# Patient Record
Sex: Female | Born: 1970 | Race: White | Hispanic: No | Smoking: Light tobacco smoker
Health system: Southern US, Community
[De-identification: ages and names within clinical notes are randomized; demographics above are authoritative.]

## PROBLEM LIST (undated history)

## (undated) DIAGNOSIS — Z800 Family history of malignant neoplasm of digestive organs: Secondary | ICD-10-CM

## (undated) DIAGNOSIS — F32A Depression, unspecified: Secondary | ICD-10-CM

## (undated) DIAGNOSIS — F419 Anxiety disorder, unspecified: Secondary | ICD-10-CM

## (undated) DIAGNOSIS — K589 Irritable bowel syndrome without diarrhea: Secondary | ICD-10-CM

## (undated) DIAGNOSIS — T753XXA Motion sickness, initial encounter: Secondary | ICD-10-CM

## (undated) DIAGNOSIS — K219 Gastro-esophageal reflux disease without esophagitis: Secondary | ICD-10-CM

## (undated) DIAGNOSIS — K602 Anal fissure, unspecified: Secondary | ICD-10-CM

## (undated) DIAGNOSIS — M797 Fibromyalgia: Secondary | ICD-10-CM

## (undated) DIAGNOSIS — Z923 Personal history of irradiation: Secondary | ICD-10-CM

## (undated) DIAGNOSIS — B373 Candidiasis of vulva and vagina: Secondary | ICD-10-CM

## (undated) DIAGNOSIS — Z808 Family history of malignant neoplasm of other organs or systems: Secondary | ICD-10-CM

## (undated) DIAGNOSIS — M1990 Unspecified osteoarthritis, unspecified site: Secondary | ICD-10-CM

## (undated) DIAGNOSIS — F329 Major depressive disorder, single episode, unspecified: Secondary | ICD-10-CM

## (undated) DIAGNOSIS — K8020 Calculus of gallbladder without cholecystitis without obstruction: Secondary | ICD-10-CM

## (undated) DIAGNOSIS — B3731 Acute candidiasis of vulva and vagina: Secondary | ICD-10-CM

## (undated) DIAGNOSIS — C50919 Malignant neoplasm of unspecified site of unspecified female breast: Secondary | ICD-10-CM

## (undated) HISTORY — DX: Anal fissure, unspecified: K60.2

## (undated) HISTORY — PX: ABDOMINAL HYSTERECTOMY: SHX81

## (undated) HISTORY — DX: Fibromyalgia: M79.7

## (undated) HISTORY — DX: Candidiasis of vulva and vagina: B37.3

## (undated) HISTORY — DX: Irritable bowel syndrome, unspecified: K58.9

## (undated) HISTORY — DX: Depression, unspecified: F32.A

## (undated) HISTORY — DX: Family history of malignant neoplasm of other organs or systems: Z80.8

## (undated) HISTORY — DX: Acute candidiasis of vulva and vagina: B37.31

## (undated) HISTORY — DX: Calculus of gallbladder without cholecystitis without obstruction: K80.20

## (undated) HISTORY — DX: Family history of malignant neoplasm of digestive organs: Z80.0

## (undated) HISTORY — DX: Malignant neoplasm of unspecified site of unspecified female breast: C50.919

## (undated) HISTORY — PX: BREAST BIOPSY: SHX20

## (undated) HISTORY — DX: Major depressive disorder, single episode, unspecified: F32.9

## (undated) HISTORY — PX: COSMETIC SURGERY: SHX468

## (undated) HISTORY — DX: Anxiety disorder, unspecified: F41.9

---

## 1998-04-23 HISTORY — PX: CHOLECYSTECTOMY: SHX55

## 2000-04-23 DIAGNOSIS — C50919 Malignant neoplasm of unspecified site of unspecified female breast: Secondary | ICD-10-CM

## 2000-04-23 HISTORY — PX: BREAST BIOPSY: SHX20

## 2000-04-23 HISTORY — DX: Malignant neoplasm of unspecified site of unspecified female breast: C50.919

## 2001-04-23 HISTORY — PX: BREAST LUMPECTOMY: SHX2

## 2008-12-03 DIAGNOSIS — G43909 Migraine, unspecified, not intractable, without status migrainosus: Secondary | ICD-10-CM | POA: Insufficient documentation

## 2009-04-22 DIAGNOSIS — E559 Vitamin D deficiency, unspecified: Secondary | ICD-10-CM | POA: Insufficient documentation

## 2010-06-29 ENCOUNTER — Ambulatory Visit: Payer: Self-pay | Admitting: Oncology

## 2010-07-23 ENCOUNTER — Ambulatory Visit: Payer: Self-pay | Admitting: Oncology

## 2010-11-07 ENCOUNTER — Ambulatory Visit: Payer: Self-pay | Admitting: Family Medicine

## 2011-04-04 ENCOUNTER — Ambulatory Visit: Payer: Self-pay | Admitting: Obstetrics & Gynecology

## 2011-04-10 ENCOUNTER — Ambulatory Visit: Payer: Self-pay | Admitting: Obstetrics & Gynecology

## 2011-04-10 HISTORY — PX: SUPRACERVICAL ABDOMINAL HYSTERECTOMY: SHX5393

## 2011-04-12 LAB — PATHOLOGY REPORT

## 2012-10-31 HISTORY — PX: COLECTOMY: SHX59

## 2013-08-08 ENCOUNTER — Other Ambulatory Visit: Payer: Self-pay | Admitting: Family Medicine

## 2013-08-08 LAB — CBC WITH DIFFERENTIAL/PLATELET
Basophil #: 0 10*3/uL (ref 0.0–0.1)
Basophil %: 0.8 %
Eosinophil #: 0.1 10*3/uL (ref 0.0–0.7)
Eosinophil %: 1.5 %
HCT: 39.8 % (ref 35.0–47.0)
HGB: 12.8 g/dL (ref 12.0–16.0)
Lymphocyte #: 1.3 10*3/uL (ref 1.0–3.6)
Lymphocyte %: 27.6 %
MCH: 29.2 pg (ref 26.0–34.0)
MCHC: 32.3 g/dL (ref 32.0–36.0)
MCV: 90 fL (ref 80–100)
Monocyte #: 0.5 x10 3/mm (ref 0.2–0.9)
Monocyte %: 10.4 %
Neutrophil #: 2.8 10*3/uL (ref 1.4–6.5)
Neutrophil %: 59.7 %
Platelet: 260 10*3/uL (ref 150–440)
RBC: 4.4 10*6/uL (ref 3.80–5.20)
RDW: 13.6 % (ref 11.5–14.5)
WBC: 4.6 10*3/uL (ref 3.6–11.0)

## 2013-11-10 ENCOUNTER — Ambulatory Visit: Payer: Self-pay | Admitting: Family Medicine

## 2013-12-04 ENCOUNTER — Ambulatory Visit: Payer: BC Managed Care – PPO | Admitting: Podiatry

## 2013-12-08 ENCOUNTER — Ambulatory Visit (INDEPENDENT_AMBULATORY_CARE_PROVIDER_SITE_OTHER): Payer: BC Managed Care – PPO

## 2013-12-08 ENCOUNTER — Ambulatory Visit (INDEPENDENT_AMBULATORY_CARE_PROVIDER_SITE_OTHER): Payer: BC Managed Care – PPO | Admitting: Podiatry

## 2013-12-08 ENCOUNTER — Encounter: Payer: Self-pay | Admitting: Podiatry

## 2013-12-08 VITALS — Ht 67.0 in | Wt 155.0 lb

## 2013-12-08 DIAGNOSIS — M779 Enthesopathy, unspecified: Secondary | ICD-10-CM

## 2013-12-08 MED ORDER — TRIAMCINOLONE ACETONIDE 10 MG/ML IJ SUSP
10.0000 mg | Freq: Once | INTRAMUSCULAR | Status: AC
  Administered 2013-12-08: 10 mg

## 2013-12-08 NOTE — Progress Notes (Signed)
   Subjective:    Patient ID: Gwendolyn [name], female    DOB: 13-Oct-1970, 43 y.o.   MRN: [id number]  HPI Comments: Both of my feet hurt on the bottom. They only hurt when i travel and do a lot of walking. This has been going on for 10 years. They have remained the same. i change my shoes and that's it. [place] for [place] feet.  Foot Pain      Review of Systems  Musculoskeletal:       Calf pain       Objective:   Physical Exam        Assessment & Plan:

## 2013-12-09 NOTE — Progress Notes (Signed)
Subjective:     Patient ID: Gwendolyn [name], female   DOB: 09/29/1970, 43 y.o.   MRN: [id number]  Foot Pain   patient presents stating I'm always had a lot of pain with my feet and the left one has really started to hurt me in the forefoot and I have to walk differently because of the pain. Patient states that it has been bothering her for several months and she [place] he did an injection but she's not sure what was done   Review of Systems  All other systems reviewed and are negative.      Objective:   Physical Exam  Nursing note and vitals reviewed. Constitutional: She is oriented to person, place, and time.  Cardiovascular: Intact distal pulses.   Musculoskeletal: Normal range of motion.  Neurological: She is oriented to person, place, and time.  Skin: Skin is warm.   neurovascular status intact muscle strength adequate with inflammation second MPJ left foot mild discomfort on the plantar heel and arch. Patient's found to have normal joint range of motion subtalar midtarsal joint mild equinus condition and depression of the arch upon weightbearing with well-perfused digits     Assessment:     Inflammatory changes with forefoot capsulitis left and probable plantar fasciitis of both feet    Plan:     H&P and x-rays reviewed. Proximal nerve block left aspirated the joint and I was able to get out a small amount of fluid and injected with half cc of dexamethasone Kenalog and applied padding. Reappoint for [place] to recheck one week and also scanned for orthotics today

## 2014-01-19 ENCOUNTER — Ambulatory Visit: Payer: BC Managed Care – PPO | Admitting: Podiatry

## 2014-01-22 ENCOUNTER — Encounter: Payer: Self-pay | Admitting: Podiatry

## 2014-01-22 ENCOUNTER — Ambulatory Visit (INDEPENDENT_AMBULATORY_CARE_PROVIDER_SITE_OTHER): Payer: BC Managed Care – PPO

## 2014-01-22 ENCOUNTER — Ambulatory Visit (INDEPENDENT_AMBULATORY_CARE_PROVIDER_SITE_OTHER): Payer: BC Managed Care – PPO | Admitting: Podiatry

## 2014-01-22 DIAGNOSIS — S9031XA Contusion of right foot, initial encounter: Secondary | ICD-10-CM

## 2014-01-22 DIAGNOSIS — M779 Enthesopathy, unspecified: Secondary | ICD-10-CM

## 2014-01-22 MED ORDER — TRIAMCINOLONE ACETONIDE 10 MG/ML IJ SUSP
10.0000 mg | Freq: Once | INTRAMUSCULAR | Status: AC
  Administered 2014-01-22: 10 mg

## 2014-01-24 NOTE — Progress Notes (Signed)
Subjective:     Patient ID: Gwendolyn [name], female   DOB: 1970-08-18, 43 y.o.   MRN: [id number]  HPI patient states she's doing really well with the left foot but the right foot has started to hurt her secondary to dropping a bite on the lateral side of the foot while on vacation   Review of Systems     Objective:   Physical Exam Neurovascular status intact with significant diminishment of pain left foot and pain in the right sinus tarsi that is quite tender when pressed    Assessment:     Possibility for sinus tarsitis right secondary to dramatic injury    Plan:     H&P and condition discussed. Reviewed x-rays and today carefully injected the sinus tarsi 3 mg Kenalog 5 mg Xylocaine and advised on reduced activity right foot

## 2014-02-05 ENCOUNTER — Encounter: Payer: Self-pay | Admitting: Podiatry

## 2014-02-05 ENCOUNTER — Ambulatory Visit (INDEPENDENT_AMBULATORY_CARE_PROVIDER_SITE_OTHER): Payer: BC Managed Care – PPO | Admitting: Podiatry

## 2014-02-05 DIAGNOSIS — S9031XA Contusion of right foot, initial encounter: Secondary | ICD-10-CM

## 2014-02-05 DIAGNOSIS — M779 Enthesopathy, unspecified: Secondary | ICD-10-CM

## 2014-02-05 NOTE — Progress Notes (Signed)
Subjective:     Patient ID: Gwendolyn [name], female   DOB: 04/24/1970, 43 y.o.   MRN: [id number]  HPI patient points to right foot stating it's much better and I'm able to walk without significant discomfort   Review of Systems     Objective:   Physical Exam Neurovascular status intact with discomfort of a mild nature in the sinus tarsi right with improvement from previous visit    Assessment:     Sinus tarsitis right that's improving    Plan:     Instructed on physical therapy for this stretching exercises ice therapy and supportive shoe. Discharge and less needs to be seen

## 2014-02-25 ENCOUNTER — Ambulatory Visit (INDEPENDENT_AMBULATORY_CARE_PROVIDER_SITE_OTHER): Payer: BC Managed Care – PPO

## 2014-02-25 ENCOUNTER — Ambulatory Visit (INDEPENDENT_AMBULATORY_CARE_PROVIDER_SITE_OTHER): Payer: BC Managed Care – PPO | Admitting: Podiatry

## 2014-02-25 VITALS — BP 97/63 | HR 80 | Resp 16

## 2014-02-25 DIAGNOSIS — L600 Ingrowing nail: Secondary | ICD-10-CM

## 2014-02-25 DIAGNOSIS — M2040 Other hammer toe(s) (acquired), unspecified foot: Secondary | ICD-10-CM

## 2014-02-25 DIAGNOSIS — M779 Enthesopathy, unspecified: Secondary | ICD-10-CM

## 2014-02-25 NOTE — Patient Instructions (Signed)
Ingrown Toenail An ingrown toenail occurs when the sharp edge of a toenail grows into the skin of the groove beside the nail (lateral nail groove), causing pain. Ingrown toenails most commonly occur on the first (big) toe. If left untreated, an ingrown toenail may become inflamed and infected. The infection can even spread throughout the toe. SYMPTOMS   Pain, centered on the nail groove.  Tenderness and inflammation.  Pus drainage (occasionally).  Signs of infection: redness, swelling, pain, warm to the touch. CAUSES  Ingrown toenails are caused when the toenail grows into the neighboring fleshy nail fold. This may be the result of poor nail trimming or pressure from shoes.  RISK INCREASES WITH:   Curved nail formations.  Clipping toenails too far on the sides, which allows tissue to grow over the nail.  Poorly fitted shoes, especially those that press down on the toenails.  Sports that require sudden stops, causing the toe to jam into the shoe. PREVENTION   Trim toenails properly, making sure the sides are not clipped too short.  Wear properly fitted shoes.  Avoid excessive pressure on the toenails. PROGNOSIS  Ingrown toenails are usually curable within 1 week, depending on the presence or absence of an infection.  RELATED COMPLICATIONS   Chronic infection, that cannot be cured without surgery.  Spread of infection throughout the toe, or even the bone. TREATMENT  Treatment first involves resting from aggravating activities, wearing shoes that do not place pressure on the toenails, antibiotics (if infected), and soaking the foot in warm water (with or without Epsom salt). After the foot has been soaked, you may be able to gently lift the nail from the fleshy tissue, and place a bit of cotton ball under the nail, easing the pressure. If you have been prescribed antibiotics, expect relief to begin up to 48 hours after taking the medicine. Symptoms usually go away within 1 week. For  people who suffer from persistent (chronic) ingrown toenails, surgery may be advised. MEDICATION   If pain medicine is needed, nonsteroidal anti-inflammatory medicines (aspirin and ibuprofen), or other minor pain relievers (acetaminophen), are often advised.  Do not take pain medicine for 7 days before surgery.  Stronger pain relievers may be prescribed, if your caregiver thinks they are needed. Use only as directed and only as much as you need.  Antibiotics may be prescribed to fight infection. Take as directed by your caregiver. Finish the entire prescription, even if you start to feel better before you finish it. SOAKS AND COLD THERAPY   Soak the toe (or whole foot) for 20 minutes, twice a day, in a gallon of warm water. You may add 2 tablespoons of Epsom salts or a mild detergent.  Cold treatment (icing) should be applied for 10 to 15 minutes every 2 to 3 hours for inflammation and pain, and immediately after activity that aggravates your symptoms. Use ice packs or an ice massage. SEEK MEDICAL CARE IF:   Symptoms get worse or do not improve in 48 hours, despite treatment.  You develop fever, increased pain and swelling, or signs of infection (pain, redness, tenderness, swelling, warmth) in the toe, after surgery.  New, unexplained symptoms develop. (Drugs used in treatment may produce side effects.) Document Released: 04/09/2005 Document Revised: 07/02/2011 Document Reviewed: 07/22/2008 Exit[employer] Patient Information 2015 [employer]. This information is not intended to replace advice given to you by your health care provider. Make sure you discuss any questions you have with your health care provider.  

## 2014-02-28 NOTE — Progress Notes (Signed)
Patient ID: Gwendolyn [name], female   DOB: [birth year], 43 y.o.   MRN: [id number]  Subjective: Gwendolyn [name] presents to the office today with complaints of right 3rd and 4th digit pain. She states the pain has been present over the last several days, but has increased today. She has had no prior treatment. Pain increases with pressure over the area. She denies any history of injury or trauma to the area. Denies any systemic complaints as fevers, chills, nausea, vomiting. No other complaints at this time. No acute changes since last appointment.  Objective: AAO x3, NAD DP/PT pulses palpable bilaterally, CRT less than 3 seconds Protective sensation intact with Simms Weinstein monofilament, vibratory sensation intact, Achilles tendon reflex intact Tenderness palpation over the lateral aspect of the right third digit along the distal lateral aspect of the nail. There is no evidence of incurvation along the nail proximally however there is a small amount distally. There is no drainage, purulence, ascending cellulitis. Also tenderness on the adjacent fourth medial digit distal nail. There is no evidence of purulence or drainage or ascending cellulitis. Mild amount of incurvation of the nail border.  Adductovarus position of the digits with hammertoe contractures present. There is no pinpoint bony tenderness or pain with vibratory sensation. Range of motion of the PIPJ, MTPJ's within normal limits and without discomfort. MMT 5/5, ROM WNL.  No open lesions. No calf pain, swelling, warmth, erythema  Assessment: 43 year old female with right third and fourth digit pain mostly localized to the lateral distal third digit nail in medial fourth digit nail without clinical signs of infection.  Plan: -Conservative versus surgical treatment discussed including alternatives, risks, complications. -Nail borders sharply debrided without complications to patient comfort. Upon debridement of the nails there  was noted to be improvement in symptoms. Discussed with the patient symptoms persist she may need partial nail avulsions to remove the nail borders. Etiology likely due to digital deformities. -Dispensed offloading pads. -Monitor for any clinical signs or symptoms of infection and directed to call the office immediately if any are to occur or go directly to the emergency room. -follow-up as needed. Call the office with any questions, concerns, change in symptoms.

## 2014-03-25 ENCOUNTER — Ambulatory Visit (INDEPENDENT_AMBULATORY_CARE_PROVIDER_SITE_OTHER): Payer: BC Managed Care – PPO

## 2014-03-25 ENCOUNTER — Ambulatory Visit (INDEPENDENT_AMBULATORY_CARE_PROVIDER_SITE_OTHER): Payer: BC Managed Care – PPO | Admitting: Podiatry

## 2014-03-25 VITALS — BP 95/54 | HR 89 | Resp 16

## 2014-03-25 DIAGNOSIS — M779 Enthesopathy, unspecified: Secondary | ICD-10-CM

## 2014-03-25 DIAGNOSIS — M792 Neuralgia and neuritis, unspecified: Secondary | ICD-10-CM

## 2014-03-29 NOTE — Progress Notes (Signed)
Patient ID: Gwendolyn [name], female   DOB: 1970/06/19, 43 y.o.   MRN: [id number]  Subjective: 43 year old female returns the office today with new complaints. She states that over the last 2 weeks she has noticed numbness to her first and second digits. She points to the lateral aspect of the hallux and the medial aspect of the second digit. She denies any recent injury or trauma to the area. She was waiting to see if the area had resolved and symptoms however she states this persistently been numb. No acute changes since last appointment. No other complaints at this time.  Objective: AAO 3, NAD DP/PT pulses palpable bilaterally, CRT less than 3 seconds Protective sensation intact with Simms Weinstein monofilament, vibratory sensation intact, Achilles tendon reflex intact. Subjectively there is decreased sensation on the medial aspect of the second digit and lateral aspect of the hallux and along the distal aspect of the first interspace dorsally. There is no reproduction of symptoms upon palpation on the anterior aspect of the ankle. Sensation appears to be intact on other areas of the foot. There is no palpable neuroma identified however there is mild tenderness to palpation along the first interspace at approximately the level of the area with the numbness starts. There is no reproduction of symptoms lateral compression of metatarsals. There is no overlying edema, erythema, increase in warmth. There is no pinpoint bony tenderness or pain with vibratory sensation. No open lesions or pre-ulcerative lesions.  No pain with calf compression, swelling, warmth, erythema. 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 -Treatment options were discussed including alternatives, risks, complications. At this time due to the discomfort upon palpation of the first interspace and this corresponds the area  over the numbness starts could possibly be a neuroma. Discussed with the patient various etiologies. Patient like to proceed with a steroid injection into the area to see if this will help decrease some of her symptoms. Risks, complications were discussed with the patient for which she understood verbally consents to the injection. Under sterile conditions a total of 1 mL of dexamethasone phosphate was infiltrated into the first interspace. Patient tolerated the procedure well without any complications. Post care instructions were discussed the patient for which she verbally understood. -Follow-up in 2 weeks or sooner if any problems are to arise. In the meantime, call the office any questions, concerns, change in symptoms. If symptoms persist or worsen we'll likely refer to neurology.

## 2014-04-08 ENCOUNTER — Encounter: Payer: Self-pay | Admitting: Podiatry

## 2014-04-08 ENCOUNTER — Ambulatory Visit (INDEPENDENT_AMBULATORY_CARE_PROVIDER_SITE_OTHER): Payer: BC Managed Care – PPO | Admitting: Podiatry

## 2014-04-08 VITALS — BP 98/64 | HR 83 | Resp 16

## 2014-04-08 DIAGNOSIS — M792 Neuralgia and neuritis, unspecified: Secondary | ICD-10-CM

## 2014-04-08 NOTE — Progress Notes (Signed)
Patient ID: Gwendolyn [name], female   DOB: 01-24-1971, 43 y.o.   MRN: [id number]  Subjective: 43 year old female presents the office they for follow-up evaluation of deep peroneal nerve neuritis. At last appointment she had an injection into the first interspace at the area of symptoms. She had some relief of symptoms for couple of days and then states that the symptoms have worsened. She states that she puts pressure on the front of her ankle and she has radiation of nerve pain into the big and second toe. She denies any recent injury or trauma to the area. No other complaints at this time. She is inquiring about a possible neurology evaluation.  Objective: AAO 3, NAD DP/PT pulses palpable, CRT less than 3 seconds Protective sensation intact with Simms once the monofilament, vibratory sensation intact there is subjective numbness overlying the medial aspect of the second digit and the lateral aspect of the hallux.There is reproduction of symptoms upon percussion overlying the anterior aspect the ankle overlying the deep peroneal nerve.  There is no areas of pinpoint bony tenderness or pain with vibratory sensation.  No overlying edema, erythema, increase in warmth to the foot/ankle.  No pain with calf compression, swelling, warmth, erythema.  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 -Patient was inquiring a possible neurology evaluation. At this time as the nerve symptoms have worsened we'll refer to neurology. Discussed with her likely anterior tarsal tunnel of the deep peroneal nerve is likely inflamed. There is no history of injury or trauma to the area. Discussed possible steroid injection into the anterior ankle area.  -Follow-up after neurology evaluation or sooner if any problems are to  arise. In the meantime, call the office with any questions, concerns, changes symptoms.

## 2014-04-09 ENCOUNTER — Telehealth: Payer: Self-pay | Admitting: *Deleted

## 2014-04-09 NOTE — Telephone Encounter (Signed)
Called and left message asking pt to return call to see when she can go to the neurologist - anyday/time?

## 2014-04-13 ENCOUNTER — Telehealth: Payer: Self-pay | Admitting: *Deleted

## 2014-04-13 NOTE — Telephone Encounter (Signed)
Spoke with Gwendolyn [name] letting her know appt with dr Manuella Ghazi at [facility] 1.26.16 8:30 am

## 2014-04-13 NOTE — Telephone Encounter (Signed)
Returning Gwendolyn [name]'s call concerning referral appt.

## 2014-05-11 ENCOUNTER — Ambulatory Visit (INDEPENDENT_AMBULATORY_CARE_PROVIDER_SITE_OTHER): Payer: BC Managed Care – PPO | Admitting: Podiatry

## 2014-05-11 VITALS — BP 99/60 | HR 105 | Resp 16

## 2014-05-11 DIAGNOSIS — M792 Neuralgia and neuritis, unspecified: Secondary | ICD-10-CM

## 2014-05-13 NOTE — Progress Notes (Signed)
Patient ID: Gwendolyn [name], female   DOB: August 04, 1970, 44 y.o.   MRN: [id number]  Subjective: 44 year old female presents the office today requesting a steroid injection into her right foot. She has not yet seen neurologist. She does state she continues to have numbness in between her first and second toe shooting into the front of her ankle. Today, she also states that she has some nervelike symptoms in the back of her leg on the right side. She states that the injection previously did help relieve some of her symptoms and she is going on vacation and would like to have some relief. She has an appointment a follow-up with neurology next week. No other complaints at this time. No acute changes since last appointment.  Objective: AAO 3, NAD DP/PT pulses palpable, CRT less than 3 seconds Protective sensation appears to be intact with Gwendolyn [name] monofilament, vibratory sensation intact, Achilles tendon reflex intact. There is subjective numbness along the lateral aspect of the hallux and along the medial aspect of the second digit. There is reproduction of symptoms upon palpation over the anterior aspect of the ankle into the first interspace. There is no areas of pinpoint bony tenderness or pain with vibratory sensation. There is no overlying edema, erythema, increase in warmth. MMT 5/5, ROM WNL No open lesions or pre-ulcer lesions identified. No pain with calf compression, swelling, warmth, erythema.  Assessment: 44 year old female with likely right deep peroneal nerve neuritis  Plan: -Treatment options were discussed including alternatives, risks, complications. -At this time the patient is requesting another steroid injection into the central area in the first interspace. Under sterile conditions a total 1.5 mL of a mixture of dexamethasone, 0.5% Marcaine plain was infiltrated into the area of the first interspace. A Band-Aid was applied. Patient tolerated the injection well without  complications. Post injection care was discussed the patient. -Recommended patient distal follow-up in neurology given the worsening of the nerve symptoms in the foot and also the new history of nervelike symptoms in her leg. -Follow-up after neurology evaluation or sooner should any problems arise. In the meantime, encouraged to call the office with any questions, concerns, change in symptoms.

## 2014-06-01 ENCOUNTER — Ambulatory Visit: Payer: Self-pay | Admitting: Neurology

## 2014-07-13 ENCOUNTER — Ambulatory Visit: Payer: Self-pay | Admitting: Neurology

## 2014-10-05 DIAGNOSIS — Z853 Personal history of malignant neoplasm of breast: Secondary | ICD-10-CM | POA: Insufficient documentation

## 2014-10-05 DIAGNOSIS — L0390 Cellulitis, unspecified: Secondary | ICD-10-CM | POA: Insufficient documentation

## 2014-10-05 DIAGNOSIS — E785 Hyperlipidemia, unspecified: Secondary | ICD-10-CM | POA: Insufficient documentation

## 2014-10-05 DIAGNOSIS — F3181 Bipolar II disorder: Secondary | ICD-10-CM | POA: Insufficient documentation

## 2014-10-05 DIAGNOSIS — Z9049 Acquired absence of other specified parts of digestive tract: Secondary | ICD-10-CM | POA: Insufficient documentation

## 2014-10-05 DIAGNOSIS — F419 Anxiety disorder, unspecified: Secondary | ICD-10-CM | POA: Insufficient documentation

## 2014-10-05 DIAGNOSIS — G4710 Hypersomnia, unspecified: Secondary | ICD-10-CM | POA: Insufficient documentation

## 2014-10-05 DIAGNOSIS — M797 Fibromyalgia: Secondary | ICD-10-CM | POA: Insufficient documentation

## 2014-10-05 DIAGNOSIS — N816 Rectocele: Secondary | ICD-10-CM

## 2014-10-05 DIAGNOSIS — F32A Depression, unspecified: Secondary | ICD-10-CM | POA: Insufficient documentation

## 2014-10-05 DIAGNOSIS — F329 Major depressive disorder, single episode, unspecified: Secondary | ICD-10-CM

## 2014-10-05 DIAGNOSIS — G4733 Obstructive sleep apnea (adult) (pediatric): Secondary | ICD-10-CM | POA: Insufficient documentation

## 2014-10-05 DIAGNOSIS — K5909 Other constipation: Secondary | ICD-10-CM

## 2014-10-05 DIAGNOSIS — E7800 Pure hypercholesterolemia, unspecified: Secondary | ICD-10-CM | POA: Insufficient documentation

## 2014-10-05 DIAGNOSIS — G4700 Insomnia, unspecified: Secondary | ICD-10-CM | POA: Insufficient documentation

## 2014-10-05 DIAGNOSIS — K589 Irritable bowel syndrome without diarrhea: Secondary | ICD-10-CM | POA: Insufficient documentation

## 2014-10-05 DIAGNOSIS — E559 Vitamin D deficiency, unspecified: Secondary | ICD-10-CM | POA: Insufficient documentation

## 2014-10-05 DIAGNOSIS — K469 Unspecified abdominal hernia without obstruction or gangrene: Secondary | ICD-10-CM | POA: Insufficient documentation

## 2014-10-05 DIAGNOSIS — G43001 Migraine without aura, not intractable, with status migrainosus: Secondary | ICD-10-CM | POA: Insufficient documentation

## 2014-10-05 DIAGNOSIS — F988 Other specified behavioral and emotional disorders with onset usually occurring in childhood and adolescence: Secondary | ICD-10-CM | POA: Insufficient documentation

## 2014-10-05 DIAGNOSIS — G4761 Periodic limb movement disorder: Secondary | ICD-10-CM | POA: Insufficient documentation

## 2014-10-06 ENCOUNTER — Telehealth: Payer: Self-pay | Admitting: Family Medicine

## 2014-10-06 ENCOUNTER — Ambulatory Visit (INDEPENDENT_AMBULATORY_CARE_PROVIDER_SITE_OTHER): Payer: BC Managed Care – PPO | Admitting: Physician Assistant

## 2014-10-06 ENCOUNTER — Encounter: Payer: Self-pay | Admitting: Physician Assistant

## 2014-10-06 VITALS — BP 106/70 | HR 84 | Temp 97.9°F | Resp 16 | Wt 172.4 lb

## 2014-10-06 DIAGNOSIS — R110 Nausea: Secondary | ICD-10-CM | POA: Diagnosis not present

## 2014-10-06 DIAGNOSIS — E559 Vitamin D deficiency, unspecified: Secondary | ICD-10-CM | POA: Diagnosis not present

## 2014-10-06 DIAGNOSIS — R5383 Other fatigue: Secondary | ICD-10-CM

## 2014-10-06 DIAGNOSIS — R635 Abnormal weight gain: Secondary | ICD-10-CM

## 2014-10-06 DIAGNOSIS — Z9889 Other specified postprocedural states: Secondary | ICD-10-CM | POA: Diagnosis not present

## 2014-10-06 DIAGNOSIS — G4733 Obstructive sleep apnea (adult) (pediatric): Secondary | ICD-10-CM | POA: Insufficient documentation

## 2014-10-06 DIAGNOSIS — R631 Polydipsia: Secondary | ICD-10-CM

## 2014-10-06 DIAGNOSIS — Z9049 Acquired absence of other specified parts of digestive tract: Secondary | ICD-10-CM

## 2014-10-06 DIAGNOSIS — K649 Unspecified hemorrhoids: Secondary | ICD-10-CM | POA: Insufficient documentation

## 2014-10-06 DIAGNOSIS — K644 Residual hemorrhoidal skin tags: Secondary | ICD-10-CM | POA: Insufficient documentation

## 2014-10-06 DIAGNOSIS — Z23 Encounter for immunization: Secondary | ICD-10-CM | POA: Insufficient documentation

## 2014-10-06 NOTE — Progress Notes (Signed)
Subjective:    Patient ID: Gwendolyn [name], female    DOB: May 30, 1970, 44 y.o.   MRN: [id number]  HPI   Gwendolyn [name] is a 44 year old female that presents to the office today for evaluation of nausea, weight gain, and fatigue that has been occurring since around December 2015.  The symptoms have been occurring intermittently. The most worrisome symptom for her is the weight gain and fatigue.  She states she has been working out regularly and has still put on a couple of pounds and feels that she is holding it all around her middle abdomen at this time.  Her psychiatrist had mentioned to her to be checked for insulin resistance due to the weight gain along her mid section and her fatigue. She also does report an increased thirst stating she always feels like she is thirsty and cannot quench her thirst. The fatigue has been very bothersome for her as well. 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 She reports that she does not sleep well and has had long standing insomnia. She does take Ambien 10 mg nightly to help her sleep and reports good compliance with this treatment.    Review of Systems  Constitutional: Positive for activity change (fatigue keeps her from normal activity), fatigue and unexpected weight change (weight gain). Negative for fever, chills, diaphoresis and appetite change.  HENT: Negative.   Eyes: Negative.   Respiratory: Negative for cough, choking, chest tightness, shortness of breath and wheezing.   Cardiovascular: Negative for chest pain, palpitations and leg swelling.  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 Negative for vomiting, abdominal pain, constipation, blood in stool, abdominal distention and rectal pain.  Endocrine: Positive for polydipsia. Negative  for cold intolerance, heat intolerance, polyphagia and polyuria.  Genitourinary: Negative for dysuria, urgency, frequency, hematuria, flank pain, decreased urine volume, vaginal bleeding, vaginal discharge, vaginal pain, menstrual problem and pelvic pain.  Musculoskeletal: Positive for myalgias (fibromyalgia) and arthralgias (fibromyalgia). Negative for back pain, joint swelling, gait problem and neck stiffness.  Skin: Negative for color change and rash.  Allergic/Immunologic: Negative for environmental allergies, food allergies and immunocompromised state.  Neurological: Positive for dizziness (mild) and numbness (right great toe). Negative for tremors, seizures, syncope, facial asymmetry, speech difficulty, weakness, light-headedness and headaches.  Hematological: Negative for adenopathy. Does not bruise/bleed easily.  Psychiatric/Behavioral: Positive for sleep disturbance. Negative for suicidal ideas, hallucinations, behavioral problems, confusion, self-injury, dysphoric mood, decreased concentration and agitation. The patient is not nervous/anxious and is not hyperactive.        Objective:   Physical Exam  Constitutional: She appears well-developed and well-nourished. No distress.  HENT:  Head: Normocephalic and atraumatic.  Right Ear: Hearing, tympanic membrane, external ear and ear canal normal.  Left Ear: Hearing, tympanic membrane, external ear and ear canal normal.  Nose: Nose normal.  Mouth/Throat: Uvula is midline, oropharynx is clear and moist and mucous membranes are normal. No oropharyngeal exudate.  Eyes: Conjunctivae are normal. Pupils are equal, round, and reactive to light. Right eye exhibits no discharge. Left eye exhibits no discharge.  Neck: Normal range of motion. Neck supple. No tracheal deviation present. No thyromegaly present.  Cardiovascular: Normal rate, regular rhythm, normal heart sounds and intact distal pulses.  Exam reveals no gallop and no friction rub.   No  murmur heard.  Pulmonary/Chest: Effort normal and breath sounds normal. No respiratory distress. She has no wheezes. She has no rales. She exhibits no tenderness.  Abdominal: Soft. Bowel sounds are normal. She exhibits no distension and no mass. There is no tenderness. There is no rebound and no guarding.  Lymphadenopathy:    She has no cervical adenopathy.  Neurological: She is alert.  Skin: Skin is warm and dry. No rash noted. She is not diaphoretic.  Psychiatric: She has a normal mood and affect. Her behavior is normal. Judgment and thought content normal.  Vitals reviewed.         Assessment & Plan:  1. Other fatigue Will check labs.  F/U pending labs. - TSH - CBC w/Diff - B12 - Antinuclear Antib (ANA) - Comprehensive Metabolic Panel (CMET)  2. Nausea Will check labs.  F/U pending labs - HgB A1c  3. Polydipsia Will check labs.  F/U pending labs. - HgB A1c  4. Vitamin D deficiency H/O this.  Not on treatment currently.  Will check labs.  F/U pending labs. - Vitamin D (25 hydroxy)  5. Weight gain States to be working out daily.  Will check labs.  F/U pending labs. - HgB A1c - TSH - B12 - Lipid Profile - Comprehensive Metabolic Panel (CMET)  6. H/O colectomy Possible malabsorption?  Will check labs.  F/U pending labs. - B12

## 2014-10-06 NOTE — Telephone Encounter (Signed)
Please schedule. Thanks! 

## 2014-10-06 NOTE — Telephone Encounter (Signed)
Gwendolyn [name] needs to have her mammogram scheduled at [place].  She said she has a hx of breast CA and needs diagnostic done.

## 2014-10-06 NOTE — Patient Instructions (Signed)
Chronic Fatigue Syndrome Chronic fatigue syndrome (CFS) is a condition in which there is lasting, extreme tiredness (fatigue) that does not improve with rest. CFS affects women up to four times more often than men. If you have CFS, fatigue and other symptoms can make it hard for you to get through your day. There is no treatment or cure. You will need to work closely with your health care provider to come up with a treatment plan that works for you. CAUSES  No one knows what causes CFS. It may be triggered by a flu-like illness or by mono. Other triggers may include:  An abnormal immune system.  Low blood pressure.  Poor diet.  Physical or emotional stress. SIGNS AND SYMPTOMS The main symptom is fatigue that lasts all day, especially after physical or mental stress. Other common symptoms include:  An extreme loss of energy with no obvious cause.  Muscle or joint soreness.  Severe weakness.  Frequent headaches.  Fever.  Sore throat.  Swollen lymph glands.  Sleep is not refreshing.  Loss of concentration or memory. Less common symptoms may include:  Chills.  Night sweats.  Tingling or numbness.  Blurred vision.  Dizziness.  Sensitivity to noise or odors.  Mood swings.  Anxiety, panic attacks, and depression. Your symptoms may come and go, or you may have them all the time. DIAGNOSIS  There are no tests that can help health care providers diagnose CFS. It may take a long time for you to get a correct diagnosis. Your health care provider may need to do a number of tests to rule out other conditions that could be causing your symptoms. You may be diagnosed with CFS if:  You have fatigue that has lasted for at least six months.  Your fatigue is not relieved by rest.  Your fatigue is not caused by another condition.  Your fatigue is severe enough to interfere with work and daily activities.  You have at least four common symptoms of CFS. TREATMENT  There is no  cure for CFS at this time. The condition affects everyone differently. You will need to work with your health care provider to find the best treatment for your symptoms. Treatment may include:  Improving sleep with a regular bedtime routine.  Avoiding caffeine, alcohol, and tobacco.  Doing light exercise and stretching during the day.  Taking medicine to help you sleep.  Taking over-the-counter medicines to relieve joint or muscle pain.  Learning and practicing relaxation techniques.  Using memory aids or doing brain teasers to improve memory and concentration.  Seeing a mental health professional to evaluate and treat depression, if necessary.  Trying massage therapy, acupuncture, and movement exercises, like yoga or tai chi. HOME CARE INSTRUCTIONS Work closely with your health care provider to follow your treatment plan at home. You may need to make major lifestyle changes. If treatment does not seem to help, get a second opinion. You may get help from many health care providers, including doctors, mental health specialists, physical therapists, and rehabilitation therapists. Having the support of friends and loved ones is also important. SEEK MEDICAL CARE IF:  Your symptoms are not responding to treatment.  You are having strong feelings of anger, guilt, anxiety, or depression. Document Released: 05/17/2004 Document Revised: 08/24/2013 Document Reviewed: 02/27/2013 ExitCare Patient Information 2015 [employer]. This information is not intended to replace advice given to you by your health care provider. Make sure you discuss any questions you have with your health care provider.  

## 2014-10-08 ENCOUNTER — Telehealth: Payer: Self-pay

## 2014-10-08 LAB — LIPID PANEL
Chol/HDL Ratio: 3 ratio units (ref 0.0–4.4)
Cholesterol, Total: 206 mg/dL — ABNORMAL HIGH (ref 100–199)
HDL: 68 mg/dL (ref >39)
LDL CALC: 115 mg/dL — AB (ref 0–99)
Triglycerides: 113 mg/dL (ref 0–149)
VLDL Cholesterol Cal: 23 mg/dL (ref 5–40)

## 2014-10-08 LAB — HEMOGLOBIN A1C
Est. average glucose Bld gHb Est-mCnc: 111 mg/dL
Hgb A1c MFr Bld: 5.5 % (ref 4.8–5.6)

## 2014-10-08 LAB — COMPREHENSIVE METABOLIC PANEL
A/G RATIO: 1.7 (ref 1.1–2.5)
ALK PHOS: 32 IU/L — AB (ref 39–117)
ALT: 18 IU/L (ref 0–32)
AST: 21 IU/L (ref 0–40)
Albumin: 4.3 g/dL (ref 3.5–5.5)
BUN/Creatinine Ratio: 12 (ref 9–23)
BUN: 9 mg/dL (ref 6–24)
Bilirubin Total: 0.6 mg/dL (ref 0.0–1.2)
CALCIUM: 9.1 mg/dL (ref 8.7–10.2)
CO2: 26 mmol/L (ref 18–29)
CREATININE: 0.78 mg/dL (ref 0.57–1.00)
Chloride: 101 mmol/L (ref 97–108)
GFR calc Af Amer: 106 mL/min/{1.73_m2} (ref >59)
GFR, EST NON AFRICAN AMERICAN: 92 mL/min/{1.73_m2} (ref >59)
GLOBULIN, TOTAL: 2.5 g/dL (ref 1.5–4.5)
Glucose: 95 mg/dL (ref 65–99)
Potassium: 4.2 mmol/L (ref 3.5–5.2)
SODIUM: 141 mmol/L (ref 134–144)
Total Protein: 6.8 g/dL (ref 6.0–8.5)

## 2014-10-08 LAB — CBC WITH DIFFERENTIAL/PLATELET
BASOS: 0 %
Basophils Absolute: 0 10*3/uL (ref 0.0–0.2)
EOS (ABSOLUTE): 0.1 10*3/uL (ref 0.0–0.4)
EOS: 1 %
HEMOGLOBIN: 12.5 g/dL (ref 11.1–15.9)
Hematocrit: 37.9 % (ref 34.0–46.6)
Immature Grans (Abs): 0 10*3/uL (ref 0.0–0.1)
Immature Granulocytes: 0 %
LYMPHS ABS: 1.4 10*3/uL (ref 0.7–3.1)
Lymphs: 24 %
MCH: 29.6 pg (ref 26.6–33.0)
MCHC: 33 g/dL (ref 31.5–35.7)
MCV: 90 fL (ref 79–97)
Monocytes Absolute: 0.4 10*3/uL (ref 0.1–0.9)
Monocytes: 7 %
Neutrophils Absolute: 4 10*3/uL (ref 1.4–7.0)
Neutrophils: 68 %
Platelets: 272 10*3/uL (ref 150–379)
RBC: 4.22 x10E6/uL (ref 3.77–5.28)
RDW: 13.7 % (ref 12.3–15.4)
WBC: 5.9 10*3/uL (ref 3.4–10.8)

## 2014-10-08 LAB — VITAMIN D 25 HYDROXY (VIT D DEFICIENCY, FRACTURES): VIT D 25 HYDROXY: 33.8 ng/mL (ref 30.0–100.0)

## 2014-10-08 LAB — B12 AND FOLATE PANEL
Folate: >20 ng/mL (ref >3.0)
VITAMIN B 12: 609 pg/mL (ref 211–946)

## 2014-10-08 LAB — TSH: TSH: 1.58 u[IU]/mL (ref 0.450–4.500)

## 2014-10-08 LAB — T4: T4 TOTAL: 7.6 ug/dL (ref 4.5–12.0)

## 2014-10-08 LAB — ANA: Anti Nuclear Antibody(ANA): NEGATIVE

## 2014-10-08 NOTE — Telephone Encounter (Signed)
Patient scheduled for 12/28/14 at 1:30pm. Gwendolyn [name] will try to schedule sooner if possible for the summer.

## 2014-10-08 NOTE — Telephone Encounter (Signed)
Patient advised as directed below. Patient verbalized understanding.  

## 2014-10-08 NOTE — Telephone Encounter (Signed)
-----   Message from Mar Daring, PA-C sent at 10/08/2014  8:13 AM EDT ----- Thus far all labs are WNL including TSH, HgBA1c, Vit D, folate, CMP and CBC.  B12 and ANA are still pending.

## 2014-10-11 ENCOUNTER — Telehealth: Payer: Self-pay

## 2014-10-11 NOTE — Telephone Encounter (Signed)
Patient advised as directed below. Patient states she will call back to schedule a follow up appointment.

## 2014-10-11 NOTE — Telephone Encounter (Signed)
Patient advised as directed below. Patient wants to know what the next step would be. Patient states she is still having all the same symptoms. Patient also wanted to know when she needs to follow up with you again. Please advise.

## 2014-10-11 NOTE — Telephone Encounter (Signed)
-----   Message from Mar Daring, [place] sent at 10/08/2014  5:09 PM EDT ----- ANA is negative.  Thanks! -JB

## 2014-10-11 NOTE — Telephone Encounter (Signed)
Yes just have her schedule another appointment with me so we can discuss other options to try to see what is going on.  Thanks! JB

## 2014-11-19 ENCOUNTER — Encounter: Payer: Self-pay | Admitting: Family Medicine

## 2014-11-19 ENCOUNTER — Ambulatory Visit (INDEPENDENT_AMBULATORY_CARE_PROVIDER_SITE_OTHER): Payer: BC Managed Care – PPO | Admitting: Family Medicine

## 2014-11-19 VITALS — BP 110/60 | HR 74 | Temp 98.2°F | Resp 16 | Ht 66.0 in | Wt 178.4 lb

## 2014-11-19 DIAGNOSIS — N898 Other specified noninflammatory disorders of vagina: Secondary | ICD-10-CM

## 2014-11-19 DIAGNOSIS — L298 Other pruritus: Secondary | ICD-10-CM

## 2014-11-19 DIAGNOSIS — B3731 Acute candidiasis of vulva and vagina: Secondary | ICD-10-CM

## 2014-11-19 DIAGNOSIS — B373 Candidiasis of vulva and vagina: Secondary | ICD-10-CM | POA: Diagnosis not present

## 2014-11-19 LAB — POCT WET PREP (WET MOUNT)
CLUE CELLS WET PREP WHIFF POC: NEGATIVE
TRICHOMONAS WET PREP HPF POC: NEGATIVE
WBC, Wet Prep HPF POC: NEGATIVE

## 2014-11-19 MED ORDER — TERCONAZOLE 0.4 % VA CREA: 1.0000 | TOPICAL_CREAM | Freq: Every day | VAGINAL | Status: DC

## 2014-11-19 NOTE — Progress Notes (Signed)
       Patient: Gwendolyn [name] Female    DOB: [birth year]   44 y.o.   MRN: [id number] Visit Date: 11/19/2014  Today's Provider: Margarita Rana, MD   Chief Complaint  Patient presents with  . Vaginal Itching    for two days  . Vaginal Discharge    Clear   Subjective:    HPI    Patient here with concerns of Vaginal Itching for two days and vaginal discharge for two days but is clear. No bad odor, no color to the discharge.     Allergies  Allergen Reactions  . Amoxicillin-Pot Clavulanate     Other reaction(s): NAUSEA (Augmentin)  . Codeine Other (See Comments)    hallucinations  . Morphine Other (See Comments)  . Penicillins     nausea, diarrhea   Previous Medications   AMPHETAMINE-DEXTROAMPHETAMINE (ADDERALL XR) 20 MG 24 HR CAPSULE    Take 20 mg by mouth daily.   DULOXETINE (CYMBALTA) 30 MG CAPSULE    Take 30 mg by mouth daily.   GABAPENTIN (NEURONTIN) 800 MG TABLET    TAKE 1 TABLET (800 MG TOTAL) BY MOUTH NIGHTLY.   GINKGO BILOBA 40 MG TABS    Take 1 tablet by mouth daily.   HYDROCORTISONE (ANUSOL-HC) 25 MG SUPPOSITORY    INSERT 1 SUPPOSITORY RECTALLY TWICE A DAY TO 3 TIMES A DAY AS NEEDED   MELATONIN 3 MG TABS    Take 1 tablet by mouth at bedtime.   TIAGABINE (GABITRIL) 4 MG TABLET    Take 4 mg by mouth 2 (two) times daily.   ZOLPIDEM (AMBIEN) 10 MG TABLET    Take 10 mg by mouth at bedtime as needed for sleep.    Review of Systems  Constitutional: Negative.   HENT: Negative.   Eyes: Negative.   Respiratory: Negative.   Cardiovascular: Negative.   Gastrointestinal: Negative.   Endocrine: Negative.   Genitourinary: Positive for vaginal discharge (for two days).  Allergic/Immunologic: Negative.   Neurological: Negative.   Hematological: Negative.   Psychiatric/Behavioral: Negative.     History  Substance Use Topics  . Smoking status: Former Smoker -- 20 years  . Smokeless tobacco: Never Used     Comment: QUIT IN 2004  . Alcohol Use: 0.0 oz/week    0  Standard drinks or equivalent per week     Comment: OCCASIONALLY   Objective:   BP 110/60 mmHg  Pulse 74  Temp(Src) 98.2 F (36.8 C) (Oral)  Resp 16  Ht 5\' 6"  (1.676 m)  Wt 178 lb 6.4 oz (80.922 kg)  BMI 28.81 kg/m2  Physical Exam  Constitutional: She is oriented to person, place, and time. She appears well-developed and well-nourished.  Genitourinary: Uterus normal. Vaginal discharge (white) found.  Neurological: She is alert and oriented to person, place, and time.      Assessment & Plan:     1. Vaginal itching Suspect related to candidiasis. Wet prep positive KOH.  - POCT Wet Prep [facility])  2. Vaginal candidiasis KOH positive. Will treat with Terazol and recheck as needed.      Margarita Rana, MD  [facility]

## 2015-01-18 ENCOUNTER — Ambulatory Visit (INDEPENDENT_AMBULATORY_CARE_PROVIDER_SITE_OTHER): Payer: BC Managed Care – PPO | Admitting: Family Medicine

## 2015-01-18 ENCOUNTER — Encounter: Payer: Self-pay | Admitting: Family Medicine

## 2015-01-18 VITALS — BP 100/60 | HR 78 | Temp 98.5°F | Resp 16 | Ht 66.0 in | Wt 179.0 lb

## 2015-01-18 DIAGNOSIS — R319 Hematuria, unspecified: Secondary | ICD-10-CM

## 2015-01-18 DIAGNOSIS — B373 Candidiasis of vulva and vagina: Secondary | ICD-10-CM | POA: Diagnosis not present

## 2015-01-18 DIAGNOSIS — Z780 Asymptomatic menopausal state: Secondary | ICD-10-CM | POA: Diagnosis not present

## 2015-01-18 DIAGNOSIS — Z23 Encounter for immunization: Secondary | ICD-10-CM

## 2015-01-18 DIAGNOSIS — Z0000 Encounter for general adult medical examination without abnormal findings: Secondary | ICD-10-CM

## 2015-01-18 DIAGNOSIS — B3731 Acute candidiasis of vulva and vagina: Secondary | ICD-10-CM

## 2015-01-18 DIAGNOSIS — Z124 Encounter for screening for malignant neoplasm of cervix: Secondary | ICD-10-CM | POA: Diagnosis not present

## 2015-01-18 LAB — POCT URINALYSIS DIPSTICK
Bilirubin, UA: NEGATIVE
CLARITY UA: NEGATIVE
Glucose, UA: NEGATIVE
KETONES UA: NEGATIVE
LEUKOCYTES UA: NEGATIVE
NITRITE UA: NEGATIVE
Protein, UA: NEGATIVE
Spec Grav, UA: 1.02
Urobilinogen, UA: 0.2
pH, UA: 6

## 2015-01-18 MED ORDER — TERCONAZOLE 0.4 % VA CREA: 1.0000 | TOPICAL_CREAM | Freq: Every day | VAGINAL | Status: DC

## 2015-01-18 NOTE — Progress Notes (Signed)
Patient ID: Gwendolyn [name], female   DOB: October 06, 1970, 44 y.o.   MRN: [id number]        Patient: Gwendolyn [name], Female    DOB: 06-19-1970, 44 y.o.   MRN: [id number] Visit Date: 01/18/2015  Today's Provider: Margarita Rana, MD   Chief Complaint  Patient presents with  . Annual Exam   Subjective:    Annual physical exam Gwendolyn [name] is a 43 y.o. female who presents today for health maintenance and complete physical. She feels well. She reports exercising 3 times a week. She reports she is sleeping poorly, sleeps 6-8 hours a night. Patient reports that she does not feel well rested.  12/07/13 CPE 12/05/12 Pap-abnormal; HPV-neg 12/31/13 Mammogram-BI-RADS 2 04/19/09 EKG  Results for orders placed or performed in visit on 01/18/15  POCT urinalysis dipstick  Result Value Ref Range   Color, UA straw    Clarity, UA neg    Glucose, UA neg    Bilirubin, UA neg    Ketones, UA neg    Spec Grav, UA 1.020    Blood, UA large    pH, UA 6.0    Protein, UA neg    Urobilinogen, UA 0.2    Nitrite, UA neg    Leukocytes, UA Negative Negative   Lab Results  Component Value Date   WBC 5.9 10/07/2014   HGB 12.8 08/08/2013   HCT 37.9 10/07/2014   PLT 260 08/08/2013   GLUCOSE 95 10/07/2014   CHOL 206* 10/07/2014   TRIG 113 10/07/2014   HDL 68 10/07/2014   LDLCALC 115* 10/07/2014   ALT 18 10/07/2014   AST 21 10/07/2014   NA 141 10/07/2014   K 4.2 10/07/2014   CL 101 10/07/2014   CREATININE 0.78 10/07/2014   BUN 9 10/07/2014   CO2 26 10/07/2014   TSH 1.580 10/07/2014   HGBA1C 5.5 10/07/2014     -----------------------------------------------------------------   Review of Systems  Constitutional: Positive for diaphoresis.  HENT: Negative.   Eyes: Negative.   Respiratory: Negative.   Cardiovascular: Negative.   Gastrointestinal: Negative.   Endocrine: Positive for heat intolerance.  Genitourinary: Negative.   Musculoskeletal: Positive for neck  pain.  Skin: Negative.   Allergic/Immunologic: Negative.   Neurological: Negative.   Hematological: Negative.   Psychiatric/Behavioral: Negative.     Social History She  reports that she quit smoking about 10 years ago. She has never used smokeless tobacco. She reports that she drinks alcohol. She reports that she does not use illicit drugs. Social History   Social History  . Marital Status: Married    Spouse Name: N/A  . Number of Children: 2  . Years of Education: N/A   Social History Main Topics  . Smoking status: Former Smoker -- 20 years    Quit date: 04/23/2004  . Smokeless tobacco: Never Used     Comment: QUIT IN 2004  . Alcohol Use: 0.0 oz/week    0 Standard drinks or equivalent per week     Comment: OCCASIONALLY  . Drug Use: No  . Sexual Activity: Not Asked   Other Topics Concern  . None   Social History Narrative    Patient Active Problem List   Diagnosis Date Noted  . Menopause 01/18/2015  . Vaginal candidiasis 11/19/2014  . Immunization, tetanus toxoid 10/06/2014  . Severe obstructive sleep apnea 10/06/2014  . External hemorrhoid 10/06/2014  . Hypercholesteremia 10/05/2014  . Vitamin D deficiency 10/05/2014  . Depressive disorder 10/05/2014  . Personal history of breast cancer  10/05/2014  . Migraine without aura with status migrainosus 10/05/2014  . 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 Insomnia 10/05/2014  . 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 Rectocele 10/05/2014  . Bipolar 2 disorder 10/05/2014  . IBS (irritable bowel syndrome) 10/05/2014  . Obstructive sleep apnea 10/05/2014  . Chronic constipation 10/05/2014  . Hypersomnia 10/05/2014  . Cellulitis 10/05/2014  . Anxiety 10/05/2014  . Fibromyalgia 10/05/2014  . Periodic limb movement disorder 10/05/2014  . Avitaminosis D 04/22/2009  . Headache, migraine 12/03/2008    Past Surgical History  Procedure Laterality Date  . Abdominal hysterectomy  04/10/2011  .  Cholecystectomy  2000  . Breast lumpectomy Right 2003  . Colectomy  10/31/2012    SECONDARY TO COLONIC INERTIA    Family History  Family Status  Relation Status Death Age  . Mother Alive   . Father Alive   . Sister Alive   . Brother Alive   . Daughter Alive   . Maternal Grandmother Deceased   . Maternal Grandfather Deceased   . Paternal Grandmother Deceased   . Paternal Grandfather Deceased   . Brother Alive   . Brother Alive   . Sister Alive   . Daughter Alive    Her family history includes Alcohol abuse in her sister; Heart disease in her maternal grandfather, maternal grandmother, paternal grandfather, and paternal grandmother; Meniere's disease in her father.    Allergies  Allergen Reactions  . Amoxicillin-Pot Clavulanate     Other reaction(s): NAUSEA (Augmentin)  . Codeine Other (See Comments)    hallucinations  . Morphine Other (See Comments)  . Penicillins     nausea, diarrhea    Previous Medications   AMPHETAMINE-DEXTROAMPHETAMINE (ADDERALL XR) 20 MG 24 HR CAPSULE    Take by mouth.    DULOXETINE (CYMBALTA) 60 MG CAPSULE    Take 60 mg by mouth daily.   GABAPENTIN (NEURONTIN) 800 MG TABLET    TAKE 1 TABLET (800 MG TOTAL) BY MOUTH NIGHTLY.   GINKGO BILOBA 40 MG TABS    Take by mouth.   HYDROCORTISONE (ANUSOL-HC) 25 MG SUPPOSITORY    INSERT 1 SUPPOSITORY RECTALLY TWICE A DAY TO 3 TIMES A DAY AS NEEDED   MELATONIN 3 MG TABS    Take 1 tablet by mouth at bedtime.   TIAGABINE (GABITRIL) 4 MG TABLET    Take by mouth.    ZOLPIDEM (AMBIEN) 10 MG TABLET    Take 10 mg by mouth at bedtime as needed for sleep.    Patient Care Team: Margarita Rana, MD as PCP - General (Family Medicine)     Objective:   Vitals: BP 100/60 mmHg  Pulse 78  Temp(Src) 98.5 F (36.9 C) (Oral)  Resp 16  Ht 5\' 6"  (1.676 m)  Wt 179 lb (81.194 kg)  BMI 28.91 kg/m2  SpO2 97%   Physical Exam  Constitutional: She is oriented to person, place, and time. She appears well-developed and  well-nourished.  HENT:  Head: Normocephalic and atraumatic.  Right Ear: Tympanic membrane, external ear and ear canal normal.  Left Ear: Tympanic membrane, external ear and ear canal normal.  Nose: Nose normal.  Mouth/Throat: Uvula is midline, oropharynx is clear and moist and mucous membranes are normal.  Eyes: Conjunctivae, EOM and lids are normal. Pupils are equal, round, and reactive to light.  Neck: Trachea normal and normal range of motion. Neck supple. Carotid bruit is not present. No thyroid mass and no thyromegaly present.  Cardiovascular: Normal rate, regular rhythm and  normal heart sounds.   Pulmonary/Chest: Effort normal and breath sounds normal.  Abdominal: Soft. Normal appearance and bowel sounds are normal. There is no hepatosplenomegaly. There is no tenderness.  Genitourinary: No breast swelling, tenderness or discharge.  Musculoskeletal: Normal range of motion.  Lymphadenopathy:    She has no cervical adenopathy.    She has no axillary adenopathy.  Neurological: She is alert and oriented to person, place, and time. She has normal strength. No cranial nerve deficit.  Skin: Skin is warm, dry and intact.  Psychiatric: She has a normal mood and affect. Her speech is normal and behavior is normal. Judgment and thought content normal. Cognition and memory are normal.     Depression Screen PHQ 2/9 Scores 01/18/2015  PHQ - 2 Score 0      Assessment & Plan:     Routine Health Maintenance and Physical Exam  Exercise Activities and Dietary recommendations Goals    . Exercise 150 minutes per week (moderate activity)       Immunization History  Administered Date(s) Administered  . Influenza,inj,Quad PF,36+ Mos 01/18/2015  . Tdap 11/02/2010    Health Maintenance  Topic Date Due  . HIV Screening  04/11/1984  . PAP SMEAR  04/11/1990  . INFLUENZA VACCINE  11/22/2014  . TETANUS/TDAP  11/01/2020      1. Annual physical exam Stable. Patient advised to continue  eating healthy and exercise daily. - POCT urinalysis dipstick  2. Need for influenza vaccination - Flu Vaccine QUAD 36+ mos IM  3. Menopause F/U pending lab report. - Follicle stimulating hormone - Luteinizing hormone  4. Hematuria Patient has a history of blood in urine, negative work up. - Urine Microscopic  5. Vaginal candidiasis - terconazole (TERAZOL 7) 0.4 % vaginal cream; Place 1 applicator vaginally at bedtime. For 14 days.  Dispense: 90 g; Refill: 3  6. Cervical cancer screening - Pap IG and HPV (high risk) DNA detection  Patient seen and examined by Dr. Jerrell Belfast, and note scribed by Philbert Riser. Dimas, CMA. I have reviewed the document for accuracy and completeness and I agree with above. Jerrell Belfast, MD   Margarita Rana, MD      --------------------------------------------------------------------

## 2015-01-19 ENCOUNTER — Telehealth: Payer: Self-pay

## 2015-01-19 LAB — URINALYSIS, MICROSCOPIC ONLY
BACTERIA UA: NONE SEEN
Casts: NONE SEEN /lpf

## 2015-01-19 LAB — LUTEINIZING HORMONE: LH: 3.1 m[IU]/mL

## 2015-01-19 LAB — FOLLICLE STIMULATING HORMONE: FSH: 2.9 m[IU]/mL

## 2015-01-19 NOTE — Telephone Encounter (Signed)
LMTCB Emily Drozdowski, CMA  

## 2015-01-19 NOTE — Telephone Encounter (Signed)
-----   Message from Margarita Rana, MD sent at 01/19/2015  8:39 AM EDT ----- Labs normal. Please notify patient. Thanks.

## 2015-01-21 LAB — PAP IG AND HPV HIGH-RISK
HPV, high-risk: NEGATIVE
PAP Smear Comment: 0

## 2015-01-21 NOTE — Telephone Encounter (Signed)
Advised pt of lab results. Pt verbally acknowledges understanding. Emily Drozdowski, CMA   

## 2015-01-24 ENCOUNTER — Telehealth: Payer: Self-pay

## 2015-01-24 NOTE — Telephone Encounter (Signed)
LMTCB 01/24/2015   Thanks,   -Mickel Baas

## 2015-01-24 NOTE — Telephone Encounter (Signed)
-----   Message from Margarita Rana, MD sent at 01/21/2015  7:42 PM EDT ----- Pap is normal. Please notify patient.  Thanks.

## 2015-01-25 NOTE — Telephone Encounter (Signed)
Tried calling; no answer.  01/25/2015  Thanks,   -Mickel Baas

## 2015-01-25 NOTE — Telephone Encounter (Signed)
Pt advised.   Thanks,   -Rendell Thivierge  

## 2015-03-18 ENCOUNTER — Encounter: Payer: Self-pay | Admitting: Family Medicine

## 2015-03-18 ENCOUNTER — Ambulatory Visit (INDEPENDENT_AMBULATORY_CARE_PROVIDER_SITE_OTHER): Payer: BC Managed Care – PPO | Admitting: Family Medicine

## 2015-03-18 ENCOUNTER — Other Ambulatory Visit: Payer: Self-pay | Admitting: Family Medicine

## 2015-03-18 VITALS — BP 102/64 | HR 80 | Temp 98.3°F | Resp 16 | Ht 66.0 in | Wt 178.0 lb

## 2015-03-18 DIAGNOSIS — Z853 Personal history of malignant neoplasm of breast: Secondary | ICD-10-CM

## 2015-03-18 DIAGNOSIS — K648 Other hemorrhoids: Secondary | ICD-10-CM

## 2015-03-18 DIAGNOSIS — N760 Acute vaginitis: Secondary | ICD-10-CM

## 2015-03-18 DIAGNOSIS — K644 Residual hemorrhoidal skin tags: Secondary | ICD-10-CM

## 2015-03-18 MED ORDER — METRONIDAZOLE 0.75 % VA GEL: 1.0000 | Freq: Two times a day (BID) | VAGINAL | Status: DC

## 2015-03-18 MED ORDER — HYDROCORTISONE ACETATE 25 MG RE SUPP: 25.0000 mg | Freq: Two times a day (BID) | RECTAL | Status: DC

## 2015-03-18 NOTE — Progress Notes (Signed)
Patient ID: Gwendolyn [name], female   DOB: 08-07-1970, 44 y.o.   MRN: [id number]       Patient: Gwendolyn [name] Female    DOB: 06/19/1970   44 y.o.   MRN: [id number] Visit Date: 03/18/2015  Today's Provider: Margarita Rana, MD   Chief Complaint  Patient presents with  . Vaginitis    X 4 days.    Subjective:    HPI Vaginitis Patient reports that she has had burning sensation and itching in her vaginal area for about 4 days. She reports that she used the Terazol cream that was prescribed on last OV, but it seemed to make symptoms worse. Patient also has burning and itching around her anus. She reports that she has internal hemorrhoids but does not know if they are starting to protrude outside of her anus. Patient is requesting suppositories to help with symptoms.      Allergies  Allergen Reactions  . Amoxicillin-Pot Clavulanate     Other reaction(s): NAUSEA (Augmentin)  . Codeine Other (See Comments)    hallucinations  . Morphine Other (See Comments)  . Penicillins     nausea, diarrhea   Previous Medications   AMPHETAMINE-DEXTROAMPHETAMINE (ADDERALL XR) 20 MG 24 HR CAPSULE    Take by mouth.    DULOXETINE (CYMBALTA) 60 MG CAPSULE    Take 60 mg by mouth daily.   GABAPENTIN (NEURONTIN) 800 MG TABLET    TAKE 1 TABLET (800 MG TOTAL) BY MOUTH NIGHTLY.   GINKGO BILOBA 40 MG TABS    Take by mouth.   HYDROCORTISONE (ANUSOL-HC) 25 MG SUPPOSITORY    INSERT 1 SUPPOSITORY RECTALLY TWICE A DAY TO 3 TIMES A DAY AS NEEDED   MELATONIN 3 MG TABS    Take 1 tablet by mouth at bedtime.   TERCONAZOLE (TERAZOL 7) 0.4 % VAGINAL CREAM    Place 1 applicator vaginally at bedtime. For 14 days.   TIAGABINE (GABITRIL) 4 MG TABLET    Take by mouth.    ZOLPIDEM (AMBIEN) 10 MG TABLET    Take 10 mg by mouth at bedtime as needed for sleep.    Review of Systems  Constitutional: Negative.   Genitourinary: Positive for vaginal discharge and vaginal pain.    Social History  Substance Use  Topics  . Smoking status: Former Smoker -- 20 years    Quit date: 04/23/2004  . Smokeless tobacco: Never Used     Comment: QUIT IN 2004  . Alcohol Use: 0.0 oz/week    0 Standard drinks or equivalent per week     Comment: OCCASIONALLY   Objective:   BP 102/64 mmHg  Pulse 80  Temp(Src) 98.3 F (36.8 C)  Resp 16  Ht 5\' 6"  (1.676 m)  Wt 178 lb (80.74 kg)  BMI 28.74 kg/m2  Physical Exam  Constitutional: She is oriented to person, place, and time. She appears well-developed and well-nourished.  Genitourinary: Vagina normal. No vaginal discharge found.  External hemorrhoid visible.    Neurological: She is alert and oriented to person, place, and time.      Assessment & Plan:     1. External hemorrhoid Will treat as noted.   - hydrocortisone (ANUSOL-HC) 25 MG suppository; Place 1 suppository (25 mg total) rectally 2 (two) times daily.  Dispense: 12 suppository; Refill: 11  2. Personal history of breast cancer Will treat for vaginitis. Concerning for atrophic vaginitis but not great candidate as did have breast cancer at very early age.    3. Vaginitis New problem.  No yeast visible today. Will treat as noted, send for wet prep, and further plan pending these results.   - metroNIDAZOLE (METROGEL VAGINAL) 0.75 % vaginal gel; Place 1 Applicatorful vaginally 2 (two) times daily.  Dispense: 70 g; Refill: 0 - WET PREP FOR [place], YEAST, CLUE     Margarita Rana, MD  [facility]

## 2015-03-23 ENCOUNTER — Telehealth: Payer: Self-pay

## 2015-03-23 ENCOUNTER — Telehealth: Payer: Self-pay | Admitting: Family Medicine

## 2015-03-23 LAB — WET PREP FOR TRICH, YEAST, CLUE
CLUE CELL EXAM: POSITIVE — AB
TRICHOMONAS EXAM: NEGATIVE
YEAST EXAM: NEGATIVE

## 2015-03-23 NOTE — Telephone Encounter (Signed)
-----   Message from Margarita Rana, MD sent at 03/23/2015 11:59 AM EST ----- Positive for vaginitis. Should be improved with medication she is on. Thanks.

## 2015-03-23 NOTE — Telephone Encounter (Signed)
Pt called and would like results from her wet prep. Pt stated that she would like the results left on the cell phone above and is also on DPR. Thanks TNP

## 2015-03-23 NOTE — Telephone Encounter (Signed)
Called [facility] and spoke with Crystal and she will be faxing over results.

## 2015-03-23 NOTE — Telephone Encounter (Signed)
LMTCB 03/23/2015  Thanks,   -Alwin Lanigan  

## 2015-03-23 NOTE — Telephone Encounter (Signed)
Pt advised as directed below.   Thanks,   -Florie Carico  

## 2015-06-08 ENCOUNTER — Ambulatory Visit: Payer: BC Managed Care – PPO | Admitting: Family Medicine

## 2015-06-13 ENCOUNTER — Ambulatory Visit (INDEPENDENT_AMBULATORY_CARE_PROVIDER_SITE_OTHER): Payer: BC Managed Care – PPO | Admitting: Family Medicine

## 2015-06-13 ENCOUNTER — Encounter: Payer: Self-pay | Admitting: Family Medicine

## 2015-06-13 VITALS — BP 102/64 | HR 72 | Temp 98.7°F | Resp 16 | Wt 178.0 lb

## 2015-06-13 DIAGNOSIS — N951 Menopausal and female climacteric states: Secondary | ICD-10-CM | POA: Diagnosis not present

## 2015-06-13 DIAGNOSIS — N760 Acute vaginitis: Secondary | ICD-10-CM

## 2015-06-13 DIAGNOSIS — Z87898 Personal history of other specified conditions: Secondary | ICD-10-CM | POA: Diagnosis not present

## 2015-06-13 DIAGNOSIS — B3731 Acute candidiasis of vulva and vagina: Secondary | ICD-10-CM

## 2015-06-13 DIAGNOSIS — M797 Fibromyalgia: Secondary | ICD-10-CM | POA: Diagnosis not present

## 2015-06-13 DIAGNOSIS — B373 Candidiasis of vulva and vagina: Secondary | ICD-10-CM | POA: Diagnosis not present

## 2015-06-13 MED ORDER — SCOPOLAMINE 1 MG/3DAYS TD PT72: 1.0000 | MEDICATED_PATCH | TRANSDERMAL | Status: DC

## 2015-06-13 MED ORDER — GABAPENTIN 800 MG PO TABS: ORAL_TABLET | ORAL | Status: DC

## 2015-06-13 NOTE — Progress Notes (Signed)
Patient ID: Gwendolyn [name], female   DOB: 1971/03/01, 45 y.o.   MRN: [id number]        Patient: Gwendolyn [name] Female    DOB: [birth year]   45 y.o.   MRN: [id number] Visit Date: 06/13/2015  Today's Provider: Margarita Rana, MD   Chief Complaint  Patient presents with  . Vaginitis   Subjective:    HPI   Pt reports having on going trouble with yeast infections.  She has used Terazol 7 and Metrogel with some relief, but she says the infection comes right back.  She has had a recent flare for the last three weeks.  Right now she is using OTC creams she says it only helps slightly.  She would like to discuss trying an estrogen cream. Has has breast cancer.  Is having hot flashes. Does have her ovaries.  Very frustrated with recurrent symptoms. Husband does get yeast infections also. Has had vaginal bleeding.         Allergies  Allergen Reactions  . Amoxicillin-Pot Clavulanate     Other reaction(s): NAUSEA (Augmentin)  . Codeine Other (See Comments)    hallucinations  . Morphine Other (See Comments)  . Penicillins     nausea, diarrhea   Previous Medications   AMPHETAMINE-DEXTROAMPHETAMINE (ADDERALL XR) 20 MG 24 HR CAPSULE    Take by mouth.    DULOXETINE (CYMBALTA) 60 MG CAPSULE    Take 60 mg by mouth daily.   GINKGO BILOBA 40 MG TABS    Take by mouth.   HYDROCORTISONE (ANUSOL-HC) 25 MG SUPPOSITORY    Place 1 suppository (25 mg total) rectally 2 (two) times daily.   MELATONIN 3 MG TABS    Take 1 tablet by mouth at bedtime.   METRONIDAZOLE (METROGEL VAGINAL) 0.75 % VAGINAL GEL    Place 1 Applicatorful vaginally 2 (two) times daily.   TERCONAZOLE (TERAZOL 7) 0.4 % VAGINAL CREAM    Place 1 applicator vaginally at bedtime. For 14 days.   TIAGABINE (GABITRIL) 4 MG TABLET    Take by mouth.    ZOLPIDEM (AMBIEN) 10 MG TABLET    Take 10 mg by mouth at bedtime as needed for sleep.    Review of Systems  Constitutional: Negative for fever, chills, diaphoresis, activity  change, appetite change, fatigue and unexpected weight change.  Genitourinary: Positive for vaginal bleeding (Spotted about a week ago.  (has had a hysterectomy)) and vaginal discharge. Negative for dysuria, urgency, frequency, hematuria, flank pain, decreased urine volume, enuresis, difficulty urinating, vaginal pain, pelvic pain and dyspareunia.  Neurological: Negative for dizziness, light-headedness and headaches.    Social History  Substance Use Topics  . Smoking status: Former Smoker -- 20 years    Quit date: 04/23/2004  . Smokeless tobacco: Never Used     Comment: QUIT IN 2004  . Alcohol Use: 0.0 oz/week    0 Standard drinks or equivalent per week     Comment: OCCASIONALLY   Objective:   BP 102/64 mmHg  Pulse 72  Temp(Src) 98.7 F (37.1 C) (Oral)  Resp 16  Wt 178 lb (80.74 kg)  Physical Exam  Constitutional: She is oriented to person, place, and time. She appears well-developed and well-nourished.  Cardiovascular: Normal rate and regular rhythm.   Pulmonary/Chest: Effort normal and breath sounds normal.  Genitourinary: Vagina normal.  Does have cervix. Tissue looks healthy.  Does have cervix.    Neurological: She is alert and oriented to person, place, and time.      Assessment &  Plan:     1. Vaginal candidiasis Continue current medication for now. Will send test.   2. Fibromyalgia Refill medication.   - gabapentin (NEURONTIN) 800 MG tablet; TAKE 1 TABLET (800 MG TOTAL) BY MOUTH NIGHTLY.  Dispense: 90 tablet; Refill: 1  3. Hx of motion sickness Will send rx for trip.  - scopolamine (TRANSDERM-SCOP) 1 MG/3DAYS; Place 1 patch (1.5 mg total) onto the skin every 3 (three) days.  Dispense: 10 patch; Refill: 1  4. Menopausal symptoms Patient concerned vaginal issues are related to low estrogen. Will check hormone levels.   - FSH/LH - Estradiol  5. Vaginitis Will send swab.   - NuSwab Vaginitis Plus (VG+)     Patient was seen and examined by Jerrell Belfast, MD,  and note scribed by Ashley Royalty, CMA.  I have reviewed the document for accuracy and completeness and I agree with above. - Jerrell Belfast, MD   Margarita Rana, MD  [facility]

## 2015-06-16 LAB — NUSWAB VAGINITIS PLUS (VG+)
CANDIDA ALBICANS, NAA: NEGATIVE
CHLAMYDIA TRACHOMATIS, NAA: NEGATIVE
Candida glabrata, NAA: NEGATIVE
Neisseria gonorrhoeae, NAA: NEGATIVE
TRICH VAG BY NAA: NEGATIVE

## 2015-06-17 ENCOUNTER — Telehealth: Payer: Self-pay

## 2015-06-17 NOTE — Telephone Encounter (Signed)
-----   Message from Margarita Rana, MD sent at 06/16/2015  4:43 PM EST ----- No BV or yeast noted on swab.  Labs do not indicate post-menopausal. Awaiting one more lab to check estrogen level. Please notify patient of current findings.  Thanks.

## 2015-06-17 NOTE — Telephone Encounter (Signed)
Tried calling pt, no answer. Will try again later. Renaldo Fiddler, CMA

## 2015-06-21 ENCOUNTER — Telehealth: Payer: Self-pay

## 2015-06-21 DIAGNOSIS — B3731 Acute candidiasis of vulva and vagina: Secondary | ICD-10-CM

## 2015-06-21 DIAGNOSIS — B373 Candidiasis of vulva and vagina: Secondary | ICD-10-CM

## 2015-06-21 LAB — ESTRADIOL, FREE
Estradiol, Serum, MS: 52 pg/mL
Free Estradiol, Percent: 2.4 %
Free Estradiol, Serum: 1.2 pg/mL

## 2015-06-21 LAB — FSH/LH
FSH: 4.8 m[IU]/mL
LH: 8 m[IU]/mL

## 2015-06-21 NOTE — Telephone Encounter (Signed)
LMTCB Emily Drozdowski, CMA  

## 2015-06-21 NOTE — Telephone Encounter (Signed)
LMTCB Cyril Woodmansee Drozdowski, CMA  

## 2015-06-21 NOTE — Telephone Encounter (Signed)
-----   Message from Margarita Rana, MD sent at 06/21/2015  7:06 AM EST ----- Estrogen low normal. Not really sure what else to do for vaginal issues. Recommend gyn referral to evaluate and treat.  Thanks.

## 2015-06-23 NOTE — Telephone Encounter (Signed)
LMTCB ED 

## 2015-06-23 NOTE — Telephone Encounter (Signed)
Pt is returning call.  CB#[phone]/MW

## 2015-06-24 NOTE — Telephone Encounter (Signed)
Pt advised, she wants to proceed [facility] [place] [facility] she can do 4 or 4:30 appointment.-aa

## 2015-06-30 ENCOUNTER — Encounter: Payer: Self-pay | Admitting: Family Medicine

## 2015-07-22 ENCOUNTER — Ambulatory Visit (INDEPENDENT_AMBULATORY_CARE_PROVIDER_SITE_OTHER): Payer: BC Managed Care – PPO | Admitting: Internal Medicine

## 2015-07-22 ENCOUNTER — Encounter: Payer: Self-pay | Admitting: Internal Medicine

## 2015-07-22 VITALS — BP 120/70 | HR 70 | Temp 97.9°F | Resp 12 | Ht 66.0 in | Wt 175.5 lb

## 2015-07-22 DIAGNOSIS — M797 Fibromyalgia: Secondary | ICD-10-CM

## 2015-07-22 DIAGNOSIS — J208 Acute bronchitis due to other specified organisms: Secondary | ICD-10-CM | POA: Diagnosis not present

## 2015-07-22 DIAGNOSIS — R5383 Other fatigue: Secondary | ICD-10-CM

## 2015-07-22 DIAGNOSIS — E663 Overweight: Secondary | ICD-10-CM | POA: Diagnosis not present

## 2015-07-22 LAB — HEMOGLOBIN A1C: HEMOGLOBIN A1C: 5.7 % (ref 4.6–6.5)

## 2015-07-22 LAB — CORTISOL: CORTISOL PLASMA: 7.7 ug/dL

## 2015-07-22 MED ORDER — LEVOFLOXACIN 500 MG PO TABS: 500.0000 mg | ORAL_TABLET | Freq: Every day | ORAL | Status: DC

## 2015-07-22 MED ORDER — PREDNISONE 10 MG PO TABS: ORAL_TABLET | ORAL | Status: DC

## 2015-07-22 NOTE — Patient Instructions (Addendum)
Checking thyroid and cortisol levels   You have a viral syndrome which is causing bronchitis.    The post nasal drip may also be contributing to  your  Sore throat .  Continue generic OTC benadryl 25 mg  At bedtime for the drainage, Delsym for daytime cough.You can use sudafed PE  Up to 30 mg every 6 hours if needed for the congestion in your ears, and substitute Afrin nasal spray for the evening dose to avoid insomnia. I'd be happy to call in a cough suppressant if OTC Delsym does not help suppress the cough.   flush your sinuses twice daily with Milta Deiters Meds sinus sinse   but if you develop Green nasal discharge,  Ear  Or facial pain, start the antibiotic I have given you a prescription for (Leaquin plus prednisone)  Please take a probiotic ( Align, Flora[place] or Culturelle) while you are on the antibiotic to prevent  a vaginal yeast infection    This is my  example of a  "Low GI"  Diet:  It will allow you to lose 4 to 8  lbs  per month if you follow it carefully.  Your goal with exercise is a minimum of 30 minutes of aerobic exercise 5 days per week (Walking does not count once it becomes easy!)    All of the foods can be found at grocery stores and in bulk at [employer].  The Atkins protein bars and shakes are available in more varieties at Target, [employer] and [employer].     7 AM Breakfast:  Choose from the following:  Low carbohydrate Protein  Shakes (I recommend the  Premier Protein chocolate shake, s EAS AdvantEdge "Carb Control" shakes  Or the low carb shakes by Atkins.    2.5 carbs)   Arnold's "Sandwhich Thin"toasted  w/ peanut butter (no jelly: about 20 net carbs  "Bagel Thin" with cream cheese and salmon: about 20 carbs   a scrambled egg/bacon/cheese burrito made with Mission's "carb balance" whole wheat tortilla  (about 10 net carbs )  Regulatory affairs officer (basically a quiche without the pastry crust) that is eaten cold and very convenient way to get your  eggs  If you make your own shakes, avoid bananas and pineapple,  And use low carb greek yogurt or almond milk    Avoid cereal and bananas, oatmeal and cream of wheat and grits. They are loaded with carbohydrates!   10 AM: high protein snack:  Protein bar by Atkins (the snack size, under 200 cal, usually < 6 net carbs).    A stick of cheese:  Around 1 carb,  100 cal     Dannon Light n Fit [place] Yogurt  (80 cal, 8 carbs)  Other so called "protein bars" and Greek yogurts tend to be loaded with carbohydrates.  Remember, in food advertising, the word "energy" is synonymous for " carbohydrate."  Lunch:   A Sandwich using the bread choices listed, Can use any  Eggs,  lunchmeat, grilled meat or canned tuna), avocado, regular mayo/mustard  and cheese.  A Salad using blue cheese, ranch,  Goddess or vinagrette,  Avoid taco shells, croutons or "confetti" and no "candied nuts" but regular nuts OK.   No pretzels, nabs  or chips.  Pickles and miniature sweet peppers are a good low carb alternative that provide a "crunch"  The bread is the only source of carbohydrate in a sandwich and  can be decreased by trying some of  these alternatives to traditional loaf bread  Joseph's makes a pita bread and a flat bread that are 50 cal and 4 net carbs available at [place] and [place].  This can be toasted to use with hummous as well  Toufayan makes a low carb flatbread that's 100 cal and 9 net carbs available at [employer] and [employer]J's makes 2 sizes of  Low carb whole wheat tortilla  (The large one is 210 cal and 6 net carbs)  Ezekiel bread is a loaf bread sold in the frozen section of higher end grocery chains,  Very low carb  Avoid "Low fat dressings, as well as Barry Brunner and [place] dressings They are loaded with sugar!   3 PM/ Mid day  Snack:  Consider  1 ounce of  almonds, walnuts, pistachios, pecans, peanuts,  Macadamia nuts or a nut medley.  Avoid "granola"; the dried cranberries and raisins are loaded  with carbohydrates. Mixed nuts as long as there are no raisins,  cranberries or dried fruit.    Try the prosciutto/mozzarella cheese sticks by Fiorruci  In deli /backery section   High protein      6 PM  Dinner:     Meat/fowl/fish with a green salad, and either broccoli, cauliflower, green beans, spinach, brussel sprouts or  Lima beans. DO NOT BREAD THE PROTEIN!!      There is a low carb pasta by Dreamfield's that is acceptable and tastes great: only 5 digestible carbs/serving.( All grocery stores but BJs carry it )  Try Hurley Cisco Angelo's chicken piccata or chicken or eggplant parm over low carb pasta.(Lowes and BJs)   Marjory Lies Sanchez's "Carnitas" (pulled pork, no sauce,  0 carbs) or his beef pot roast to make a dinner burrito (at BJ's)  Pesto over low carb pasta (bj's sells a good quality pesto in the center refrigerated section of the deli   Try satueeing  Cheral Marker with mushroooms  Whole wheat pasta is still full of digestible carbs and  Not as low in glycemic index as Dreamfield's.   Brown rice is still rice,  So skip the rice and noodles if you eat [place] or [place] (or at least limit to 1/2 cup)  9 PM snack :   Breyer's "low carb" fudgsicle or  ice cream bar (Carb Smart line), or  Weight Watcher's ice cream bar , or another "no sugar added" ice cream;  a serving of fresh berries/cherries with whipped cream   Cheese or DANNON'S LlGHT N FIT GREEK YOGURT  8 ounces of [place] unsweetened almond/cococunut milk    Treat yourself to a parfait made with whipped cream blueberiies, walnuts and vanilla greek yogurt  Avoid bananas, pineapple, grapes  and watermelon on a regular basis because they are high in sugar.  THINK OF THEM AS DESSERT  Remember that snack Substitutions should be less than 10 NET carbs per serving and meals < 20 carbs. Remember to subtract fiber grams to get the "net carbs."

## 2015-07-22 NOTE — Progress Notes (Signed)
Pre-visit discussion using our clinic review tool. No additional management support is needed unless otherwise documented below in the visit note.  

## 2015-07-22 NOTE — Progress Notes (Signed)
Subjective:  Patient ID: Gwendolyn [name], female    DOB: [birth year]  Age: 45 y.o. MRN: [id number]  CC: The primary encounter diagnosis was Other fatigue. Diagnoses of Viral bronchitis, Overweight, and Fibromyalgia were also pertinent to this visit.  HPI Gwendolyn [name] presents for establishment of care  History of adynamic colon s/p colectomy and reanastomosis in 2014 at [facility] 7 stools day   Wants me to manage gabapentin for FM at night..  Dr Nicolasa Ducking managed the rest.   Bipolar do diagnosed at age 32, adderall.  Abilify resumed a month ago nut makes her feel a little manic. Did not tolerate lamictal bc it made her very aggressive and hostile.  gabitril was discontinued due to physician preference (old drug) .    Inability to lose weight.  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  Healthy diet.  But not restricting fat or carbs. Weight has not decreased and waist has not increased.    [name] has a past medical history of Depression.   She has past surgical history that includes Cholecystectomy (2000); Breast lumpectomy (Right, 2003); Colectomy (10/31/2012); and Abdominal hysterectomy (04/10/2011).   Her family history includes Alcohol abuse in her sister; Heart disease in her maternal grandfather, maternal grandmother, paternal grandfather, and paternal grandmother; Meniere's disease in her father.She reports that she quit smoking about 11 years ago. She has never used smokeless tobacco. She reports that she drinks alcohol. She reports that she does not use illicit drugs.  Outpatient Prescriptions Prior to Visit  Medication Sig Dispense Refill  . amphetamine-dextroamphetamine (ADDERALL XR) 20 MG 24 hr capsule Take by mouth.     . DULoxetine (CYMBALTA) 60 MG capsule Take 60 mg by mouth daily.    Marland Kitchen gabapentin (NEURONTIN) 800 MG tablet TAKE 1 TABLET (800 MG TOTAL) BY MOUTH NIGHTLY. 90 tablet 1  . Ginkgo Biloba 40 MG TABS Take by mouth.    .  hydrocortisone (ANUSOL-HC) 25 MG suppository Place 1 suppository (25 mg total) rectally 2 (two) times daily. 12 suppository 11  . Melatonin 3 MG TABS Take 1 tablet by mouth at bedtime.    Marland Kitchen zolpidem (AMBIEN) 10 MG tablet Take 10 mg by mouth at bedtime as needed for sleep.    . metroNIDAZOLE (METROGEL VAGINAL) 0.75 % vaginal gel Place 1 Applicatorful vaginally 2 (two) times daily. (Patient not taking: Reported on 07/22/2015) 70 g 0  . scopolamine (TRANSDERM-SCOP) 1 MG/3DAYS Place 1 patch (1.5 mg total) onto the skin every 3 (three) days. (Patient not taking: Reported on 07/22/2015) 10 patch 1  . terconazole (TERAZOL 7) 0.4 % vaginal cream Place 1 applicator vaginally at bedtime. For 14 days. (Patient not taking: Reported on 07/22/2015) 90 g 3  . tiaGABine (GABITRIL) 4 MG tablet Take by mouth. Reported on 07/22/2015     No facility-administered medications prior to visit.    Review of Systems:  Patient denies headache, fevers, malaise, unintentional weight loss, skin rash, eye pain, sinus congestion and sinus pain, sore throat, dysphagia,  hemoptysis , cough, dyspnea, wheezing, chest pain, palpitations, orthopnea, edema, abdominal pain, nausea, melena, diarrhea, constipation, flank pain, dysuria, hematuria, urinary  Frequency, nocturia, numbness, tingling, seizures,  Focal weakness, Loss of consciousness,  Tremor, insomnia, depression, anxiety, and suicidal ideation.     Objective:  BP 120/70 mmHg  Pulse 70  Temp(Src) 97.9 F (36.6 C) (Oral)  Resp 12  Ht 5\' 6"  (1.676 m)  Wt 175 lb 8 oz (79.606 kg)  BMI 28.34 kg/m2  SpO2 98%  Physical Exam:  General appearance: alert, cooperative and appears stated age Ears: normal TM's and external ear canals both ears Throat: lips, mucosa, and tongue normal; teeth and gums normal Neck: no adenopathy, no carotid bruit, supple, symmetrical, trachea midline and thyroid not enlarged, symmetric, no tenderness/mass/nodules Back: symmetric, no curvature. ROM  normal. No CVA tenderness. Lungs: clear to auscultation bilaterally Heart: regular rate and rhythm, S1, S2 normal, no murmur, click, rub or gallop Abdomen: soft, non-tender; bowel sounds normal; no masses,  no organomegaly Pulses: 2+ and symmetric Skin: Skin color, texture, turgor normal. No rashes or lesions Lymph nodes: Cervical, supraclavicular, and axillary nodes normal.   Assessment & Plan:   Problem List Items Addressed This Visit    Fibromyalgia    Managed with gabapentin and regular exercise.       Viral bronchitis    Lung exam is normal,  No signs of sinusitis or otitis either.  Symptoms appear to be secondary to viral infection.  Prednisone taper,   benzonotate for cough control.        Overweight    I have addressed her inability to lose weight and recommended wt loss of 10% of body weigh over the next 6 months using a low glycemic index diet and regular exercise a minimum of 5 days per week. TSH, A1c and cortisol are normal  Lab Results  Component Value Date   TSH 1.980 07/22/2015           Other Visit Diagnoses    Other fatigue    -  Primary    Relevant Orders    Cortisol (Completed)    T4 AND TSH (Completed)    Hemoglobin A1c (Completed)       I am having Ms. Saiz start on levofloxacin and predniSONE. I am also having her maintain her amphetamine-dextroamphetamine, zolpidem, tiaGABine, Melatonin, DULoxetine, Ginkgo Biloba, terconazole, metroNIDAZOLE, hydrocortisone, gabapentin, scopolamine, and ARIPiprazole.  Meds ordered this encounter  Medications  . ARIPiprazole (ABILIFY) 5 MG tablet    Sig: Take 5 mg by mouth daily.    Refill:  1  . levofloxacin (LEVAQUIN) 500 MG tablet    Sig: Take 1 tablet (500 mg total) by mouth daily.    Dispense:  7 tablet    Refill:  0  . predniSONE (DELTASONE) 10 MG tablet    Sig: 6 tablets on Day 1 , then reduce by 1 tablet daily until gone    Dispense:  21 tablet    Refill:  0    There are no discontinued  medications.  Follow-up: No Follow-up on file.   [name], MD

## 2015-07-23 DIAGNOSIS — E669 Obesity, unspecified: Secondary | ICD-10-CM | POA: Insufficient documentation

## 2015-07-23 DIAGNOSIS — J208 Acute bronchitis due to other specified organisms: Secondary | ICD-10-CM | POA: Insufficient documentation

## 2015-07-23 LAB — T4 AND TSH
T4 TOTAL: 8.4 ug/dL (ref 4.5–12.0)
TSH: 1.98 u[IU]/mL (ref 0.450–4.500)

## 2015-07-23 NOTE — Assessment & Plan Note (Signed)
Managed with gabapentin and regular exercise.

## 2015-07-23 NOTE — Assessment & Plan Note (Addendum)
I have addressed her inability to lose weight and recommended wt loss of 10% of body weigh over the next 6 months using a low glycemic index diet and regular exercise a minimum of 5 days per week. TSH, A1c and cortisol are normal  Lab Results  Component Value Date   TSH 1.980 07/22/2015

## 2015-07-23 NOTE — Assessment & Plan Note (Signed)
Lung exam is normal,  No signs of sinusitis or otitis either.  Symptoms appear to be secondary to viral infection.  Prednisone taper,   benzonotate for cough control.

## 2015-07-25 ENCOUNTER — Encounter: Payer: Self-pay | Admitting: Internal Medicine

## 2015-11-10 ENCOUNTER — Ambulatory Visit (INDEPENDENT_AMBULATORY_CARE_PROVIDER_SITE_OTHER): Payer: BC Managed Care – PPO

## 2015-11-10 ENCOUNTER — Ambulatory Visit (INDEPENDENT_AMBULATORY_CARE_PROVIDER_SITE_OTHER): Payer: BC Managed Care – PPO | Admitting: Podiatry

## 2015-11-10 ENCOUNTER — Encounter: Payer: Self-pay | Admitting: Podiatry

## 2015-11-10 DIAGNOSIS — R52 Pain, unspecified: Secondary | ICD-10-CM

## 2015-11-10 DIAGNOSIS — M7662 Achilles tendinitis, left leg: Secondary | ICD-10-CM | POA: Diagnosis not present

## 2015-11-10 DIAGNOSIS — M7661 Achilles tendinitis, right leg: Secondary | ICD-10-CM | POA: Insufficient documentation

## 2015-11-10 DIAGNOSIS — M722 Plantar fascial fibromatosis: Secondary | ICD-10-CM

## 2015-11-10 MED ORDER — MELOXICAM 15 MG PO TABS: 15.0000 mg | ORAL_TABLET | Freq: Every day | ORAL | Status: DC

## 2015-11-10 MED ORDER — METHYLPREDNISOLONE 4 MG PO TBPK: ORAL_TABLET | ORAL | Status: DC

## 2015-11-10 NOTE — Progress Notes (Signed)
Patient ID: Gwendolyn [name], female   DOB: 14-Jan-1971, 45 y.o.   MRN: [id number]   Subjective: 45 year old female presents the office today for concerns of bilateral heel pain which is been ongoing or several weeks. She states that the end of June she was a [place] doing a lot of walking and then went to [employer]. She states that she had zumba class last weekend. Since all his activity she started developing pain on the bottom of her heels also back of the heel. She denies any swelling or redness of the foot. No numbness or tingling. Said no recent treatment other than nights, she had on last night that she purchased online. Dispensed night splint did help. Denies any systemic complaints such as fevers, chills, nausea, vomiting. No acute changes since last appointment, and no other complaints at this time.   She is scheduled to go to [place] tomorrow for 2 weeks.   Objective: AAO x3, NAD DP/PT pulses palpable bilaterally, CRT less than 3 seconds Tenderness to palpation along the plantar medial tubercle of the calcaneus at the insertion of plantar fascia on the left and right foot. There is no pain along the course of the plantar fascia within the arch of the foot. Plantar fascia appears to be intact. There is no pain with lateral compression of the calcaneus or pain with vibratory sensation. There is pain along the insertion of the achilles tendon. No other areas of tenderness to bilateral lower extremities. No areas of pinpoint bony tenderness or pain with vibratory sensation. MMT 5/5, ROM WNL. No edema, erythema, increase in warmth to bilateral lower extremities.  No open lesions or pre-ulcerative lesions.  No pain with calf compression, swelling, warmth, erythema  Assessment: Bilateral heel pain likely plantar fasciitis, Achilles tendinitis  Plan: -All treatment options discussed with the patient including all alternatives, risks, complications.  -Etiology of symptoms were  discussed X-rays obtained and reviewed with the patient. No evidence of acute fracture identified. -Patient elects to proceed with steroid injection into the left and right plantar heel. Under sterile skin preparation, a total of 2.5cc of kenalog 10, 0.5% Marcaine plain, and 2% lidocaine plain were infiltrated into the symptomatic area without complication. A band-aid was applied. Patient tolerated the injection well without complication. Post-injection care with discussed with the patient. Discussed with the patient to ice the area over the next couple of days to help prevent a steroid flare.  -Prescribed Medrol Dosepak. Once this is complete she can start meloxicam but did not take together. Discussed side effects the medicine. -Stretching exercises -Ice -Dispensed heel lift -Plantar fascial brace bilaterally -She requests an additional night splint which was dispensed -Discussed supportive shoes -Follow up in 3 weeks or sooner if any issues are to arise.  Celesta Gentile, DPM

## 2015-11-10 NOTE — Patient Instructions (Signed)
Start taking the steroid (medrol dose pack) once this is finished can start meloxicam (mobic)   Achilles Tendinitis With Rehab Achilles tendinitis is a disorder of the Achilles tendon. The Achilles tendon connects the large calf muscles (Gastrocnemius and Soleus) to the heel bone (calcaneus). This tendon is sometimes called the heel cord. It is important for pushing-off and standing on your toes and is important for walking, running, or jumping. Tendinitis is often caused by overuse and repetitive microtrauma. SYMPTOMS  Pain, tenderness, swelling, warmth, and redness may occur over the Achilles tendon even at rest.  Pain with pushing off, or flexing or extending the ankle.  Pain that is worsened after or during activity. CAUSES   Overuse sometimes seen with rapid increase in exercise programs or in sports requiring running and jumping.  Poor physical conditioning (strength and flexibility or endurance).  Running sports, especially training running down hills.  Inadequate warm-up before practice or play or failure to stretch before participation.  Injury to the tendon. PREVENTION   Warm up and stretch before practice or competition.  Allow time for adequate rest and recovery between practices and competition.  Keep up conditioning.  Keep up ankle and leg flexibility.  Improve or keep muscle strength and endurance.  Improve cardiovascular fitness.  Use proper technique.  Use proper equipment (shoes, skates).  To help prevent recurrence, taping, protective strapping, or an adhesive bandage may be recommended for several weeks after healing is complete. PROGNOSIS   Recovery may take weeks to several months to heal.  Longer recovery is expected if symptoms have been prolonged.  Recovery is usually quicker if the inflammation is due to a direct blow as compared with overuse or sudden strain. RELATED COMPLICATIONS   Healing time will be prolonged if the condition is not  correctly treated. The injury must be given plenty of time to heal.  Symptoms can reoccur if activity is resumed too soon.  Untreated, tendinitis may increase the risk of tendon rupture requiring additional time for recovery and possibly surgery. TREATMENT   The first treatment consists of rest anti-inflammatory medication, and ice to relieve the pain.  Stretching and strengthening exercises after resolution of pain will likely help reduce the risk of recurrence. Referral to a physical therapist or athletic trainer for further evaluation and treatment may be helpful.  A walking boot or cast may be recommended to rest the Achilles tendon. This can help break the cycle of inflammation and microtrauma.  Arch supports (orthotics) may be prescribed or recommended by your caregiver as an adjunct to therapy and rest.  Surgery to remove the inflamed tendon lining or degenerated tendon tissue is rarely necessary and has shown less than predictable results. MEDICATION   Nonsteroidal anti-inflammatory medications, such as aspirin and ibuprofen, may be used for pain and inflammation relief. Do not take within 7 days before surgery. Take these as directed by your caregiver. Contact your caregiver immediately if any bleeding, stomach upset, or signs of allergic reaction occur. Other minor pain relievers, such as acetaminophen, may also be used.  Pain relievers may be prescribed as necessary by your caregiver. Do not take prescription pain medication for longer than 4 to 7 days. Use only as directed and only as much as you need.  Cortisone injections are rarely indicated. Cortisone injections may weaken tendons and predispose to rupture. It is better to give the condition more time to heal than to use them. HEAT AND COLD  Cold is used to relieve pain and reduce  inflammation for acute and chronic Achilles tendinitis. Cold should be applied for 10 to 15 minutes every 2 to 3 hours for inflammation and pain  and immediately after any activity that aggravates your symptoms. Use ice packs or an ice massage.  Heat may be used before performing stretching and strengthening activities prescribed by your caregiver. Use a heat pack or a warm soak. SEEK MEDICAL CARE IF:  Symptoms get worse or do not improve in 2 weeks despite treatment.  New, unexplained symptoms develop. Drugs used in treatment may produce side effects. EXERCISES RANGE OF MOTION (ROM) AND STRETCHING EXERCISES - Achilles Tendinitis  These exercises may help you when beginning to rehabilitate your injury. Your symptoms may resolve with or without further involvement from your physician, physical therapist or athletic trainer. While completing these exercises, remember:   Restoring tissue flexibility helps normal motion to return to the joints. This allows healthier, less painful movement and activity.  An effective stretch should be held for at least 30 seconds.  A stretch should never be painful. You should only feel a gentle lengthening or release in the stretched tissue. STRETCH - Gastroc, Standing   Place hands on wall.  Extend right / left leg, keeping the front knee somewhat bent.  Slightly point your toes inward on your back foot.  Keeping your right / left heel on the floor and your knee straight, shift your weight toward the wall, not allowing your back to arch.  You should feel a gentle stretch in the right / left calf. Hold this position for __________ seconds. Repeat __________ times. Complete this stretch __________ times per day. STRETCH - Soleus, Standing   Place hands on wall.  Extend right / left leg, keeping the other knee somewhat bent.  Slightly point your toes inward on your back foot.  Keep your right / left heel on the floor, bend your back knee, and slightly shift your weight over the back leg so that you feel a gentle stretch deep in your back calf.  Hold this position for __________  seconds. Repeat __________ times. Complete this stretch __________ times per day. STRETCH - Gastrocsoleus, Standing  Note: This exercise can place a lot of stress on your foot and ankle. Please complete this exercise only if specifically instructed by your caregiver.   Place the ball of your right / left foot on a step, keeping your other foot firmly on the same step.  Hold on to the wall or a rail for balance.  Slowly lift your other foot, allowing your body weight to press your heel down over the edge of the step.  You should feel a stretch in your right / left calf.  Hold this position for __________ seconds.  Repeat this exercise with a slight bend in your knee. Repeat __________ times. Complete this stretch __________ times per day.  STRENGTHENING EXERCISES - Achilles Tendinitis These exercises may help you when beginning to rehabilitate your injury. They may resolve your symptoms with or without further involvement from your physician, physical therapist or athletic trainer. While completing these exercises, remember:   Muscles can gain both the endurance and the strength needed for everyday activities through controlled exercises.  Complete these exercises as instructed by your physician, physical therapist or athletic trainer. Progress the resistance and repetitions only as guided.  You may experience muscle soreness or fatigue, but the pain or discomfort you are trying to eliminate should never worsen during these exercises. If this pain does worsen, stop  and make certain you are following the directions exactly. If the pain is still present after adjustments, discontinue the exercise until you can discuss the trouble with your clinician. STRENGTH - Plantar-flexors   Sit with your right / left leg extended. Holding onto both ends of a rubber exercise band/tubing, loop it around the ball of your foot. Keep a slight tension in the band.  Slowly push your toes away from you,  pointing them downward.  Hold this position for __________ seconds. Return slowly, controlling the tension in the band/tubing. Repeat __________ times. Complete this exercise __________ times per day.  STRENGTH - Plantar-flexors   Stand with your feet shoulder width apart. Steady yourself with a wall or table using as little support as needed.  Keeping your weight evenly spread over the width of your feet, rise up on your toes.*  Hold this position for __________ seconds. Repeat __________ times. Complete this exercise __________ times per day.  *If this is too easy, shift your weight toward your right / left leg until you feel challenged. Ultimately, you may be asked to do this exercise with your right / left foot only. STRENGTH - Plantar-flexors, Eccentric  Note: This exercise can place a lot of stress on your foot and ankle. Please complete this exercise only if specifically instructed by your caregiver.   Place the balls of your feet on a step. With your hands, use only enough support from a wall or rail to keep your balance.  Keep your knees straight and rise up on your toes.  Slowly shift your weight entirely to your right / left toes and pick up your opposite foot. Gently and with controlled movement, lower your weight through your right / left foot so that your heel drops below the level of the step. You will feel a slight stretch in the back of your calf at the end position.  Use the healthy leg to help rise up onto the balls of both feet, then lower weight only on the right / left leg again. Build up to 15 repetitions. Then progress to 3 consecutive sets of 15 repetitions.*  After completing the above exercise, complete the same exercise with a slight knee bend (about 30 degrees). Again, build up to 15 repetitions. Then progress to 3 consecutive sets of 15 repetitions.* Perform this exercise __________ times per day.  *When you easily complete 3 sets of 15, your physician,  physical therapist or athletic trainer may advise you to add resistance by wearing a backpack filled with additional weight. STRENGTH - Plantar Flexors, Seated   Sit on a chair that allows your feet to rest flat on the ground. If necessary, sit at the edge of the chair.  Keeping your toes firmly on the ground, lift your right / left heel as far as you can without increasing any discomfort in your ankle. Repeat __________ times. Complete this exercise __________ times a day. *If instructed by your physician, physical therapist or athletic trainer, you may add ____________________ of resistance by placing a weighted object on your right / left knee.   This information is not intended to replace advice given to you by your health care provider. Make sure you discuss any questions you have with your health care provider.   Document Released: 11/08/2004 Document Revised: 04/30/2014 Document Reviewed: 07/22/2008 Elsevier Interactive Patient Education 2016 [employer]   Plantar Fasciitis With Rehab The plantar fascia is a fibrous, ligament-like, soft-tissue structure that spans the bottom of the foot.  Plantar fasciitis, also called heel spur syndrome, is a condition that causes pain in the foot due to inflammation of the tissue. SYMPTOMS   Pain and tenderness on the underneath side of the foot.  Pain that worsens with standing or walking. CAUSES  Plantar fasciitis is caused by irritation and injury to the plantar fascia on the underneath side of the foot. Common mechanisms of injury include:  Direct trauma to bottom of the foot.  Damage to a small nerve that runs under the foot where the main fascia attaches to the heel bone.  Stress placed on the plantar fascia due to bone spurs. RISK INCREASES WITH:   Activities that place stress on the plantar fascia (running, jumping, pivoting, or cutting).  Poor strength and flexibility.  Improperly fitted shoes.  Tight calf muscles.  Flat  feet.  Failure to warm-up properly before activity.  Obesity. PREVENTION  Warm up and stretch properly before activity.  Allow for adequate recovery between workouts.  Maintain physical fitness:  Strength, flexibility, and endurance.  Cardiovascular fitness.  Maintain a health body weight.  Avoid stress on the plantar fascia.  Wear properly fitted shoes, including arch supports for individuals who have flat feet. PROGNOSIS  If treated properly, then the symptoms of plantar fasciitis usually resolve without surgery. However, occasionally surgery is necessary. RELATED COMPLICATIONS   Recurrent symptoms that may result in a chronic condition.  Problems of the lower back that are caused by compensating for the injury, such as limping.  Pain or weakness of the foot during push-off following surgery.  Chronic inflammation, scarring, and partial or complete fascia tear, occurring more often from repeated injections. TREATMENT  Treatment initially involves the use of ice and medication to help reduce pain and inflammation. The use of strengthening and stretching exercises may help reduce pain with activity, especially stretches of the Achilles tendon. These exercises may be performed at home or with a therapist. Your caregiver may recommend that you use heel cups of arch supports to help reduce stress on the plantar fascia. Occasionally, corticosteroid injections are given to reduce inflammation. If symptoms persist for greater than 6 months despite non-surgical (conservative), then surgery may be recommended.  MEDICATION   If pain medication is necessary, then nonsteroidal anti-inflammatory medications, such as aspirin and ibuprofen, or other minor pain relievers, such as acetaminophen, are often recommended.  Do not take pain medication within 7 days before surgery.  Prescription pain relievers may be given if deemed necessary by your caregiver. Use only as directed and only as  much as you need.  Corticosteroid injections may be given by your caregiver. These injections should be reserved for the most serious cases, because they may only be given a certain number of times. HEAT AND COLD  Cold treatment (icing) relieves pain and reduces inflammation. Cold treatment should be applied for 10 to 15 minutes every 2 to 3 hours for inflammation and pain and immediately after any activity that aggravates your symptoms. Use ice packs or massage the area with a piece of ice (ice massage).  Heat treatment may be used prior to performing the stretching and strengthening activities prescribed by your caregiver, physical therapist, or athletic trainer. Use a heat pack or soak the injury in warm water. SEEK IMMEDIATE MEDICAL CARE IF:  Treatment seems to offer no benefit, or the condition worsens.  Any medications produce adverse side effects. EXERCISES RANGE OF MOTION (ROM) AND STRETCHING EXERCISES - Plantar Fasciitis (Heel Spur Syndrome) These exercises may help you  when beginning to rehabilitate your injury. Your symptoms may resolve with or without further involvement from your physician, physical therapist or athletic trainer. While completing these exercises, remember:   Restoring tissue flexibility helps normal motion to return to the joints. This allows healthier, less painful movement and activity.  An effective stretch should be held for at least 30 seconds.  A stretch should never be painful. You should only feel a gentle lengthening or release in the stretched tissue. RANGE OF MOTION - Toe Extension, Flexion  Sit with your right / left leg crossed over your opposite knee.  Grasp your toes and gently pull them back toward the top of your foot. You should feel a stretch on the bottom of your toes and/or foot.  Hold this stretch for __________ seconds.  Now, gently pull your toes toward the bottom of your foot. You should feel a stretch on the top of your toes and or  foot.  Hold this stretch for __________ seconds. Repeat __________ times. Complete this stretch __________ times per day.  RANGE OF MOTION - Ankle Dorsiflexion, Active Assisted  Remove shoes and sit on a chair that is preferably not on a carpeted surface.  Place right / left foot under knee. Extend your opposite leg for support.  Keeping your heel down, slide your right / left foot back toward the chair until you feel a stretch at your ankle or calf. If you do not feel a stretch, slide your bottom forward to the edge of the chair, while still keeping your heel down.  Hold this stretch for __________ seconds. Repeat __________ times. Complete this stretch __________ times per day.  STRETCH - Gastroc, Standing  Place hands on wall.  Extend right / left leg, keeping the front knee somewhat bent.  Slightly point your toes inward on your back foot.  Keeping your right / left heel on the floor and your knee straight, shift your weight toward the wall, not allowing your back to arch.  You should feel a gentle stretch in the right / left calf. Hold this position for __________ seconds. Repeat __________ times. Complete this stretch __________ times per day. STRETCH - Soleus, Standing  Place hands on wall.  Extend right / left leg, keeping the other knee somewhat bent.  Slightly point your toes inward on your back foot.  Keep your right / left heel on the floor, bend your back knee, and slightly shift your weight over the back leg so that you feel a gentle stretch deep in your back calf.  Hold this position for __________ seconds. Repeat __________ times. Complete this stretch __________ times per day. STRETCH - Gastrocsoleus, Standing  Note: This exercise can place a lot of stress on your foot and ankle. Please complete this exercise only if specifically instructed by your caregiver.   Place the ball of your right / left foot on a step, keeping your other foot firmly on the same  step.  Hold on to the wall or a rail for balance.  Slowly lift your other foot, allowing your body weight to press your heel down over the edge of the step.  You should feel a stretch in your right / left calf.  Hold this position for __________ seconds.  Repeat this exercise with a slight bend in your right / left knee. Repeat __________ times. Complete this stretch __________ times per day.  STRENGTHENING EXERCISES - Plantar Fasciitis (Heel Spur Syndrome)  These exercises may help you when beginning  to rehabilitate your injury. They may resolve your symptoms with or without further involvement from your physician, physical therapist or athletic trainer. While completing these exercises, remember:   Muscles can gain both the endurance and the strength needed for everyday activities through controlled exercises.  Complete these exercises as instructed by your physician, physical therapist or athletic trainer. Progress the resistance and repetitions only as guided. STRENGTH - Towel Curls  Sit in a chair positioned on a non-carpeted surface.  Place your foot on a towel, keeping your heel on the floor.  Pull the towel toward your heel by only curling your toes. Keep your heel on the floor.  If instructed by your physician, physical therapist or athletic trainer, add ____________________ at the end of the towel. Repeat __________ times. Complete this exercise __________ times per day. STRENGTH - Ankle Inversion  Secure one end of a rubber exercise band/tubing to a fixed object (table, pole). Loop the other end around your foot just before your toes.  Place your fists between your knees. This will focus your strengthening at your ankle.  Slowly, pull your big toe up and in, making sure the band/tubing is positioned to resist the entire motion.  Hold this position for __________ seconds.  Have your muscles resist the band/tubing as it slowly pulls your foot back to the starting  position. Repeat __________ times. Complete this exercises __________ times per day.    This information is not intended to replace advice given to you by your health care provider. Make sure you discuss any questions you have with your health care provider.   Document Released: 04/09/2005 Document Revised: 08/24/2014 Document Reviewed: 07/22/2008 Elsevier Interactive Patient Education [employer].

## 2015-12-01 ENCOUNTER — Encounter: Payer: Self-pay | Admitting: Podiatry

## 2015-12-01 ENCOUNTER — Ambulatory Visit (INDEPENDENT_AMBULATORY_CARE_PROVIDER_SITE_OTHER): Payer: BC Managed Care – PPO | Admitting: Podiatry

## 2015-12-01 DIAGNOSIS — M722 Plantar fascial fibromatosis: Secondary | ICD-10-CM

## 2015-12-01 DIAGNOSIS — R52 Pain, unspecified: Secondary | ICD-10-CM

## 2015-12-01 NOTE — Progress Notes (Signed)
Subjective: 38 mL she'll female presents the office today for follow-up with vibration bilateral heel pain. She states that as long as she wears her braces and a tennis shoe she has no pain when she tries to wear flat shoes she still gets pain to her heel. She's been stretching, icing as well taking anti-inflammatories. She's having no pain today on the left side however her rises somewhat uncomfortable. Denies any systemic complaints such as fevers, chills, nausea, vomiting. No acute changes since last appointment, and no other complaints at this time.   Objective: AAO x3, NAD DP/PT pulses palpable bilaterally, CRT less than 3 seconds There is continued but improved tenderness to palpation along the plantar medial tubercle of the calcaneus at the insertion of plantar fascia on the right foot. No pain on the left. There is no pain along the course of the plantar fascia within the arch of the foot. Plantar fascia appears to be intact. There is no pain with lateral compression of the calcaneus or pain with vibratory sensation. There is no pain along the course or insertion of the achilles tendon. No other areas of tenderness to bilateral lower extremities. No edema, erythema, increase in warmth to bilateral lower extremities.  No open lesions or pre-ulcerative lesions.  No pain with calf compression, swelling, warmth, erythema  Assessment: Resolving bilateral plantar fasciitis with continued symptoms on the right side  Plan: -All treatment options discussed with the patient including all alternatives, risks, complications.  -Patient elects to proceed with steroid injection into the right heel. Under sterile skin preparation, a total of 2.5cc of kenalog 10, 0.5% Marcaine plain, and 2% lidocaine plain were infiltrated into the symptomatic area without complication. A band-aid was applied. Patient tolerated the injection well without complication. Post-injection care with discussed with the patient.  Discussed with the patient to ice the area over the next couple of days to help prevent a steroid flare.  -Continue stretching, icing exercises today. Continue plantar fascial brace as well as night splint. I discussed with her shoe gear modifications and when she wears a flat shoe to wear one  the has an arch support built in. -Follow up with symptoms continue in 4 weeks or sooner if any issues are to arise.  Celesta Gentile, DPM

## 2015-12-09 ENCOUNTER — Telehealth: Payer: Self-pay | Admitting: *Deleted

## 2015-12-09 MED ORDER — MELOXICAM 15 MG PO TABS: 15.0000 mg | ORAL_TABLET | Freq: Every day | ORAL | 0 refills | Status: DC

## 2015-12-09 NOTE — Telephone Encounter (Signed)
Received request for 90 day supply of Meloxicam 15mg . Okayed for 90 doses without refills.

## 2015-12-19 ENCOUNTER — Other Ambulatory Visit: Payer: Self-pay | Admitting: Family Medicine

## 2015-12-19 DIAGNOSIS — M797 Fibromyalgia: Secondary | ICD-10-CM

## 2016-01-01 ENCOUNTER — Other Ambulatory Visit: Payer: Self-pay | Admitting: Family Medicine

## 2016-01-01 DIAGNOSIS — M797 Fibromyalgia: Secondary | ICD-10-CM

## 2016-01-03 ENCOUNTER — Telehealth: Payer: Self-pay | Admitting: Internal Medicine

## 2016-01-03 DIAGNOSIS — M797 Fibromyalgia: Secondary | ICD-10-CM

## 2016-01-03 MED ORDER — GABAPENTIN 800 MG PO TABS: ORAL_TABLET | ORAL | 1 refills | Status: DC

## 2016-01-03 NOTE — Telephone Encounter (Signed)
This was refilled by Dr. Venia Minks prior, in February, Last OV was in March, Please advise

## 2016-01-03 NOTE — Telephone Encounter (Signed)
Refilled

## 2016-01-03 NOTE — Telephone Encounter (Signed)
Pt called about needing a refill for gabapentin (NEURONTIN) 800 MG tablet  Pharmacy is [id number] - [place], [place]  Call pt @ [phone] leave msg. Thank you!

## 2016-01-05 ENCOUNTER — Ambulatory Visit: Payer: BC Managed Care – PPO | Admitting: Podiatry

## 2016-01-19 ENCOUNTER — Encounter: Payer: BC Managed Care – PPO | Admitting: Family Medicine

## 2016-03-08 ENCOUNTER — Other Ambulatory Visit: Payer: Self-pay | Admitting: Podiatry

## 2016-03-09 ENCOUNTER — Other Ambulatory Visit: Payer: Self-pay | Admitting: Podiatry

## 2016-03-09 NOTE — Telephone Encounter (Signed)
Pt needs an appt prior to future refills. 

## 2016-07-09 ENCOUNTER — Other Ambulatory Visit: Payer: Self-pay | Admitting: Internal Medicine

## 2016-07-09 DIAGNOSIS — E7800 Pure hypercholesterolemia, unspecified: Secondary | ICD-10-CM

## 2016-07-09 DIAGNOSIS — R7301 Impaired fasting glucose: Secondary | ICD-10-CM

## 2016-07-09 DIAGNOSIS — M797 Fibromyalgia: Secondary | ICD-10-CM

## 2016-07-09 NOTE — Telephone Encounter (Signed)
Last OV 07/22/15 and last labs please advise as to refill?

## 2016-07-10 NOTE — Telephone Encounter (Signed)
Refill for 60 days only.  OFFICE VISIT and fasting labs NEEDED prior to any more refills

## 2016-07-12 NOTE — Telephone Encounter (Signed)
Left message for patient to return call to office. 

## 2016-07-30 ENCOUNTER — Ambulatory Visit (INDEPENDENT_AMBULATORY_CARE_PROVIDER_SITE_OTHER): Payer: BC Managed Care – PPO | Admitting: Internal Medicine

## 2016-07-30 ENCOUNTER — Encounter: Payer: Self-pay | Admitting: Internal Medicine

## 2016-07-30 DIAGNOSIS — H60332 Swimmer's ear, left ear: Secondary | ICD-10-CM | POA: Diagnosis not present

## 2016-07-30 MED ORDER — CLOTRIMAZOLE 1 % EX SOLN: 1.0000 "application " | Freq: Two times a day (BID) | CUTANEOUS | 0 refills | Status: DC

## 2016-07-30 NOTE — Progress Notes (Signed)
Pre visit review using our clinic review tool, if applicable. No additional management support is needed unless otherwise documented below in the visit note. 

## 2016-07-30 NOTE — Assessment & Plan Note (Addendum)
Clotrimazole 1% solution bid  Prescribed. If no improvement in 48 hours,  See ENT

## 2016-07-30 NOTE — Progress Notes (Signed)
Subjective:  Patient ID: Gwendolyn [name], female    DOB: 1971-03-26  Age: 46 y.o. MRN: [id number]  CC: The encounter diagnosis was Acute swimmer's ear of left side.  HPI Gwendolyn [name] presents for evaluation of left ear pain that has been present for 48 hours. Symptoms of fullness and itching started after a snorkeling trip last Monday while [place] in the [place], states that she felt that she couldn't get all the water out of her ear.   Pain started two days ago, was not aggravated by flying,  But now has mild sore throat,  Sinus congestion, and pain in front of ear and on side of head. Denies fevers, chills,  feelse "hung over " and tired fr a week of partying [place] the cruise [place].      She Has had two prior episdoes of otitis  Resulting from prior water recreational trips that she notes did not resolve after 2 rounds of antbiotics and were finally treated as  fungal ear infections by ENT with topical antifungals . Doesn't remember name of drug,  Last episode over 4 years ago.    Outpatient Medications Prior to Visit  Medication Sig Dispense Refill  . amphetamine-dextroamphetamine (ADDERALL XR) 20 MG 24 hr capsule Take by mouth.     . DULoxetine (CYMBALTA) 60 MG capsule Take 60 mg by mouth daily.    Marland Kitchen gabapentin (NEURONTIN) 800 MG tablet TAKE 1 TABLET (800 MG TOTAL) BY MOUTH NIGHTLY. 90 tablet 1  . Ginkgo Biloba 40 MG TABS Take by mouth.    . hydrocortisone (ANUSOL-HC) 25 MG suppository Place 1 suppository (25 mg total) rectally 2 (two) times daily. 12 suppository 11  . Melatonin 3 MG TABS Take 1 tablet by mouth at bedtime.    . meloxicam (MOBIC) 15 MG tablet TAKE 1 TABLET (15 MG TOTAL) BY MOUTH DAILY. 30 tablet 0  . metroNIDAZOLE (METROGEL VAGINAL) 0.75 % vaginal gel Place 1 Applicatorful vaginally 2 (two) times daily. 70 g 0  . scopolamine (TRANSDERM-SCOP) 1 MG/3DAYS Place 1 patch (1.5 mg total) onto the skin every 3 (three) days. 10 patch 1  . tiaGABine (GABITRIL) 4  MG tablet Take by mouth. Reported on 07/22/2015    . zolpidem (AMBIEN) 10 MG tablet Take 10 mg by mouth at bedtime as needed for sleep.    . ARIPiprazole (ABILIFY) 5 MG tablet Take 5 mg by mouth daily.  1  . levofloxacin (LEVAQUIN) 500 MG tablet Take 1 tablet (500 mg total) by mouth daily. (Patient not taking: Reported on 07/30/2016) 7 tablet 0  . methylPREDNISolone (MEDROL DOSEPAK) 4 MG TBPK tablet Take as directed (Patient not taking: Reported on 07/30/2016) 21 tablet 0  . predniSONE (DELTASONE) 10 MG tablet 6 tablets on Day 1 , then reduce by 1 tablet daily until gone (Patient not taking: Reported on 07/30/2016) 21 tablet 0  . terconazole (TERAZOL 7) 0.4 % vaginal cream Place 1 applicator vaginally at bedtime. For 14 days. (Patient not taking: Reported on 07/30/2016) 90 g 3   No facility-administered medications prior to visit.     Review of Systems;  Patient denies , fevers, unintentional weight loss, skin rash, eye pain,  sinus pain, sore throat, dysphagia,  hemoptysis , cough, dyspnea, wheezing, chest pain, palpitations, orthopnea, edema, abdominal pain, nausea, melena, diarrhea, constipation, flank pain, dysuria, hematuria, urinary  Frequency, nocturia, numbness, tingling, seizures,  Focal weakness, Loss of consciousness,  Tremor, insomnia, depression, anxiety, and suicidal ideation.      Objective:  BP  110/68   Pulse 86   Temp 97.7 F (36.5 C) (Oral)   Wt 181 lb (82.1 kg)   SpO2 97%   BMI 29.21 kg/m   BP Readings from Last 3 Encounters:  07/30/16 110/68  07/22/15 120/70  06/13/15 102/64    Wt Readings from Last 3 Encounters:  07/30/16 181 lb (82.1 kg)  07/22/15 175 lb 8 oz (79.6 kg)  06/13/15 178 lb (80.7 kg)    General appearance: appears less ill than  tired  Ears: normal TM's bilaterally,  Left external canal red and flaking . Throat: lips, mucosa, and tongue normal; teeth and gums normal Neck: no adenopathy, no carotid bruit, supple, symmetrical, trachea midline and  thyroid not enlarged, symmetric, no tenderness/mass/nodules Lungs: clear to auscultation bilaterally Heart: regular rate and rhythm, S1, S2 normal, no murmur, click, rub or gallop Skin: Skin color, texture, turgor normal. No rashes or lesions Lymph nodes: tender preauricular node on left,  Cervical, supraclavicular, and axillary nodes normal.  Lab Results  Component Value Date   HGBA1C 5.7 07/22/2015   HGBA1C 5.5 10/07/2014    Lab Results  Component Value Date   CREATININE 0.78 10/07/2014    Lab Results  Component Value Date   WBC 5.9 10/07/2014   HGB 12.8 08/08/2013   HCT 37.9 10/07/2014   PLT 272 10/07/2014   GLUCOSE 95 10/07/2014   CHOL 206 (H) 10/07/2014   TRIG 113 10/07/2014   HDL 68 10/07/2014   LDLCALC 115 (H) 10/07/2014   ALT 18 10/07/2014   AST 21 10/07/2014   NA 141 10/07/2014   K 4.2 10/07/2014   CL 101 10/07/2014   CREATININE 0.78 10/07/2014   BUN 9 10/07/2014   CO2 26 10/07/2014   TSH 1.980 07/22/2015   HGBA1C 5.7 07/22/2015     Assessment & Plan:   Problem List Items Addressed This Visit    Swimmer's ear of left side    Clotrimazole 1% solution bid  Prescribed. If no improvement in 48 hours,  See ENT          I am having Ms. Mcinerny start on clotrimazole. I am also having her maintain her amphetamine-dextroamphetamine, zolpidem, tiaGABine, Melatonin, DULoxetine, Ginkgo Biloba, terconazole, metroNIDAZOLE, hydrocortisone, scopolamine, ARIPiprazole, levofloxacin, predniSONE, methylPREDNISolone, meloxicam, and gabapentin.  Meds ordered this encounter  Medications  . clotrimazole (LOTRIMIN) 1 % external solution    Sig: Apply 1 application topically 2 (two) times daily.    Dispense:  30 mL    Refill:  0    There are no discontinued medications.  Follow-up: No Follow-up on file.   [name], MD

## 2016-08-29 ENCOUNTER — Encounter: Payer: Self-pay | Admitting: Internal Medicine

## 2016-08-29 ENCOUNTER — Ambulatory Visit (INDEPENDENT_AMBULATORY_CARE_PROVIDER_SITE_OTHER): Payer: BC Managed Care – PPO | Admitting: Internal Medicine

## 2016-08-29 VITALS — BP 116/72 | HR 88 | Temp 98.0°F | Resp 15 | Ht 66.0 in | Wt 181.2 lb

## 2016-08-29 DIAGNOSIS — E663 Overweight: Secondary | ICD-10-CM | POA: Diagnosis not present

## 2016-08-29 DIAGNOSIS — E559 Vitamin D deficiency, unspecified: Secondary | ICD-10-CM

## 2016-08-29 DIAGNOSIS — H60332 Swimmer's ear, left ear: Secondary | ICD-10-CM

## 2016-08-29 DIAGNOSIS — M797 Fibromyalgia: Secondary | ICD-10-CM

## 2016-08-29 DIAGNOSIS — Z9049 Acquired absence of other specified parts of digestive tract: Secondary | ICD-10-CM | POA: Diagnosis not present

## 2016-08-29 DIAGNOSIS — K644 Residual hemorrhoidal skin tags: Secondary | ICD-10-CM | POA: Diagnosis not present

## 2016-08-29 DIAGNOSIS — Z780 Asymptomatic menopausal state: Secondary | ICD-10-CM | POA: Diagnosis not present

## 2016-08-29 DIAGNOSIS — F988 Other specified behavioral and emotional disorders with onset usually occurring in childhood and adolescence: Secondary | ICD-10-CM

## 2016-08-29 DIAGNOSIS — F329 Major depressive disorder, single episode, unspecified: Secondary | ICD-10-CM

## 2016-08-29 DIAGNOSIS — R5383 Other fatigue: Secondary | ICD-10-CM | POA: Diagnosis not present

## 2016-08-29 DIAGNOSIS — Z853 Personal history of malignant neoplasm of breast: Secondary | ICD-10-CM | POA: Diagnosis not present

## 2016-08-29 DIAGNOSIS — E785 Hyperlipidemia, unspecified: Secondary | ICD-10-CM

## 2016-08-29 DIAGNOSIS — F32A Depression, unspecified: Secondary | ICD-10-CM

## 2016-08-29 MED ORDER — HYDROCORTISONE ACETATE 25 MG RE SUPP: 25.0000 mg | Freq: Two times a day (BID) | RECTAL | 11 refills | Status: DC

## 2016-08-29 MED ORDER — LEVOFLOXACIN 500 MG PO TABS: 500.0000 mg | ORAL_TABLET | Freq: Every day | ORAL | 0 refills | Status: DC

## 2016-08-29 MED ORDER — GABAPENTIN 800 MG PO TABS: ORAL_TABLET | ORAL | 3 refills | Status: DC

## 2016-08-29 NOTE — Patient Instructions (Addendum)
ZTake the levaquin with you to the [place] ,  And tell robert to take a probiotic ( if he  takes the antibiotic,  to prevent c dif )   Herbie Baltimore should be taking 2000 Ius of D3 daily,,  So should you !   I will order fasting labs at [facility] to have done prior to your appointment

## 2016-08-29 NOTE — Progress Notes (Signed)
Subjective:  Patient ID: Gwendolyn [name], female    DOB: [birth year]  Age: 46 y.o. MRN: [id number]  CC: The primary encounter diagnosis was History of breast cancer. Diagnoses of Fibromyalgia, External hemorrhoid, Fatigue, unspecified type, Vitamin D deficiency, Hyperlipidemia LDL goal <100, Menopause, Acute swimmer's ear of left side, Attention deficit disorder (ADD) without hyperactivity, Depressive disorder, S/P colectomy, and Overweight were also pertinent to this visit.  HPI Gwendolyn [name] presents for follow up on chronic  Issues including ADD, recent treatment for swimmer's ear which occurred aftera a snorkeling trip to the [place].     Swimmer's ear resolved   Trip to [place] in June I 2015  5 or 6 semi soft stools daily   History of colonic inertia  s/p total colectomy  Last PAP sept 2016 normal.   hjstory of abnormals.        Outpatient Medications Prior to Visit  Medication Sig Dispense Refill  . amphetamine-dextroamphetamine (ADDERALL XR) 20 MG 24 hr capsule Take by mouth.     . DULoxetine (CYMBALTA) 60 MG capsule Take 60 mg by mouth daily.    . Ginkgo Biloba 40 MG TABS Take by mouth.    . Melatonin 3 MG TABS Take 1 tablet by mouth at bedtime.    . tiaGABine (GABITRIL) 4 MG tablet Take by mouth. Reported on 07/22/2015    . zolpidem (AMBIEN) 10 MG tablet Take 10 mg by mouth at bedtime as needed for sleep.    Marland Kitchen gabapentin (NEURONTIN) 800 MG tablet TAKE 1 TABLET (800 MG TOTAL) BY MOUTH NIGHTLY. 90 tablet 1  . hydrocortisone (ANUSOL-HC) 25 MG suppository Place 1 suppository (25 mg total) rectally 2 (two) times daily. 12 suppository 11  . ARIPiprazole (ABILIFY) 5 MG tablet Take 5 mg by mouth daily.  1  . meloxicam (MOBIC) 15 MG tablet TAKE 1 TABLET (15 MG TOTAL) BY MOUTH DAILY. (Patient not taking: Reported on 08/29/2016) 30 tablet 0  . clotrimazole (LOTRIMIN) 1 % external solution Apply 1 application topically 2 (two) times daily. (Patient not taking: Reported on  08/29/2016) 30 mL 0  . levofloxacin (LEVAQUIN) 500 MG tablet Take 1 tablet (500 mg total) by mouth daily. (Patient not taking: Reported on 08/29/2016) 7 tablet 0  . methylPREDNISolone (MEDROL DOSEPAK) 4 MG TBPK tablet Take as directed (Patient not taking: Reported on 07/30/2016) 21 tablet 0  . metroNIDAZOLE (METROGEL VAGINAL) 0.75 % vaginal gel Place 1 Applicatorful vaginally 2 (two) times daily. (Patient not taking: Reported on 08/29/2016) 70 g 0  . predniSONE (DELTASONE) 10 MG tablet 6 tablets on Day 1 , then reduce by 1 tablet daily until gone (Patient not taking: Reported on 07/30/2016) 21 tablet 0  . scopolamine (TRANSDERM-SCOP) 1 MG/3DAYS Place 1 patch (1.5 mg total) onto the skin every 3 (three) days. (Patient not taking: Reported on 08/29/2016) 10 patch 1  . terconazole (TERAZOL 7) 0.4 % vaginal cream Place 1 applicator vaginally at bedtime. For 14 days. (Patient not taking: Reported on 07/30/2016) 90 g 3   No facility-administered medications prior to visit.     Review of Systems;  Patient denies headache, fevers, malaise, unintentional weight loss, skin rash, eye pain, sinus congestion and sinus pain, sore throat, dysphagia,  hemoptysis , cough, dyspnea, wheezing, chest pain, palpitations, orthopnea, edema, abdominal pain, nausea, melena, diarrhea, constipation, flank pain, dysuria, hematuria, urinary  Frequency, nocturia, numbness, tingling, seizures,  Focal weakness, Loss of consciousness,  Tremor, insomnia, depression, anxiety, and suicidal ideation.      Objective:  BP 116/72 (BP Location: Left Arm, Patient Position: Sitting, Cuff Size: Normal)   Pulse 88   Temp 98 F (36.7 C) (Oral)   Resp 15   Ht 5\' 6"  (1.676 m)   Wt 181 lb 3.2 oz (82.2 kg)   SpO2 98%   BMI 29.25 kg/m   BP Readings from Last 3 Encounters:  08/29/16 116/72  07/30/16 110/68  07/22/15 120/70    Wt Readings from Last 3 Encounters:  08/29/16 181 lb 3.2 oz (82.2 kg)  07/30/16 181 lb (82.1 kg)  07/22/15 175 lb 8 oz  (79.6 kg)    General appearance: alert, cooperative and appears stated age Ears: normal TM's and external ear canals both ears Throat: lips, mucosa, and tongue normal; teeth and gums normal Neck: no adenopathy, no carotid bruit, supple, symmetrical, trachea midline and thyroid not enlarged, symmetric, no tenderness/mass/nodules Back: symmetric, no curvature. ROM normal. No CVA tenderness. Lungs: clear to auscultation bilaterally Heart: regular rate and rhythm, S1, S2 normal, no murmur, click, rub or gallop Abdomen: soft, non-tender; bowel sounds normal; no masses,  no organomegaly Pulses: 2+ and symmetric Skin: Skin color, texture, turgor normal. No rashes or lesions Lymph nodes: Cervical, supraclavicular, and axillary nodes normal.  Lab Results  Component Value Date   HGBA1C 5.7 07/22/2015   HGBA1C 5.5 10/07/2014    Lab Results  Component Value Date   CREATININE 0.78 10/07/2014    Lab Results  Component Value Date   WBC 5.9 10/07/2014   HGB 12.8 08/08/2013   HCT 37.9 10/07/2014   PLT 272 10/07/2014   GLUCOSE 95 10/07/2014   CHOL 206 (H) 10/07/2014   TRIG 113 10/07/2014   HDL 68 10/07/2014   LDLCALC 115 (H) 10/07/2014   ALT 18 10/07/2014   AST 21 10/07/2014   NA 141 10/07/2014   K 4.2 10/07/2014   CL 101 10/07/2014   CREATININE 0.78 10/07/2014   BUN 9 10/07/2014   CO2 26 10/07/2014   TSH 1.980 07/22/2015   HGBA1C 5.7 07/22/2015      Assessment & Plan:   Problem List Items Addressed This Visit    Vitamin D deficiency    Advised to take 20000 Ius daily until level can be checked       Relevant Orders   VITAMIN D 25 Hydroxy (Vit-D Deficiency, Fractures)   Swimmer's ear of left side    Fully resolvled .  ENT added prednisone to my regimen.       S/P colectomy    Done in 2014, secondary to colonic inertia.  She does NOT have a colostomy       Overweight    She is exercising semi regularly and trying to follow a low GI diet.       Menopause    Relevant Orders   Follicle stimulating hormone   LH   Fibromyalgia   Relevant Medications   gabapentin (NEURONTIN) 800 MG tablet   External hemorrhoid   Relevant Medications   hydrocortisone (ANUSOL-HC) 25 MG suppository   Depressive disorder    Managed with cymbalta by her psychiatrist.  She is stable       Attention deficit disorder (ADD) without hyperactivity    Managed by her psychiatrist with adderall        Other Visit Diagnoses    History of breast cancer    -  Primary   Relevant Orders   MM Digital Diagnostic Bilat   Fatigue, unspecified type       Relevant  Orders   Comprehensive metabolic panel   CBC with Differential/Platelet   TSH   Hepatitis C antibody   Hyperlipidemia LDL goal <100       Relevant Orders   Lipid panel     A total of 25 minutes of face to face time was spent with patient more than half of which was spent in counselling about the above mentioned conditions  and coordination of care  I have discontinued Ms. Schlechter's terconazole, metroNIDAZOLE, scopolamine, levofloxacin, predniSONE, methylPREDNISolone, and clotrimazole. I am also having her start on levofloxacin. Additionally, I am having her maintain her amphetamine-dextroamphetamine, zolpidem, tiaGABine, Melatonin, DULoxetine, Ginkgo Biloba, ARIPiprazole, meloxicam, gabapentin, and hydrocortisone.  Meds ordered this encounter  Medications  . levofloxacin (LEVAQUIN) 500 MG tablet    Sig: Take 1 tablet (500 mg total) by mouth daily.    Dispense:  7 tablet    Refill:  0  . gabapentin (NEURONTIN) 800 MG tablet    Sig: TAKE 1 TABLET (800 MG TOTAL) BY MOUTH NIGHTLY.    Dispense:  90 tablet    Refill:  3  . hydrocortisone (ANUSOL-HC) 25 MG suppository    Sig: Place 1 suppository (25 mg total) rectally 2 (two) times daily.    Dispense:  12 suppository    Refill:  11    Medications Discontinued During This Encounter  Medication Reason  . clotrimazole (LOTRIMIN) 1 % external solution  Patient has not taken in last 30 days  . levofloxacin (LEVAQUIN) 500 MG tablet Therapy completed  . methylPREDNISolone (MEDROL DOSEPAK) 4 MG TBPK tablet Therapy completed  . metroNIDAZOLE (METROGEL VAGINAL) 0.75 % vaginal gel Therapy completed  . predniSONE (DELTASONE) 10 MG tablet Therapy completed  . scopolamine (TRANSDERM-SCOP) 1 MG/3DAYS Therapy completed  . terconazole (TERAZOL 7) 0.4 % vaginal cream Therapy completed  . gabapentin (NEURONTIN) 800 MG tablet Reorder  . hydrocortisone (ANUSOL-HC) 25 MG suppository Reorder    Follow-up: No Follow-up on file.   [name], MD

## 2016-09-01 NOTE — Assessment & Plan Note (Addendum)
Done in 2014, secondary to colonic inertia.  She does NOT have a colostomy

## 2016-09-01 NOTE — Assessment & Plan Note (Signed)
Managed with cymbalta by her psychiatrist.  She is stable

## 2016-09-01 NOTE — Assessment & Plan Note (Signed)
She is exercising semi regularly and trying to follow a low GI diet.

## 2016-09-01 NOTE — Assessment & Plan Note (Signed)
Managed by her psychiatrist with adderall

## 2016-09-01 NOTE — Assessment & Plan Note (Signed)
Advised to take 20000 Ius daily until level can be checked

## 2016-09-01 NOTE — Assessment & Plan Note (Signed)
Fully resolvled .  ENT added prednisone to my regimen.

## 2016-09-28 ENCOUNTER — Telehealth: Payer: Self-pay | Admitting: Internal Medicine

## 2016-09-28 DIAGNOSIS — R928 Other abnormal and inconclusive findings on diagnostic imaging of breast: Secondary | ICD-10-CM

## 2016-09-28 NOTE — Telephone Encounter (Signed)
Pt called and stated that [facility] cannot do her diagnostic mammogram, and needs to go elsewhere. Pt would prefer somewhere in [place]. Please advise, thank you!

## 2016-10-01 NOTE — Telephone Encounter (Signed)
Please enter right and left limited ultrasound's for patient's bilateral diagnostic mammogram [id number]  Thanks, Lenna Sciara

## 2016-10-01 NOTE — Telephone Encounter (Signed)
Thank you :)

## 2016-10-01 NOTE — Telephone Encounter (Signed)
The ultrasounds and the diagnostic have all been ordered.

## 2016-10-03 NOTE — Telephone Encounter (Signed)
[facility]. They are requesting her images from [facility]. Once they receive them they will contact me to schedule.  I have sent mychart message to patient stating this information.

## 2016-10-09 ENCOUNTER — Inpatient Hospital Stay
Admission: RE | Admit: 2016-10-09 | Discharge: 2016-10-09 | Disposition: A | Discharge status: Home / Self Care | Payer: Self-pay | Source: Ambulatory Visit | Attending: *Deleted | Admitting: *Deleted

## 2016-10-09 ENCOUNTER — Other Ambulatory Visit: Payer: Self-pay | Admitting: *Deleted

## 2016-10-09 DIAGNOSIS — Z9289 Personal history of other medical treatment: Secondary | ICD-10-CM

## 2016-10-22 ENCOUNTER — Telehealth: Payer: Self-pay

## 2016-10-22 DIAGNOSIS — Z1239 Encounter for other screening for malignant neoplasm of breast: Secondary | ICD-10-CM

## 2016-10-22 NOTE — Telephone Encounter (Signed)
-----   Message from Eustace Pen sent at 10/22/2016  8:10 AM EDT ----- Regarding: screening mammogram Graylon Good, Will you enter a screening mammogram for this patient? Since I can't find anything regarding a new problem then I'm just going to schedule a screening for her. Thank you! Melissa

## 2016-10-22 NOTE — Telephone Encounter (Signed)
Screening mammogram ordered

## 2016-11-02 LAB — COMPREHENSIVE METABOLIC PANEL
ALT: 11 IU/L (ref 0–32)
AST: 17 IU/L (ref 0–40)
Albumin/Globulin Ratio: 1.7 (ref 1.2–2.2)
Albumin: 4.3 g/dL (ref 3.5–5.5)
Alkaline Phosphatase: 38 IU/L — ABNORMAL LOW (ref 39–117)
BUN/Creatinine Ratio: 14 (ref 9–23)
BUN: 11 mg/dL (ref 6–24)
Bilirubin Total: 0.6 mg/dL (ref 0.0–1.2)
CO2: 25 mmol/L (ref 20–29)
Calcium: 9.2 mg/dL (ref 8.7–10.2)
Chloride: 101 mmol/L (ref 96–106)
Creatinine, Ser: 0.77 mg/dL (ref 0.57–1.00)
GFR calc Af Amer: 106 mL/min/{1.73_m2} (ref >59)
GFR calc non Af Amer: 92 mL/min/{1.73_m2} (ref >59)
Globulin, Total: 2.5 g/dL (ref 1.5–4.5)
Glucose: 99 mg/dL (ref 65–99)
Potassium: 4.3 mmol/L (ref 3.5–5.2)
Sodium: 143 mmol/L (ref 134–144)
Total Protein: 6.8 g/dL (ref 6.0–8.5)

## 2016-11-02 LAB — LIPID PANEL
CHOL/HDL RATIO: 4.1 ratio (ref 0.0–4.4)
Cholesterol, Total: 203 mg/dL — ABNORMAL HIGH (ref 100–199)
HDL: 49 mg/dL (ref >39)
LDL CALC: 118 mg/dL — AB (ref 0–99)
Triglycerides: 181 mg/dL — ABNORMAL HIGH (ref 0–149)
VLDL Cholesterol Cal: 36 mg/dL (ref 5–40)

## 2016-11-02 LAB — TSH: TSH: 1.35 u[IU]/mL (ref 0.450–4.500)

## 2016-11-02 LAB — CBC WITH DIFFERENTIAL/PLATELET
Basophils Absolute: 0 10*3/uL (ref 0.0–0.2)
Basos: 0 %
EOS (ABSOLUTE): 0.1 10*3/uL (ref 0.0–0.4)
Eos: 1 %
Hematocrit: 42.5 % (ref 34.0–46.6)
Hemoglobin: 14.2 g/dL (ref 11.1–15.9)
Immature Grans (Abs): 0 10*3/uL (ref 0.0–0.1)
Immature Granulocytes: 0 %
Lymphocytes Absolute: 1.7 10*3/uL (ref 0.7–3.1)
Lymphs: 25 %
MCH: 30.7 pg (ref 26.6–33.0)
MCHC: 33.4 g/dL (ref 31.5–35.7)
MCV: 92 fL (ref 79–97)
Monocytes Absolute: 0.4 10*3/uL (ref 0.1–0.9)
Monocytes: 5 %
Neutrophils Absolute: 4.8 10*3/uL (ref 1.4–7.0)
Neutrophils: 69 %
Platelets: 280 10*3/uL (ref 150–379)
RBC: 4.63 x10E6/uL (ref 3.77–5.28)
RDW: 13.6 % (ref 12.3–15.4)
WBC: 7 10*3/uL (ref 3.4–10.8)

## 2016-11-02 LAB — FOLLICLE STIMULATING HORMONE: FSH: 3.4 m[IU]/mL

## 2016-11-02 LAB — LUTEINIZING HORMONE: LH: 7.7 m[IU]/mL

## 2016-11-02 LAB — HEPATITIS C ANTIBODY: Hep C Virus Ab: <0.1 s/co ratio (ref 0.0–0.9)

## 2016-11-02 LAB — VITAMIN D 25 HYDROXY (VIT D DEFICIENCY, FRACTURES): Vit D, 25-Hydroxy: 59.9 ng/mL (ref 30.0–100.0)

## 2016-11-04 ENCOUNTER — Encounter: Payer: Self-pay | Admitting: Internal Medicine

## 2016-11-14 ENCOUNTER — Emergency Department
Admission: EM | Admit: 2016-11-14 | Discharge: 2016-11-14 | Disposition: A | Discharge status: Home / Self Care | Payer: BC Managed Care – PPO | Attending: Emergency Medicine | Admitting: Emergency Medicine

## 2016-11-14 DIAGNOSIS — Z79899 Other long term (current) drug therapy: Secondary | ICD-10-CM | POA: Diagnosis not present

## 2016-11-14 DIAGNOSIS — R197 Diarrhea, unspecified: Secondary | ICD-10-CM

## 2016-11-14 DIAGNOSIS — K529 Noninfective gastroenteritis and colitis, unspecified: Secondary | ICD-10-CM | POA: Insufficient documentation

## 2016-11-14 DIAGNOSIS — Z87891 Personal history of nicotine dependence: Secondary | ICD-10-CM | POA: Insufficient documentation

## 2016-11-14 DIAGNOSIS — R110 Nausea: Secondary | ICD-10-CM | POA: Diagnosis not present

## 2016-11-14 DIAGNOSIS — R109 Unspecified abdominal pain: Secondary | ICD-10-CM | POA: Diagnosis present

## 2016-11-14 DIAGNOSIS — R1084 Generalized abdominal pain: Secondary | ICD-10-CM | POA: Insufficient documentation

## 2016-11-14 LAB — GASTROINTESTINAL PANEL BY PCR, STOOL (REPLACES STOOL CULTURE)
ASTROVIRUS: NOT DETECTED
Adenovirus F40/41: NOT DETECTED
CAMPYLOBACTER SPECIES: NOT DETECTED
CYCLOSPORA CAYETANENSIS: NOT DETECTED
Cryptosporidium: NOT DETECTED
ENTAMOEBA HISTOLYTICA: NOT DETECTED
ENTEROTOXIGENIC E COLI (ETEC): NOT DETECTED
Enteroaggregative E coli (EAEC): NOT DETECTED
Enteropathogenic E coli (EPEC): NOT DETECTED
Giardia lamblia: NOT DETECTED
NOROVIRUS GI/GII: NOT DETECTED
PLESIMONAS SHIGELLOIDES: NOT DETECTED
Rotavirus A: NOT DETECTED
SALMONELLA SPECIES: NOT DETECTED
SAPOVIRUS (I, II, IV, AND V): NOT DETECTED
SHIGA LIKE TOXIN PRODUCING E COLI (STEC): NOT DETECTED
SHIGELLA/ENTEROINVASIVE E COLI (EIEC): NOT DETECTED
VIBRIO CHOLERAE: NOT DETECTED
Vibrio species: NOT DETECTED
Yersinia enterocolitica: NOT DETECTED

## 2016-11-14 LAB — COMPREHENSIVE METABOLIC PANEL
ALK PHOS: 35 U/L — AB (ref 38–126)
ALT: 29 U/L (ref 14–54)
AST: 35 U/L (ref 15–41)
Albumin: 3.8 g/dL (ref 3.5–5.0)
Anion gap: 8 (ref 5–15)
BILIRUBIN TOTAL: 0.6 mg/dL (ref 0.3–1.2)
BUN: 10 mg/dL (ref 6–20)
CALCIUM: 8.9 mg/dL (ref 8.9–10.3)
CO2: 22 mmol/L (ref 22–32)
CREATININE: 0.75 mg/dL (ref 0.44–1.00)
Chloride: 105 mmol/L (ref 101–111)
GFR calc Af Amer: >60 mL/min (ref >60)
GLUCOSE: 109 mg/dL — AB (ref 65–99)
POTASSIUM: 3.8 mmol/L (ref 3.5–5.1)
Sodium: 135 mmol/L (ref 135–145)
TOTAL PROTEIN: 7.6 g/dL (ref 6.5–8.1)

## 2016-11-14 LAB — URINALYSIS, COMPLETE (UACMP) WITH MICROSCOPIC
BILIRUBIN URINE: NEGATIVE
Glucose, UA: NEGATIVE mg/dL
Ketones, ur: NEGATIVE mg/dL
LEUKOCYTES UA: NEGATIVE
NITRITE: NEGATIVE
PH: 5 (ref 5.0–8.0)
Protein, ur: NEGATIVE mg/dL
SPECIFIC GRAVITY, URINE: 1.021 (ref 1.005–1.030)

## 2016-11-14 LAB — MAGNESIUM: MAGNESIUM: 2 mg/dL (ref 1.7–2.4)

## 2016-11-14 LAB — CBC
HCT: 41.5 % (ref 35.0–47.0)
Hemoglobin: 13.9 g/dL (ref 12.0–16.0)
MCH: 30 pg (ref 26.0–34.0)
MCHC: 33.6 g/dL (ref 32.0–36.0)
MCV: 89.3 fL (ref 80.0–100.0)
PLATELETS: 281 10*3/uL (ref 150–440)
RBC: 4.64 MIL/uL (ref 3.80–5.20)
RDW: 13.5 % (ref 11.5–14.5)
WBC: 14.2 10*3/uL — AB (ref 3.6–11.0)

## 2016-11-14 LAB — C DIFFICILE QUICK SCREEN W PCR REFLEX
C DIFFICILE (CDIFF) TOXIN: NEGATIVE
C DIFFICLE (CDIFF) ANTIGEN: NEGATIVE
C Diff interpretation: NOT DETECTED

## 2016-11-14 LAB — LIPASE, BLOOD: Lipase: 41 U/L (ref 11–51)

## 2016-11-14 MED ORDER — KETOROLAC TROMETHAMINE 30 MG/ML IJ SOLN
15.0000 mg | Freq: Once | INTRAMUSCULAR | Status: AC
  Administered 2016-11-14: 15 mg via INTRAVENOUS
  Filled 2016-11-14: qty 1

## 2016-11-14 MED ORDER — IBUPROFEN 600 MG PO TABS: 600.0000 mg | ORAL_TABLET | Freq: Four times a day (QID) | ORAL | 0 refills | Status: DC | PRN

## 2016-11-14 MED ORDER — ONDANSETRON HCL 4 MG/2ML IJ SOLN
4.0000 mg | Freq: Once | INTRAMUSCULAR | Status: DC
  Filled 2016-11-14: qty 2

## 2016-11-14 MED ORDER — ONDANSETRON HCL 4 MG PO TABS: 4.0000 mg | ORAL_TABLET | Freq: Three times a day (TID) | ORAL | 0 refills | Status: DC | PRN

## 2016-11-14 MED ORDER — SODIUM CHLORIDE 0.9 % IV BOLUS (SEPSIS)
1000.0000 mL | Freq: Once | INTRAVENOUS | Status: AC
  Administered 2016-11-14: 1000 mL via INTRAVENOUS

## 2016-11-14 NOTE — ED Provider Notes (Signed)
[facility] Emergency Department Provider Note  ____________________________________________  Time seen: Approximately 8:41 PM  I have reviewed the triage vital signs and the nursing notes.   HISTORY  Chief Complaint Abdominal Pain (Pt. states diarhrea for the past 2 days.)   HPI Gwendolyn [name] is a 46 y.o. female with a history of colonic inertia status post total colectomy who presents for evaluation of crampy abdominal pain and diarrhea. Patient reports that she returned from [place] 2 days ago and since then has been having severe watery diarrhea almost one every hour. She has had nausea but no vomiting. Chills but no fever. Today she started having crampy abdominal pain that has been constant, sharp, 6 out of 10 at this time. Patient denies blood or melena. No chest pain or shortness of breath. Patient is status post total hysterectomy, appendectomy, colectomy, cholecystectomy. No prior history of C. difficile and no recent antibiotic use.  Past Medical History:  Diagnosis Date  . Depression     Patient Active Problem List   Diagnosis Date Noted  . Swimmer's ear of left side 07/30/2016  . Achilles tendinitis of both lower extremities 11/10/2015  . Plantar fasciitis 11/10/2015  . Overweight 07/23/2015  . Menopause 01/18/2015  . External hemorrhoid 10/06/2014  . Hypercholesteremia 10/05/2014  . Vitamin D deficiency 10/05/2014  . Depressive disorder 10/05/2014  . Personal history of breast cancer 10/05/2014  . Migraine without aura with status migrainosus 10/05/2014  . 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 Insomnia 10/05/2014  . Rectocele 10/05/2014  . Bipolar 2 disorder ([place]) 10/05/2014  . IBS (irritable bowel syndrome) 10/05/2014  . Obstructive sleep apnea 10/05/2014  . S/P colectomy 10/05/2014  . Hypersomnia 10/05/2014  . Anxiety 10/05/2014  . Fibromyalgia 10/05/2014  . Periodic limb movement disorder  10/05/2014    Past Surgical History:  Procedure Laterality Date  . ABDOMINAL HYSTERECTOMY  04/10/2011   But still has ovaries.  Marland Kitchen BREAST LUMPECTOMY Right 2003  . CHOLECYSTECTOMY  2000  . COLECTOMY  10/31/2012   SECONDARY TO COLONIC INERTIA    Prior to Admission medications   Medication Sig Start Date End Date Taking? Authorizing Provider  amphetamine-dextroamphetamine (ADDERALL XR) 20 MG 24 hr capsule Take by mouth.     [provider]  ARIPiprazole (ABILIFY) 5 MG tablet Take 5 mg by mouth daily. 06/15/15   [provider]  DULoxetine (CYMBALTA) 60 MG capsule Take 60 mg by mouth daily.    [provider]  gabapentin (NEURONTIN) 800 MG tablet TAKE 1 TABLET (800 MG TOTAL) BY MOUTH NIGHTLY. 08/29/16   [name], MD  Ginkgo Biloba 40 MG TABS Take by mouth.    [provider]  hydrocortisone (ANUSOL-HC) 25 MG suppository Place 1 suppository (25 mg total) rectally 2 (two) times daily. 08/29/16   [name], MD  ibuprofen (ADVIL,MOTRIN) 600 MG tablet Take 1 tablet (600 mg total) by mouth every 6 (six) hours as needed. 11/14/16   Rudene Re, MD  levofloxacin (LEVAQUIN) 500 MG tablet Take 1 tablet (500 mg total) by mouth daily. 08/29/16   [name], MD  Melatonin 3 MG TABS Take 1 tablet by mouth at bedtime.    [provider]  meloxicam (MOBIC) 15 MG tablet TAKE 1 TABLET (15 MG TOTAL) BY MOUTH DAILY. Patient not taking: Reported on 08/29/2016 03/09/16   Trula Slade, DPM  ondansetron (ZOFRAN) 4 MG tablet Take 1 tablet (4 mg total) by mouth every 8 (  eight) hours as needed for nausea or vomiting. 11/14/16   Alfred Levins, [place], MD  tiaGABine (GABITRIL) 4 MG tablet Take by mouth. Reported on 07/22/2015    [provider]  zolpidem (AMBIEN) 10 MG tablet Take 10 mg by mouth at bedtime as needed for sleep.    [provider]    Allergies Amoxicillin-pot clavulanate; Codeine; Morphine; and Penicillins  Family History    Problem Relation Age of Onset  . Meniere's disease Father   . Alcohol abuse Sister   . Heart disease Maternal Grandmother   . Heart disease Maternal Grandfather   . Heart disease Paternal Grandmother   . Heart disease Paternal Grandfather     Social History Social History  Substance Use Topics  . Smoking status: Former Smoker    Years: 20.00    Quit date: 04/23/2004  . Smokeless tobacco: Never Used     Comment: QUIT IN 2004  . Alcohol use 0.0 oz/week     Comment: OCCASIONALLY    Review of Systems  Constitutional: Negative for fever. + chills Eyes: Negative for visual changes. ENT: Negative for sore throat. Neck: No neck pain  Cardiovascular: Negative for chest pain. Respiratory: Negative for shortness of breath. Gastrointestinal: + cramping abdominal pain, nausea, and diarrhea. No vomiting  Genitourinary: Negative for dysuria. Musculoskeletal: Negative for back pain. Skin: Negative for rash. Neurological: Negative for headaches, weakness or numbness. Psych: No SI or HI  ____________________________________________   PHYSICAL EXAM:  VITAL SIGNS: ED Triage Vitals  Enc Vitals Group     BP 11/14/16 2009 124/78     Pulse Rate 11/14/16 2009 97     Resp 11/14/16 2009 18     Temp 11/14/16 2009 98.1 F (36.7 C)     Temp Source 11/14/16 2009 Oral     SpO2 11/14/16 2009 97 %     Weight 11/14/16 2010 180 lb (81.6 kg)     Height 11/14/16 2010 5\' 6"  (1.676 m)     Head Circumference --      Peak Flow --      Pain Score 11/14/16 2008 6     Pain Loc --      Pain Edu? --      Excl. in [place]? --     Constitutional: Alert and oriented. Well appearing and in no apparent distress. HEENT:      Head: Normocephalic and atraumatic.         Eyes: Conjunctivae are normal. Sclera is non-icteric.       Mouth/Throat: Mucous membranes are moist.       Neck: Supple with no signs of meningismus. Cardiovascular: Regular rate and rhythm. No murmurs, gallops, or rubs. 2+ symmetrical distal  pulses are present in all extremities. No JVD. Respiratory: Normal respiratory effort. Lungs are clear to auscultation bilaterally. No wheezes, crackles, or rhonchi.  Gastrointestinal: Soft, diffuse ttp, non distended with positive bowel sounds. No rebound or guarding. Genitourinary: No CVA tenderness. Musculoskeletal: Nontender with normal range of motion in all extremities. No edema, cyanosis, or erythema of extremities. Neurologic: Normal speech and language. Face is symmetric. Moving all extremities. No gross focal neurologic deficits are appreciated. Skin: Skin is warm, dry and intact. No rash noted. Psychiatric: Mood and affect are normal. Speech and behavior are normal.  ____________________________________________   LABS (all labs ordered are listed, but only abnormal results are displayed)  Labs Reviewed  COMPREHENSIVE METABOLIC PANEL - Abnormal; Notable for the following:       Result Value  Glucose, Bld 109 (*)    Alkaline Phosphatase 35 (*)    All other components within normal limits  CBC - Abnormal; Notable for the following:    WBC 14.2 (*)    All other components within normal limits  URINALYSIS, COMPLETE (UACMP) WITH MICROSCOPIC - Abnormal; Notable for the following:    Color, Urine YELLOW (*)    APPearance CLEAR (*)    Hgb urine dipstick LARGE (*)    Bacteria, UA RARE (*)    Squamous Epithelial / LPF 0-5 (*)    All other components within normal limits  C DIFFICILE QUICK SCREEN W PCR REFLEX  GASTROINTESTINAL PANEL BY PCR, STOOL (REPLACES STOOL CULTURE)  GIARDIA, EIA; OVA/PARASITE  URINE CULTURE  LIPASE, BLOOD  MAGNESIUM   ____________________________________________  EKG  none  ____________________________________________  RADIOLOGY  none  ____________________________________________   PROCEDURES  Procedure(s) performed: None Procedures Critical Care performed:  None ____________________________________________   INITIAL IMPRESSION /  ASSESSMENT AND PLAN / ED COURSE   46 y.o. female with a history of colonic inertia status post total colectomy who presents for evaluation of crampy abdominal pain and diarrhea, nausea, and chills for 2 days since returning from a trip to [place]. Patient is well appearing, looks well-hydrated, has normal vital signs, her abdomen shows diffuse tenderness to palpation with no rebound or guarding. We will get a stool sample for C. difficile, ova parasites, and stool panel. We'll give IV fluids and Zofran. We'll check electrolytes and kidney function.  Clinical Course as of Nov 14 2328  Wed Nov 14, 2016  2214 Patient's stool studies are pending. Patient is requesting discharge at this time. I will call her with her results tomorrow. Remaining of her blood work shows leukocytosis with white count of 14 consistent with a gastroenteritis picture. Her urine has blood in it but no evidence of infection. I talked to the patient that she could also have possibly a kidney stone on the right side since the pain seems to be more severe and colicky in nature on the right side. Patient does not have an appendix. At this time out of the patient will benefit from CT scan since her pain is extremely well controlled, her kidney function is normal, and there is no evidence of overlying infection in her urine. We'll discharge her home on supportive care, Zofran, and follow-up with primary care doctor.  [CV]    Clinical Course User Index [CV] Rudene Re, MD     Pertinent labs & imaging results that were available during my care of the patient were reviewed by me and considered in my medical decision making (see chart for details).    ____________________________________________   FINAL CLINICAL IMPRESSION(S) / ED DIAGNOSES  Final diagnoses:  Diarrhea of presumed infectious origin  Nausea  Generalized abdominal pain      NEW MEDICATIONS STARTED DURING THIS VISIT:  Discharge Medication List as of  11/14/2016 10:17 PM    START taking these medications   Details  ibuprofen (ADVIL,MOTRIN) 600 MG tablet Take 1 tablet (600 mg total) by mouth every 6 (six) hours as needed., Starting Wed 11/14/2016, Print    ondansetron (ZOFRAN) 4 MG tablet Take 1 tablet (4 mg total) by mouth every 8 (eight) hours as needed for nausea or vomiting., Starting Wed 11/14/2016, Print         Note:  This document was prepared using Dragon voice recognition software and may include unintentional dictation errors.    Alfred Levins, [place], MD 11/14/16 2330

## 2016-11-14 NOTE — Discharge Instructions (Signed)

## 2016-11-15 ENCOUNTER — Ambulatory Visit (INDEPENDENT_AMBULATORY_CARE_PROVIDER_SITE_OTHER)
Admit: 2016-11-15 | Discharge: 2016-11-15 | Disposition: A | Discharge status: Home / Self Care | Payer: BC Managed Care – PPO | Attending: Family Medicine | Admitting: Family Medicine

## 2016-11-15 ENCOUNTER — Telehealth: Payer: Self-pay | Admitting: Internal Medicine

## 2016-11-15 ENCOUNTER — Ambulatory Visit
Admission: EM | Admit: 2016-11-15 | Discharge: 2016-11-15 | Disposition: A | Discharge status: Home / Self Care | Payer: BC Managed Care – PPO | Attending: Family Medicine | Admitting: Family Medicine

## 2016-11-15 DIAGNOSIS — I880 Nonspecific mesenteric lymphadenitis: Secondary | ICD-10-CM | POA: Diagnosis not present

## 2016-11-15 DIAGNOSIS — R109 Unspecified abdominal pain: Secondary | ICD-10-CM

## 2016-11-15 DIAGNOSIS — K529 Noninfective gastroenteritis and colitis, unspecified: Secondary | ICD-10-CM

## 2016-11-15 MED ORDER — DIPHENOXYLATE-ATROPINE 2.5-0.025 MG PO TABS: 1.0000 | ORAL_TABLET | Freq: Three times a day (TID) | ORAL | 0 refills | Status: DC | PRN

## 2016-11-15 MED ORDER — KETOROLAC TROMETHAMINE 60 MG/2ML IM SOLN
60.0000 mg | Freq: Once | INTRAMUSCULAR | Status: AC
  Administered 2016-11-15: 60 mg via INTRAMUSCULAR

## 2016-11-15 MED ORDER — FREE WATER: 200.0000 mL | Freq: Once | Status: DC

## 2016-11-15 NOTE — ED Notes (Signed)
Pt was given water to drink per order.

## 2016-11-15 NOTE — ED Notes (Signed)
Prior Authorization # [id number]

## 2016-11-15 NOTE — Telephone Encounter (Signed)
Spoke with pt and she stated that was seen at the [facility] ED last night around 9:30pm for right sided abdominal pain and watery diarrhea times 3 days. The pt stated that the pain on the right side is localized tha has gotten worse since last night but that the diarrhea is starting to get better but is still black in color. Pt stated that while she was at the hospital they did absolutely no testing or scans, they just sent her home saying that it could be anything just follow up with your PCP. I advised the pt to go to [facility] since they have the means of doing xrays or scans if needed or the ED at [facility]. The pt gave a verbal understanding and stated that she would go to [facility].

## 2016-11-15 NOTE — Telephone Encounter (Signed)
Pt called about being seen at the ER for kidney stone , stomach bug or parasite per pt. Pt has had watery diarrhea for 3 days now. Pt would like to see Dr Derrel Nip only. No appt avail. Pt is in a lot of pain. Please advise?  Call pt @ [phone]. Thank you!

## 2016-11-15 NOTE — ED Triage Notes (Signed)
Patient complains of diarrhea after returning from [place] that started on Monday pm. Patient states that she has had a colectomy. Patient states that she was seen in ED last night and was told that it could be a number of things but that she could return home. Patient complains of localized pain on her right lower quadrant. Patient states that she was told by her Primary care provider to come here.

## 2016-11-15 NOTE — ED Provider Notes (Signed)
[facility]    CSN: [id number] Arrival date & time: 11/15/16  1102     History   Chief Complaint Chief Complaint  Patient presents with  . Diarrhea    HPI Gwendolyn [name] is a 46 y.o. female.    Patient's 46 year old white female who comes in complaining of abdominal pain. She was seen in the ED last night stool was negative for pathogens she was told she probably had a viral gastroenteritis and follow-up for PCP. She contacted PCP who informed her that there is nothing they could do for recommend they go to the urgent care because they always have a CT scan available which we do not. She did have a CT scan of abdomen without abdominal pain she is having. Past medical history she's had a hysterectomy before but she does have her ovaries she's had a colostomy with reversal she thinks she has had had an appendectomy but she states she's never been told for sure that they did remove her appendix. She's had a cholecystectomy before breast lumpectomy also. The diarrhea actually has gotten a little bit better but the abdominal pain is Worse. No pertinent family medical history relevant to today's visit. She's had a history of depression before. Strong family history heart disease present family and sister with alcohol abuse problems. She is a former smoker. She is allergic to Augmentin, amoxicillin, codeine morphine and penicillins she is a former smoker. She states the pain is staying and 10 out 10   The history is provided by the patient. No language interpreter was used.  Diarrhea  Quality:  Explosive, copious and watery Severity:  Severe Onset quality:  Sudden Associated symptoms: abdominal pain     Past Medical History:  Diagnosis Date  . Depression     Patient Active Problem List   Diagnosis Date Noted  . Swimmer's ear of left side 07/30/2016  . Achilles tendinitis of both lower extremities 11/10/2015  . Plantar fasciitis 11/10/2015  . Overweight 07/23/2015    . Menopause 01/18/2015  . External hemorrhoid 10/06/2014  . Hypercholesteremia 10/05/2014  . Vitamin D deficiency 10/05/2014  . Depressive disorder 10/05/2014  . Personal history of breast cancer 10/05/2014  . Migraine without aura with status migrainosus 10/05/2014  . 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 Insomnia 10/05/2014  . Rectocele 10/05/2014  . Bipolar 2 disorder ([place]) 10/05/2014  . IBS (irritable bowel syndrome) 10/05/2014  . Obstructive sleep apnea 10/05/2014  . S/P colectomy 10/05/2014  . Hypersomnia 10/05/2014  . Anxiety 10/05/2014  . Fibromyalgia 10/05/2014  . Periodic limb movement disorder 10/05/2014    Past Surgical History:  Procedure Laterality Date  . ABDOMINAL HYSTERECTOMY  04/10/2011   But still has ovaries.  Marland Kitchen BREAST LUMPECTOMY Right 2003  . CHOLECYSTECTOMY  2000  . COLECTOMY  10/31/2012   SECONDARY TO COLONIC INERTIA    OB History    No data available       Home Medications    Prior to Admission medications   Medication Sig Start Date End Date Taking? Authorizing Provider  amphetamine-dextroamphetamine (ADDERALL XR) 20 MG 24 hr capsule Take by mouth.    Yes [provider]  ARIPiprazole (ABILIFY) 5 MG tablet Take 5 mg by mouth daily. 06/15/15  Yes [provider]  DULoxetine (CYMBALTA) 60 MG capsule Take 60 mg by mouth daily.   Yes [provider]  gabapentin (NEURONTIN) 800 MG tablet TAKE 1 TABLET (800 MG TOTAL) BY MOUTH NIGHTLY. 08/29/16  Yes  [name], MD  Ginkgo Biloba 40 MG TABS Take by mouth.   Yes [provider]  hydrocortisone (ANUSOL-HC) 25 MG suppository Place 1 suppository (25 mg total) rectally 2 (two) times daily. 08/29/16  Yes [name], MD  ibuprofen (ADVIL,MOTRIN) 600 MG tablet Take 1 tablet (600 mg total) by mouth every 6 (six) hours as needed. 11/14/16  Yes Alfred Levins, [place], MD  levofloxacin (LEVAQUIN) 500 MG tablet Take 1 tablet (500 mg total) by mouth  daily. 08/29/16  Yes [name], MD  Melatonin 3 MG TABS Take 1 tablet by mouth at bedtime.   Yes [provider]  meloxicam (MOBIC) 15 MG tablet TAKE 1 TABLET (15 MG TOTAL) BY MOUTH DAILY. 03/09/16  Yes Trula Slade, DPM  ondansetron (ZOFRAN) 4 MG tablet Take 1 tablet (4 mg total) by mouth every 8 (eight) hours as needed for nausea or vomiting. 11/14/16  Yes Alfred Levins, [place], MD  tiaGABine (GABITRIL) 4 MG tablet Take by mouth. Reported on 07/22/2015   Yes [provider]  zolpidem (AMBIEN) 10 MG tablet Take 10 mg by mouth at bedtime as needed for sleep.   Yes [provider]  diphenoxylate-atropine (LOMOTIL) 2.5-0.025 MG tablet Take 1 tablet by mouth 3 (three) times daily as needed for diarrhea or loose stools. 11/15/16   Frederich Cha, MD    Family History Family History  Problem Relation Age of Onset  . Meniere's disease Father   . Alcohol abuse Sister   . Heart disease Maternal Grandmother   . Heart disease Maternal Grandfather   . Heart disease Paternal Grandmother   . Heart disease Paternal Grandfather     Social History Social History  Substance Use Topics  . Smoking status: Former Smoker    Years: 20.00    Quit date: 04/23/2004  . Smokeless tobacco: Never Used     Comment: QUIT IN 2004  . Alcohol use 0.0 oz/week     Comment: OCCASIONALLY     Allergies   Amoxicillin-pot clavulanate; Codeine; Morphine; and Penicillins   Review of Systems Review of Systems  Gastrointestinal: Positive for abdominal pain and diarrhea.  All other systems reviewed and are negative.    Physical Exam Triage Vital Signs ED Triage Vitals  Enc Vitals Group     BP 11/15/16 1154 (!) 104/55     Pulse Rate 11/15/16 1154 87     Resp 11/15/16 1154 18     Temp 11/15/16 1154 98 F (36.7 C)     Temp Source 11/15/16 1154 Oral     SpO2 11/15/16 1154 100 %     Weight 11/15/16 1155 180 lb (81.6 kg)     Height 11/15/16 1155 5\' 6"  (1.676 m)     Head Circumference  --      Peak Flow --      Pain Score 11/15/16 1155 7     Pain Loc --      Pain Edu? --      Excl. in [place]? --    No data found.   Updated Vital Signs BP (!) 104/55 (BP Location: Right Arm)   Pulse 87   Temp 98 F (36.7 C) (Oral)   Resp 18   Ht 5\' 6"  (1.676 m)   Wt 180 lb (81.6 kg)   SpO2 100%   BMI 29.05 kg/m   Visual Acuity Right Eye Distance:   Left Eye Distance:   Bilateral Distance:    Right Eye Near:   Left Eye  Near:    Bilateral Near:     Physical Exam  Constitutional: She is oriented to person, place, and time. She appears well-developed and well-nourished.  HENT:  Head: Normocephalic and atraumatic.  Right Ear: External ear normal.  Left Ear: External ear normal.  Eyes: Pupils are equal, round, and reactive to light. EOM are normal.  Neck: Normal range of motion. Neck supple.  Cardiovascular: Normal rate.   Pulmonary/Chest: Effort normal and breath sounds normal.  Abdominal: Soft. Bowel sounds are normal. There is no hepatosplenomegaly. There is tenderness in the right lower quadrant. There is CVA tenderness. No hernia.    Musculoskeletal: Normal range of motion.  Neurological: She is alert and oriented to person, place, and time. No cranial nerve deficit.  Skin: Skin is warm.  Psychiatric: Her mood appears anxious.  Vitals reviewed.    UC Treatments / Results  Labs (all labs ordered are listed, but only abnormal results are displayed) Labs Reviewed - No data to display  EKG  EKG Interpretation None       Radiology Ct Renal Stone Study  Result Date: 11/15/2016 CLINICAL DATA:  46 year old female with history of right-sided flank at end right mid abdominal pain for the past several days. Microscopic hematuria. Black stool. Recent history to [place] with travelers diarrhea. EXAM: CT ABDOMEN AND PELVIS WITHOUT CONTRAST TECHNIQUE: Multidetector CT imaging of the abdomen and pelvis was performed following the standard protocol without IV contrast.  COMPARISON:  No priors. FINDINGS: Lower chest: Unremarkable. Hepatobiliary: No definite cystic or solid hepatic lesions are identified on today's noncontrast CT examination. Status post cholecystectomy. Pancreas: No definite pancreatic mass or peripancreatic inflammatory changes are noted on today's noncontrast CT examination. Spleen: Unremarkable. Adrenals/Urinary Tract: No calcifications are identified within the collecting system of either kidney, along the course of either ureter, or within the lumen of the urinary bladder. There is no hydroureteronephrosis or perinephric stranding to indicate urinary tract obstruction at this time. Urinary bladder is normal in appearance. Stomach/Bowel: The unenhanced appearance of the stomach is normal. Status post subtotal colectomy. There is what appears to be an ileocolic anastomosis in the central pelvis. Proximal to this the loops of small bowel contain some fecalized contents. There is some thickening and distention of one of the loops of small bowel which appears to have a suture line associated with it in the right side of the abdomen, best appreciated on axial image 43 of series 2. Given the distention of this loop of bowel, the bowel wall appears thickened. No other dilated loops of more proximal small bowel are otherwise noted, and the focal dilatation in this region likely reflects some mild postoperative denervation given the presence of the suture line. There is some slight haziness in the adjacent small bowel mesenteries and multiple prominent borderline enlarged and mildly enlarged lymph nodes which measure up to 1 cm in short axis. Vascular/Lymphatic: No atherosclerotic calcifications or definite aneurysm identified in the visualized abdominal or pelvic vasculature. Multiple borderline enlarged and mildly enlarged mesenteric lymph nodes, as discussed above. No other lymphadenopathy identified. Reproductive: Status post hysterectomy. Ovaries are not confidently  identified may be surgically absent or atrophic. Other: No significant volume of ascites.  No pneumoperitoneum. Musculoskeletal: There are no aggressive appearing lytic or blastic lesions noted in the visualized portions of the skeleton. IMPRESSION: 1. Postoperative changes of subtotal colectomy with some mild dilatation of the distal ileum and some mild bowel wall thickening in this area with subtle surrounding inflammatory changes and multiple borderline  enlarged and minimally enlarged mesenteric lymph nodes. Overall, the findings are favored to reflect a mild enteritis. 2. Additional incidental findings, as above. Electronically Signed   By: Vinnie Langton M.D.   On: 11/15/2016 13:49    Procedures Procedures (including critical care time)  Medications Ordered in UC Medications  free water 200 mL (not administered)  ketorolac (TORADOL) injection 60 mg (60 mg Intramuscular Given 11/15/16 1339)     Initial Impression / Assessment and Plan / UC Course  I have reviewed the triage vital signs and the nursing notes.  Pertinent labs & imaging results that were available during my care of the patient were reviewed by me and considered in my medical decision making (see chart for details).    's since she and her PCP or crackles concerned that CT scans as needed this one was ordered. Scan was negative but does show enteritis present assessment enteritis plan P will give her Lomotil for diarrhea 1 tablet 3 times a day limited number we curetted follow-up as needed with PCP in 2-4 days.  Final Clinical Impressions(s) / UC Diagnoses   Final diagnoses:  Gastroenteritis  Mesenteric adenitis  Enteritis    New Prescriptions New Prescriptions   DIPHENOXYLATE-ATROPINE (LOMOTIL) 2.5-0.025 MG TABLET    Take 1 tablet by mouth 3 (three) times daily as needed for diarrhea or loose stools.     Patient informed she has enteritis and will give his Lomotil to use one every 8 hours and 3 times a day  follow-up with PCP as needed. She declined work note. Lab work CBC UA and BMP was reviewed from last night not repeated again today.  Note: This dictation was prepared with Dragon dictation along with smaller phrase technology. Any transcriptional errors that result from this process are unintentional.       Frederich Cha, MD 11/15/16 1418

## 2016-11-17 LAB — URINE CULTURE

## 2016-11-18 ENCOUNTER — Telehealth: Payer: Self-pay

## 2016-11-18 NOTE — Progress Notes (Signed)
ED Antimicrobial Stewardship Positive Culture Follow Up   Gwendolyn [name] is an 46 y.o. female who presented to [facility] on 11/15/2016 with a chief complaint of crampy abdominal pain and watery diarrhea   Recent Results (from the past 720 hour(s))  C difficile quick scan w PCR reflex     Status: None   Collection Time: 11/14/16  9:10 PM  Result Value Ref Range Status   C Diff antigen NEGATIVE NEGATIVE Final   C Diff toxin NEGATIVE NEGATIVE Final   C Diff interpretation No C. difficile detected.  Final  Gastrointestinal Panel by PCR , Stool     Status: None   Collection Time: 11/14/16  9:10 PM  Result Value Ref Range Status   Campylobacter species NOT DETECTED NOT DETECTED Final   Plesimonas shigelloides NOT DETECTED NOT DETECTED Final   Salmonella species NOT DETECTED NOT DETECTED Final   Yersinia enterocolitica NOT DETECTED NOT DETECTED Final   Vibrio species NOT DETECTED NOT DETECTED Final   Vibrio cholerae NOT DETECTED NOT DETECTED Final   Enteroaggregative E coli (EAEC) NOT DETECTED NOT DETECTED Final   Enteropathogenic E coli (EPEC) NOT DETECTED NOT DETECTED Final   Enterotoxigenic E coli (ETEC) NOT DETECTED NOT DETECTED Final   Shiga like toxin producing E coli (STEC) NOT DETECTED NOT DETECTED Final   Shigella/Enteroinvasive E coli (EIEC) NOT DETECTED NOT DETECTED Final   Cryptosporidium NOT DETECTED NOT DETECTED Final   Cyclospora cayetanensis NOT DETECTED NOT DETECTED Final   Entamoeba histolytica NOT DETECTED NOT DETECTED Final   Giardia lamblia NOT DETECTED NOT DETECTED Final   Adenovirus F40/41 NOT DETECTED NOT DETECTED Final   Astrovirus NOT DETECTED NOT DETECTED Final   Norovirus GI/GII NOT DETECTED NOT DETECTED Final   Rotavirus A NOT DETECTED NOT DETECTED Final   Sapovirus (I, II, IV, and V) NOT DETECTED NOT DETECTED Final  Giardia, EIA; Ova/Parasite     Status: None   Collection Time: 11/14/16  9:10 PM  Result Value Ref Range Status   Ova + Parasite Exam  PENDING  Incomplete   Giardia Ag, Stl Negative Negative Final    Comment: (NOTE) Performed At: [facility] [place], [place] [id number] Lindon Romp MD FG:[id number]    Source of Sample STOOL  Corrected  Urine Culture     Status: Abnormal   Collection Time: 11/14/16  9:28 PM  Result Value Ref Range Status   Specimen Description URINE, RANDOM  Final   Special Requests NONE  Final   Culture >=100,000 COLONIES/mL ESCHERICHIA COLI (A)  Final   Report Status 11/17/2016 FINAL  Final   Organism ID, Bacteria ESCHERICHIA COLI (A)  Final      Susceptibility   Escherichia coli - MIC*    AMPICILLIN >=32 RESISTANT Resistant     CEFAZOLIN <=4 SENSITIVE Sensitive     CEFTRIAXONE <=1 SENSITIVE Sensitive     CIPROFLOXACIN <=0.25 SENSITIVE Sensitive     GENTAMICIN <=1 SENSITIVE Sensitive     IMIPENEM <=0.25 SENSITIVE Sensitive     NITROFURANTOIN <=16 SENSITIVE Sensitive     TRIMETH/SULFA >=320 RESISTANT Resistant     AMPICILLIN/SULBACTAM 16 INTERMEDIATE Intermediate     PIP/TAZO <=4 SENSITIVE Sensitive     Extended ESBL NEGATIVE Sensitive     * >=100,000 COLONIES/mL ESCHERICHIA COLI    Patient was discharged without any antibiotics. Urine cx grew out > 100K E. Coli but this could be asymptomatic bacteriuria given her symptoms of diarrhea. Will ask patient placement to inquire  if patient has any symptoms of UTI. If no symptoms are present, no antibiotic therapy warranted. If symptoms present, complete cephalexin 500 mg twice daily x 5 days.  ED Provider: Vena Rua, PharmD., BCPS Clinical Pharmacist Phone [phone]

## 2016-11-18 NOTE — Telephone Encounter (Signed)
Post ED Visit - Positive Culture Follow-up: Unsuccessful Patient Follow-up  Culture assessed and recommendations reviewed by:  []  Elenor Quinones, Pharm.D. []  Heide Guile, Pharm.D., BCPS AQ-ID []  Parks Neptune, Pharm.D., BCPS []  Alycia Rossetti, Pharm.D., BCPS []  [place], [place].D., BCPS, AAHIVP []  Legrand Como, Pharm.D., BCPS, AAHIVP []  Salome Arnt, PharmD, BCPS []  Dimitri Ped, PharmD, BCPS []  Vincenza Hews, PharmD, BCPS [facility] Pharm D Positive urine culture Needs symptom check may need abx [x]  Patient discharged without antimicrobial prescription and treatment is now indicated []  Organism is resistant to prescribed ED discharge antimicrobial []  Patient with positive blood cultures   Unable to contact patient after 3 attempts, letter will be sent to address on file  Genia Del 11/18/2016, 11:56 AM

## 2016-11-21 ENCOUNTER — Ambulatory Visit (INDEPENDENT_AMBULATORY_CARE_PROVIDER_SITE_OTHER): Payer: BC Managed Care – PPO | Admitting: Internal Medicine

## 2016-11-21 ENCOUNTER — Other Ambulatory Visit (HOSPITAL_COMMUNITY)
Admission: RE | Admit: 2016-11-21 | Discharge: 2016-11-21 | Disposition: A | Discharge status: Home / Self Care | Payer: BC Managed Care – PPO | Source: Ambulatory Visit | Attending: Internal Medicine | Admitting: Internal Medicine

## 2016-11-21 ENCOUNTER — Encounter: Payer: Self-pay | Admitting: Internal Medicine

## 2016-11-21 VITALS — BP 98/70 | HR 100 | Temp 98.2°F | Resp 15 | Ht 66.0 in | Wt 180.6 lb

## 2016-11-21 DIAGNOSIS — Z124 Encounter for screening for malignant neoplasm of cervix: Secondary | ICD-10-CM | POA: Diagnosis present

## 2016-11-21 DIAGNOSIS — B9620 Unspecified Escherichia coli [E. coli] as the cause of diseases classified elsewhere: Secondary | ICD-10-CM | POA: Diagnosis not present

## 2016-11-21 DIAGNOSIS — Z9049 Acquired absence of other specified parts of digestive tract: Secondary | ICD-10-CM | POA: Diagnosis not present

## 2016-11-21 DIAGNOSIS — N390 Urinary tract infection, site not specified: Secondary | ICD-10-CM | POA: Diagnosis not present

## 2016-11-21 DIAGNOSIS — Z90711 Acquired absence of uterus with remaining cervical stump: Secondary | ICD-10-CM | POA: Diagnosis not present

## 2016-11-21 DIAGNOSIS — A09 Infectious gastroenteritis and colitis, unspecified: Secondary | ICD-10-CM | POA: Diagnosis not present

## 2016-11-21 DIAGNOSIS — Z0000 Encounter for general adult medical examination without abnormal findings: Secondary | ICD-10-CM

## 2016-11-21 DIAGNOSIS — D72829 Elevated white blood cell count, unspecified: Secondary | ICD-10-CM | POA: Diagnosis not present

## 2016-11-21 DIAGNOSIS — Z853 Personal history of malignant neoplasm of breast: Secondary | ICD-10-CM

## 2016-11-21 DIAGNOSIS — E663 Overweight: Secondary | ICD-10-CM

## 2016-11-21 DIAGNOSIS — Z0001 Encounter for general adult medical examination with abnormal findings: Secondary | ICD-10-CM

## 2016-11-21 LAB — CBC WITH DIFFERENTIAL/PLATELET
BASOS PCT: 1.1 % (ref 0.0–3.0)
Basophils Absolute: 0.1 10*3/uL (ref 0.0–0.1)
EOS PCT: 1.7 % (ref 0.0–5.0)
Eosinophils Absolute: 0.1 10*3/uL (ref 0.0–0.7)
HCT: 40.1 % (ref 36.0–46.0)
Hemoglobin: 13.5 g/dL (ref 12.0–15.0)
LYMPHS ABS: 1.9 10*3/uL (ref 0.7–4.0)
Lymphocytes Relative: 27.1 % (ref 12.0–46.0)
MCHC: 33.7 g/dL (ref 30.0–36.0)
MCV: 91.4 fl (ref 78.0–100.0)
MONO ABS: 0.5 10*3/uL (ref 0.1–1.0)
Monocytes Relative: 7.7 % (ref 3.0–12.0)
NEUTROS ABS: 4.4 10*3/uL (ref 1.4–7.7)
NEUTROS PCT: 62.4 % (ref 43.0–77.0)
RBC: 4.39 Mil/uL (ref 3.87–5.11)
RDW: 13.5 % (ref 11.5–15.5)
WBC: 7.1 10*3/uL (ref 4.0–10.5)

## 2016-11-21 MED ORDER — CIPROFLOXACIN HCL 250 MG PO TABS: 250.0000 mg | ORAL_TABLET | Freq: Two times a day (BID) | ORAL | 0 refills | Status: DC

## 2016-11-21 NOTE — Patient Instructions (Signed)
You have a UTI.  I am treating you with Cipro for 5 days

## 2016-11-21 NOTE — Progress Notes (Signed)
Patient ID: Gwendolyn [name], female    DOB: 1970/08/22  Age: 46 y.o. MRN: [id number]  The patient is here for annual  examination and management of other chronic and acute problems.  .    The risk factors are reflected in the social history.  The roster of all physicians providing medical care to patient - is listed in the Snapshot section of the chart.  Activities of daily living:  The patient is 100% independent in all ADLs: dressing, toileting, feeding as well as independent mobility  Home safety : The patient has smoke detectors in the home. They wear seatbelts.  There are no firearms at home. There is no violence in the home.   There is no risks for hepatitis, STDs or HIV. There is no   history of blood transfusion. They have no travel history to infectious disease endemic areas of the world.  The patient has seen their dentist in the last six month. They have seen their eye doctor in the last year.    They do not  have excessive sun exposure. Discussed the need for sun protection: hats, long sleeves and use of sunscreen if there is significant sun exposure.   Diet: the importance of a healthy diet is discussed. They do have a healthy diet.  The benefits of regular aerobic exercise were discussed. She has stopped exercising and is concerned about her weight.    Depression screen: there are no signs or vegative symptoms of untreatedn depression- irritability, change in appetite, anhedonia, sadness/tearfullness.   The following portions of the patient's history were reviewed and updated as appropriate: allergies, current medications, past family history, past medical history,  past surgical history, past social history  and problem list.  Visual acuity was not assessed per patient preference since she has regular follow up with her ophthalmologist. Hearing and body mass index were assessed and reviewed.   During the course of the visit the patient was educated and counseled  about appropriate screening and preventive services including : fall prevention , diabetes screening, nutrition counseling, colorectal cancer screening, and recommended immunizations.    CC: The primary encounter diagnosis was Cervical cancer screening. Diagnoses of Leukocytosis, unspecified type, S/P colectomy, Encounter for preventive health examination, Traveler's diarrhea, E-coli UTI, History of breast cancer, and Overweight were also pertinent to this visit.  Returned from [place] on Monday night, developed profuse watery  diarrhea with cramping on  Monday night. Went to ER on Wednesday .  Sent home with diagnosis of undetermined etiology ,  Stool and urine samples were collected. Heme negative.   Symptoms developed into right sided flank pain with microscopic hematuria noted and she was directed to return to ER.  Went to [facility]   CT scan done,  Mild  enteritis noted,  No renal stones .   Lomotil given .   Results of Urine Culture and GI Pathogen panel were discussed with patient today.  Patient had not received any calls from the ER .  UTI  positive for E Coli resistant to sulfa .  GI pathogen panel  And lipase were both normal/ negative.  She has not been prescribed antibiotics by ED bc they have been unable to contact her.   She feels better ; stools have returned to normal   History Linde has a past medical history of Depression.   She has a past surgical history that includes Cholecystectomy (2000); Breast lumpectomy (Right, 2003); Colectomy (10/31/2012); and Abdominal hysterectomy (04/10/2011).   Her  family history includes Alcohol abuse in her sister; Heart disease in her maternal grandfather, maternal grandmother, paternal grandfather, and paternal grandmother; Meniere's disease in her father.She reports that she quit smoking about 12 years ago. She quit after 20.00 years of use. She has never used smokeless tobacco. She reports that she drinks alcohol. She reports that she  does not use drugs.  Outpatient Medications Prior to Visit  Medication Sig Dispense Refill  . diphenoxylate-atropine (LOMOTIL) 2.5-0.025 MG tablet Take 1 tablet by mouth 3 (three) times daily as needed for diarrhea or loose stools. 30 tablet 0  . DULoxetine (CYMBALTA) 60 MG capsule Take 60 mg by mouth daily.    Marland Kitchen gabapentin (NEURONTIN) 800 MG tablet TAKE 1 TABLET (800 MG TOTAL) BY MOUTH NIGHTLY. 90 tablet 3  . Ginkgo Biloba 40 MG TABS Take by mouth.    . hydrocortisone (ANUSOL-HC) 25 MG suppository Place 1 suppository (25 mg total) rectally 2 (two) times daily. 12 suppository 11  . ondansetron (ZOFRAN) 4 MG tablet Take 1 tablet (4 mg total) by mouth every 8 (eight) hours as needed for nausea or vomiting. 20 tablet 0  . tiaGABine (GABITRIL) 4 MG tablet Take by mouth. Reported on 07/22/2015    . zolpidem (AMBIEN) 10 MG tablet Take 10 mg by mouth at bedtime as needed for sleep.    . Melatonin 3 MG TABS Take 1 tablet by mouth at bedtime.    Marland Kitchen amphetamine-dextroamphetamine (ADDERALL XR) 20 MG 24 hr capsule Take by mouth.     . ARIPiprazole (ABILIFY) 5 MG tablet Take 5 mg by mouth daily.  1  . ibuprofen (ADVIL,MOTRIN) 600 MG tablet Take 1 tablet (600 mg total) by mouth every 6 (six) hours as needed. (Patient not taking: Reported on 11/21/2016) 20 tablet 0  . levofloxacin (LEVAQUIN) 500 MG tablet Take 1 tablet (500 mg total) by mouth daily. (Patient not taking: Reported on 11/21/2016) 7 tablet 0  . meloxicam (MOBIC) 15 MG tablet TAKE 1 TABLET (15 MG TOTAL) BY MOUTH DAILY. (Patient not taking: Reported on 11/21/2016) 30 tablet 0   No facility-administered medications prior to visit.     Review of Systems   Patient denies headache, fevers,  unintentional weight loss, skin rash, eye pain, sinus congestion and sinus pain, sore throat, dysphagia,  hemoptysis , cough, dyspnea, wheezing, chest pain, palpitations, orthopnea, edema, abdominal pain, nausea, melena, diarrhea, constipation, flank pain, dysuria,  hematuria, urinary  Frequency, nocturia, numbness, tingling, seizures,  Focal weakness, Loss of consciousness,  Tremor, insomnia, depression, anxiety, and suicidal ideation.      Objective:   BP 98/70 (BP Location: Left Arm, Patient Position: Sitting, Cuff Size: Large)   Pulse 100   Temp 98.2 F (36.8 C) (Oral)   Resp 15   Ht 5\' 6"  (1.676 m)   Wt 180 lb 9.6 oz (81.9 kg)   SpO2 97%   BMI 29.15 kg/m    Physical Exam  General Appearance:    Alert, cooperative, no distress, appears stated age  Head:    Normocephalic, without obvious abnormality, atraumatic  Eyes:    PERRL, conjunctiva/corneas clear, EOM's intact, fundi    benign, both eyes  Ears:    Normal TM's and external ear canals, both ears  Nose:   Nares normal, septum midline, mucosa normal, no drainage    or sinus tenderness  Throat:   Lips, mucosa, and tongue normal; teeth and gums normal  Neck:   Supple, symmetrical, trachea midline, no adenopathy;    thyroid:  no enlargement/tenderness/nodules; no carotid   bruit or JVD  Back:     Symmetric, no curvature, ROM normal, no CVA tenderness  Lungs:     Clear to auscultation bilaterally, respirations unlabored  Chest Wall:    No tenderness or deformity   Heart:    Regular rate and rhythm, S1 and S2 normal, no murmur, rub   or gallop  Breast Exam:    No tenderness, masses, or nipple abnormality  Abdomen:     Soft, non-tender, bowel sounds active all four quadrants,    no masses, no organomegaly  Genitalia:    Pelvic: cervix surgically absent, external genitalia normal, no adnexal masses or tenderness,  rectovaginal septum normal, uterus surgically  absent and vagina normal without discharge  Extremities:   Extremities normal, atraumatic, no cyanosis or edema  Pulses:   2+ and symmetric all extremities  Skin:   Skin color, texture, turgor normal, no rashes or lesions  Lymph nodes:   Cervical, supraclavicular, and axillary nodes normal  Neurologic:   CNII-XII intact, normal  strength, sensation and reflexes    throughout     Assessment & Plan:   Problem List Items Addressed This Visit    Traveler's diarrhea    Resolved without use of antibiotics.  However she now has a E Coli UTI .  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        History of breast cancer    S/p right lumpectomy.  [facility] mammogram 2016.  Ordered . Exam normal except for scar tissue on right       Encounter for preventive health examination    Annual comprehensive preventive exam was done as well as an evaluation and management of chronic conditions .  During the course of the visit the patient was educated and counseled about appropriate screening and preventive services including :  diabetes screening, lipid analysis with projected  10 year  risk for CAD , nutrition counseling, breast, cervical and colorectal cancer screening, and recommended immunizations.  Printed recommendations for health maintenance screenings was given      E-coli UTI    Sensitive to Cipro .  250 mg bid x 5 days . Continue probiotic daily        Other Visit Diagnoses    Cervical cancer screening    -  Primary   Relevant Orders   Cytology - PAP   Leukocytosis, unspecified type       Relevant Orders   CBC with Differential/Platelet (Completed)      I have discontinued Ms. Nilsson's ARIPiprazole, meloxicam, levofloxacin, and ibuprofen. I am also having her start on ciprofloxacin. Additionally, I am having her maintain her zolpidem, tiaGABine, Melatonin, DULoxetine, Ginkgo Biloba, gabapentin, hydrocortisone, ondansetron, diphenoxylate-atropine, and amphetamine-dextroamphetamine.  Meds ordered this encounter  Medications  . amphetamine-dextroamphetamine  (ADDERALL) 30 MG tablet    Sig: Take 30 mg by mouth 2 (two) times daily.     Refill:  0  . ciprofloxacin (CIPRO) 250 MG tablet    Sig: Take 1 tablet (250 mg total) by mouth 2 (two) times daily.    Dispense:  10 tablet    Refill:  0  Medications Discontinued During This Encounter  Medication Reason  . amphetamine-dextroamphetamine (ADDERALL XR) 20 MG 24 hr capsule Patient has not taken in last 30 days  . ARIPiprazole (ABILIFY) 5 MG tablet Patient has not taken in last 30 days  . ibuprofen (ADVIL,MOTRIN) 600 MG tablet Patient has not taken in last 30 days  . meloxicam (MOBIC) 15 MG tablet Patient has not taken in last 30 days  . levofloxacin (LEVAQUIN) 500 MG tablet Patient has not taken in last 30 days    Follow-up: No Follow-up on file.   [name], MD

## 2016-11-22 ENCOUNTER — Encounter: Payer: Self-pay | Admitting: Internal Medicine

## 2016-11-22 DIAGNOSIS — B9620 Unspecified Escherichia coli [E. coli] as the cause of diseases classified elsewhere: Secondary | ICD-10-CM | POA: Insufficient documentation

## 2016-11-22 DIAGNOSIS — Z90711 Acquired absence of uterus with remaining cervical stump: Secondary | ICD-10-CM | POA: Insufficient documentation

## 2016-11-22 DIAGNOSIS — Z0000 Encounter for general adult medical examination without abnormal findings: Secondary | ICD-10-CM | POA: Insufficient documentation

## 2016-11-22 DIAGNOSIS — A09 Infectious gastroenteritis and colitis, unspecified: Secondary | ICD-10-CM | POA: Insufficient documentation

## 2016-11-22 DIAGNOSIS — N390 Urinary tract infection, site not specified: Secondary | ICD-10-CM

## 2016-11-22 NOTE — Assessment & Plan Note (Signed)
I have addressed  BMI and recommended wt loss of 10% of body weigh over the next 6 months using a low glycemic index diet and regular exercise a minimum of 5 days per week.   

## 2016-11-22 NOTE — Assessment & Plan Note (Signed)
Annual comprehensive preventive exam was done as well as an evaluation and management of chronic conditions .  During the course of the visit the patient was educated and counseled about appropriate screening and preventive services including :  diabetes screening, lipid analysis with projected  10 year  risk for CAD , nutrition counseling, breast, cervical and colorectal cancer screening, and recommended immunizations.  Printed recommendations for health maintenance screenings was given 

## 2016-11-22 NOTE — Assessment & Plan Note (Signed)
PAP smear done today  

## 2016-11-22 NOTE — Assessment & Plan Note (Signed)
Secondary to colonic inertia, With ileocolonic anastomosis remotely

## 2016-11-22 NOTE — Assessment & Plan Note (Signed)
Sensitive to Cipro .  250 mg bid x 5 days . Continue probiotic daily

## 2016-11-22 NOTE — Assessment & Plan Note (Signed)
S/p right lumpectomy.  Las mammogram 2016.  Ordered . Exam normal except for scar tissue on right

## 2016-11-22 NOTE — Assessment & Plan Note (Signed)
Resolved without use of antibiotics.  However she now has a E Coli UTI .  cipro rx'd

## 2016-11-23 LAB — CYTOLOGY - PAP
Diagnosis: NEGATIVE
HPV: NOT DETECTED

## 2016-11-24 ENCOUNTER — Encounter: Payer: Self-pay | Admitting: Internal Medicine

## 2016-11-26 ENCOUNTER — Ambulatory Visit
Admission: RE | Admit: 2016-11-26 | Discharge: 2016-11-26 | Disposition: A | Discharge status: Home / Self Care | Payer: BC Managed Care – PPO | Source: Ambulatory Visit | Attending: Internal Medicine | Admitting: Internal Medicine

## 2016-11-26 DIAGNOSIS — Z1231 Encounter for screening mammogram for malignant neoplasm of breast: Secondary | ICD-10-CM | POA: Insufficient documentation

## 2016-11-26 DIAGNOSIS — Z1239 Encounter for other screening for malignant neoplasm of breast: Secondary | ICD-10-CM

## 2016-11-26 LAB — GIARDIA, EIA; OVA/PARASITE: GIARDIA AG STL: NEGATIVE

## 2016-11-26 LAB — O&P RESULT

## 2016-11-27 ENCOUNTER — Encounter: Payer: Self-pay | Admitting: Internal Medicine

## 2017-01-29 ENCOUNTER — Encounter: Payer: Self-pay | Admitting: Internal Medicine

## 2017-01-29 ENCOUNTER — Ambulatory Visit (INDEPENDENT_AMBULATORY_CARE_PROVIDER_SITE_OTHER): Payer: BC Managed Care – PPO | Admitting: Internal Medicine

## 2017-01-29 VITALS — BP 108/66 | HR 98 | Temp 98.1°F | Wt 183.5 lb

## 2017-01-29 DIAGNOSIS — H1131 Conjunctival hemorrhage, right eye: Secondary | ICD-10-CM | POA: Diagnosis not present

## 2017-01-29 NOTE — Progress Notes (Signed)
Subjective:    Patient ID: Gwendolyn [name], female    DOB: Oct 31, 1970, 46 y.o.   MRN: [id number]  HPI  Pt presents to the clinic today with c/o right eye redness. She noticed this last night. It is not painful. She denies visual changes. She denies eye irritation, drainage or tearing. She has not tried anything OTC for her symptoms.   Review of Systems      Past Medical History:  Diagnosis Date  . Depression     Current Outpatient Prescriptions  Medication Sig Dispense Refill  . amphetamine-dextroamphetamine (ADDERALL) 30 MG tablet Take 30 mg by mouth 2 (two) times daily.   0  . ciprofloxacin (CIPRO) 250 MG tablet Take 1 tablet (250 mg total) by mouth 2 (two) times daily. 10 tablet 0  . diphenoxylate-atropine (LOMOTIL) 2.5-0.025 MG tablet Take 1 tablet by mouth 3 (three) times daily as needed for diarrhea or loose stools. 30 tablet 0  . DULoxetine (CYMBALTA) 60 MG capsule Take 60 mg by mouth daily.    Marland Kitchen gabapentin (NEURONTIN) 800 MG tablet TAKE 1 TABLET (800 MG TOTAL) BY MOUTH NIGHTLY. 90 tablet 3  . Ginkgo Biloba 40 MG TABS Take by mouth.    . hydrocortisone (ANUSOL-HC) 25 MG suppository Place 1 suppository (25 mg total) rectally 2 (two) times daily. 12 suppository 11  . Melatonin 3 MG TABS Take 1 tablet by mouth at bedtime.    . ondansetron (ZOFRAN) 4 MG tablet Take 1 tablet (4 mg total) by mouth every 8 (eight) hours as needed for nausea or vomiting. 20 tablet 0  . tiaGABine (GABITRIL) 4 MG tablet Take by mouth. Reported on 07/22/2015    . zolpidem (AMBIEN) 10 MG tablet Take 10 mg by mouth at bedtime as needed for sleep.     No current facility-administered medications for this visit.     Allergies  Allergen Reactions  . Amoxicillin-Pot Clavulanate     Other reaction(s): NAUSEA (Augmentin)  . Codeine Other (See Comments)    hallucinations  . Morphine Other (See Comments)  . Penicillins     nausea, diarrhea    Family History  Problem Relation Age of Onset  .  Meniere's disease Father   . Alcohol abuse Sister   . Heart disease Maternal Grandmother   . Heart disease Maternal Grandfather   . Heart disease Paternal Grandmother   . Heart disease Paternal Grandfather     Social History   Social History  . Marital status: Married    Spouse name: N/A  . Number of children: 2  . Years of education: N/A   Occupational History  . Not on file.   Social History Main Topics  . Smoking status: Former Smoker    Years: 20.00    Quit date: 04/23/2004  . Smokeless tobacco: Never Used     Comment: QUIT IN 2004  . Alcohol use 0.0 oz/week     Comment: OCCASIONALLY  . Drug use: No  . Sexual activity: Not on file   Other Topics Concern  . Not on file   Social History Narrative  . No narrative on file     Constitutional: Denies fever, malaise, fatigue, headache or abrupt weight changes.  HEENT: Pt reports eye redness. Denies eye pain, ear pain, ringing in the ears, wax buildup, runny nose, nasal congestion, bloody nose, or sore throat.   No other specific complaints in a complete review of systems (except as listed in HPI above).  Objective:   Physical Exam  BP 108/66   Pulse 98   Temp 98.1 F (36.7 C) (Oral)   Wt 183 lb 8 oz (83.2 kg)   SpO2 97%   BMI 29.62 kg/m  Wt Readings from Last 3 Encounters:  01/29/17 183 lb 8 oz (83.2 kg)  11/21/16 180 lb 9.6 oz (81.9 kg)  11/15/16 180 lb (81.6 kg)    General: Appears her stated age, well developed, well nourished in NAD. HEENT: Right Eye: Sclera with subconjunctival hemorrhage noted at 2 o'clock.  BMET    Component Value Date/Time   NA 135 11/14/2016 2014   NA 143 11/01/2016 0843   K 3.8 11/14/2016 2014   CL 105 11/14/2016 2014   CO2 22 11/14/2016 2014   GLUCOSE 109 (H) 11/14/2016 2014   BUN 10 11/14/2016 2014   BUN 11 11/01/2016 0843   CREATININE 0.75 11/14/2016 2014   CALCIUM 8.9 11/14/2016 2014   GFRNONAA >60 11/14/2016 2014   GFRAA >60 11/14/2016 2014    Lipid Panel      Component Value Date/Time   CHOL 203 (H) 11/01/2016 0843   TRIG 181 (H) 11/01/2016 0843   HDL 49 11/01/2016 0843   CHOLHDL 4.1 11/01/2016 0843   LDLCALC 118 (H) 11/01/2016 0843    CBC    Component Value Date/Time   WBC 7.1 11/21/2016 1505   RBC 4.39 11/21/2016 1505   HGB 13.5 11/21/2016 1505   HGB 14.2 11/01/2016 0843   HCT 40.1 11/21/2016 1505   HCT 42.5 11/01/2016 0843   PLT Voteout 11/21/2016 1505   PLT 280 11/01/2016 0843   MCV 91.4 11/21/2016 1505   MCV 92 11/01/2016 0843   MCV 90 08/08/2013 1242   MCH 30.0 11/14/2016 2014   MCHC 33.7 11/21/2016 1505   RDW 13.5 11/21/2016 1505   RDW 13.6 11/01/2016 0843   RDW 13.6 08/08/2013 1242   LYMPHSABS 1.9 11/21/2016 1505   LYMPHSABS 1.7 11/01/2016 0843   LYMPHSABS 1.3 08/08/2013 1242   MONOABS 0.5 11/21/2016 1505   MONOABS 0.5 08/08/2013 1242   EOSABS 0.1 11/21/2016 1505   EOSABS 0.1 11/01/2016 0843   EOSABS 0.1 08/08/2013 1242   BASOSABS 0.1 11/21/2016 1505   BASOSABS 0.0 11/01/2016 0843   BASOSABS 0.0 08/08/2013 1242    Hgb A1C Lab Results  Component Value Date   HGBA1C 5.7 07/22/2015           Assessment & Plan:   Subconjunctival Hemorrhage:  No intervention needed Advised her that this would resolve on its own  Return precautions discussed Webb Silversmith, NP

## 2017-01-29 NOTE — Patient Instructions (Signed)

## 2017-03-18 ENCOUNTER — Telehealth: Payer: Self-pay | Admitting: Emergency Medicine

## 2017-03-18 NOTE — Telephone Encounter (Signed)
Lost to followup 

## 2017-08-28 ENCOUNTER — Telehealth: Payer: Self-pay

## 2017-08-28 NOTE — Telephone Encounter (Signed)
Copied from [place] [id number]. 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

## 2017-08-28 NOTE — Telephone Encounter (Signed)
LMTCB. Please transfer pt to our office.  

## 2017-08-29 NOTE — Telephone Encounter (Signed)
Pt has been scheduled for 09/02/2017.

## 2017-09-02 ENCOUNTER — Ambulatory Visit: Payer: BC Managed Care – PPO | Admitting: Internal Medicine

## 2017-09-02 ENCOUNTER — Encounter: Payer: Self-pay | Admitting: Internal Medicine

## 2017-09-02 VITALS — BP 106/62 | HR 78 | Temp 98.4°F | Resp 15 | Ht 66.0 in | Wt 194.4 lb

## 2017-09-02 DIAGNOSIS — K625 Hemorrhage of anus and rectum: Secondary | ICD-10-CM | POA: Insufficient documentation

## 2017-09-02 DIAGNOSIS — E785 Hyperlipidemia, unspecified: Secondary | ICD-10-CM

## 2017-09-02 DIAGNOSIS — Z9049 Acquired absence of other specified parts of digestive tract: Secondary | ICD-10-CM | POA: Diagnosis not present

## 2017-09-02 MED ORDER — MESALAMINE 1000 MG RE SUPP: 1000.0000 mg | Freq: Every day | RECTAL | 12 refills | Status: DC

## 2017-09-02 NOTE — Patient Instructions (Addendum)
Trial of  mesalamine suppositories (NOT A  STEROID)   No toilet paper  1 marshmallow daily ( to thicken stools)   Sitz baths  Daily  Gi referral

## 2017-09-02 NOTE — Assessment & Plan Note (Addendum)
Trial of mesalamine Suppositories,  Sitz baths, avoid use of toilet paper,  GI referral to [facility].  INR,  CBC,  There labs ordered bt patient prefers to do them at [facility].

## 2017-09-02 NOTE — Progress Notes (Signed)
Subjective:  Patient ID: Gwendolyn [name], female    DOB: February 11, 1971  Age: 47 y.o. MRN: [id number]  CC: The primary encounter diagnosis was Rectal bleeding. Diagnoses of Hyperlipidemia LDL goal <100 and S/P colectomy were also pertinent to this visit.  HPI Gwendolyn [name] presents for evaluation and treatment of rectal pain and  bleeding.  Has been occurring sporadically over the past year,  With mostly spotting,  But for the last month the bleeding has become more frequent and profuse,  Occurring with and without stools. The pain is external.  She has been using Buddro's butt paste two times daily externally, and applying ice cubes for local control of pain,  Has resorted once or twice to rectal insertion of ice cube.   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  His anuscopy revealed no hemorrhoids  But noted  friability of the mucosa in the anal canal . She was advised to stop using steroid suppositories and  topicals to lessen friability  And trial of canasa was planned if patient's pain persisted,  But she did not return to him despite having persistent symptoms due to patient preference   Consistency of stool Is rarely formed despite use of 14 fiber capsules daily to thicken them.  Liquid stools cause the most pain.    Does not use stimulant laxatives.   Has 7 to 8 soft stools daily,  rarely liquid (unless she forgets to take the fiber supplements ) has not tried eating a marshmallow daily (reported as helpful by ostomy RN's and patients .   Outpatient Medications Prior to Visit  Medication Sig Dispense Refill  . amphetamine-dextroamphetamine (ADDERALL) 30 MG tablet Take 30 mg by mouth 2 (two) times daily.   0  . DULoxetine (CYMBALTA) 60 MG capsule Take 60 mg by mouth daily.    Marland Kitchen gabapentin (NEURONTIN) 800 MG  tablet TAKE 1 TABLET (800 MG TOTAL) BY MOUTH NIGHTLY. 90 tablet 3  . Ginkgo Biloba 40 MG TABS Take by mouth.    . Melatonin 3 MG TABS Take 1 tablet by mouth at bedtime.    . tiaGABine (GABITRIL) 4 MG tablet Take by mouth. Reported on 07/22/2015    . zolpidem (AMBIEN) 10 MG tablet Take 10 mg by mouth at bedtime as needed for sleep.    . hydrocortisone (ANUSOL-HC) 25 MG suppository Place 1 suppository (25 mg total) rectally 2 (two) times daily. (Patient not taking: Reported on 09/02/2017) 12 suppository 11  . diphenoxylate-atropine (LOMOTIL) 2.5-0.025 MG tablet Take 1 tablet by mouth 3 (three) times daily as needed for diarrhea or loose stools. (Patient not taking: Reported on 09/02/2017) 30 tablet 0  . ondansetron (ZOFRAN) 4 MG tablet Take 1 tablet (4 mg total) by mouth every 8 (eight) hours as needed for nausea or vomiting. (Patient not taking: Reported on 09/02/2017) 20 tablet 0   No facility-administered medications prior to visit.     Review of Systems;  Patient denies headache, fevers, malaise, unintentional weight loss, skin rash, eye pain, sinus congestion and sinus pain, sore throat, dysphagia,  hemoptysis , cough, dyspnea, wheezing, chest pain, palpitations, orthopnea, edema, abdominal pain, nausea, melena, diarrhea, constipation, flank pain, dysuria, hematuria, urinary  Frequency, nocturia, numbness, tingling, seizures,  Focal weakness, Loss of consciousness,  Tremor, insomnia, depression, anxiety,  and suicidal ideation.      Objective:  BP 106/62 (BP Location: Left Arm, Patient Position: Sitting, Cuff Size: Normal)   Pulse 78   Temp 98.4 F (36.9 C) (Oral)   Resp 15   Ht 5\' 6"  (1.676 m)   Wt 194 lb 6.4 oz (88.2 kg)   SpO2 98%   BMI 31.38 kg/m   BP Readings from Last 3 Encounters:  09/02/17 106/62  01/29/17 108/66  11/21/16 98/70    Wt Readings from Last 3 Encounters:  09/02/17 194 lb 6.4 oz (88.2 kg)  01/29/17 183 lb 8 oz (83.2 kg)  11/21/16 180 lb 9.6 oz (81.9 kg)     General appearance: alert, cooperative and appears stated age Lungs: clear to auscultation bilaterally Heart: regular rate and rhythm, S1, S2 normal, no murmur, click, rub or gallop Abdomen: soft, non-tender; bowel sounds normal; no masses,  no organomegaly Rectal: external skin tags, no hemorrhoids,  Rectal exam non tender.no masses Pulses: 2+ and symmetric Skin: Skin color, texture, turgor normal. No rashes or lesions Lymph nodes: Cervical, supraclavicular, and axillary nodes normal.  Lab Results  Component Value Date   HGBA1C 5.7 07/22/2015   HGBA1C 5.5 10/07/2014    Lab Results  Component Value Date   CREATININE 0.75 11/14/2016   CREATININE 0.77 11/01/2016   CREATININE 0.78 10/07/2014    Lab Results  Component Value Date   WBC 7.1 11/21/2016   HGB 13.5 11/21/2016   HCT 40.1 11/21/2016   PLT Voteout 11/21/2016   GLUCOSE 109 (H) 11/14/2016   CHOL 203 (H) 11/01/2016   TRIG 181 (H) 11/01/2016   HDL 49 11/01/2016   LDLCALC 118 (H) 11/01/2016   ALT 29 11/14/2016   [name] 35 11/14/2016   NA 135 11/14/2016   K 3.8 11/14/2016   CL 105 11/14/2016   CREATININE 0.75 11/14/2016   BUN 10 11/14/2016   CO2 22 11/14/2016   TSH 1.350 11/01/2016   HGBA1C 5.7 07/22/2015    No results found.  Assessment & Plan:   Problem List Items Addressed This Visit    S/P colectomy    Secondary to colonic inertia, With ileocolonic anastomosis  In 2014.  Her persistently  unformed stools may be causing proctitis vs anal fissure,  However her exam today was nontender.       Rectal bleeding - Primary    Trial of mesalamine Suppositories,  Sitz baths, avoid use of toilet paper,  GI referral to [facility].  INR,  CBC,  There labs ordered bt patient prefers to do them at [facility].        Relevant Orders   Ambulatory referral to Gastroenterology   DME Other see comment   CBC with Differential/Platelet   INR/PT    Other Visit Diagnoses    Hyperlipidemia LDL goal <100       Relevant  Orders   Comprehensive metabolic panel   Lipid panel      I have discontinued Gwendolyn [name]'s ondansetron and diphenoxylate-atropine. I am also having her start on mesalamine. Additionally, I am having her maintain her zolpidem, tiaGABine, Melatonin, DULoxetine, Ginkgo Biloba, gabapentin, hydrocortisone, and amphetamine-dextroamphetamine.  Meds ordered this encounter  Medications  . mesalamine (CANASA) 1000 MG suppository    Sig: Place 1 suppository (1,000 mg total) rectally at bedtime.    Dispense:  30 suppository    Refill:  12    Medications Discontinued During This Encounter  Medication Reason  . diphenoxylate-atropine (LOMOTIL) 2.5-0.025 MG tablet Patient has not  taken in last 30 days  . ondansetron (ZOFRAN) 4 MG tablet Patient has not taken in last 30 days    Follow-up: No follow-ups on file.   [name], MD

## 2017-09-03 NOTE — Assessment & Plan Note (Signed)
Secondary to colonic inertia, With ileocolonic anastomosis  In 2014.  Her persistently  unformed stools may be causing proctitis vs anal fissure,  However her exam today was nontender.

## 2017-09-11 ENCOUNTER — Encounter: Payer: Self-pay | Admitting: Physician Assistant

## 2017-09-12 ENCOUNTER — Encounter: Payer: Self-pay | Admitting: Internal Medicine

## 2017-09-25 ENCOUNTER — Other Ambulatory Visit: Payer: Self-pay | Admitting: Internal Medicine

## 2017-09-25 DIAGNOSIS — M797 Fibromyalgia: Secondary | ICD-10-CM

## 2017-10-02 ENCOUNTER — Other Ambulatory Visit: Payer: Self-pay | Admitting: Internal Medicine

## 2017-10-03 ENCOUNTER — Ambulatory Visit: Payer: BC Managed Care – PPO | Admitting: Physician Assistant

## 2017-10-03 ENCOUNTER — Encounter: Payer: Self-pay | Admitting: Internal Medicine

## 2017-10-03 ENCOUNTER — Encounter: Payer: Self-pay | Admitting: Physician Assistant

## 2017-10-03 VITALS — BP 110/70 | HR 82 | Ht 67.0 in | Wt 194.0 lb

## 2017-10-03 DIAGNOSIS — K625 Hemorrhage of anus and rectum: Secondary | ICD-10-CM

## 2017-10-03 DIAGNOSIS — Z9049 Acquired absence of other specified parts of digestive tract: Secondary | ICD-10-CM | POA: Diagnosis not present

## 2017-10-03 LAB — CBC WITH DIFFERENTIAL/PLATELET
BASOS: 0 %
Basophils Absolute: 0 10*3/uL (ref 0.0–0.2)
EOS (ABSOLUTE): 0.1 10*3/uL (ref 0.0–0.4)
EOS: 1 %
HEMATOCRIT: 42.7 % (ref 34.0–46.6)
Hemoglobin: 14.1 g/dL (ref 11.1–15.9)
IMMATURE GRANULOCYTES: 0 %
Immature Grans (Abs): 0 10*3/uL (ref 0.0–0.1)
LYMPHS: 26 %
Lymphocytes Absolute: 1.6 10*3/uL (ref 0.7–3.1)
MCH: 30.5 pg (ref 26.6–33.0)
MCHC: 33 g/dL (ref 31.5–35.7)
MCV: 92 fL (ref 79–97)
Monocytes Absolute: 0.5 10*3/uL (ref 0.1–0.9)
Monocytes: 8 %
NEUTROS PCT: 65 %
Neutrophils Absolute: 4.2 10*3/uL (ref 1.4–7.0)
PLATELETS: 318 10*3/uL (ref 150–450)
RBC: 4.62 x10E6/uL (ref 3.77–5.28)
RDW: 13.5 % (ref 12.3–15.4)
WBC: 6.4 10*3/uL (ref 3.4–10.8)

## 2017-10-03 LAB — COMPREHENSIVE METABOLIC PANEL
ALK PHOS: 37 IU/L — AB (ref 39–117)
ALT: 11 IU/L (ref 0–32)
AST: 14 IU/L (ref 0–40)
Albumin/Globulin Ratio: 2.2 (ref 1.2–2.2)
Albumin: 4.6 g/dL (ref 3.5–5.5)
BILIRUBIN TOTAL: 0.5 mg/dL (ref 0.0–1.2)
BUN / CREAT RATIO: 13 (ref 9–23)
BUN: 11 mg/dL (ref 6–24)
CALCIUM: 9.4 mg/dL (ref 8.7–10.2)
CHLORIDE: 99 mmol/L (ref 96–106)
CO2: 25 mmol/L (ref 20–29)
CREATININE: 0.83 mg/dL (ref 0.57–1.00)
GFR calc Af Amer: 96 mL/min/{1.73_m2} (ref >59)
GFR calc non Af Amer: 84 mL/min/{1.73_m2} (ref >59)
Globulin, Total: 2.1 g/dL (ref 1.5–4.5)
Glucose: 94 mg/dL (ref 65–99)
Potassium: 4.3 mmol/L (ref 3.5–5.2)
SODIUM: 138 mmol/L (ref 134–144)
Total Protein: 6.7 g/dL (ref 6.0–8.5)

## 2017-10-03 LAB — LIPID PANEL
CHOL/HDL RATIO: 4.7 ratio — AB (ref 0.0–4.4)
Cholesterol, Total: 210 mg/dL — ABNORMAL HIGH (ref 100–199)
HDL: 45 mg/dL (ref >39)
LDL Calculated: 127 mg/dL — ABNORMAL HIGH (ref 0–99)
Triglycerides: 191 mg/dL — ABNORMAL HIGH (ref 0–149)
VLDL CHOLESTEROL CAL: 38 mg/dL (ref 5–40)

## 2017-10-03 LAB — PROTIME-INR
INR: 0.9 (ref 0.8–1.2)
Prothrombin Time: 9.8 s (ref 9.1–12.0)

## 2017-10-03 NOTE — Progress Notes (Addendum)
Subjective:    Patient ID: Gwendolyn [name], female    DOB: 12-Aug-1970, 47 y.o.   MRN: [id number]  HPI Gwendolyn [name] is a pleasant 47 year old white female, new to GI today referred by Dr. Deborra Medina for evaluation of rectal bleeding.  Patient is referred to Dr. Hilarie Fredrickson. She has history of IBS, migraine headaches, depression, remote history of early stage breast cancer diagnosed at age 47, status post right lumpectomy.  She is also status post hysterectomy and underwent a subtotal colectomy in July 2014 at [facility] for severe colonic inertia.  She had a total colectomy without proctectomy, with ileo-proctostomy. She developed rectal bleeding after her surgery and has undergone internal hemorrhoidal banding per her surgeon in October 2014 and again in 2016.  She was seen by Dr. Audie Clear again in 2017 with complaints itching burning and small volume hematochezia and anoscopy showed no evidence of internal hemorrhoids. Patient says she currently has 7 or 8 bowel movements per day which is manageable for her.  This is not associated with any severe urgency and stools are generally semi-formed.  She has no current complaints of abdominal pain.  She did not have any issues with rectal bleeding over the past couple of years but says that symptoms started back again over the past several months.  She intermittently will see bright red blood with bowel movements sometimes even when she strains to urinate she will pass a small amount of bright red blood.  At times she has enough bleeding to color the water red.  No current complaints of anorectal pain.  She is currently having symptoms once a month or so lasting for 1 to 2 days.  Review of Systems Pertinent positive and negative review of systems were noted in the above HPI section.  All other review of systems was otherwise negative.  Outpatient Encounter Medications as of 10/03/2017  Medication Sig  . amphetamine-dextroamphetamine (ADDERALL) 30 MG tablet Take 30  mg by mouth 2 (two) times daily.   . DULoxetine (CYMBALTA) 60 MG capsule Take 60 mg by mouth daily.  Marland Kitchen gabapentin (NEURONTIN) 800 MG tablet TAKE 1 TABLET (800 MG TOTAL) BY MOUTH NIGHTLY.  . Ginkgo Biloba 40 MG TABS Take by mouth.  . hydrocortisone (ANUSOL-HC) 25 MG suppository Place 25 mg rectally 2 (two) times daily as needed for hemorrhoids or anal itching.  . Melatonin 3 MG TABS Take 1 tablet by mouth at bedtime.  . mesalamine (CANASA) 1000 MG suppository Place 1 suppository (1,000 mg total) rectally at bedtime.  . tiaGABine (GABITRIL) 4 MG tablet Takes 4mg  in the AM and 12mg  at bedtime  . zolpidem (AMBIEN) 10 MG tablet Take 10 mg by mouth at bedtime as needed for sleep.  . [DISCONTINUED] hydrocortisone (ANUSOL-HC) 25 MG suppository Place 1 suppository (25 mg total) rectally 2 (two) times daily. (Patient taking differently: Place 25 mg rectally 2 (two) times daily as needed. )   No facility-administered encounter medications on file as of 10/03/2017.    Allergies  Allergen Reactions  . Amoxicillin-Pot Clavulanate     Other reaction(s): NAUSEA (Augmentin)  . Codeine Other (See Comments)    hallucinations  . Morphine Other (See Comments)  . Penicillins     nausea, diarrhea   Patient Active Problem List   Diagnosis Date Noted  . Rectal bleeding 09/02/2017  . Encounter for preventive health examination 11/22/2016  . Traveler's diarrhea 11/22/2016  . E-coli UTI 11/22/2016  . History of abdominal supracervical subtotal hysterectomy 11/22/2016  . Achilles tendinitis  of both lower extremities 11/10/2015  . Plantar fasciitis 11/10/2015  . Overweight 07/23/2015  . Menopause 01/18/2015  . Hypercholesteremia 10/05/2014  . Vitamin D deficiency 10/05/2014  . Depressive disorder 10/05/2014  . History of breast cancer 10/05/2014  . Migraine without aura with status migrainosus 10/05/2014  . 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 Insomnia 10/05/2014  . Bipolar  2 disorder ([place]) 10/05/2014  . IBS (irritable bowel syndrome) 10/05/2014  . Obstructive sleep apnea 10/05/2014  . S/P colectomy 10/05/2014  . Hypersomnia 10/05/2014  . Anxiety 10/05/2014  . Fibromyalgia 10/05/2014  . Periodic limb movement disorder 10/05/2014   Social History   Socioeconomic History  . Marital status: Married    Spouse name: Not on file  . Number of children: 2  . Years of education: Not on file  . Highest education level: Not on file  Occupational History  . Occupation: Pharmacist, hospital  Social Needs  . Financial resource strain: Not on file  . Food insecurity:    Worry: Not on file    Inability: Not on file  . Transportation needs:    Medical: Not on file    Non-medical: Not on file  Tobacco Use  . Smoking status: Former Smoker    Years: 20.00    Last attempt to quit: 04/23/2002    Years since quitting: 15.4  . Smokeless tobacco: Never Used  . Tobacco comment: QUIT IN 2004  Substance and Sexual Activity  . Alcohol use: Yes    Alcohol/week: 0.0 oz    Comment: OCCASIONALLY  . Drug use: No  . Sexual activity: Not on file  Lifestyle  . Physical activity:    Days per week: Not on file    Minutes per session: Not on file  . Stress: Not on file  Relationships  . Social connections:    Talks on phone: Not on file    Gets together: Not on file    Attends religious service: Not on file    Active member of club or organization: Not on file    Attends meetings of clubs or organizations: Not on file    Relationship status: Not on file  . Intimate partner violence:    Fear of current or ex partner: Not on file    Emotionally abused: Not on file    Physically abused: Not on file    Forced sexual activity: Not on file  Other Topics Concern  . Not on file  Social History Narrative  . Not on file    Gwendolyn [name]'s family history includes Alcohol abuse in her sister; Heart disease in her maternal grandfather, maternal grandmother, paternal grandfather, and  paternal grandmother; Meniere's disease in her father; Rectal cancer in her other.      Objective:    Vitals:   10/03/17 1003  BP: 110/70  Pulse: 82    Physical Exam; well-developed white female in no acute distress, pleasant blood pressure 110/70 pulse 82, height 5 foot 7, weight 194, BMI 30.3.  HEENT; nontraumatic normocephalic EOMI PERRLA sclera anicteric oropharynx clear, Cardiovascular ;regular rate and rhythm with S1-S2 no murmur rub or gallop, Pulmonary; clear bilaterally, Abdomen,; soft, bowel sounds are present, no focal tenderness no guarding or rebound no palpable mass or hepatosplenomegaly, Rectal ;exam not done, Extremities; no clubbing cyanosis or edema skin warm and dry, Neuro psych ;alert and oriented, grossly nonfocal mood and affect appropriate       Assessment & Plan:   #73 47 year old white female status  post colectomy (ileo-proctostotomy) 2014 for colonic inertia who has done well postoperatively and generally now has 7-8 bowel movements per day. #2 history of internal hemorrhoids status post hemorrhoidal banding 2014,and 2016 #3 recurrent rectal bleeding intermittent over the past several months-etiology not clear.  Patient had a anoscopy in 2017 with no evidence of internal hemorrhoids. Rule out recurrent internal hemorrhoids, rule out proctitis, rule out anastomotic ulceration/inflammation, rule out occult lesion  #3 history of breast cancer status post lumpectomy right #4 migraines #5.  History of depression  Plan; Patient will be scheduled for flexible sigmoidoscopy with sedation with Dr. Hilarie Fredrickson.  She will prep with fleets enemas x2. Procedure was discussed in detail with the patient including indications, risks and benefits and she is agreeable to proceed.  I explained that if she does have internal hemorrhoids as etiology for her bleeding that these would not be treated at the time of the sigmoidoscopy but could be banded at a later date.     Irl Bodie S Manali Mcelmurry  PA-C 10/03/2017   Cc: [name], MD  Addendum: Reviewed and agree with initial management. Pyrtle, Lajuan Lines, MD

## 2017-10-03 NOTE — Patient Instructions (Signed)
You have been scheduled for a flexible sigmoidoscopy. Please follow the written instructions given to you at your visit today. If you use inhalers (even only as needed), please bring them with you on the day of your procedure.  

## 2017-10-14 ENCOUNTER — Encounter: Payer: Self-pay | Admitting: Internal Medicine

## 2017-10-14 ENCOUNTER — Other Ambulatory Visit: Payer: Self-pay

## 2017-10-14 ENCOUNTER — Other Ambulatory Visit: Payer: Self-pay | Admitting: Internal Medicine

## 2017-10-14 ENCOUNTER — Ambulatory Visit (AMBULATORY_SURGERY_CENTER): Payer: BC Managed Care – PPO | Admitting: Internal Medicine

## 2017-10-14 VITALS — BP 98/50 | HR 70 | Temp 98.4°F | Resp 13 | Ht 67.0 in | Wt 194.0 lb

## 2017-10-14 DIAGNOSIS — K625 Hemorrhage of anus and rectum: Secondary | ICD-10-CM | POA: Diagnosis not present

## 2017-10-14 DIAGNOSIS — K648 Other hemorrhoids: Secondary | ICD-10-CM

## 2017-10-14 MED ORDER — DILTIAZEM GEL 2 %: 1.0000 "application " | Freq: Two times a day (BID) | CUTANEOUS | 0 refills | Status: DC

## 2017-10-14 MED ORDER — SODIUM CHLORIDE 0.9 % IV SOLN: 500.0000 mL | Freq: Once | INTRAVENOUS | Status: DC

## 2017-10-14 NOTE — Patient Instructions (Signed)
  Please read handout on hemorrhoids provided.    YOU HAD AN ENDOSCOPIC PROCEDURE TODAY AT [facility]:   Refer to the procedure report that was given to you for any specific questions about what was found during the examination.  If the procedure report does not answer your questions, please call your gastroenterologist to clarify.  If you requested that your care partner not be given the details of your procedure findings, then the procedure report has been included in a sealed envelope for you to review at your convenience later.  YOU SHOULD EXPECT: Some feelings of bloating in the abdomen. Passage of more gas than usual.  Walking can help get rid of the air that was put into your GI tract during the procedure and reduce the bloating. If you had a lower endoscopy (such as a colonoscopy or flexible sigmoidoscopy) you may notice spotting of blood in your stool or on the toilet paper. If you underwent a bowel prep for your procedure, you may not have a normal bowel movement for a few days.  Please Note:  You might notice some irritation and congestion in your nose or some drainage.  This is from the oxygen used during your procedure.  There is no need for concern and it should clear up in a day or so.  SYMPTOMS TO REPORT IMMEDIATELY:   Following lower endoscopy (colonoscopy or flexible sigmoidoscopy):  Excessive amounts of blood in the stool  Significant tenderness or worsening of abdominal pains  Swelling of the abdomen that is new, acute  Fever of 100F or higher    For urgent or emergent issues, a gastroenterologist can be reached at any hour by calling [phone].   DIET:  We do recommend a small meal at first, but then you may proceed to your regular diet.  Drink plenty of fluids but you should avoid alcoholic beverages for 24 hours.  ACTIVITY:  You should plan to take it easy for the rest of today and you should NOT DRIVE or use heavy machinery until tomorrow  (because of the sedation medicines used during the test).    FOLLOW UP: Our staff will call the number listed on your records the next business day following your procedure to check on you and address any questions or concerns that you may have regarding the information given to you following your procedure. If we do not reach you, we will leave a message.  However, if you are feeling well and you are not experiencing any problems, there is no need to return our call.  We will assume that you have returned to your regular daily activities without incident.  If any biopsies were taken you will be contacted by phone or by letter within the next 1-3 weeks.  Please call us at [phone] if you have not heard about the biopsies in 3 weeks.    SIGNATURES/CONFIDENTIALITY: You and/or your care partner have signed paperwork which will be entered into your electronic medical record.  These signatures attest to the fact that that the information above on your After Visit Summary has been reviewed and is understood.  Full responsibility of the confidentiality of this discharge information lies with you and/or your care-partner.

## 2017-10-14 NOTE — Progress Notes (Signed)
A/ox3 pleased with MAC, report to RN 

## 2017-10-14 NOTE — Op Note (Signed)
[facility] Patient Name: Gwendolyn [name] Procedure Date: 10/14/2017 8:05 AM MRN: [id number] Endoscopist: Jerene Bears , MD Age: 47 Referring MD:  Date of Birth: [birth year] Gender: Female Account #: [id number] Procedure:                Flexible Sigmoidoscopy Indications:              Rectal hemorrhage, Anal pain, history of subtotal                            colectomy with ileorectal anastomosis for colonic                            inertia Medicines:                Monitored Anesthesia Care Procedure:                Pre-Anesthesia Assessment:                           - Prior to the procedure, a History and Physical                            was performed, and patient medications and                            allergies were reviewed. The patient's tolerance of                            previous anesthesia was also reviewed. The risks                            and benefits of the procedure and the sedation                            options and risks were discussed with the patient.                            All questions were answered, and informed consent                            was obtained. Prior Anticoagulants: The patient has                            taken no previous anticoagulant or antiplatelet                            agents. ASA Grade Assessment: II - A patient with                            mild systemic disease. After reviewing the risks                            and benefits, the patient was deemed in  satisfactory condition to undergo the procedure.                           After obtaining informed consent, the scope was                            passed under direct vision. The Colonoscope was                            introduced through the anus and advanced to the                            ileo-rectal anastomosis. The flexible sigmoidoscopy                            was accomplished without difficulty.  The patient                            tolerated the procedure well. The quality of the                            bowel preparation was fair. Scope In: Scope Out: Findings:                 Posterior and left-lateral anal fissures were found                            on perianal exam.                           Small, prolapsing, grade 2, internal hemorrhoids                            were found on perianal exam.                           There was evidence of a prior end-to-side                            ileo-rectal anastomosis in the recto-sigmoid colon.                            There was semi-solid and solid stool near the                            anastomosis which prevented complete visualization,                            but the anastomosis is widely patent. This was                            patent and was characterized by mild erythema. The                            anastomosis was traversed. The examined distal  ileum is normal in appearance.                           Normal mucosa was found in the rectum.                           Internal hemorrhoids were found during                            retroflexion. The hemorrhoids were small. Complications:            No immediate complications. Estimated Blood Loss:     Estimated blood loss: none. Impression:               - Anal fissure and grade 2 internal hemorrhoids                            found on perianal exam.                           - Patent end-to-side ileo-rectal anastomosis,                            characterized by mild erythema.                           - Normal examined distal ileum.                           - Normal mucosa in the rectum.                           - No specimens collected. Recommendation:           - Patient has a contact number available for                            emergencies. The signs and symptoms of potential                            delayed  complications were discussed with the                            patient. Return to normal activities tomorrow.                            Written discharge instructions were provided to the                            patient.                           - Resume previous diet.                           - Continue present medications.                           -  Treat fissure to resolution (call to notify me                            the fissure medication you already have. RectiCare                            can be added for anal pain per box instructions).                            If painless rectal bleeding (indicating that                            fissure is healed) is persistent hemorrhoid banding                            can be performed. Jerene Bears, MD 10/14/2017 8:38:09 AM This report has been signed electronically.

## 2017-10-15 ENCOUNTER — Telehealth: Payer: Self-pay | Admitting: *Deleted

## 2017-10-15 NOTE — Telephone Encounter (Signed)
  Follow up Call-  Call back number 10/14/2017  Post procedure Call Back phone  # [phone]  Permission to leave phone message Yes  Some recent data might be hidden     Patient questions:  Do you have a fever, pain , or abdominal swelling? No. Pain Score  0 *  Have you tolerated food without any problems? Yes.    Have you been able to return to your normal activities? Yes.    Do you have any questions about your discharge instructions: Diet   No. Medications  No. Follow up visit  No.  Do you have questions or concerns about your Care? No.  Actions: * If pain score is 4 or above: No action needed, pain <4.

## 2017-10-22 ENCOUNTER — Encounter: Payer: Self-pay | Admitting: Internal Medicine

## 2017-10-22 ENCOUNTER — Ambulatory Visit: Payer: BC Managed Care – PPO | Admitting: Internal Medicine

## 2017-10-22 ENCOUNTER — Other Ambulatory Visit (HOSPITAL_COMMUNITY)
Admission: RE | Admit: 2017-10-22 | Discharge: 2017-10-22 | Disposition: A | Discharge status: Home / Self Care | Payer: BC Managed Care – PPO | Source: Ambulatory Visit | Attending: Internal Medicine | Admitting: Internal Medicine

## 2017-10-22 VITALS — BP 102/60 | HR 93 | Temp 98.7°F | Ht 67.0 in | Wt 196.2 lb

## 2017-10-22 DIAGNOSIS — N760 Acute vaginitis: Secondary | ICD-10-CM

## 2017-10-22 MED ORDER — FLUCONAZOLE 150 MG PO TABS: 150.0000 mg | ORAL_TABLET | ORAL | 0 refills | Status: DC

## 2017-10-22 MED ORDER — METRONIDAZOLE 500 MG PO TABS: 500.0000 mg | ORAL_TABLET | Freq: Two times a day (BID) | ORAL | 0 refills | Status: DC

## 2017-10-22 MED ORDER — TERCONAZOLE 0.8 % VA CREA: 1.0000 | TOPICAL_CREAM | Freq: Every day | VAGINAL | 0 refills | Status: DC

## 2017-10-22 NOTE — Progress Notes (Signed)
Chief Complaint  Patient presents with  . Vaginitis   F/u  1. Vaginitis tried 3 nights of monistat normally does 7 days. Itching burning in vaginal area and woke up last night and did ice cube in vagina. She also has tried bathtub. She had flex sig Monday 1 week ago and has anal fissures which are now irritated and havnig 7-8 stools per day and using a bidet seat. No UTI sxs. She doesn't use soap/fragrance only water in private area.   Review of Systems  Genitourinary:       +vaginal irritation ? D/c    Past Medical History:  Diagnosis Date  . Anal fissure   . Anxiety   . Breast cancer ([place])   . Depression   . Fibromyalgia   . Gallstones   . Irritable bowel syndrome    Past Surgical History:  Procedure Laterality Date  . BREAST LUMPECTOMY Right 2003   Radiatiomn Therapy  . CHOLECYSTECTOMY  2000  . COLECTOMY  10/31/2012   SECONDARY TO COLONIC INERTIA  . SUPRACERVICAL ABDOMINAL HYSTERECTOMY  04/10/2011   But still has ovaries.   Family History  Problem Relation Age of Onset  . Meniere's disease Father   . Alcohol abuse Sister   . Heart disease Maternal Grandmother   . Rectal cancer Maternal Grandmother   . Heart disease Maternal Grandfather   . Heart disease Paternal Grandmother   . Heart disease Paternal Grandfather   . Rectal cancer Other        mat great gm  . Colon cancer Neg Hx   . Esophageal cancer Neg Hx    Social History   Socioeconomic History  . Marital status: Married    Spouse name: Not on file  . Number of children: 2  . Years of education: Not on file  . Highest education level: Not on file  Occupational History  . Occupation: Pharmacist, hospital  Social Needs  . Financial resource strain: Not on file  . Food insecurity:    Worry: Not on file    Inability: Not on file  . Transportation needs:    Medical: Not on file    Non-medical: Not on file  Tobacco Use  . Smoking status: Former Smoker    Years: 20.00    Last attempt to quit: 04/23/2002    Years  since quitting: 15.5  . Smokeless tobacco: Never Used  . Tobacco comment: QUIT IN 2004  Substance and Sexual Activity  . Alcohol use: Yes    Alcohol/week: 0.0 oz    Comment: OCCASIONALLY  . Drug use: No  . Sexual activity: Not on file  Lifestyle  . Physical activity:    Days per week: Not on file    Minutes per session: Not on file  . Stress: Not on file  Relationships  . Social connections:    Talks on phone: Not on file    Gets together: Not on file    Attends religious service: Not on file    Active member of club or organization: Not on file    Attends meetings of clubs or organizations: Not on file    Relationship status: Not on file  . Intimate partner violence:    Fear of current or ex partner: Not on file    Emotionally abused: Not on file    Physically abused: Not on file    Forced sexual activity: Not on file  Other Topics Concern  . Not on file  Social History Narrative  .  Not on file   Current Meds  Medication Sig  . amphetamine-dextroamphetamine (ADDERALL) 30 MG tablet Take 30 mg by mouth 2 (two) times daily.   Marland Kitchen diltiazem 2 % GEL Apply 1 application topically 2 (two) times daily. X 4-6 weeks  . DULoxetine (CYMBALTA) 60 MG capsule Take 60 mg by mouth daily.  Marland Kitchen gabapentin (NEURONTIN) 800 MG tablet TAKE 1 TABLET (800 MG TOTAL) BY MOUTH NIGHTLY.  . Ginkgo Biloba 40 MG TABS Take by mouth.  . hydrocortisone (ANUSOL-HC) 25 MG suppository Place 25 mg rectally 2 (two) times daily as needed for hemorrhoids or anal itching.  . Melatonin 3 MG TABS Take 1 tablet by mouth at bedtime.  . mesalamine (CANASA) 1000 MG suppository Place 1 suppository (1,000 mg total) rectally at bedtime.  . tiaGABine (GABITRIL) 4 MG tablet Takes 4mg  in the AM and 12mg  at bedtime  . zolpidem (AMBIEN) 10 MG tablet Take 10 mg by mouth at bedtime as needed for sleep.   Current Facility-Administered Medications for the 10/22/17 encounter (Office Visit) with McLean-Scocuzza, Nino Glow, MD  Medication    . 0.9 %  sodium chloride infusion   Allergies  Allergen Reactions  . Amoxicillin-Pot Clavulanate     Other reaction(s): NAUSEA (Augmentin)  . Codeine Other (See Comments)    hallucinations  . Morphine Other (See Comments)  . Penicillins     nausea, diarrhea   Recent Results (from the past 2160 hour(s))  CBC with Differential/Platelet     Status: None   Collection Time: 10/02/17  7:18 AM  Result Value Ref Range   WBC 6.4 3.4 - 10.8 x10E3/uL   RBC 4.62 3.77 - 5.28 x10E6/uL   Hemoglobin 14.1 11.1 - 15.9 g/dL   Hematocrit 42.7 34.0 - 46.6 %   MCV 92 79 - 97 fL   MCH 30.5 26.6 - 33.0 pg   MCHC 33.0 31.5 - 35.7 g/dL   RDW 13.5 12.3 - 15.4 %   Platelets 318 150 - 450 x10E3/uL   Neutrophils 65 Not Estab. %   Lymphs 26 Not Estab. %   Monocytes 8 Not Estab. %   Eos 1 Not Estab. %   Basos 0 Not Estab. %   Neutrophils Absolute 4.2 1.4 - 7.0 x10E3/uL   Lymphocytes Absolute 1.6 0.7 - 3.1 x10E3/uL   Monocytes Absolute 0.5 0.1 - 0.9 x10E3/uL   EOS (ABSOLUTE) 0.1 0.0 - 0.4 x10E3/uL   Basophils Absolute 0.0 0.0 - 0.2 x10E3/uL   Immature Granulocytes 0 Not Estab. %   Immature Grans (Abs) 0.0 0.0 - 0.1 x10E3/uL  Comprehensive metabolic panel     Status: Abnormal   Collection Time: 10/02/17  7:18 AM  Result Value Ref Range   Glucose 94 65 - 99 mg/dL   BUN 11 6 - 24 mg/dL   Creatinine, Ser 0.83 0.57 - 1.00 mg/dL   GFR calc non Af Amer 84 >59 mL/min/1.73   GFR calc Af Amer 96 >59 mL/min/1.73   BUN/Creatinine Ratio 13 9 - 23   Sodium 138 134 - 144 mmol/L   Potassium 4.3 3.5 - 5.2 mmol/L   Chloride 99 96 - 106 mmol/L   CO2 25 20 - 29 mmol/L   Calcium 9.4 8.7 - 10.2 mg/dL   Total Protein 6.7 6.0 - 8.5 g/dL   Albumin 4.6 3.5 - 5.5 g/dL   Globulin, Total 2.1 1.5 - 4.5 g/dL   Albumin/Globulin Ratio 2.2 1.2 - 2.2   Bilirubin Total 0.5 0.0 - 1.2 mg/dL  Alkaline Phosphatase 37 (L) 39 - 117 IU/L   AST 14 0 - 40 IU/L   ALT 11 0 - 32 IU/L  Lipid panel     Status: Abnormal   Collection  Time: 10/02/17  7:18 AM  Result Value Ref Range   Cholesterol, Total 210 (H) 100 - 199 mg/dL   Triglycerides 191 (H) 0 - 149 mg/dL   HDL 45 >39 mg/dL   VLDL Cholesterol Cal 38 5 - 40 mg/dL   LDL Calculated 127 (H) 0 - 99 mg/dL   Chol/HDL Ratio 4.7 (H) 0.0 - 4.4 ratio    Comment:                                   T. Chol/HDL Ratio                                             Men  Women                               1/2 Avg.Risk  3.4    3.3                                   Avg.Risk  5.0    4.4                                2X Avg.Risk  9.6    7.1                                3X Avg.Risk 23.4   11.0   Protime-INR     Status: None   Collection Time: 10/02/17  7:18 AM  Result Value Ref Range   INR 0.9 0.8 - 1.2    Comment: Reference interval is for non-anticoagulated patients. Suggested INR therapeutic range for Vitamin K antagonist therapy:    Standard Dose (moderate intensity                   therapeutic range):       2.0 - 3.0    Higher intensity therapeutic range       2.5 - 3.5    Prothrombin Time 9.8 9.1 - 12.0 sec   Objective  Body mass index is 30.73 kg/m. Wt Readings from Last 3 Encounters:  10/22/17 196 lb 3.2 oz (89 kg)  10/14/17 194 lb (88 kg)  10/03/17 194 lb (88 kg)   Temp Readings from Last 3 Encounters:  10/22/17 98.7 F (37.1 C) (Oral)  10/14/17 98.4 F (36.9 C) (Temporal)  09/02/17 98.4 F (36.9 C) (Oral)   BP Readings from Last 3 Encounters:  10/22/17 102/60  10/14/17 (!) 98/50  10/03/17 110/70   Pulse Readings from Last 3 Encounters:  10/22/17 93  10/14/17 70  10/03/17 82    Physical Exam  Constitutional: She is oriented to person, place, and time. Vital signs are normal. She appears well-developed and well-nourished. She is cooperative.  HENT:  Head: Normocephalic and atraumatic.  Mouth/Throat: Oropharynx is clear and moist and mucous membranes are  normal.  Eyes: Pupils are equal, round, and reactive to light. Conjunctivae are normal.    Cardiovascular: Normal rate, regular rhythm and normal heart sounds.  Pulmonary/Chest: Effort normal and breath sounds normal.  Genitourinary:    Pelvic exam was performed with patient supine. There is rash on the left labia. Cervix exhibits discharge.  Genitourinary Comments: White thick discharge   Neurological: She is alert and oriented to person, place, and time. Gait normal.  Skin: Skin is warm, dry and intact.  Psychiatric: She has a normal mood and affect. Her speech is normal and behavior is normal. Judgment and thought content normal. Cognition and memory are normal.  Nursing note and vitals reviewed.   Assessment   1. Vaginitis BV vs yeast Plan  1. Diflucan q3 days x 3 doses  Flagyl bid x 1 week  Terconazole x 3 nights hold monistat for now  Wet prep today  Provider: Dr. Olivia Mackie McLean-Scocuzza-[facility] Medicine

## 2017-10-22 NOTE — Progress Notes (Signed)
Pre visit review using our clinic review tool, if applicable. No additional management support is needed unless otherwise documented below in the visit note. 

## 2017-10-22 NOTE — Patient Instructions (Addendum)
F/u as sch with PCP  Use Terconazole at night x 3 days  Take diflucan every 3 days x 3 doses  Take flagyl 2x per day x 1 week   Vaginitis Vaginitis is a condition in which the vaginal tissue swells and becomes red (inflamed). This condition is most often caused by a change in the normal balance of bacteria and yeast that live in the vagina. This change causes an overgrowth of certain bacteria or yeast, which causes the inflammation. There are different types of vaginitis, but the most common types are:  Bacterial vaginosis.  Yeast infection (candidiasis).  Trichomoniasis vaginitis. This is a sexually transmitted disease (STD).  Viral vaginitis.  Atrophic vaginitis.  Allergic vaginitis.  What are the causes? The cause of this condition depends on the type of vaginitis. It can be caused by:  Bacteria (bacterial vaginosis).  Yeast, which is a fungus (yeast infection).  A parasite (trichomoniasis vaginitis).  A virus (viral vaginitis).  Low hormone levels (atrophic vaginitis). Low hormone levels can occur during pregnancy, breastfeeding, or after menopause.  Irritants, such as bubble baths, scented tampons, and feminine sprays (allergic vaginitis).  Other factors can change the normal balance of the yeast and bacteria that live in the vagina. These include:  Antibiotic medicines.  Poor hygiene.  Diaphragms, vaginal sponges, spermicides, birth control pills, and intrauterine devices (IUD).  Sex.  Infection.  Uncontrolled diabetes.  A weakened defense (immune) system.  What increases the risk? This condition is more likely to develop in women who:  Smoke.  Use vaginal douches, scented tampons, or scented sanitary pads.  Wear tight-fitting pants.  Wear thong underwear.  Use oral birth control pills or an IUD.  Have sex without a condom.  Have multiple sex partners.  Have an STD.  Frequently use the spermicide nonoxynol-9.  Eat lots of foods high in  sugar.  Have uncontrolled diabetes.  Have low estrogen levels.  Have a weakened immune system from an immune disorder or medical treatment.  Are pregnant or breastfeeding.  What are the signs or symptoms? Symptoms vary depending on the cause of the vaginitis. Common symptoms include:  Abnormal vaginal discharge. ? The discharge is white, gray, or yellow with bacterial vaginosis. ? The discharge is thick, white, and cheesy with a yeast infection. ? The discharge is frothy and yellow or greenish with trichomoniasis.  A bad vaginal smell. The smell is fishy with bacterial vaginosis.  Vaginal itching, pain, or swelling.  Sex that is painful.  Pain or burning when urinating.  Sometimes there are no symptoms. How is this diagnosed? This condition is diagnosed based on your symptoms and medical history. A physical exam, including a pelvic exam, will also be done. You may also have other tests, including:  Tests to determine the pH level (acidity or alkalinity) of your vagina.  A whiff test, to assess the odor that results when a sample of your vaginal discharge is mixed with a potassium hydroxide solution.  Tests of vaginal fluid. A sample will be examined under a microscope.  How is this treated? Treatment varies depending on the type of vaginitis you have. Your treatment may include:  Antibiotic creams or pills to treat bacterial vaginosis and trichomoniasis.  Antifungal medicines, such as vaginal creams or suppositories, to treat a yeast infection.  Medicine to ease discomfort if you have viral vaginitis. Your sexual partner should also be treated.  Estrogen delivered in a cream, pill, suppository, or vaginal ring to treat atrophic vaginitis. If vaginal  dryness occurs, lubricants and moisturizing creams may help. You may need to avoid scented soaps, sprays, or douches.  Stopping use of a product that is causing allergic vaginitis. Then using a vaginal cream to treat the  symptoms.  Follow these instructions at home: Lifestyle  Keep your genital area clean and dry. Avoid soap, and only rinse the area with water.  Do not douche or use tampons until your health care provider says it is okay to do so. Use sanitary pads, if needed.  Do not have sex until your health care provider approves. When you can return to sex, practice safe sex and use condoms.  Wipe from front to back. This avoids the spread of bacteria from the rectum to the vagina. General instructions  Take over-the-counter and prescription medicines only as told by your health care provider.  If you were prescribed an antibiotic medicine, take or use it as told by your health care provider. Do not stop taking or using the antibiotic even if you start to feel better.  Keep all follow-up visits as told by your health care provider. This is important. How is this prevented?  Use mild, non-scented products. Do not use things that can irritate the vagina, such as fabric softeners. Avoid the following products if they are scented: ? Feminine sprays. ? Detergents. ? Tampons. ? Feminine hygiene products. ? Soaps or bubble baths.  Let air reach your genital area. ? Wear cotton underwear to reduce moisture buildup. ? Avoid wearing underwear while you sleep. ? Avoid wearing tight pants and underwear or nylons without a cotton panel. ? Avoid wearing thong underwear.  Take off any wet clothing, such as bathing suits, as soon as possible.  Practice safe sex and use condoms. Contact a health care provider if:  You have abdominal pain.  You have a fever.  You have symptoms that last for more than 2-3 days. Get help right away if:  You have a fever and your symptoms suddenly get worse. Summary  Vaginitis is a condition in which the vaginal tissue becomes inflamed.This condition is most often caused by a change in the normal balance of bacteria and yeast that live in the vagina.  Treatment  varies depending on the type of vaginitis you have.  Do not douche, use tampons , or have sex until your health care provider approves. When you can return to sex, practice safe sex and use condoms. This information is not intended to replace advice given to you by your health care provider. Make sure you discuss any questions you have with your health care provider. Document Released: 02/04/2007 Document Revised: 05/15/2016 Document Reviewed: 05/15/2016 Elsevier Interactive Patient Education  [employer].

## 2017-10-23 LAB — CERVICOVAGINAL ANCILLARY ONLY: Wet Prep (BD Affirm): NEGATIVE

## 2017-12-02 ENCOUNTER — Encounter: Payer: Self-pay | Admitting: Internal Medicine

## 2017-12-02 ENCOUNTER — Ambulatory Visit (INDEPENDENT_AMBULATORY_CARE_PROVIDER_SITE_OTHER): Payer: BC Managed Care – PPO | Admitting: Internal Medicine

## 2017-12-02 VITALS — BP 104/62 | HR 79 | Temp 98.6°F | Resp 15 | Ht 67.0 in | Wt 195.0 lb

## 2017-12-02 DIAGNOSIS — Z1231 Encounter for screening mammogram for malignant neoplasm of breast: Secondary | ICD-10-CM

## 2017-12-02 DIAGNOSIS — M797 Fibromyalgia: Secondary | ICD-10-CM

## 2017-12-02 DIAGNOSIS — Z1239 Encounter for other screening for malignant neoplasm of breast: Secondary | ICD-10-CM

## 2017-12-02 DIAGNOSIS — E669 Obesity, unspecified: Secondary | ICD-10-CM | POA: Diagnosis not present

## 2017-12-02 DIAGNOSIS — Z0000 Encounter for general adult medical examination without abnormal findings: Secondary | ICD-10-CM

## 2017-12-02 MED ORDER — METFORMIN HCL ER 500 MG PO TB24: 500.0000 mg | ORAL_TABLET | Freq: Every day | ORAL | 0 refills | Status: DC

## 2017-12-02 MED ORDER — ZOLPIDEM TARTRATE 10 MG PO TABS: 10.0000 mg | ORAL_TABLET | Freq: Every evening | ORAL | 5 refills | Status: DC | PRN

## 2017-12-02 NOTE — Progress Notes (Signed)
Patient ID: Gwendolyn [name], female    DOB: 01-02-1971  Age: 47 y.o. MRN: [id number]  The patient is here for annual non gyn exam examination and management of other chronic and acute problems.  S/p hysterectomy Remote history BRCA in situ, genetic testing was  negative  The risk factors are reflected in the social history.  The roster of all physicians providing medical care to patient - is listed in the Snapshot section of the chart.  Activities of daily living:  The patient is 100% independent in all ADLs: dressing, toileting, feeding as well as independent mobility  Home safety : The patient has smoke detectors in the home. They wear seatbelts.  There are no firearms at home. There is no violence in the home.   There is no risks for hepatitis, STDs or HIV. There is no   history of blood transfusion. They have no travel history to infectious disease endemic areas of the world.  The patient has seen their dentist in the last six month. They have seen their eye doctor in the last year. They admit to slight hearing difficulty with regard to whispered voices and some television programs.  They have deferred audiologic testing in the last year.  They do not  have excessive sun exposure. Discussed the need for sun protection: hats, long sleeves and use of sunscreen if there is significant sun exposure.   Diet: the importance of a healthy diet is discussed. They do have a healthy diet.  The benefits of regular aerobic exercise were discussed. She walks 4 times per week ,  20 minutes.   Depression screen: there are no signs or vegative symptoms of depression- irritability, change in appetite, anhedonia, sadness/tearfullness.  Cognitive assessment: the patient manages all their financial and personal affairs and is actively engaged. They could relate day,date,year and events; recalled 2/3 objects at 3 minutes; performed clock-face test normally.  The following portions of the patient's  history were reviewed and updated as appropriate: allergies, current medications, past family history, past medical history,  past surgical history, past social history  and problem list.  Visual acuity was not assessed per patient preference since she has regular follow up with her ophthalmologist. Hearing and body mass index were assessed and reviewed.   During the course of the visit the patient was educated and counseled about appropriate screening and preventive services including : fall prevention , diabetes screening, nutrition counseling, colorectal cancer screening, and recommended immunizations.    CC: The primary encounter diagnosis was Breast cancer screening. Diagnoses of Encounter for preventive health examination, Obesity (BMI 30.0-34.9), and Fibromyalgia were also pertinent to this visit.  Treated for acute vaginitis in July by [facility]: oral  flagyl bid x 7 days and topical terconazole 0.8% vaginal cream g x 3 days given . Vaginal culture was negative for yeast,  Trich and BV .  Realized that her symptoms were being caused overenthusiastic use of a new bidet and resolved once she stopped using it.   Had flex sig  In June by Pyrtle  rectal bleeding:  2 amal fissures found , internal hemorrhoids  Given diltiazem 2% gel for bid use x 4-6 weeks ., plus recticare for pain     Has been reading about beneficial effects of metformin on fibromyalgia pain levels,  Wants to try it.   [name] has a past medical history of Anal fissure, Anxiety, Breast cancer ([place]), Depression, Fibromyalgia, Gallstones, Irritable bowel syndrome, and Yeast vaginitis.   She has a  past surgical history that includes Cholecystectomy (2000); Supracervical abdominal hysterectomy (04/10/2011); Breast lumpectomy (Right, 2003); and Colectomy (10/31/2012).   Her family history includes Alcohol abuse in her sister; Heart disease in her maternal grandfather, maternal grandmother, paternal grandfather, and paternal  grandmother; Meniere's disease in her father; Rectal cancer in her maternal grandmother and other.She reports that she quit smoking about 15 years ago. She quit after 20.00 years of use. She has never used smokeless tobacco. She reports that she drinks alcohol. She reports that she does not use drugs.  Outpatient Medications Prior to Visit  Medication Sig Dispense Refill  . amphetamine-dextroamphetamine (ADDERALL) 30 MG tablet Take 30 mg by mouth 2 (two) times daily.   0  . diltiazem 2 % GEL Apply 1 application topically 2 (two) times daily. X 4-6 weeks 60 g 0  . DULoxetine (CYMBALTA) 60 MG capsule Take 60 mg by mouth daily.    Marland Kitchen gabapentin (NEURONTIN) 800 MG tablet TAKE 1 TABLET (800 MG TOTAL) BY MOUTH NIGHTLY. 90 tablet 2  . Ginkgo Biloba 40 MG TABS Take by mouth.    . hydrocortisone (ANUSOL-HC) 25 MG suppository Place 25 mg rectally 2 (two) times daily as needed for hemorrhoids or anal itching.    . Melatonin 3 MG TABS Take 1 tablet by mouth at bedtime.    Marland Kitchen terconazole (TERAZOL 3) 0.8 % vaginal cream Place 1 applicator vaginally at bedtime. X 3 days 20 g 0  . tiaGABine (GABITRIL) 4 MG tablet Takes 80m in the AM and 124mat bedtime    . zolpidem (AMBIEN) 10 MG tablet Take 10 mg by mouth at bedtime as needed for sleep.    . fluconazole (DIFLUCAN) 150 MG tablet Take 1 tablet (150 mg total) by mouth every 3 (three) days. (Patient not taking: Reported on 12/02/2017) 3 tablet 0  . mesalamine (CANASA) 1000 MG suppository Place 1 suppository (1,000 mg total) rectally at bedtime. (Patient not taking: Reported on 12/02/2017) 30 suppository 12  . metroNIDAZOLE (FLAGYL) 500 MG tablet Take 1 tablet (500 mg total) by mouth 2 (two) times daily. (Patient not taking: Reported on 12/02/2017) 14 tablet 0   Facility-Administered Medications Prior to Visit  Medication Dose Route Frequency Provider Last Rate Last Dose  . 0.9 %  sodium chloride infusion  500 mL Intravenous Once Pyrtle, JaLajuan LinesMD        Review of  Systems   Patient denies headache, fevers, malaise, unintentional weight loss, skin rash, eye pain, sinus congestion and sinus pain, sore throat, dysphagia,  hemoptysis , cough, dyspnea, wheezing, chest pain, palpitations, orthopnea, edema, abdominal pain, nausea, melena, diarrhea, constipation, flank pain, dysuria, hematuria, urinary  Frequency, nocturia, numbness, tingling, seizures,  Focal weakness, Loss of consciousness,  Tremor, insomnia, depression, anxiety, and suicidal ideation.      Objective:  BP 104/62 (BP Location: Left Arm, Patient Position: Sitting, Cuff Size: Normal)   Pulse 79   Temp 98.6 F (37 C) (Oral)   Resp 15   Ht _0  (1.702 m)   Wt 195 lb (88.5 kg)   SpO2 96%   BMI 30.54 kg/m   Physical Exam   General appearance: alert, cooperative and appears stated age Head: Normocephalic, without obvious abnormality, atraumatic Eyes: conjunctivae/corneas clear. PERRL, EOM's intact. Fundi benign. Ears: normal TM's and external ear canals both ears Nose: Nares normal. Septum midline. Mucosa normal. No drainage or sinus tenderness. Throat: lips, mucosa, and tongue normal; teeth and gums normal Neck: no adenopathy, no carotid bruit, no  JVD, supple, symmetrical, trachea midline and thyroid not enlarged, symmetric, no tenderness/mass/nodules Lungs: clear to auscultation bilaterally Breasts: normal appearance, no masses or tenderness Heart: regular rate and rhythm, S1, S2 normal, no murmur, click, rub or gallop Abdomen: soft, non-tender; bowel sounds normal; no masses,  no organomegaly Extremities: extremities normal, atraumatic, no cyanosis or edema Pulses: 2+ and symmetric Skin: Skin color, texture, turgor normal. No rashes or lesions Neurologic: Alert and oriented X 3, normal strength and tone. Normal symmetric reflexes. Normal coordination and gait.      Assessment & Plan:   Problem List Items Addressed This Visit    Fibromyalgia    Trial of metformin        Obesity (BMI 30.0-34.9)    Trial of metformin. I have addressed  BMI and recommended wt loss of 10% of body weigh over the next 6 months using a low glycemic index diet and regular exercise a minimum of 5 days per week.        Encounter for preventive health examination    Annual comprehensive preventive exam was done as well as an evaluation and management of chronic conditions .  During the course of the visit the patient was educated and counseled about appropriate screening and preventive services including :  diabetes screening, lipid analysis with projected  10 year  risk for CAD , nutrition counseling, breast, cervical and colorectal cancer screening, and recommended immunizations.  Printed recommendations for health maintenance screenings was given       Other Visit Diagnoses    Breast cancer screening    -  Primary   Relevant Orders   MM 3D SCREEN BREAST BILATERAL      I have changed Gwendolyn [name]'s zolpidem. I am also having her start on metFORMIN. Additionally, I am having her maintain her tiaGABine, Melatonin, DULoxetine, Ginkgo Biloba, amphetamine-dextroamphetamine, mesalamine, gabapentin, hydrocortisone, diltiazem, fluconazole, metroNIDAZOLE, and terconazole. We will continue to administer sodium chloride.  Meds ordered this encounter  Medications  . metFORMIN (GLUCOPHAGE-XR) 500 MG 24 hr tablet    Sig: Take 1 tablet (500 mg total) by mouth daily with breakfast.    Dispense:  90 tablet    Refill:  0  . zolpidem (AMBIEN) 10 MG tablet    Sig: Take 1 tablet (10 mg total) by mouth at bedtime as needed for sleep.    Dispense:  30 tablet    Refill:  5    Medications Discontinued During This Encounter  Medication Reason  . zolpidem (AMBIEN) 10 MG tablet Reorder    Follow-up: Return in about 6 months (around 06/04/2018).   [name], MD

## 2017-12-02 NOTE — Patient Instructions (Addendum)
Trial of metformin XR once daily in the evening for "prediabetes"/fibromyalgia"  Your daughter does not need genetic testing since your  BRCA1/2 was negative, but if you want to be sure.  Tell her to ask for a referral to a genetics counsellor, who will listen to the entire family history and decide if genetic testing is warranted.  Most oncology depts have one [facility] even does)   Your mammogram has been ordered.  You can call to make your appointment at [facility], Female Adopting a healthy lifestyle and getting preventive care can go a long way to promote health and wellness. Talk with your health care provider about what schedule of regular examinations is right for you. This is a good chance for you to check in with your provider about disease prevention and staying healthy. In between checkups, there are plenty of things you can do on your own. Experts have done a lot of research about which lifestyle changes and preventive measures are most likely to keep you healthy. Ask your health care provider for more information. Weight and diet Eat a healthy diet  Be sure to include plenty of vegetables, fruits, low-fat dairy products, and lean protein.  Do not eat a lot of foods high in solid fats, added sugars, or salt.  Get regular exercise. This is one of the most important things you can do for your health. ? Most adults should exercise for at least 150 minutes each week. The exercise should increase your heart rate and make you sweat (moderate-intensity exercise). ? Most adults should also do strengthening exercises at least twice a week. This is in addition to the moderate-intensity exercise.  Maintain a healthy weight  Body mass index (BMI) is a measurement that can be used to identify possible weight problems. It estimates body fat based on height and weight. Your health care provider can help determine your BMI and help you achieve or maintain a healthy  weight.  For females 49 years of age and older: ? A BMI below 18.5 is considered underweight. ? A BMI of 18.5 to 24.9 is normal. ? A BMI of 25 to 29.9 is considered overweight. ? A BMI of 30 and above is considered obese.  Watch levels of cholesterol and blood lipids  You should start having your blood tested for lipids and cholesterol at 49 years of age, then have this test every 5 years.  You may need to have your cholesterol levels checked more often if: ? Your lipid or cholesterol levels are high. ? You are older than 49 years of age. ? You are at high risk for heart disease.  Cancer screening Lung Cancer  Lung cancer screening is recommended for adults 32-68 years old who are at high risk for lung cancer because of a history of smoking.  A yearly low-dose CT scan of the lungs is recommended for people who: ? Currently smoke. ? Have quit within the past 15 years. ? Have at least a 30-pack-year history of smoking. A pack year is smoking an average of one pack of cigarettes a day for 1 year.  Yearly screening should continue until it has been 15 years since you quit.  Yearly screening should stop if you develop a health problem that would prevent you from having lung cancer treatment.  Breast Cancer  Practice breast self-awareness. This means understanding how your breasts normally appear and feel.  It also means doing regular breast self-exams. Let your health  care provider know about any changes, no matter how small.  If you are in your 20s or 30s, you should have a clinical breast exam (CBE) by a health care provider every 1-3 years as part of a regular health exam.  If you are 40 or older, have a CBE every year. Also consider having a breast X-ray (mammogram) every year.  If you have a family history of breast cancer, talk to your health care provider about genetic screening.  If you are at high risk for breast cancer, talk to your health care provider about having an  MRI and a mammogram every year.  Breast cancer gene (BRCA) assessment is recommended for women who have family members with BRCA-related cancers. BRCA-related cancers include: ? Breast. ? Ovarian. ? Tubal. ? Peritoneal cancers.  Results of the assessment will determine the need for genetic counseling and BRCA1 and BRCA2 testing.  Cervical Cancer Your health care provider may recommend that you be screened regularly for cancer of the pelvic organs (ovaries, uterus, and vagina). This screening involves a pelvic examination, including checking for microscopic changes to the surface of your cervix (Pap test). You may be encouraged to have this screening done every 3 years, beginning at age 21.  For women ages 30-65, health care providers may recommend pelvic exams and Pap testing every 3 years, or they may recommend the Pap and pelvic exam, combined with testing for human papilloma virus (HPV), every 5 years. Some types of HPV increase your risk of cervical cancer. Testing for HPV may also be done on women of any age with unclear Pap test results.  Other health care providers may not recommend any screening for nonpregnant women who are considered low risk for pelvic cancer and who do not have symptoms. Ask your health care provider if a screening pelvic exam is right for you.  If you have had past treatment for cervical cancer or a condition that could lead to cancer, you need Pap tests and screening for cancer for at least 20 years after your treatment. If Pap tests have been discontinued, your risk factors (such as having a new sexual partner) need to be reassessed to determine if screening should resume. Some women have medical problems that increase the chance of getting cervical cancer. In these cases, your health care provider may recommend more frequent screening and Pap tests.  Colorectal Cancer  This type of cancer can be detected and often prevented.  Routine colorectal cancer screening  usually begins at 50 years of age and continues through 49 years of age.  Your health care provider may recommend screening at an earlier age if you have risk factors for colon cancer.  Your health care provider may also recommend using home test kits to check for hidden blood in the stool.  A small camera at the end of a tube can be used to examine your colon directly (sigmoidoscopy or colonoscopy). This is done to check for the earliest forms of colorectal cancer.  Routine screening usually begins at age 50.  Direct examination of the colon should be repeated every 5-10 years through 49 years of age. However, you may need to be screened more often if early forms of precancerous polyps or small growths are found.  Skin Cancer  Check your skin from head to toe regularly.  Tell your health care provider about any new moles or changes in moles, especially if there is a change in a mole's shape or color.  Also tell   your health care provider if you have a mole that is larger than the size of a pencil eraser.  Always use sunscreen. Apply sunscreen liberally and repeatedly throughout the day.  Protect yourself by wearing long sleeves, pants, a wide-brimmed hat, and sunglasses whenever you are outside.  Heart disease, diabetes, and high blood pressure  High blood pressure causes heart disease and increases the risk of stroke. High blood pressure is more likely to develop in: ? People who have blood pressure in the high end of the normal range (130-139/85-89 mm Hg). ? People who are overweight or obese. ? People who are African American.  If you are 52-30 years of age, have your blood pressure checked every 3-5 years. If you are 28 years of age or older, have your blood pressure checked every year. You should have your blood pressure measured twice-once when you are at a hospital or clinic, and once when you are not at a hospital or clinic. Record the average of the two measurements. To check  your blood pressure when you are not at a hospital or clinic, you can use: ? An automated blood pressure machine at a pharmacy. ? A home blood pressure monitor.  If you are between 104 years and 18 years old, ask your health care provider if you should take aspirin to prevent strokes.  Have regular diabetes screenings. This involves taking a blood sample to check your fasting blood sugar level. ? If you are at a normal weight and have a low risk for diabetes, have this test once every three years after 49 years of age. ? If you are overweight and have a high risk for diabetes, consider being tested at a younger age or more often. Preventing infection Hepatitis B  If you have a higher risk for hepatitis B, you should be screened for this virus. You are considered at high risk for hepatitis B if: ? You were born in a country where hepatitis B is common. Ask your health care provider which countries are considered high risk. ? Your parents were born in a high-risk country, and you have not been immunized against hepatitis B (hepatitis B vaccine). ? You have HIV or AIDS. ? You use needles to inject street drugs. ? You live with someone who has hepatitis B. ? You have had sex with someone who has hepatitis B. ? You get hemodialysis treatment. ? You take certain medicines for conditions, including cancer, organ transplantation, and autoimmune conditions.  Hepatitis C  Blood testing is recommended for: ? Everyone born from [birth year] through 1967. ? Anyone with known risk factors for hepatitis C.  Sexually transmitted infections (STIs)  You should be screened for sexually transmitted infections (STIs) including gonorrhea and chlamydia if: ? You are sexually active and are younger than 50 years of age. ? You are older than 49 years of age and your health care provider tells you that you are at risk for this type of infection. ? Your sexual activity has changed since you were last screened and you  are at an increased risk for chlamydia or gonorrhea. Ask your health care provider if you are at risk.  If you do not have HIV, but are at risk, it may be recommended that you take a prescription medicine daily to prevent HIV infection. This is called pre-exposure prophylaxis (PrEP). You are considered at risk if: ? You are sexually active and do not regularly use condoms or know the HIV status of your partner(s). ?  You take drugs by injection. ? You are sexually active with a partner who has HIV.  Talk with your health care provider about whether you are at high risk of being infected with HIV. If you choose to begin PrEP, you should first be tested for HIV. You should then be tested every 3 months for as long as you are taking PrEP. Pregnancy  If you are premenopausal and you may become pregnant, ask your health care provider about preconception counseling.  If you may become pregnant, take 400 to 800 micrograms (mcg) of folic acid every day.  If you want to prevent pregnancy, talk to your health care provider about birth control (contraception). Osteoporosis and menopause  Osteoporosis is a disease in which the bones lose minerals and strength with aging. This can result in serious bone fractures. Your risk for osteoporosis can be identified using a bone density scan.  If you are 14 years of age or older, or if you are at risk for osteoporosis and fractures, ask your health care provider if you should be screened.  Ask your health care provider whether you should take a calcium or vitamin D supplement to lower your risk for osteoporosis.  Menopause may have certain physical symptoms and risks.  Hormone replacement therapy may reduce some of these symptoms and risks. Talk to your health care provider about whether hormone replacement therapy is right for you. Follow these instructions at home:  Schedule regular health, dental, and eye exams.  Stay current with your  immunizations.  Do not use any tobacco products including cigarettes, chewing tobacco, or electronic cigarettes.  If you are pregnant, do not drink alcohol.  If you are breastfeeding, limit how much and how often you drink alcohol.  Limit alcohol intake to no more than 1 drink per day for nonpregnant women. One drink equals 12 ounces of beer, 5 ounces of wine, or 1 ounces of hard liquor.  Do not use street drugs.  Do not share needles.  Ask your health care provider for help if you need support or information about quitting drugs.  Tell your health care provider if you often feel depressed.  Tell your health care provider if you have ever been abused or do not feel safe at home. This information is not intended to replace advice given to you by your health care provider. Make sure you discuss any questions you have with your health care provider. Document Released: 10/23/2010 Document Revised: 09/15/2015 Document Reviewed: 01/11/2015 Elsevier Interactive Patient Education  [employer].

## 2017-12-03 NOTE — Assessment & Plan Note (Signed)
Trial of metformin

## 2017-12-03 NOTE — Assessment & Plan Note (Signed)
Trial of metformin. I have addressed  BMI and recommended wt loss of 10% of body weigh over the next 6 months using a low glycemic index diet and regular exercise a minimum of 5 days per week.

## 2017-12-03 NOTE — Assessment & Plan Note (Signed)
Annual comprehensive preventive exam was done as well as an evaluation and management of chronic conditions .  During the course of the visit the patient was educated and counseled about appropriate screening and preventive services including :  diabetes screening, lipid analysis with projected  10 year  risk for CAD , nutrition counseling, breast, cervical and colorectal cancer screening, and recommended immunizations.  Printed recommendations for health maintenance screenings was given 

## 2017-12-05 ENCOUNTER — Ambulatory Visit: Payer: BC Managed Care – PPO | Admitting: Family Medicine

## 2017-12-05 ENCOUNTER — Other Ambulatory Visit: Payer: Self-pay | Admitting: Family Medicine

## 2017-12-05 ENCOUNTER — Encounter: Payer: Self-pay | Admitting: Family Medicine

## 2017-12-05 VITALS — BP 112/74 | HR 84 | Temp 98.5°F | Ht 67.0 in | Wt 194.5 lb

## 2017-12-05 DIAGNOSIS — N760 Acute vaginitis: Secondary | ICD-10-CM | POA: Diagnosis not present

## 2017-12-05 DIAGNOSIS — R319 Hematuria, unspecified: Secondary | ICD-10-CM | POA: Diagnosis not present

## 2017-12-05 DIAGNOSIS — R3129 Other microscopic hematuria: Secondary | ICD-10-CM | POA: Insufficient documentation

## 2017-12-05 LAB — POC URINALSYSI DIPSTICK (AUTOMATED)
Bilirubin, UA: NEGATIVE
Glucose, UA: NEGATIVE
Ketones, UA: NEGATIVE
LEUKOCYTES UA: NEGATIVE
NITRITE UA: NEGATIVE
PH UA: 6 (ref 5.0–8.0)
PROTEIN UA: NEGATIVE
Spec Grav, UA: 1.01 (ref 1.010–1.025)
UROBILINOGEN UA: 0.2 U/dL

## 2017-12-05 MED ORDER — FLUCONAZOLE 150 MG PO TABS: 150.0000 mg | ORAL_TABLET | Freq: Once | ORAL | 0 refills | Status: AC

## 2017-12-05 NOTE — Addendum Note (Signed)
Addended by: Ria Bush on: 12/05/2017 01:47 PM   Modules accepted: Level of Service

## 2017-12-05 NOTE — Progress Notes (Addendum)
BP 112/74 (BP Location: Right Arm, Patient Position: Sitting, Cuff Size: Normal)   Pulse 84   Temp 98.5 F (36.9 C) (Oral)   Ht 5\' 7"  (1.702 m)   Wt 194 lb 8 oz (88.2 kg)   SpO2 96%   BMI 30.46 kg/m    CC: vaginal itching Subjective:    Patient ID: Gwendolyn [name], female    DOB: 1971/01/31, 47 y.o.   MRN: [id number]  HPI: Gwendolyn [name] is a 47 y.o. female presenting on 12/05/2017 for Vaginal Itching (C/o vaginal itching/burning. Sxs started 12/02/17. Started Flagyl but med is causing nausea and vaginal sxs are worsening. States she had same sxs 2 mos ago. )   3d h/o vaginal itching and burning. RLQ abdominal discomfort last night that is now better. No significant vaginal discharge. No urinary symptoms of dysuria, urgency, frequency, hematuria. No h/o kidney stones. No fevers/chills.   No new lotions, detergents, creams, or products.  Has started using new bedet.  No new sexual partners.   Started taking flagyl - not tolerating this well (nausea, dizziness) and symptoms are worsening.   H/o vaginal sxs 1 month ago not improved with monistat. Saw Dr Aundra Dubin dx with vaginitis treated with diflucan, terconazole, and flagyl. Wet prep negative at that time.   Saw PCP on Monday for physical.  S/p full colectomy for colonic inertia 2014.  [facility] s/p hysterectomy but ovaries and cervix remains- has not seen in last several years.   Relevant past medical, surgical, family and social history reviewed and updated as indicated. Interim medical history since our last visit reviewed. Allergies and medications reviewed and updated. Outpatient Medications Prior to Visit  Medication Sig Dispense Refill  . amphetamine-dextroamphetamine (ADDERALL) 30 MG tablet Take 30 mg by mouth 2 (two) times daily.   0  . diltiazem 2 % GEL Apply 1 application topically 2 (two) times daily. X 4-6 weeks 60 g 0  . DULoxetine (CYMBALTA) 60 MG capsule Take 60 mg by mouth daily.    Marland Kitchen  gabapentin (NEURONTIN) 800 MG tablet TAKE 1 TABLET (800 MG TOTAL) BY MOUTH NIGHTLY. 90 tablet 2  . Ginkgo Biloba 40 MG TABS Take by mouth.    . hydrocortisone (ANUSOL-HC) 25 MG suppository Place 25 mg rectally 2 (two) times daily as needed for hemorrhoids or anal itching.    . Melatonin 3 MG TABS Take 1 tablet by mouth at bedtime.    . mesalamine (CANASA) 1000 MG suppository Place 1 suppository (1,000 mg total) rectally at bedtime. 30 suppository 12  . metFORMIN (GLUCOPHAGE-XR) 500 MG 24 hr tablet Take 1 tablet (500 mg total) by mouth daily with breakfast. 90 tablet 0  . metroNIDAZOLE (FLAGYL) 500 MG tablet Take 1 tablet (500 mg total) by mouth 2 (two) times daily. 14 tablet 0  . tiaGABine (GABITRIL) 4 MG tablet Takes 4mg  in the AM and 12mg  at bedtime    . zolpidem (AMBIEN) 10 MG tablet Take 1 tablet (10 mg total) by mouth at bedtime as needed for sleep. 30 tablet 5  . fluconazole (DIFLUCAN) 150 MG tablet Take 1 tablet (150 mg total) by mouth every 3 (three) days. 3 tablet 0  . terconazole (TERAZOL 3) 0.8 % vaginal cream Place 1 applicator vaginally at bedtime. X 3 days 20 g 0   Facility-Administered Medications Prior to Visit  Medication Dose Route Frequency Provider Last Rate Last Dose  . 0.9 %  sodium chloride infusion  500 mL Intravenous Once Pyrtle, Lajuan Lines, MD  Per HPI unless specifically indicated in ROS section below Review of Systems     Objective:    BP 112/74 (BP Location: Right Arm, Patient Position: Sitting, Cuff Size: Normal)   Pulse 84   Temp 98.5 F (36.9 C) (Oral)   Ht 5\' 7"  (1.702 m)   Wt 194 lb 8 oz (88.2 kg)   SpO2 96%   BMI 30.46 kg/m   Wt Readings from Last 3 Encounters:  12/05/17 194 lb 8 oz (88.2 kg)  12/02/17 195 lb (88.5 kg)  10/22/17 196 lb 3.2 oz (89 kg)    Physical Exam  Constitutional: She appears well-developed and well-nourished. No distress.  Abdominal: Soft. Bowel sounds are normal. She exhibits no distension and no mass. There is no  hepatosplenomegaly. There is tenderness (mild) in the right upper quadrant. There is no rebound, no guarding and no CVA tenderness. No hernia.  Genitourinary: Pelvic exam was performed with patient supine. There is no rash, tenderness, lesion or injury on the right labia. There is no rash, tenderness, lesion or injury on the left labia. Cervix exhibits friability (mild). Cervix exhibits no motion tenderness and no discharge. Right adnexum displays no mass and no tenderness. Left adnexum displays no mass and no tenderness. There is erythema (mild) in the vagina. No tenderness in the vagina. No signs of injury around the vagina. Vaginal discharge found.  Genitourinary Comments:  No vulvar rash/dermatitis appreciated White discharge present in vaginal vault Uterus absent Mildly friable cervix present without discharge or bleeding  Nursing note and vitals reviewed.  Results for orders placed or performed in visit on 12/05/17  POCT Urinalysis Dipstick (Automated)  Result Value Ref Range   Color, UA straw    Clarity, UA clear    Glucose, UA Negative Negative   Bilirubin, UA negative    Ketones, UA negative    Spec Grav, UA 1.010 1.010 - 1.025   Blood, UA 2+    pH, UA 6.0 5.0 - 8.0   Protein, UA Negative Negative   Urobilinogen, UA 0.2 0.2 or 1.0 E.U./dL   Nitrite, UA negative    Leukocytes, UA Negative Negative      Assessment & Plan:   Problem List Items Addressed This Visit    Vaginitis - Primary    No evidence of vulvitis.  New vaginitis (itching, burning) and exam suspicious for yeast infection - will treat with diflucan 150mg  x2 while we await wet prep.  She did have somewhat irritated appearing cervix but without discharge. If negative wet prep or recurrent/persistent sxs, would suggest GYN eval as next step. Pt agrees with plan.       Relevant Orders   POCT Urinalysis Dipstick (Automated) (Completed)   Hematuria    Initial UA with LE and blood - 10-20 RBC/hpf and many  epithelial cells likely contaminant. I had patient recollect urine specimen - rare epithelial cells, persistent blood in UA with 3-5 RBC/hpf. Will discuss possible uro referral for patient. Story/exam not consistent with kidney stone.       Relevant Orders   POCT Urinalysis Dipstick (Automated) (Completed)       Meds ordered this encounter  Medications  . fluconazole (DIFLUCAN) 150 MG tablet    Sig: Take 1 tablet (150 mg total) by mouth once for 1 dose. Repeat in 4 days if needed    Dispense:  2 tablet    Refill:  0   Orders Placed This Encounter  Procedures  . POCT Urinalysis Dipstick (Automated)  Follow up plan: No follow-ups on file.  Ria Bush, MD

## 2017-12-05 NOTE — Patient Instructions (Signed)
For blood in urine - recheck urine today.  Possible yeast infection - treat with diflucan sent to pharmacy.  If ongoing, let us know for referral to GYN.  We will be in touch with wet prep from today as well as repeat urinalysis.

## 2017-12-05 NOTE — Assessment & Plan Note (Signed)
Initial UA with LE and blood - 10-20 RBC/hpf and many epithelial cells likely contaminant. I had patient recollect urine specimen - rare epithelial cells, persistent blood in UA with 3-5 RBC/hpf. Will discuss possible uro referral for patient. Story/exam not consistent with kidney stone.

## 2017-12-05 NOTE — Assessment & Plan Note (Addendum)
No evidence of vulvitis.  New vaginitis (itching, burning) and exam suspicious for yeast infection - will treat with diflucan 150mg  x2 while we await wet prep.  She did have somewhat irritated appearing cervix but without discharge. If negative wet prep or recurrent/persistent sxs, would suggest GYN eval as next step. Pt agrees with plan.

## 2017-12-05 NOTE — Addendum Note (Signed)
Addended by: Lendon Collar on: 12/05/2017 02:19 PM   Modules accepted: Orders

## 2017-12-06 LAB — WET PREP BY MOLECULAR PROBE
CANDIDA SPECIES: NOT DETECTED
MICRO NUMBER:: 90971783
SPECIMEN QUALITY:: ADEQUATE
TRICHOMONAS VAG: NOT DETECTED

## 2017-12-07 ENCOUNTER — Other Ambulatory Visit: Payer: Self-pay | Admitting: Family Medicine

## 2017-12-07 MED ORDER — METRONIDAZOLE 0.75 % VA GEL: 1.0000 | Freq: Two times a day (BID) | VAGINAL | 0 refills | Status: AC

## 2017-12-08 NOTE — Telephone Encounter (Signed)
noted 

## 2017-12-09 ENCOUNTER — Other Ambulatory Visit: Payer: Self-pay | Admitting: Chiropractic Medicine

## 2017-12-09 DIAGNOSIS — M5413 Radiculopathy, cervicothoracic region: Secondary | ICD-10-CM

## 2017-12-18 ENCOUNTER — Other Ambulatory Visit: Payer: Self-pay | Admitting: Internal Medicine

## 2017-12-18 DIAGNOSIS — M797 Fibromyalgia: Secondary | ICD-10-CM

## 2017-12-24 ENCOUNTER — Other Ambulatory Visit: Payer: Self-pay | Admitting: Internal Medicine

## 2017-12-24 DIAGNOSIS — M542 Cervicalgia: Secondary | ICD-10-CM

## 2017-12-31 ENCOUNTER — Encounter: Payer: Self-pay | Admitting: Urology

## 2017-12-31 ENCOUNTER — Ambulatory Visit: Payer: BC Managed Care – PPO | Admitting: Urology

## 2017-12-31 VITALS — BP 101/67 | HR 89 | Ht 67.0 in | Wt 195.0 lb

## 2017-12-31 DIAGNOSIS — R3129 Other microscopic hematuria: Secondary | ICD-10-CM

## 2017-12-31 MED ORDER — DIAZEPAM 10 MG PO TABS: ORAL_TABLET | ORAL | 0 refills | Status: DC

## 2017-12-31 NOTE — Progress Notes (Addendum)
12/31/2017 1:27 PM   Gwendolyn [name] 10/04/1970 [id number]  Referring provider: [name], [name], [place] [address]  Chief Complaint  Patient presents with  . Hematuria    New Patient    HPI: Patient is a 47 -year-old Caucasian female who presents today as a referral from [name] for microscopic hematuria.    She has been having right upper quadrant pain on and off for the last two months.  It is not painful unless touched.    Patient was found to have microscopic hematuria on 12/05/2017 (see Dr. [name] note)  with 3-5 RBC's/hpf.  Patient doesn't have a prior history of microscopic hematuria.    She does not have a prior history of recurrent urinary tract infections, nephrolithiasis, trauma to the genitourinary tract or malignancies of the genitourinary tract.   She has a paternal family history of stones, but there is no history of malignancies of the genitourinary tract or hematuria.   Today, she is having baseline nocturia x 1-2.  Patient denies any gross hematuria, dysuria or suprapubic/flank pain.  Patient denies any fevers, chills or vomiting.    She has had episodes of dizziness and nausea with unknown cause.    She is a former smoker.  She quit in 2004.  She was a pad a day smoker.  She is not exposed to secondhand smoke.  She is a Public relations account executive.   She has a high BMI.     PMH: Past Medical History:  Diagnosis Date  . Anal fissure   . Anxiety   . Breast cancer ([place])   . Depression   . Fibromyalgia   . Gallstones   . Irritable bowel syndrome   . Yeast vaginitis     Surgical History: Past Surgical History:  Procedure Laterality Date  . BREAST LUMPECTOMY Right 2003   Radiatiomn Therapy  . CHOLECYSTECTOMY  2000  . COLECTOMY  10/31/2012   SECONDARY TO COLONIC INERTIA  . SUPRACERVICAL ABDOMINAL HYSTERECTOMY  04/10/2011   But still has ovaries.    Home Medications:  Allergies as of  12/31/2017      Reactions   Amoxicillin-pot Clavulanate    Other reaction(s): NAUSEA (Augmentin)   Codeine Other (See Comments)   hallucinations   Morphine Other (See Comments)   Penicillins    nausea, diarrhea      Medication List        Accurate as of 12/31/17  1:27 PM. Always use your most recent med list.          amphetamine-dextroamphetamine 30 MG tablet Commonly known as:  ADDERALL Take 30 mg by mouth 2 (two) times daily.   diltiazem 2 % Gel Apply 1 application topically 2 (two) times daily. X 4-6 weeks   DULoxetine 60 MG capsule Commonly known as:  CYMBALTA Take 60 mg by mouth daily.   gabapentin 800 MG tablet Commonly known as:  NEURONTIN TAKE 1 TABLET (800 MG TOTAL) BY MOUTH NIGHTLY.   Ginkgo Biloba 40 MG Tabs Take by mouth.   hydrocortisone 25 MG suppository Commonly known as:  ANUSOL-HC Place 25 mg rectally 2 (two) times daily as needed for hemorrhoids or anal itching.   Melatonin 3 MG Tabs Take 1 tablet by mouth at bedtime.   mesalamine 1000 MG suppository Commonly known as:  CANASA Place 1 suppository (1,000 mg total) rectally at bedtime.   metFORMIN 500 MG 24 hr tablet Commonly known as:  GLUCOPHAGE-XR Take 1 tablet (  500 mg total) by mouth daily with breakfast.   tiaGABine 4 MG tablet Commonly known as:  GABITRIL Takes 4mg  in the AM and 12mg  at bedtime   zolpidem 10 MG tablet Commonly known as:  AMBIEN Take 1 tablet (10 mg total) by mouth at bedtime as needed for sleep.       Allergies:  Allergies  Allergen Reactions  . Amoxicillin-Pot Clavulanate     Other reaction(s): NAUSEA (Augmentin)  . Codeine Other (See Comments)    hallucinations  . Morphine Other (See Comments)  . Penicillins     nausea, diarrhea    Family History: Family History  Problem Relation Age of Onset  . Meniere's disease Father   . Alcohol abuse Sister   . Heart disease Maternal Grandmother   . Rectal cancer Maternal Grandmother   . Heart disease  Maternal Grandfather   . Heart disease Paternal Grandmother   . Heart disease Paternal Grandfather   . Rectal cancer Other        mat great gm  . Colon cancer Neg Hx   . Esophageal cancer Neg Hx     Social History:  reports that she quit smoking about 15 years ago. She quit after 20.00 years of use. She has never used smokeless tobacco. She reports that she drinks alcohol. She reports that she does not use drugs.  ROS: UROLOGY Frequent Urination?: No Hard to postpone urination?: No Burning/pain with urination?: No Get up at night to urinate?: Yes Leakage of urine?: No Urine stream starts and stops?: No Trouble starting stream?: No Do you have to strain to urinate?: No Blood in urine?: Yes Urinary tract infection?: No Sexually transmitted disease?: No Injury to kidneys or bladder?: No Painful intercourse?: No Weak stream?: No Currently pregnant?: No Vaginal bleeding?: No Last menstrual period?: 2014  Gastrointestinal Nausea?: Yes Vomiting?: No Indigestion/heartburn?: No Diarrhea?: No Constipation?: No  Constitutional Fever: No Night sweats?: No Weight loss?: No Fatigue?: No  Skin Skin rash/lesions?: No Itching?: No  Eyes Blurred vision?: No Double vision?: No  Ears/Nose/Throat Sore throat?: No Sinus problems?: No  Hematologic/Lymphatic Swollen glands?: No Easy bruising?: No  Cardiovascular Leg swelling?: No Chest pain?: No  Respiratory Cough?: No Shortness of breath?: No  Endocrine Excessive thirst?: No  Musculoskeletal Back pain?: No Joint pain?: No  Neurological Headaches?: Yes Dizziness?: Yes  Psychologic Depression?: Yes Anxiety?: Yes  Physical Exam: BP 101/67   Pulse 89   Ht 5\' 7"  (1.702 m)   Wt 195 lb (88.5 kg)   BMI 30.54 kg/m   Constitutional:  Well nourished. Alert and oriented, No acute distress. HEENT: [place] AT, moist mucus membranes.  Trachea midline, no masses. Cardiovascular: No clubbing, cyanosis, or  edema. Respiratory: Normal respiratory effort, no increased work of breathing. GI: Abdomen is soft, non tender, non distended, no abdominal masses. Liver and spleen not palpable.  No hernias appreciated.  Stool sample for occult testing is not indicated.   GU: No CVA tenderness.  No bladder fullness or masses.   Skin: No rashes, bruises or suspicious lesions. Lymph: No cervical or inguinal adenopathy. Neurologic: Grossly intact, no focal deficits, moving all 4 extremities. Psychiatric: Normal mood and affect.  Laboratory Data: Lab Results  Component Value Date   WBC 6.4 10/02/2017   HGB 14.1 10/02/2017   HCT 42.7 10/02/2017   MCV 92 10/02/2017   PLT 318 10/02/2017    Lab Results  Component Value Date   CREATININE 0.83 10/02/2017    No results found  for: PSA  No results found for: TESTOSTERONE  Lab Results  Component Value Date   HGBA1C 5.7 07/22/2015    Lab Results  Component Value Date   TSH 1.350 11/01/2016       Component Value Date/Time   CHOL 210 (H) 10/02/2017 0718   HDL 45 10/02/2017 0718   CHOLHDL 4.7 (H) 10/02/2017 0718   LDLCALC 127 (H) 10/02/2017 0718    Lab Results  Component Value Date   AST 14 10/02/2017   Lab Results  Component Value Date   ALT 11 10/02/2017   No components found for: ALKALINEPHOPHATASE No components found for: BILIRUBINTOTAL  Lab Results  Component Value Date   ESTRADIOL 52 06/14/2015     Urinalysis 11-30 RBC's and many bacteria.  See Epic.   I have reviewed the labs.  Assessment & Plan:   1. MIcroscopic hematuria Explained to the patient that there are a number of causes that can be associated with blood in the urine, such as stones, BPH, UTI's, damage to the urinary tract and/or cancer. At this time, I felt that the patient warranted further urologic evaluation.   The AUA guidelines state that a CT urogram is the preferred imaging study to evaluate hematuria. I explained to the patient that a contrast material  will be injected into a vein and that in rare instances, an allergic reaction can result and may even life threatening   The patient denies any allergies to contrast, iodine and/or seafood and is  taking metformin. Her reproductive status is hysterectomy Following the imaging study,  I've recommended a cystoscopy. I described how this is performed, typically in an office setting with a flexible cystoscope. We described the risks, benefits, and possible side effects, the most common of which is a minor amount of blood in the urine and/or burning which usually resolves in 24 to 48 hours.  The patient had the opportunity to ask questions which were answered. Based upon this discussion, the patient is willing to proceed. Therefore, I've ordered: a CT Urogram and cystoscopy.  The patient will return following all of the above for discussion of the results.  - Urinalysis, Complete - CULTURE, URINE COMPREHENSIVE - BUN+Creat Rx given for Valium 10 mg - advised to have a driver   Return for CT Urogram report and cystoscopy.  These notes generated with voice recognition software. I apologize for typographical errors.  Zara Council, PA-C  [facility] [address] [place]  [place], [place] [address] [phone]

## 2017-12-31 NOTE — Addendum Note (Signed)
Addended by: Zara Council A on: 12/31/2017 02:09 PM   Modules accepted: Orders

## 2018-01-01 LAB — URINALYSIS, COMPLETE
Bilirubin, UA: NEGATIVE
GLUCOSE, UA: NEGATIVE
Ketones, UA: NEGATIVE
Leukocytes, UA: NEGATIVE
NITRITE UA: NEGATIVE
Specific Gravity, UA: >1.03 — ABNORMAL HIGH (ref 1.005–1.030)
Urobilinogen, Ur: 0.2 mg/dL (ref 0.2–1.0)
pH, UA: 5.5 (ref 5.0–7.5)

## 2018-01-01 LAB — MICROSCOPIC EXAMINATION: WBC UA: NONE SEEN /HPF (ref 0–5)

## 2018-01-01 LAB — BUN+CREAT
BUN / CREAT RATIO: 15 (ref 9–23)
BUN: 15 mg/dL (ref 6–24)
CREATININE: 0.97 mg/dL (ref 0.57–1.00)
GFR calc Af Amer: 80 mL/min/{1.73_m2} (ref >59)
GFR, EST NON AFRICAN AMERICAN: 69 mL/min/{1.73_m2} (ref >59)

## 2018-01-03 LAB — CULTURE, URINE COMPREHENSIVE

## 2018-01-17 ENCOUNTER — Encounter: Payer: Self-pay | Admitting: Internal Medicine

## 2018-01-24 ENCOUNTER — Ambulatory Visit
Admission: RE | Admit: 2018-01-24 | Discharge: 2018-01-24 | Disposition: A | Discharge status: Home / Self Care | Payer: BC Managed Care – PPO | Source: Ambulatory Visit | Attending: Urology | Admitting: Urology

## 2018-01-24 DIAGNOSIS — Z9049 Acquired absence of other specified parts of digestive tract: Secondary | ICD-10-CM | POA: Insufficient documentation

## 2018-01-24 DIAGNOSIS — K769 Liver disease, unspecified: Secondary | ICD-10-CM | POA: Insufficient documentation

## 2018-01-24 DIAGNOSIS — R3129 Other microscopic hematuria: Secondary | ICD-10-CM

## 2018-01-24 MED ORDER — IOPAMIDOL (ISOVUE-370) INJECTION 76%
125.0000 mL | Freq: Once | INTRAVENOUS | Status: AC | PRN
  Administered 2018-01-24: 125 mL via INTRAVENOUS

## 2018-02-03 ENCOUNTER — Ambulatory Visit: Payer: BC Managed Care – PPO | Admitting: Urology

## 2018-02-03 ENCOUNTER — Encounter: Payer: Self-pay | Admitting: Urology

## 2018-02-03 VITALS — BP 135/83 | HR 120 | Ht 67.0 in | Wt 186.4 lb

## 2018-02-03 DIAGNOSIS — R3129 Other microscopic hematuria: Secondary | ICD-10-CM | POA: Diagnosis not present

## 2018-02-03 LAB — MICROSCOPIC EXAMINATION: WBC, UA: NONE SEEN /hpf (ref 0–5)

## 2018-02-03 LAB — URINALYSIS, COMPLETE
Bilirubin, UA: NEGATIVE
Glucose, UA: NEGATIVE
Ketones, UA: NEGATIVE
LEUKOCYTES UA: NEGATIVE
Nitrite, UA: NEGATIVE
PH UA: 6 (ref 5.0–7.5)
Protein, UA: NEGATIVE
Specific Gravity, UA: 1.025 (ref 1.005–1.030)
Urobilinogen, Ur: 0.2 mg/dL (ref 0.2–1.0)

## 2018-02-03 MED ORDER — LIDOCAINE HCL URETHRAL/MUCOSAL 2 % EX GEL
1.0000 "application " | Freq: Once | CUTANEOUS | Status: AC
  Administered 2018-02-03: 1 via URETHRAL

## 2018-02-03 NOTE — Progress Notes (Signed)
Cystoscopy Procedure Note:  Indication: Microscopic hematuria  After informed consent and discussion of the procedure and its risks, Gwendolyn [name] was positioned and prepped in the standard fashion. Cystoscopy was performed with a flexible cystoscope. The urethra, bladder neck and entire bladder was visualized in a standard fashion.  The ureteral orifices were visualized in their normal location and orientation.  The bladder mucosa was grossly normal throughout.  Imaging: CT urogram dated 01/24/2018 personally reviewed.  No urolithiasis, hydronephrosis, renal masses, or filling defects.  Findings: Normal cystoscopy  Assessment and Plan: Referral to nephrology for proteinuria Follow-up with urology as needed  Nickolas Madrid, MD 02/03/2018

## 2018-03-03 ENCOUNTER — Other Ambulatory Visit: Payer: Self-pay | Admitting: Internal Medicine

## 2018-03-06 ENCOUNTER — Encounter: Payer: Self-pay | Admitting: Family Medicine

## 2018-03-06 ENCOUNTER — Ambulatory Visit: Payer: BC Managed Care – PPO | Admitting: Family Medicine

## 2018-03-06 ENCOUNTER — Other Ambulatory Visit (HOSPITAL_COMMUNITY)
Admission: RE | Admit: 2018-03-06 | Discharge: 2018-03-06 | Disposition: A | Discharge status: Home / Self Care | Payer: BC Managed Care – PPO | Source: Ambulatory Visit | Attending: Family Medicine | Admitting: Family Medicine

## 2018-03-06 VITALS — BP 110/68 | HR 96 | Temp 98.2°F | Ht 67.0 in | Wt 194.2 lb

## 2018-03-06 DIAGNOSIS — Z113 Encounter for screening for infections with a predominantly sexual mode of transmission: Secondary | ICD-10-CM | POA: Diagnosis not present

## 2018-03-06 DIAGNOSIS — B3731 Acute candidiasis of vulva and vagina: Secondary | ICD-10-CM

## 2018-03-06 DIAGNOSIS — R1011 Right upper quadrant pain: Secondary | ICD-10-CM | POA: Diagnosis not present

## 2018-03-06 DIAGNOSIS — N3001 Acute cystitis with hematuria: Secondary | ICD-10-CM

## 2018-03-06 DIAGNOSIS — B373 Candidiasis of vulva and vagina: Secondary | ICD-10-CM

## 2018-03-06 DIAGNOSIS — R350 Frequency of micturition: Secondary | ICD-10-CM | POA: Diagnosis not present

## 2018-03-06 LAB — POCT URINALYSIS DIPSTICK
Bilirubin, UA: NEGATIVE
GLUCOSE UA: NEGATIVE
Ketones, UA: NEGATIVE
Nitrite, UA: NEGATIVE
Protein, UA: NEGATIVE
Spec Grav, UA: 1.02 (ref 1.010–1.025)
Urobilinogen, UA: 0.2 E.U./dL
pH, UA: 6.5 (ref 5.0–8.0)

## 2018-03-06 MED ORDER — CIPROFLOXACIN HCL 500 MG PO TABS: 500.0000 mg | ORAL_TABLET | Freq: Two times a day (BID) | ORAL | 0 refills | Status: AC

## 2018-03-06 MED ORDER — FLUCONAZOLE 150 MG PO TABS: 150.0000 mg | ORAL_TABLET | Freq: Once | ORAL | 0 refills | Status: AC

## 2018-03-06 NOTE — Progress Notes (Signed)
Subjective:    Patient ID: Gwendolyn [name], female    DOB: 05-18-1970, 47 y.o.   MRN: [id number]  HPI  Presents to clinic c/o UTI symptoms for 1-2 days.  Patient complains of urinary frequency, burning with urination.  She is been trying to drink increased water. She has had history of blood in urine for a few months, even without UTI symptoms.  Patient states she has seen urology and also will be seeing nephrology for further investigation into why she has blood in urine.  Patient also reports right upper quadrant tenderness for past few months, throughout the process of working up blood in urine she had a CT scan of abdomen pelvis (01/2018) which did not reveal any acute abnormalities -there was a 0.6 x 0.4 hypodense lesion seen in the liver on CT scan, but radiologist states it is technically too small to characterize and statistically is likely to be benign.  Patient also states she has a new sexual partner, and would like STD testing.  Patient Active Problem List   Diagnosis Date Noted  . Hematuria 12/05/2017  . Rectal bleeding 09/02/2017  . Encounter for preventive health examination 11/22/2016  . History of abdominal supracervical subtotal hysterectomy 11/22/2016  . Achilles tendinitis of both lower extremities 11/10/2015  . Plantar fasciitis 11/10/2015  . Obesity (BMI 30.0-34.9) 07/23/2015  . Vaginitis 03/18/2015  . Menopause 01/18/2015  . Hypercholesteremia 10/05/2014  . Vitamin D deficiency 10/05/2014  . Depressive disorder 10/05/2014  . History of breast cancer 10/05/2014  . Migraine without aura with status migrainosus 10/05/2014  . 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 Insomnia 10/05/2014  . Bipolar 2 disorder ([place]) 10/05/2014  . IBS (irritable bowel syndrome) 10/05/2014  . Obstructive sleep apnea 10/05/2014  . S/P colectomy 10/05/2014  . Hypersomnia 10/05/2014  . Anxiety 10/05/2014  . Fibromyalgia 10/05/2014  . 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 Smoking status: Former Smoker    Years: 20.00    Last attempt to quit: 04/23/2002    Years since quitting: 15.8  . Smokeless tobacco: Never Used  . Tobacco comment: QUIT IN 2004  Substance Use Topics  . Alcohol use: Yes    Alcohol/week: 0.0 standard drinks    Comment: OCCASIONALLY   Review of Systems  Constitutional: Negative for chills, fatigue and fever.  HENT: Negative for congestion, ear pain, sinus pain and sore throat.   Eyes: Negative.   Respiratory: Negative for cough, shortness of breath and wheezing.   Cardiovascular: Negative for chest pain, palpitations and leg swelling.  Gastrointestinal: Negative for abdominal pain, diarrhea, nausea and vomiting.  Genitourinary: +dysuria, frequency and urgency.  Musculoskeletal: Negative for arthralgias and myalgias.  Skin: Negative for color change, pallor and rash.  Neurological: Negative for syncope, light-headedness and headaches.  Psychiatric/Behavioral: The patient is not nervous/anxious.       Objective:   Physical Exam  Constitutional: She is oriented to person, place, and time. She appears well-nourished. No distress.  HENT:  Head: Normocephalic and atraumatic.  Eyes: Conjunctivae and EOM are normal. No scleral icterus.  Neck: No tracheal deviation present.  Cardiovascular: Normal rate and regular rhythm.  Pulmonary/Chest: Effort normal and breath sounds normal. No respiratory distress.  Abdominal: Soft. Bowel sounds are normal. She exhibits no distension. There is tenderness (RUQ). There is no rebound and no guarding.  Musculoskeletal: She exhibits no edema.  Neurological: She is alert and oriented to person, place, and time.  Skin: Skin is warm and dry.  Psychiatric: She has a normal mood and affect. Her behavior is normal.  Nursing note and vitals reviewed.     Vitals:   03/06/18 1433  BP: 110/68  Pulse: 96  Temp: 98.2 F (36.8 C)  SpO2: 97%    Assessment & Plan:   Acute cystitis with hematuria/urinary frequency-patient will take Cipro 500 mg twice daily for 5 days.  Advised to increase water intake, avoid excess sugar and caffeine.  Advised to always urinate after sex.  STD screening-we will test for gonorrhea, chlamydia, trichomonas, syphilis, HIV.  Right upper quadrant pain- CT scan done in October unremarkable.  We will do hepatitis panel and blood work and swell as CBC and CMP.  Vaginal Candida- patient usually gets yeast infection while taking antibiotic, Diflucan sent to combat this.  Keep regularly scheduled follow-up with PCP as planned, return to clinic sooner if any issues arise.

## 2018-03-07 LAB — HEPATITIS PANEL, ACUTE
HEP B C IGM: NONREACTIVE
Hep A IgM: NONREACTIVE
Hepatitis B Surface Ag: NONREACTIVE
Hepatitis C Ab: NONREACTIVE
SIGNAL TO CUT-OFF: 0.02 (ref <1.00)

## 2018-03-07 LAB — COMPREHENSIVE METABOLIC PANEL
ALBUMIN: 4.2 g/dL (ref 3.5–5.2)
ALK PHOS: 33 U/L — AB (ref 39–117)
ALT: 14 U/L (ref 0–35)
AST: 16 U/L (ref 0–37)
BUN: 13 mg/dL (ref 6–23)
CO2: 32 mEq/L (ref 19–32)
Calcium: 9.5 mg/dL (ref 8.4–10.5)
Chloride: 101 mEq/L (ref 96–112)
Creatinine, Ser: 0.95 mg/dL (ref 0.40–1.20)
GFR: 66.48 mL/min (ref >60.00)
Glucose, Bld: 108 mg/dL — ABNORMAL HIGH (ref 70–99)
POTASSIUM: 4.1 meq/L (ref 3.5–5.1)
Sodium: 140 mEq/L (ref 135–145)
Total Bilirubin: 0.3 mg/dL (ref 0.2–1.2)
Total Protein: 6.9 g/dL (ref 6.0–8.3)

## 2018-03-07 LAB — RPR: RPR Ser Ql: NONREACTIVE

## 2018-03-07 LAB — URINE CULTURE
MICRO NUMBER: 91373142
Result:: NO GROWTH
SPECIMEN QUALITY: ADEQUATE

## 2018-03-07 LAB — CBC
HEMATOCRIT: 41.1 % (ref 36.0–46.0)
HEMOGLOBIN: 13.8 g/dL (ref 12.0–15.0)
MCHC: 33.5 g/dL (ref 30.0–36.0)
MCV: 92.3 fl (ref 78.0–100.0)
PLATELETS: 300 10*3/uL (ref 150.0–400.0)
RBC: 4.46 Mil/uL (ref 3.87–5.11)
RDW: 14.2 % (ref 11.5–15.5)
WBC: 9.5 10*3/uL (ref 4.0–10.5)

## 2018-03-07 LAB — HIV ANTIBODY (ROUTINE TESTING W REFLEX): HIV: NONREACTIVE

## 2018-03-10 LAB — URINE CYTOLOGY ANCILLARY ONLY
CHLAMYDIA, DNA PROBE: NEGATIVE
NEISSERIA GONORRHEA: NEGATIVE
Trichomonas: NEGATIVE

## 2018-03-12 LAB — URINE CYTOLOGY ANCILLARY ONLY: Bacterial vaginitis: NEGATIVE

## 2018-03-17 ENCOUNTER — Other Ambulatory Visit: Payer: Self-pay | Admitting: Nephrology

## 2018-03-17 ENCOUNTER — Other Ambulatory Visit: Payer: Self-pay | Admitting: Emergency Medicine

## 2018-03-17 DIAGNOSIS — R809 Proteinuria, unspecified: Secondary | ICD-10-CM

## 2018-03-24 ENCOUNTER — Ambulatory Visit
Admission: RE | Admit: 2018-03-24 | Discharge: 2018-03-24 | Disposition: A | Discharge status: Home / Self Care | Payer: BC Managed Care – PPO | Source: Ambulatory Visit | Attending: Emergency Medicine | Admitting: Emergency Medicine

## 2018-03-24 DIAGNOSIS — R809 Proteinuria, unspecified: Secondary | ICD-10-CM | POA: Diagnosis present

## 2018-03-25 NOTE — Telephone Encounter (Signed)
LMTCB. Please transfer pt to pur office.

## 2018-03-26 ENCOUNTER — Encounter
Admission: RE | Admit: 2018-03-26 | Discharge: 2018-03-26 | Disposition: A | Discharge status: Home / Self Care | Payer: BC Managed Care – PPO | Source: Ambulatory Visit | Attending: Nephrology | Admitting: Nephrology

## 2018-03-26 DIAGNOSIS — Z01812 Encounter for preprocedural laboratory examination: Secondary | ICD-10-CM | POA: Insufficient documentation

## 2018-03-26 LAB — URINALYSIS, ROUTINE W REFLEX MICROSCOPIC
BACTERIA UA: NONE SEEN
Bilirubin Urine: NEGATIVE
Glucose, UA: NEGATIVE mg/dL
Ketones, ur: NEGATIVE mg/dL
Leukocytes, UA: NEGATIVE
Nitrite: NEGATIVE
Protein, ur: NEGATIVE mg/dL
RBC / HPF: >50 RBC/hpf — ABNORMAL HIGH (ref 0–5)
Specific Gravity, Urine: 1.021 (ref 1.005–1.030)
pH: 5 (ref 5.0–8.0)

## 2018-03-26 LAB — COMPREHENSIVE METABOLIC PANEL
ALT: 14 U/L (ref 0–44)
ANION GAP: 9 (ref 5–15)
AST: 19 U/L (ref 15–41)
Albumin: 3.8 g/dL (ref 3.5–5.0)
Alkaline Phosphatase: 31 U/L — ABNORMAL LOW (ref 38–126)
BUN: 15 mg/dL (ref 6–20)
CHLORIDE: 105 mmol/L (ref 98–111)
CO2: 27 mmol/L (ref 22–32)
Calcium: 8.8 mg/dL — ABNORMAL LOW (ref 8.9–10.3)
Creatinine, Ser: 0.87 mg/dL (ref 0.44–1.00)
GFR calc Af Amer: >60 mL/min (ref >60)
GFR calc non Af Amer: >60 mL/min (ref >60)
Glucose, Bld: 100 mg/dL — ABNORMAL HIGH (ref 70–99)
POTASSIUM: 4 mmol/L (ref 3.5–5.1)
Sodium: 141 mmol/L (ref 135–145)
Total Bilirubin: 0.4 mg/dL (ref 0.3–1.2)
Total Protein: 6.8 g/dL (ref 6.5–8.1)

## 2018-03-26 LAB — CBC WITH DIFFERENTIAL/PLATELET
Abs Immature Granulocytes: 0.02 10*3/uL (ref 0.00–0.07)
Basophils Absolute: 0 10*3/uL (ref 0.0–0.1)
Basophils Relative: 0 %
EOS ABS: 0.1 10*3/uL (ref 0.0–0.5)
Eosinophils Relative: 1 %
HCT: 39.1 % (ref 36.0–46.0)
Hemoglobin: 12.9 g/dL (ref 12.0–15.0)
Immature Granulocytes: 0 %
Lymphocytes Relative: 21 %
Lymphs Abs: 1.7 10*3/uL (ref 0.7–4.0)
MCH: 30.9 pg (ref 26.0–34.0)
MCHC: 33 g/dL (ref 30.0–36.0)
MCV: 93.5 fL (ref 80.0–100.0)
Monocytes Absolute: 0.5 10*3/uL (ref 0.1–1.0)
Monocytes Relative: 6 %
Neutro Abs: 5.7 10*3/uL (ref 1.7–7.7)
Neutrophils Relative %: 72 %
Platelets: 303 10*3/uL (ref 150–400)
RBC: 4.18 MIL/uL (ref 3.87–5.11)
RDW: 13.3 % (ref 11.5–15.5)
WBC: 8 10*3/uL (ref 4.0–10.5)
nRBC: 0 % (ref 0.0–0.2)

## 2018-03-26 LAB — PROTIME-INR
INR: 0.85
PROTHROMBIN TIME: 11.6 s (ref 11.4–15.2)

## 2018-03-26 LAB — TYPE AND SCREEN
ABO/RH(D): A POS
ANTIBODY SCREEN: NEGATIVE

## 2018-03-26 LAB — PROTEIN / CREATININE RATIO, URINE
Creatinine, Urine: 134 mg/dL
PROTEIN CREATININE RATIO: 0.05 mg/mg{creat} (ref 0.00–0.15)
Total Protein, Urine: 7 mg/dL

## 2018-03-26 LAB — APTT: aPTT: 27 seconds (ref 24–36)

## 2018-03-28 ENCOUNTER — Observation Stay
Admission: RE | Admit: 2018-03-28 | Discharge: 2018-03-29 | Disposition: A | Discharge status: Home / Self Care | Payer: BC Managed Care – PPO | Source: Ambulatory Visit | Attending: Nephrology | Admitting: Nephrology

## 2018-03-28 ENCOUNTER — Observation Stay: Payer: BC Managed Care – PPO

## 2018-03-28 ENCOUNTER — Other Ambulatory Visit: Payer: Self-pay

## 2018-03-28 DIAGNOSIS — R3129 Other microscopic hematuria: Principal | ICD-10-CM | POA: Insufficient documentation

## 2018-03-28 DIAGNOSIS — R809 Proteinuria, unspecified: Secondary | ICD-10-CM | POA: Diagnosis present

## 2018-03-28 DIAGNOSIS — Z23 Encounter for immunization: Secondary | ICD-10-CM | POA: Diagnosis not present

## 2018-03-28 LAB — CBC
HCT: 41.7 % (ref 36.0–46.0)
Hemoglobin: 13.3 g/dL (ref 12.0–15.0)
MCH: 30.1 pg (ref 26.0–34.0)
MCHC: 31.9 g/dL (ref 30.0–36.0)
MCV: 94.3 fL (ref 80.0–100.0)
PLATELETS: 289 10*3/uL (ref 150–400)
RBC: 4.42 MIL/uL (ref 3.87–5.11)
RDW: 13.2 % (ref 11.5–15.5)
WBC: 10.5 10*3/uL (ref 4.0–10.5)
nRBC: 0 % (ref 0.0–0.2)

## 2018-03-28 LAB — ABO/RH: ABO/RH(D): A POS

## 2018-03-28 MED ORDER — ACETAMINOPHEN 325 MG PO TABS: 650.0000 mg | ORAL_TABLET | Freq: Four times a day (QID) | ORAL | Status: DC | PRN

## 2018-03-28 MED ORDER — GABAPENTIN 400 MG PO CAPS
800.0000 mg | ORAL_CAPSULE | Freq: Every day | ORAL | Status: DC
  Administered 2018-03-28: 800 mg via ORAL
  Filled 2018-03-28: qty 8
  Filled 2018-03-28 (×2): qty 2

## 2018-03-28 MED ORDER — METFORMIN HCL ER 500 MG PO TB24
500.0000 mg | ORAL_TABLET | Freq: Every day | ORAL | Status: DC
  Administered 2018-03-28 – 2018-03-29 (×2): 500 mg via ORAL
  Filled 2018-03-28 (×2): qty 1

## 2018-03-28 MED ORDER — INFLUENZA VAC SPLIT QUAD 0.5 ML IM SUSY
0.5000 mL | PREFILLED_SYRINGE | INTRAMUSCULAR | Status: AC
  Administered 2018-03-29: 09:00:00 0.5 mL via INTRAMUSCULAR
  Filled 2018-03-28: qty 0.5

## 2018-03-28 MED ORDER — SODIUM CHLORIDE 0.9 % IV SOLN
INTRAVENOUS | Status: DC
  Administered 2018-03-28: 09:00:00 via INTRAVENOUS

## 2018-03-28 MED ORDER — AMPHETAMINE-DEXTROAMPHETAMINE 5 MG PO TABS
30.0000 mg | ORAL_TABLET | Freq: Two times a day (BID) | ORAL | Status: DC
  Administered 2018-03-28: 13:00:00 15 mg via ORAL
  Administered 2018-03-29: 09:00:00 30 mg via ORAL
  Filled 2018-03-28 (×2): qty 6

## 2018-03-28 MED ORDER — MESALAMINE 1000 MG RE SUPP
1000.0000 mg | Freq: Every day | RECTAL | Status: DC
  Filled 2018-03-28 (×2): qty 1

## 2018-03-28 MED ORDER — HYDROCORTISONE ACETATE 25 MG RE SUPP
25.0000 mg | Freq: Two times a day (BID) | RECTAL | Status: DC | PRN
  Filled 2018-03-28: qty 1

## 2018-03-28 MED ORDER — ZOLPIDEM TARTRATE 5 MG PO TABS
5.0000 mg | ORAL_TABLET | Freq: Every evening | ORAL | Status: DC | PRN
  Filled 2018-03-28: qty 1

## 2018-03-28 MED ORDER — TRAMADOL HCL 50 MG PO TABS
50.0000 mg | ORAL_TABLET | Freq: Four times a day (QID) | ORAL | Status: DC | PRN
  Administered 2018-03-28 – 2018-03-29 (×2): 50 mg via ORAL
  Filled 2018-03-28 (×2): qty 1

## 2018-03-28 MED ORDER — ACETAMINOPHEN 500 MG PO TABS
500.0000 mg | ORAL_TABLET | Freq: Four times a day (QID) | ORAL | Status: DC | PRN
  Administered 2018-03-28: 17:00:00 1000 mg via ORAL
  Filled 2018-03-28: qty 2

## 2018-03-28 MED ORDER — TRAMADOL HCL 50 MG PO TABS
50.0000 mg | ORAL_TABLET | Freq: Two times a day (BID) | ORAL | Status: DC | PRN
  Administered 2018-03-28: 13:00:00 50 mg via ORAL
  Filled 2018-03-28: qty 1

## 2018-03-28 MED ORDER — DULOXETINE HCL 30 MG PO CPEP
60.0000 mg | ORAL_CAPSULE | Freq: Every day | ORAL | Status: DC
  Administered 2018-03-28 – 2018-03-29 (×2): 60 mg via ORAL
  Filled 2018-03-28 (×2): qty 2

## 2018-03-28 MED ORDER — MELATONIN 5 MG PO TABS
2.5000 mg | ORAL_TABLET | Freq: Every day | ORAL | Status: DC
  Administered 2018-03-28: 21:00:00 2.5 mg via ORAL
  Filled 2018-03-28 (×2): qty 0.5

## 2018-03-28 NOTE — Progress Notes (Signed)
   03/28/18 1305  Clinical Encounter Type  Visited With Patient  Visit Type Initial;Spiritual support  Referral From Nurse  Consult/Referral To Chaplain  Spiritual Encounters  Spiritual Needs Other (Comment)   CH received an OR for an AD. I provided education and instructed on what the next steps would be to complete the process.

## 2018-03-28 NOTE — Procedures (Signed)
After obtaining informed consent, the patient was brought down to the ultrasound suite. Subsequently the left kidney was identified under ultrasound. The left flank was prepped and draped in standard sterile fashion. Local anesthesia was achieved using 1% lidocaine. Subsequently using an 18-gauge biopsy device and ultraound guidance, a total of 3 passes were made into the left kidney. The specimens were submitted to pathology and deemed to be adequate for submission. No active bleeding noted on post-biopsy images.   The patient tolerated the procedure very well. He will return to his room for continued observation.   Estimated blood loss: Minimal.  Complications: No immediate complications.

## 2018-03-28 NOTE — Progress Notes (Addendum)
Pt refused to use bedpan despite education and order to stay on back until 1530, states that she will go to BR and back to bed

## 2018-03-29 DIAGNOSIS — R3129 Other microscopic hematuria: Secondary | ICD-10-CM | POA: Diagnosis not present

## 2018-03-29 LAB — CBC
HCT: 40.1 % (ref 36.0–46.0)
HEMOGLOBIN: 12.5 g/dL (ref 12.0–15.0)
MCH: 29.8 pg (ref 26.0–34.0)
MCHC: 31.2 g/dL (ref 30.0–36.0)
MCV: 95.5 fL (ref 80.0–100.0)
Platelets: 267 10*3/uL (ref 150–400)
RBC: 4.2 MIL/uL (ref 3.87–5.11)
RDW: 13.2 % (ref 11.5–15.5)
WBC: 7.7 10*3/uL (ref 4.0–10.5)
nRBC: 0 % (ref 0.0–0.2)

## 2018-03-29 MED ORDER — ACETAMINOPHEN 500 MG PO TABS: 500.0000 mg | ORAL_TABLET | Freq: Four times a day (QID) | ORAL | 0 refills | Status: DC | PRN

## 2018-03-29 NOTE — Plan of Care (Signed)
  Problem: Education: Goal: Knowledge of General Education information will improve Description: Including pain rating scale, medication(s)/side effects and non-pharmacologic comfort measures Outcome: Progressing   Problem: Health Behavior/Discharge Planning: Goal: Ability to manage health-related needs will improve Outcome: Progressing   Problem: Clinical Measurements: Goal: Will remain free from infection Outcome: Progressing   Problem: Pain Managment: Goal: General experience of comfort will improve Outcome: Progressing   

## 2018-03-29 NOTE — Discharge Summary (Signed)
Physician Discharge Summary  Patient ID: Gwendolyn [name] MRN: [id number] DOB/AGE: Mar 01, 1971 47 y.o.  Admit date: 03/28/2018 Discharge date: 03/29/2018  Admission Diagnoses: Proteinuria, hematuria.  Discharge Diagnoses:  Proteinuria, hematuria  Discharged Condition: Good  Hospital Course: Patient came in for percutaneous ultrasound guided renal biopsy.  She tolerated the procedure well.  Specimen sent to [facility] nephropathology for expert reading.   Consults: None.  Significant Diagnostic Studies: Percutaneous US guided left renal biopsy.  Treatments: None  Discharge Exam: Blood pressure 110/69, pulse 79, temperature 98.6 F (37 C), resp. rate 19, height 5\' 7"  (1.702 m), weight 85.3 kg, SpO2 99 %. HEENT: [place]/AT hearing intact OM moist Lungs: CTAB CV: S1S2 no rubs Abd: soft NTND BS present, no flank tenderness Ext: no edema  Disposition: To home   A/P  1.  Microscopic hematuria/proteinuria:  Patient underwent renal biopy and tolerated well.  No heavy lifting > 5lbs x 2 weeks, no NSAIDs.  Will discharge to home.    Signed: Dyesha Henault 03/29/2018, 11:07 AM

## 2018-03-29 NOTE — Progress Notes (Signed)
   03/29/18 1000  Clinical Encounter Type  Visited With Patient;Family  Visit Type Initial (AD education and completion.)  Referral From Chaplain  Recommendations Follow-up as requested.  Spiritual Encounters  Spiritual Needs Emotional   Chaplain was asked to assist the patient and her husband, Elyn Aquas. Adger, in the completion of their Advanced Directives. The couple had received education previously by another chaplain at [facility]. Chaplain confirmed their choices, secured witnesses and a notary. Both Advanced Directives were completed. The patient's AD was copied and placed in her file.

## 2018-04-04 ENCOUNTER — Encounter: Payer: Self-pay | Admitting: Nephrology

## 2018-04-04 LAB — SURGICAL PATHOLOGY

## 2018-04-09 ENCOUNTER — Encounter: Payer: Self-pay | Admitting: Nephrology

## 2018-04-30 ENCOUNTER — Encounter: Payer: Self-pay | Admitting: Nephrology

## 2018-05-11 IMAGING — MG MM DIGITAL SCREENING BILAT W/ TOMO W/ CAD
8 of 12 series · 8 of 28 positions shown · non-contrast
Comparison: Previous exam(s).

CLINICAL DATA: Screening.

EXAM:
2D DIGITAL SCREENING BILATERAL MAMMOGRAM WITH CAD AND ADJUNCT TOMO

[R CC]
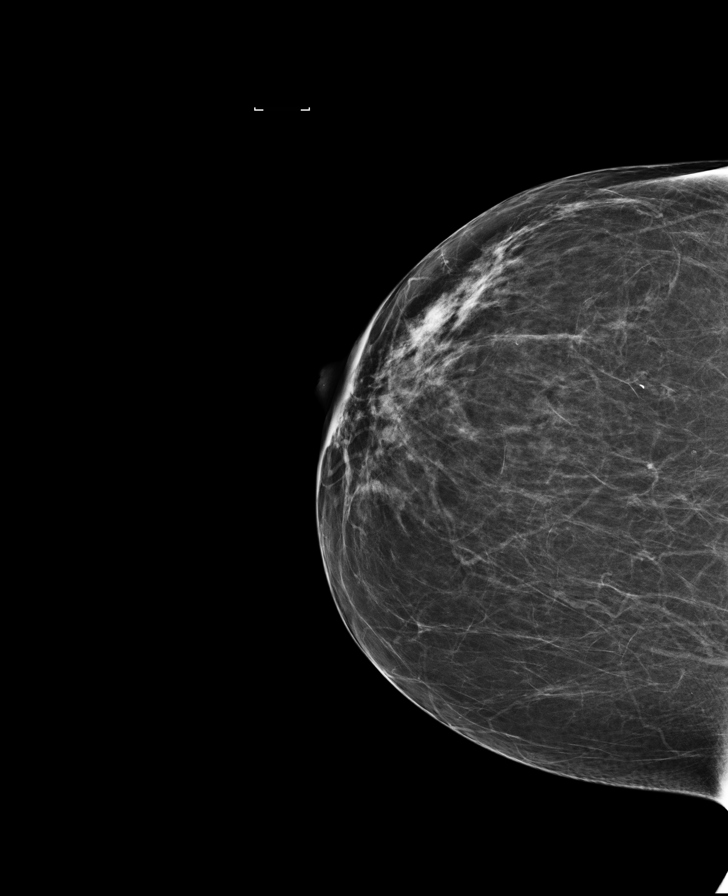

[L MLO]
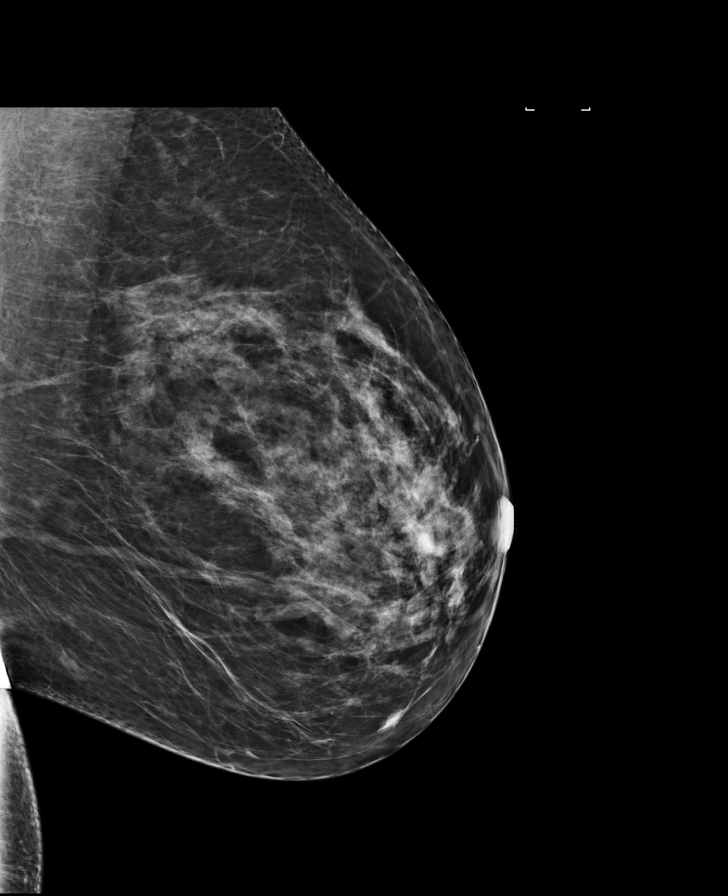

[R CC synth-2D]
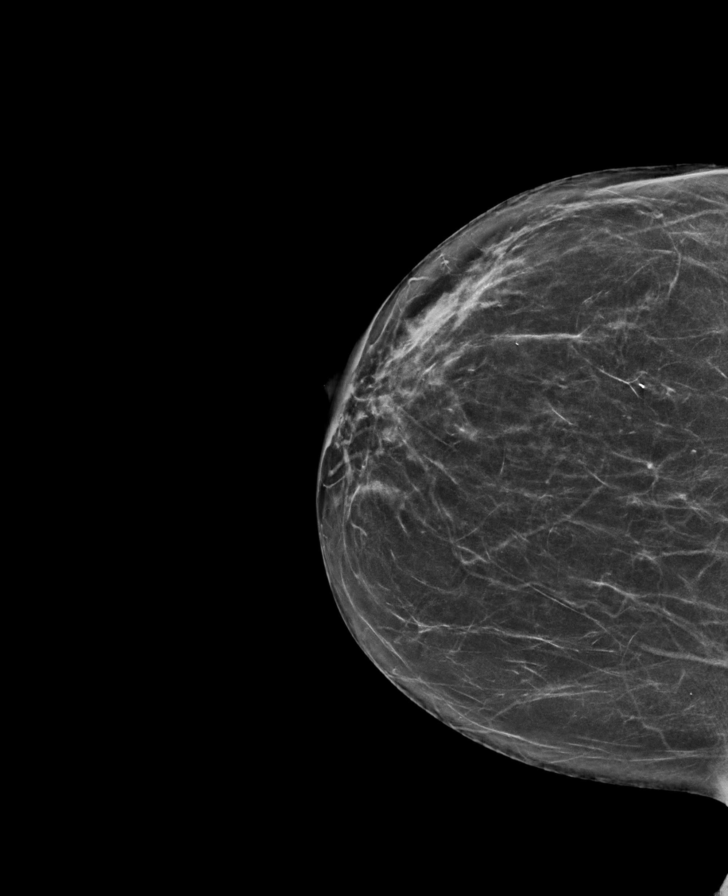

[L MLO synth-2D]
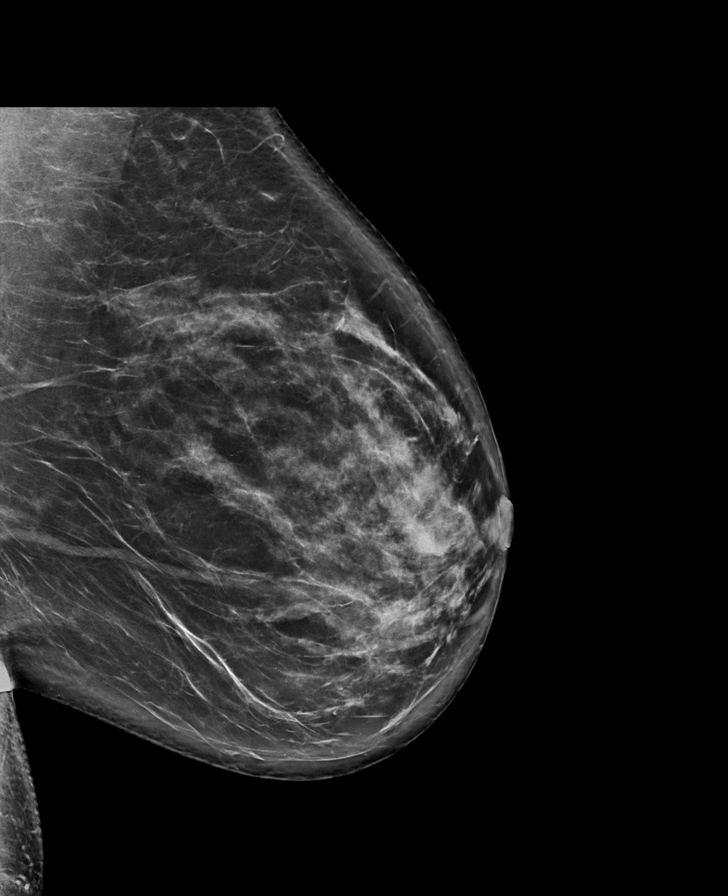

[R MLO synth-2D]
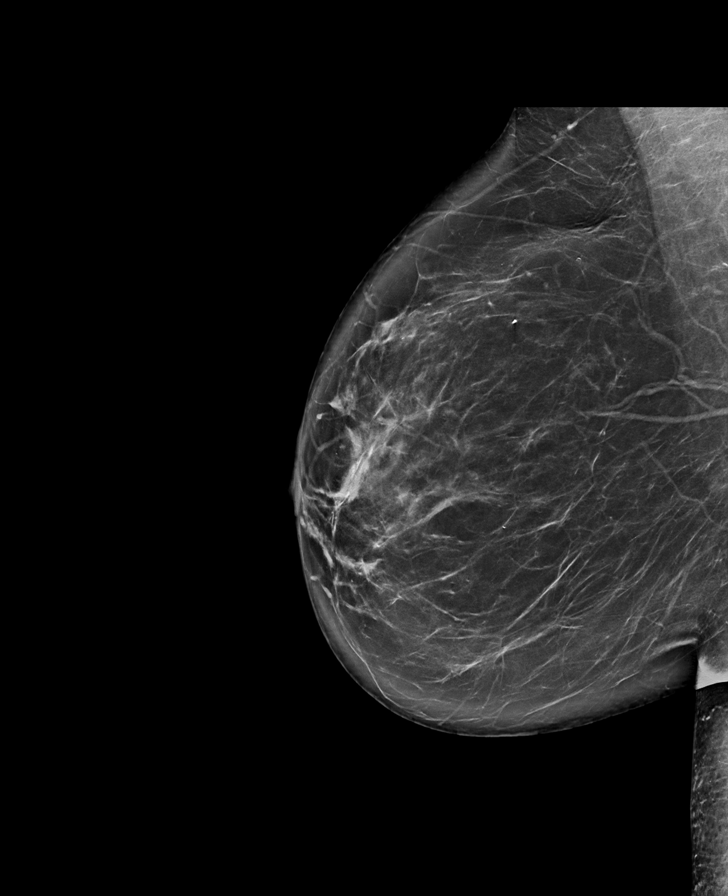

[R MLO]
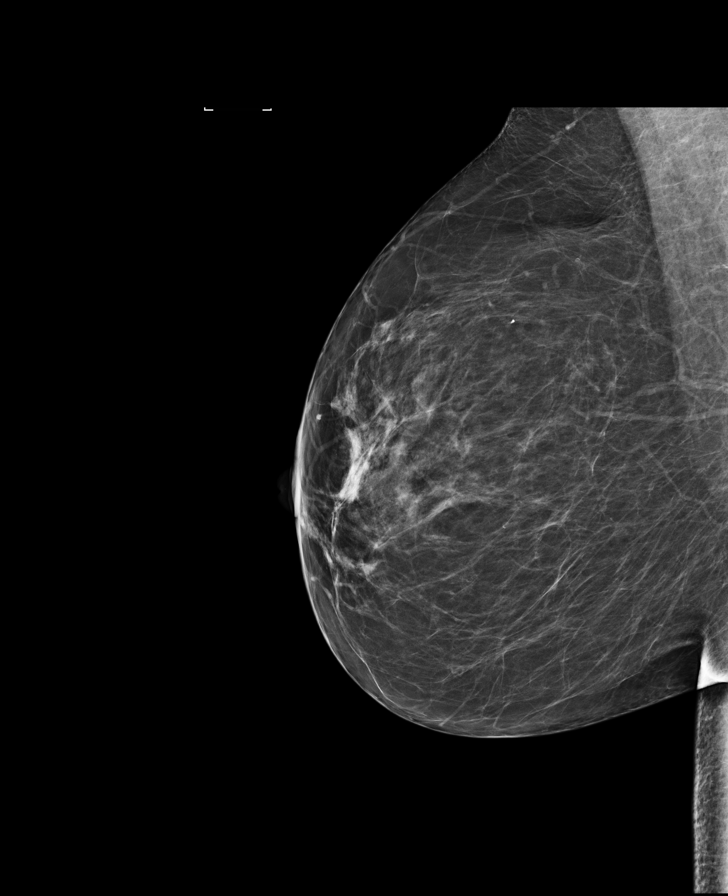

[L CC synth-2D]
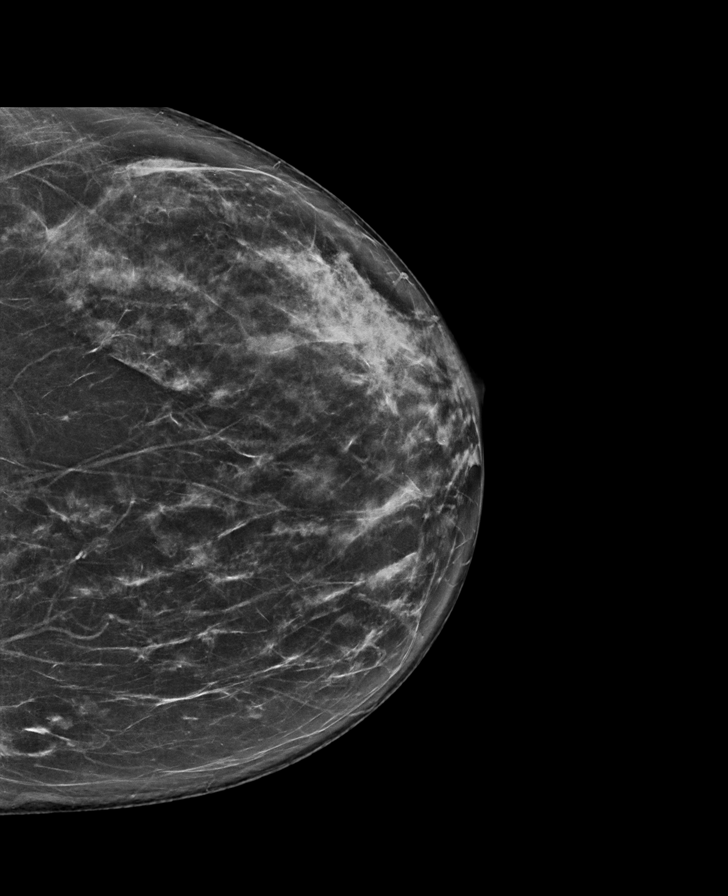

[L CC]
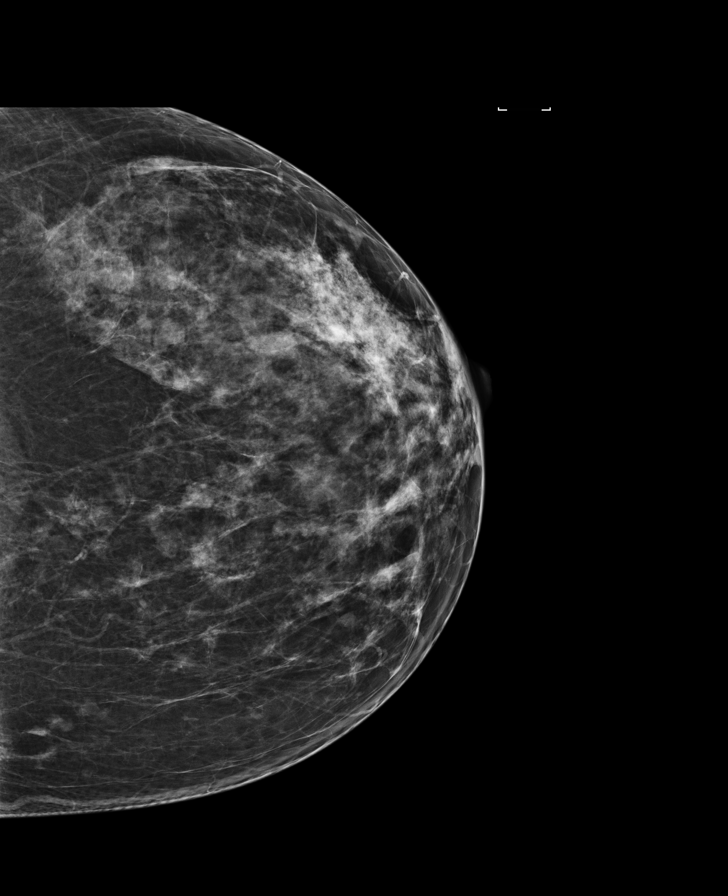

[8 of 28 positions shown; findings below may reference images not displayed]

ACR Breast Density Category b: There are scattered areas of
fibroglandular density.
FINDINGS: There are no findings suspicious for malignancy. Images were
processed with CAD.
IMPRESSION: No mammographic evidence of malignancy. A result letter of this
screening mammogram will be mailed directly to the patient.

RECOMMENDATION:
Screening mammogram in one year. (Code:97-6-RS4)

BI-RADS CATEGORY  1: Negative.

## 2018-05-13 ENCOUNTER — Other Ambulatory Visit: Payer: Self-pay

## 2018-05-13 ENCOUNTER — Other Ambulatory Visit (INDEPENDENT_AMBULATORY_CARE_PROVIDER_SITE_OTHER): Payer: BC Managed Care – PPO

## 2018-05-13 ENCOUNTER — Other Ambulatory Visit: Payer: Self-pay | Admitting: Internal Medicine

## 2018-05-13 ENCOUNTER — Ambulatory Visit: Payer: Self-pay | Admitting: *Deleted

## 2018-05-13 DIAGNOSIS — R300 Dysuria: Secondary | ICD-10-CM | POA: Diagnosis not present

## 2018-05-13 LAB — URINALYSIS, ROUTINE W REFLEX MICROSCOPIC
BILIRUBIN URINE: NEGATIVE
Ketones, ur: NEGATIVE
Nitrite: NEGATIVE
Specific Gravity, Urine: <=1.005 — AB (ref 1.000–1.030)
Total Protein, Urine: NEGATIVE
Urine Glucose: NEGATIVE
Urobilinogen, UA: 0.2 (ref 0.0–1.0)
pH: 6 (ref 5.0–8.0)

## 2018-05-13 MED ORDER — SULFAMETHOXAZOLE-TRIMETHOPRIM 800-160 MG PO TABS: 1.0000 | ORAL_TABLET | Freq: Two times a day (BID) | ORAL | 0 refills | Status: DC

## 2018-05-13 NOTE — Telephone Encounter (Signed)
My Chart message sent

## 2018-05-13 NOTE — Telephone Encounter (Signed)
Patient would like Dr Derrel Nip to call her in some Cipro for UTI symptoms. She said she has blood in her urine which she always has because of a condition she has. Her other symptoms are burning, frequency and pain when urinating. Please Advise.   Patient would like medication to be called in- if she has to be seen- she wants to be seen at [facility] location due to her work location. Reason for Disposition . Urinating more frequently than usual (i.e., frequency)  Answer Assessment - Initial Assessment Questions 1. SYMPTOM: "What's the main symptom you're concerned about?" (e.g., frequency, incontinence)     Burning, urgency, lower abdominal pain 2. ONSET: "When did the  symptoms  start?"     2 hours ago 3. PAIN: "Is there any pain?" If so, ask: "How bad is it?" (Scale: 1-10; mild, moderate, severe)     Yes- 7 4. CAUSE: "What do you think is causing the symptoms?"     UTI 5. OTHER SYMPTOMS: "Do you have any other symptoms?" (e.g., fever, flank pain, blood in urine, pain with urination)     Flank pain- patient always has 6. PREGNANCY: "Is there any chance you are pregnant?" "When was your last menstrual period?"     n/a  Protocols used: URINARY [facility]

## 2018-05-13 NOTE — Telephone Encounter (Signed)
I have scheduled lab for patient at [facility] near where she works. I advised that micro & culture would not be resulted today. Patient stated that she would call back this afternoon to see if we saw results of POCT urine dip. Patient stated that she was in right much pain.

## 2018-05-13 NOTE — Telephone Encounter (Signed)
I called patient & left detailed message to please call back office to make lab appointment to give urine specimen. I informed that I would place orders, but she HAD to make lab appointment to come in to give sample.

## 2018-05-13 NOTE — Telephone Encounter (Signed)
Patient notified that antibiotic was sent in.

## 2018-05-15 LAB — URINE CULTURE
MICRO NUMBER:: 83268
SPECIMEN QUALITY:: ADEQUATE

## 2018-05-30 ENCOUNTER — Other Ambulatory Visit: Payer: Self-pay | Admitting: Internal Medicine

## 2018-06-05 ENCOUNTER — Encounter: Payer: Self-pay | Admitting: Internal Medicine

## 2018-06-05 ENCOUNTER — Other Ambulatory Visit (HOSPITAL_COMMUNITY)
Admission: RE | Admit: 2018-06-05 | Discharge: 2018-06-05 | Disposition: A | Discharge status: Home / Self Care | Payer: BC Managed Care – PPO | Source: Ambulatory Visit | Attending: Internal Medicine | Admitting: Internal Medicine

## 2018-06-05 ENCOUNTER — Ambulatory Visit: Payer: BC Managed Care – PPO | Admitting: Internal Medicine

## 2018-06-05 VITALS — BP 100/60 | HR 84 | Temp 98.8°F | Resp 15 | Ht 67.0 in | Wt 179.4 lb

## 2018-06-05 DIAGNOSIS — R109 Unspecified abdominal pain: Secondary | ICD-10-CM | POA: Diagnosis not present

## 2018-06-05 DIAGNOSIS — F529 Unspecified sexual dysfunction not due to a substance or known physiological condition: Secondary | ICD-10-CM

## 2018-06-05 DIAGNOSIS — R634 Abnormal weight loss: Secondary | ICD-10-CM | POA: Diagnosis not present

## 2018-06-05 DIAGNOSIS — N951 Menopausal and female climacteric states: Secondary | ICD-10-CM | POA: Diagnosis not present

## 2018-06-05 DIAGNOSIS — N760 Acute vaginitis: Secondary | ICD-10-CM | POA: Insufficient documentation

## 2018-06-05 LAB — COMPREHENSIVE METABOLIC PANEL
ALK PHOS: 29 U/L — AB (ref 39–117)
ALT: 13 U/L (ref 0–35)
AST: 17 U/L (ref 0–37)
Albumin: 4.4 g/dL (ref 3.5–5.2)
BUN: 11 mg/dL (ref 6–23)
CO2: 23 mEq/L (ref 19–32)
Calcium: 9.4 mg/dL (ref 8.4–10.5)
Chloride: 103 mEq/L (ref 96–112)
Creatinine, Ser: 0.81 mg/dL (ref 0.40–1.20)
GFR: 75.11 mL/min (ref >60.00)
Glucose, Bld: 96 mg/dL (ref 70–99)
Potassium: 4.2 mEq/L (ref 3.5–5.1)
Sodium: 136 mEq/L (ref 135–145)
Total Bilirubin: 0.6 mg/dL (ref 0.2–1.2)
Total Protein: 7.3 g/dL (ref 6.0–8.3)

## 2018-06-05 LAB — URINALYSIS, ROUTINE W REFLEX MICROSCOPIC
Bilirubin Urine: NEGATIVE
Ketones, ur: NEGATIVE
Leukocytes,Ua: NEGATIVE
Nitrite: NEGATIVE
Specific Gravity, Urine: <=1.005 — AB (ref 1.000–1.030)
Total Protein, Urine: NEGATIVE
Urine Glucose: NEGATIVE
Urobilinogen, UA: 0.2 (ref 0.0–1.0)
WBC, UA: NONE SEEN (ref 0–?)
pH: 6 (ref 5.0–8.0)

## 2018-06-05 LAB — TSH: TSH: 1.46 u[IU]/mL (ref 0.35–4.50)

## 2018-06-05 MED ORDER — CIPROFLOXACIN HCL 250 MG PO TABS: 250.0000 mg | ORAL_TABLET | Freq: Two times a day (BID) | ORAL | 1 refills | Status: DC

## 2018-06-05 MED ORDER — METFORMIN HCL ER 500 MG PO TB24: ORAL_TABLET | ORAL | 1 refills | Status: DC

## 2018-06-05 NOTE — Progress Notes (Signed)
Subjective:  Patient ID: Gwendolyn [name], female    DOB: [birth year]  Age: 48 y.o. MRN: [id number]  CC: The primary encounter diagnosis was Acute vaginitis. Diagnoses of Menopause syndrome, Weight loss, Flank pain, and Sexual arousal disorder were also pertinent to this visit.  HPI Gwendolyn [name] presents for vgainal/urinary symptoms  Treated for uti in January  with septra. Symptoms of burning are still present and she has self treated a vaginal discharge with OTC antifungals with no change.  She has had increased sexual activity for the past 5 months due to what she describes an insatiable libido.  She is having intercourse 5 days per week with a female colleague from work, as well as with her somewhat frail and elderly husband. The sex with her colleague is voluntarily  rough and involves using positions she was previously not accustomed to , and she is often tender afterwards in her vaginal area.  She denies feeling unsafe or coerced into engaging in sexual activities that she does not want to do.  Her husband is not aware of the extramarital sexual encounters/affair.   Thirsty all the time.  diabetes workup done  Increased sexual drive,  Increased thirst .   Has been tested for diabetes and STD  In October . All negatve  Flank pain, suprpubic pain   Has had urology and nephrology  eval incl biopsy for hematuria . Thin basement membrane disease  Still having flank pain  Intentional weight loss of 20 lbs since Jan 13 when she reached 193   Outpatient Medications Prior to Visit  Medication Sig Dispense Refill  . amphetamine-dextroamphetamine (ADDERALL) 30 MG tablet Take 30 mg by mouth 2 (two) times daily.   0  . diltiazem 2 % GEL Apply 1 application topically 2 (two) times daily. X 4-6 weeks 60 g 0  . DULoxetine (CYMBALTA) 60 MG capsule Take 60 mg by mouth daily.    Marland Kitchen gabapentin (NEURONTIN) 800 MG tablet TAKE 1 TABLET (800 MG TOTAL) BY MOUTH NIGHTLY. 90 tablet 1  . Ginkgo  Biloba 40 MG TABS Take by mouth.    . hydrocortisone (ANUSOL-HC) 25 MG suppository Place 25 mg rectally 2 (two) times daily as needed for hemorrhoids or anal itching.    . Melatonin 3 MG TABS Take 1 tablet by mouth at bedtime.    . tiaGABine (GABITRIL) 4 MG tablet Takes 4mg  in the AM and 12mg  at bedtime    . zolpidem (AMBIEN) 10 MG tablet Take 1 tablet (10 mg total) by mouth at bedtime as needed for sleep. 30 tablet 5  . metFORMIN (GLUCOPHAGE-XR) 500 MG 24 hr tablet TAKE 1 TABLET BY MOUTH EVERY DAY WITH BREAKFAST 90 tablet 1  . DULoxetine HCl 40 MG CPEP     . acetaminophen (TYLENOL) 500 MG tablet Take 1-2 tablets (500-1,000 mg total) by mouth every 6 (six) hours as needed for mild pain. 30 tablet 0  . diazepam (VALIUM) 10 MG tablet Take one 30 minutes prior to cystoscopy (Patient not taking: Reported on 03/28/2018) 1 tablet 0  . mesalamine (CANASA) 1000 MG suppository Place 1 suppository (1,000 mg total) rectally at bedtime. (Patient taking differently: Place 1,000 mg rectally at bedtime as needed. ) 30 suppository 12  . sulfamethoxazole-trimethoprim (BACTRIM DS,SEPTRA DS) 800-160 MG tablet Take 1 tablet by mouth 2 (two) times daily. 10 tablet 0   No facility-administered medications prior to visit.     Review of Systems;  Patient denies headache, fevers, malaise, unintentional weight loss, skin  rash, eye pain, sinus congestion and sinus pain, sore throat, dysphagia,  hemoptysis , cough, dyspnea, wheezing, chest pain, palpitations, orthopnea, edema, abdominal pain, nausea, melena, diarrhea, constipation, flank pain, dysuria, hematuria, urinary  Frequency, nocturia, numbness, tingling, seizures,  Focal weakness, Loss of consciousness,  Tremor, insomnia, depression, anxiety, and suicidal ideation.      Objective:  BP 100/60 (BP Location: Left Arm, Patient Position: Sitting, Cuff Size: Normal)   Pulse 84   Temp 98.8 F (37.1 C) (Oral)   Resp 15   Ht 5\' 7"  (1.702 m)   Wt 179 lb 6.4 oz (81.4  kg)   SpO2 97%   BMI 28.10 kg/m   BP Readings from Last 3 Encounters:  06/05/18 100/60  03/29/18 110/69  03/06/18 110/68    Wt Readings from Last 3 Encounters:  06/05/18 179 lb 6.4 oz (81.4 kg)  03/28/18 188 lb 0.8 oz (85.3 kg)  03/06/18 194 lb 3.2 oz (88.1 kg)    General Appearance:    Alert, cooperative, no distress, appears stated age  Head:    Normocephalic, without obvious abnormality, atraumatic  Eyes:    PERRL, conjunctiva/corneas clear, EOM's intact, fundi    benign, both eyes  Ears:    Normal TM's and external ear canals, both ears  Nose:   Nares normal, septum midline, mucosa normal, no drainage    or sinus tenderness  Throat:   Lips, mucosa, and tongue normal; teeth and gums normal  Neck:   Supple, symmetrical, trachea midline, no adenopathy;    thyroid:  no enlargement/tenderness/nodules; no carotid   bruit or JVD  Back:     Symmetric, no curvature, ROM normal, no CVA tenderness  Lungs:     Clear to auscultation bilaterally, respirations unlabored  Chest Wall:    No tenderness or deformity   Heart:    Regular rate and rhythm, S1 and S2 normal, no murmur, rub   or gallop        Genitalia:    Pelvic: cervix normal in appearance, external genitalia normal, no adnexal masses or tenderness, no cervical motion tenderness, rectovaginal septum normal, uterus normal size, shape, and consistency and vagina normal without discharge  Extremities:   Extremities normal, atraumatic, no cyanosis or edema  Pulses:   2+ and symmetric all extremities  Skin:   Skin color, texture, turgor normal, no rashes or lesions  Lymph nodes:   Cervical, supraclavicular, and axillary nodes normal  Neurologic:   CNII-XII intact, normal strength, sensation and reflexes    throughout     Lab Results  Component Value Date   HGBA1C 5.7 07/22/2015   HGBA1C 5.5 10/07/2014    Lab Results  Component Value Date   CREATININE 0.81 06/05/2018   CREATININE 0.87 03/26/2018   CREATININE 0.95  03/06/2018    Lab Results  Component Value Date   WBC 7.7 03/29/2018   HGB 12.5 03/29/2018   HCT 40.1 03/29/2018   PLT 267 03/29/2018   GLUCOSE 96 06/05/2018   CHOL 210 (H) 10/02/2017   TRIG 191 (H) 10/02/2017   HDL 45 10/02/2017   LDLCALC 127 (H) 10/02/2017   ALT 13 06/05/2018   AST 17 06/05/2018   NA 136 06/05/2018   K 4.2 06/05/2018   CL 103 06/05/2018   CREATININE 0.81 06/05/2018   BUN 11 06/05/2018   CO2 23 06/05/2018   TSH 1.46 06/05/2018   INR 0.85 03/26/2018   HGBA1C 5.7 07/22/2015    No results found.  Assessment & Plan:  Problem List Items Addressed This Visit    Vaginitis - Primary    STD testing was negative today.  Her symptoms are likely due to vaginal irritation caused by excessive friction  And age related vaginal epithelial thinning.  Advised to use lubrication        Relevant Orders   Cervicovaginal ancillary only( [place]) (Completed)   HIV antibody (with reflex) (Completed)   Sexual arousal disorder    She has been having extramarital sexual encounters 5 days per week with a colleague.  She reports an insatiable sexual appetite,  Since September 2019.  Advised to discuss with her psychiatrist. Thyroid,  Testosterone and DHEA levels pending         Other Visit Diagnoses    Menopause syndrome       Relevant Orders   DHEA-sulfate (Completed)   Testos,Total,Free and SHBG (Female)   Weight loss       Relevant Orders   TSH (Completed)   Comprehensive metabolic panel (Completed)   Flank pain       Relevant Orders   Urinalysis, Routine w reflex microscopic (Completed)   Urine Culture (Completed)      I have discontinued Ailani Isabell's mesalamine, diazepam, acetaminophen, and sulfamethoxazole-trimethoprim. I am also having her maintain her tiaGABine, Melatonin, DULoxetine, Ginkgo Biloba, amphetamine-dextroamphetamine, hydrocortisone, diltiazem, zolpidem, gabapentin, DULoxetine HCl, metFORMIN, and ciprofloxacin.  Meds ordered  this encounter  Medications  . metFORMIN (GLUCOPHAGE-XR) 500 MG 24 hr tablet    Sig: TAKE 1 TABLET BY MOUTH EVERY DAY WITH BREAKFAST    Dispense:  90 tablet    Refill:  1  . ciprofloxacin (CIPRO) 250 MG tablet    Sig: Take 1 tablet (250 mg total) by mouth 2 (two) times daily.    Dispense:  14 tablet    Refill:  1    Medications Discontinued During This Encounter  Medication Reason  . mesalamine (CANASA) 1000 MG suppository Patient Preference  . diazepam (VALIUM) 10 MG tablet Completed Course  . acetaminophen (TYLENOL) 500 MG tablet Patient Preference  . sulfamethoxazole-trimethoprim (BACTRIM DS,SEPTRA DS) 800-160 MG tablet Completed Course  . metFORMIN (GLUCOPHAGE-XR) 500 MG 24 hr tablet Reorder    Follow-up: No follow-ups on file.   [name], MD

## 2018-06-05 NOTE — Patient Instructions (Signed)
If any of the the infectious tests are positive,  I recommend having your partners treated by their PCP

## 2018-06-06 LAB — CERVICOVAGINAL ANCILLARY ONLY
Bacterial vaginitis: NEGATIVE
CANDIDA VAGINITIS: NEGATIVE
Chlamydia: NEGATIVE
Neisseria Gonorrhea: NEGATIVE
Trichomonas: NEGATIVE

## 2018-06-06 LAB — URINE CULTURE
MICRO NUMBER:: 191641
Result:: NO GROWTH
SPECIMEN QUALITY:: ADEQUATE

## 2018-06-07 DIAGNOSIS — F529 Unspecified sexual dysfunction not due to a substance or known physiological condition: Secondary | ICD-10-CM | POA: Insufficient documentation

## 2018-06-07 NOTE — Assessment & Plan Note (Signed)
She has been having extramarital sexual encounters 5 days per week with a colleague.  She reports an insatiable sexual appetite,  Since September 2019.  Advised to discuss with her psychiatrist. Thyroid,  Testosterone and DHEA levels pending

## 2018-06-07 NOTE — Assessment & Plan Note (Signed)
STD testing was negative today.  Her symptoms are likely due to vaginal irritation caused by excessive friction  And age related vaginal epithelial thinning.  Advised to use lubrication

## 2018-06-11 LAB — HIV ANTIBODY (ROUTINE TESTING W REFLEX): HIV 1&2 Ab, 4th Generation: NONREACTIVE

## 2018-06-11 LAB — TESTOS,TOTAL,FREE AND SHBG (FEMALE)
Free Testosterone: 3 pg/mL (ref 0.1–6.4)
Sex Hormone Binding: 29 nmol/L (ref 17–124)
Testosterone, Total, LC-MS-MS: 20 ng/dL (ref 2–45)

## 2018-06-11 LAB — DHEA-SULFATE: DHEA-SO4: 80 ug/dL (ref 19–231)

## 2018-07-07 ENCOUNTER — Other Ambulatory Visit: Payer: Self-pay | Admitting: Internal Medicine

## 2018-07-07 DIAGNOSIS — M797 Fibromyalgia: Secondary | ICD-10-CM

## 2018-09-18 ENCOUNTER — Encounter: Payer: Self-pay | Admitting: Internal Medicine

## 2018-09-18 ENCOUNTER — Other Ambulatory Visit: Payer: Self-pay

## 2018-09-18 ENCOUNTER — Ambulatory Visit (INDEPENDENT_AMBULATORY_CARE_PROVIDER_SITE_OTHER): Payer: BC Managed Care – PPO | Admitting: Internal Medicine

## 2018-09-18 DIAGNOSIS — B3789 Other sites of candidiasis: Secondary | ICD-10-CM | POA: Diagnosis not present

## 2018-09-18 MED ORDER — CEPHALEXIN 500 MG PO CAPS: 500.0000 mg | ORAL_CAPSULE | Freq: Four times a day (QID) | ORAL | 0 refills | Status: DC

## 2018-09-18 MED ORDER — NYSTATIN 100000 UNIT/GM EX POWD: Freq: Two times a day (BID) | CUTANEOUS | 0 refills | Status: DC

## 2018-09-18 NOTE — Progress Notes (Signed)
Pt has sent you couple of pictures through mychart. Pt stated that the rash does not itch or hurt unless she does messes with it. Pt stated that is king of looks like ring worm to her.

## 2018-09-18 NOTE — Progress Notes (Signed)
Virtual Visit converted to telephone  This visit type was conducted due to national recommendations for restrictions regarding the COVID-19 pandemic (e.g. social distancing).  This format is felt to be most appropriate for this patient at this time.  All issues noted in this document were discussed and addressed.  No physical exam was performed (except for noted visual exam findings with Video Visits).   I attempted to connect with@ on 09/17/18 at 12:00 PM EDT by a video enabled telemedicine application ; however  Interactive audio and video telecommunications  Ultimately failed, due to patient having technical difficulties.   We continued and completed visit with audio only. I and verified that I am speaking with the correct person using two identifiers.  Location patient: home Location provider: work or home office Persons participating in the virtual visit: patient, provider  I discussed the limitations, risks, security and privacy concerns of performing an evaluation and management service by telephone and the availability of in person appointments. I also discussed with the patient that there may be a patient responsible charge related to this service. The patient expressed understanding and agreed to proceed.  Reason for visit: pruritic rash under breast   HPI:  48 yr old female presents with pruritic rash under breast that appeared one month ago. She has been treating it with   has not resolved with daily use of  Monistat and 2 days of fluconazole    ROS: See pertinent positives and negatives per HPI.  Past Medical History:  Diagnosis Date  . Anal fissure   . Anxiety   . Breast cancer ([place])    lumpectomy  . Depression   . Fibromyalgia   . Gallstones   . Irritable bowel syndrome   . Yeast vaginitis     Past Surgical History:  Procedure Laterality Date  . BREAST LUMPECTOMY Right 2003   Radiatiomn Therapy  . CHOLECYSTECTOMY  2000  . COLECTOMY  10/31/2012   SECONDARY TO  COLONIC INERTIA  . SUPRACERVICAL ABDOMINAL HYSTERECTOMY  04/10/2011   But still has ovaries.    Family History  Problem Relation Age of Onset  . Meniere's disease Father   . Alcohol abuse Sister   . Heart disease Maternal Grandmother   . Rectal cancer Maternal Grandmother   . Heart disease Maternal Grandfather   . Heart disease Paternal Grandmother   . Heart disease Paternal Grandfather   . Rectal cancer Other        mat great gm  . Colon cancer Neg Hx   . Esophageal cancer Neg Hx     SOCIAL HX:  Social History   Tobacco Use  . Smoking status: Former Smoker    Years: 20.00    Last attempt to quit: 04/23/2002    Years since quitting: 16.4  . Smokeless tobacco: Never Used  . Tobacco comment: QUIT IN 2004  Substance Use Topics  . Alcohol use: Yes    Alcohol/week: 0.0 standard drinks    Comment: OCCASIONALLY  . Drug use: No     Current Outpatient Medications:  .  amphetamine-dextroamphetamine (ADDERALL) 30 MG tablet, Take 30 mg by mouth 2 (two) times daily. , Disp: , Rfl: 0 .  ciprofloxacin (CIPRO) 250 MG tablet, Take 1 tablet (250 mg total) by mouth 2 (two) times daily., Disp: 14 tablet, Rfl: 1 .  diltiazem 2 % GEL, Apply 1 application topically 2 (two) times daily. X 4-6 weeks, Disp: 60 g, Rfl: 0 .  DULoxetine (CYMBALTA) 60 MG capsule, Take  60 mg by mouth daily., Disp: , Rfl:  .  DULoxetine HCl 40 MG CPEP, , Disp: , Rfl:  .  gabapentin (NEURONTIN) 800 MG tablet, TAKE 1 TABLET (800 MG TOTAL) BY MOUTH NIGHTLY., Disp: 90 tablet, Rfl: 1 .  Ginkgo Biloba 40 MG TABS, Take by mouth., Disp: , Rfl:  .  hydrocortisone (ANUSOL-HC) 25 MG suppository, Place 25 mg rectally 2 (two) times daily as needed for hemorrhoids or anal itching., Disp: , Rfl:  .  Melatonin 3 MG TABS, Take 1 tablet by mouth at bedtime., Disp: , Rfl:  .  metFORMIN (GLUCOPHAGE-XR) 500 MG 24 hr tablet, TAKE 1 TABLET BY MOUTH EVERY DAY WITH BREAKFAST, Disp: 90 tablet, Rfl: 1 .  tiaGABine (GABITRIL) 4 MG tablet, Takes  4mg  in the AM and 12mg  at bedtime, Disp: , Rfl:  .  zolpidem (AMBIEN) 10 MG tablet, Take 1 tablet (10 mg total) by mouth at bedtime as needed for sleep., Disp: 30 tablet, Rfl: 5 .  cephALEXin (KEFLEX) 500 MG capsule, Take 1 capsule (500 mg total) by mouth 4 (four) times daily., Disp: 28 capsule, Rfl: 0 .  nystatin (MYCOSTATIN/NYSTOP) powder, Apply topically 2 (two) times daily. To rash until resolved., Disp: 15 g, Rfl: 0  EXAM:  VITALS per patient if applicable:  GENERAL: alert, oriented, appears well and in no acute distress  HEENT: atraumatic, conjunttiva clear, no obvious abnormalities on inspection of external nose and ears   NECK: normal movements of the head and neck  LUNGS: on inspection no signs of respiratory distress, breathing rate appears normal, no obvious gross SOB, gasping or wheezing  CV: no obvious cyanosis  MS: moves all visible extremities without noticeable abnormality Skin : erythematous papular rash on  the inferior side of breast  (viewed by photograph)   PSYCH/NEURO: pleasant and cooperative, no obvious depression or anxiety, speech and thought processing grossly intact  ASSESSMENT AND PLAN:  Discussed the following assessment and plan:  Candidiasis of breast  Candidiasis of breast Nystatin powder bid .  If no improvement in 4-5 days will add anti-staph anibiotic therapy with cephalexin,.      I discussed the assessment and treatment plan with the patient. The patient was provided an opportunity to ask questions and all were answered. The patient agreed with the plan and demonstrated an understanding of the instructions.   The patient was advised to call back or seek an in-person evaluation if the symptoms worsen or if the condition fails to improve as anticipated.  I provided  15 minutes of non-face-to-face time during this encounter.   [name], MD

## 2018-09-18 NOTE — Patient Instructions (Addendum)
You have a yeast infection.  Start using Nystatin powder two times daily on the candida  rash .  You will need to use it for a week minimum.  Once it has resolved,  It can recur.  To keep it from recurring, apply [place] medicated powder with zinc under both breasts after you bathe/shower and whenever you feel sweaty.   If you do not see improvement in the redness and pustules after 4 days,  It may be due to concurrent bacterial infection,  So ADD the cephalexin antibiotic .  This will clear it up  Send me pictures via mychart for an update

## 2018-09-20 DIAGNOSIS — B3789 Other sites of candidiasis: Secondary | ICD-10-CM | POA: Insufficient documentation

## 2018-09-20 NOTE — Assessment & Plan Note (Signed)
Nystatin powder bid .  If no improvement in 4-5 days will add anti-staph anibiotic therapy with cephalexin,.

## 2018-09-30 ENCOUNTER — Other Ambulatory Visit: Payer: Self-pay | Admitting: Internal Medicine

## 2018-10-01 ENCOUNTER — Telehealth: Payer: Self-pay

## 2018-10-01 NOTE — Telephone Encounter (Signed)
Copied from [place] [id number]. Topic: General - Inquiry >> Sep 30, 2018  4:10 PM Virl Axe D wrote: Reason for CRM: Pt stated she sent has been having UTI symptoms all day with frequent urination, pain. She would like to know if Dr. Derrel Nip can send in rx as soon as possible. Advised that office closed at 4pm. Please advise.  [id number] [address], [place] (Phone) [phone] (Fax)

## 2018-10-02 ENCOUNTER — Other Ambulatory Visit (INDEPENDENT_AMBULATORY_CARE_PROVIDER_SITE_OTHER): Payer: BC Managed Care – PPO

## 2018-10-02 ENCOUNTER — Other Ambulatory Visit: Payer: Self-pay

## 2018-10-02 ENCOUNTER — Telehealth: Payer: Self-pay

## 2018-10-02 DIAGNOSIS — R350 Frequency of micturition: Secondary | ICD-10-CM

## 2018-10-02 NOTE — Telephone Encounter (Signed)
Pt is scheduled with Philis Nettle tomorrow.

## 2018-10-02 NOTE — Telephone Encounter (Signed)
Gae Bon, CMA spoke with pt over the phone.

## 2018-10-02 NOTE — Telephone Encounter (Signed)
Pt sent a [facility] message about a possible UTI.  I called the pt and scheduled a lab for today and a virtual visit for tomorrow to go over result.  Delia Sitar,cma

## 2018-10-03 ENCOUNTER — Encounter: Payer: Self-pay | Admitting: Family Medicine

## 2018-10-03 ENCOUNTER — Ambulatory Visit (INDEPENDENT_AMBULATORY_CARE_PROVIDER_SITE_OTHER): Payer: BC Managed Care – PPO | Admitting: Family Medicine

## 2018-10-03 DIAGNOSIS — N390 Urinary tract infection, site not specified: Secondary | ICD-10-CM | POA: Diagnosis not present

## 2018-10-03 DIAGNOSIS — R300 Dysuria: Secondary | ICD-10-CM | POA: Diagnosis not present

## 2018-10-03 LAB — URINE CULTURE
MICRO NUMBER:: 560274
Result:: NO GROWTH
SPECIMEN QUALITY:: ADEQUATE

## 2018-10-03 LAB — URINALYSIS, ROUTINE W REFLEX MICROSCOPIC
Bilirubin Urine: NEGATIVE
Ketones, ur: NEGATIVE
Leukocytes,Ua: NEGATIVE
Nitrite: NEGATIVE
Specific Gravity, Urine: >=1.03 — AB (ref 1.000–1.030)
Total Protein, Urine: NEGATIVE
Urine Glucose: NEGATIVE
Urobilinogen, UA: 0.2 (ref 0.0–1.0)
WBC, UA: NONE SEEN (ref 0–?)
pH: 5.5 (ref 5.0–8.0)

## 2018-10-03 MED ORDER — CIPROFLOXACIN HCL 250 MG PO TABS: 250.0000 mg | ORAL_TABLET | Freq: Two times a day (BID) | ORAL | 0 refills | Status: DC

## 2018-10-03 NOTE — Telephone Encounter (Signed)
Refill request for cipro.

## 2018-10-03 NOTE — Progress Notes (Signed)
Patient ID: Gwendolyn [name], female   DOB: 16-May-1970, 48 y.o.   MRN: [id number]    Virtual Visit via video Note  This visit type was conducted due to national recommendations for restrictions regarding the COVID-19 pandemic (e.g. social distancing).  This format is felt to be most appropriate for this patient at this time.  All issues noted in this document were discussed and addressed.  No physical exam was performed (except for noted visual exam findings with Video Visits).   I connected with [name] today at  8:00 AM EDT by a video enabled telemedicine application and verified that I am speaking with the correct person using two identifiers. Location patient: home Location provider: [facility] Persons participating in the virtual visit: patient, provider  I discussed the limitations, risks, security and privacy concerns of performing an evaluation and management service by video and the availability of in person appointments. I also discussed with the patient that there may be a patient responsible charge related to this service. The patient expressed understanding and agreed to proceed.  HPI:  Patient and I connected via video due to complaints of suspected UTI.  Patient does get UTIs fairly frequently and did drop off urine sample yesterday to be sent to lab for urinalysis with microscopic and also urine culture.  Urine testing results are not yet back.  Patient has been having burning and increased urinary frequency building up for the past 4 to 5 days.  Patient denies seeing any visible blood in her urine, but states in the past urinalysis have showed some red blood cells.  No fever or chills.  No nausea vomiting or diarrhea.  Appetite is normal.  No cough, shortness of breath or wheezing.  No body aches.  ROS: See pertinent positives and negatives per HPI.  Past Medical History:  Diagnosis Date  . Anal fissure   . Anxiety   . Breast cancer ([place])    lumpectomy  . Depression   . Fibromyalgia   . Gallstones   . Irritable bowel syndrome   . Yeast vaginitis     Past Surgical History:  Procedure Laterality Date  . BREAST LUMPECTOMY Right 2003   Radiatiomn Therapy  . CHOLECYSTECTOMY  2000  . COLECTOMY  10/31/2012   SECONDARY TO COLONIC INERTIA  . SUPRACERVICAL ABDOMINAL HYSTERECTOMY  04/10/2011   But still has ovaries.    Family History  Problem Relation Age of Onset  . Meniere's disease Father   . Alcohol abuse Sister   . Heart disease Maternal Grandmother   . Rectal cancer Maternal Grandmother   . Heart disease Maternal Grandfather   . Heart disease Paternal Grandmother   . Heart disease Paternal Grandfather   . Rectal cancer Other        mat great gm  . Colon cancer Neg Hx   . Esophageal cancer Neg Hx     Social History   Tobacco Use  . Smoking status: Former Smoker    Years: 20.00    Quit date: 04/23/2002    Years since quitting: 16.4  . Smokeless tobacco: Never Used  . Tobacco comment: QUIT IN 2004  Substance Use Topics  . Alcohol use: Yes    Alcohol/week: 0.0 standard drinks    Comment: OCCASIONALLY    Current Outpatient Medications:  .  amphetamine-dextroamphetamine (ADDERALL) 30 MG tablet, Take 30 mg by mouth 2 (two) times daily. , Disp: , Rfl: 0 .  cephALEXin (KEFLEX) 500 MG capsule, Take 1 capsule (500 mg  total) by mouth 4 (four) times daily., Disp: 28 capsule, Rfl: 0 .  ciprofloxacin (CIPRO) 250 MG tablet, Take 1 tablet (250 mg total) by mouth 2 (two) times daily., Disp: 14 tablet, Rfl: 1 .  diltiazem 2 % GEL, Apply 1 application topically 2 (two) times daily. X 4-6 weeks, Disp: 60 g, Rfl: 0 .  DULoxetine (CYMBALTA) 60 MG capsule, Take 60 mg by mouth daily., Disp: , Rfl:  .  DULoxetine HCl 40 MG CPEP, , Disp: , Rfl:  .  gabapentin (NEURONTIN) 800 MG tablet, TAKE 1 TABLET (800 MG TOTAL) BY MOUTH NIGHTLY., Disp: 90 tablet, Rfl: 1 .  Ginkgo Biloba 40 MG TABS, Take by mouth., Disp: , Rfl:  .   hydrocortisone (ANUSOL-HC) 25 MG suppository, Place 25 mg rectally 2 (two) times daily as needed for hemorrhoids or anal itching., Disp: , Rfl:  .  Melatonin 3 MG TABS, Take 1 tablet by mouth at bedtime., Disp: , Rfl:  .  metFORMIN (GLUCOPHAGE-XR) 500 MG 24 hr tablet, TAKE 1 TABLET BY MOUTH EVERY DAY WITH BREAKFAST, Disp: 90 tablet, Rfl: 1 .  nystatin (MYCOSTATIN/NYSTOP) powder, Apply topically 2 (two) times daily. To rash until resolved., Disp: 15 g, Rfl: 0 .  tiaGABine (GABITRIL) 4 MG tablet, Takes 4mg  in the AM and 12mg  at bedtime, Disp: , Rfl:  .  zolpidem (AMBIEN) 10 MG tablet, Take 1 tablet (10 mg total) by mouth at bedtime as needed for sleep., Disp: 30 tablet, Rfl: 5  EXAM:  GENERAL: alert, oriented, appears well and in no acute distress  HEENT: atraumatic, conjunttiva clear, no obvious abnormalities on inspection of external nose and ears  NECK: normal movements of the head and neck  LUNGS: on inspection no signs of respiratory distress, breathing rate appears normal, no obvious gross SOB, gasping or wheezing  CV: no obvious cyanosis  MS: moves all visible extremities without noticeable abnormality  PSYCH/NEURO: pleasant and cooperative, no obvious depression or anxiety, speech and thought processing grossly intact  ASSESSMENT AND PLAN:  Discussed the following assessment and plan:  Dysuria/UTI -- patient symptoms do seem consistent with UTI.  Urinalysis and urine culture testing that were collected yesterday are not yet back, so due to it being Friday and concerns of symptoms related to the weekend we will treat prophylactically as a UTI and change antibiotic if needed based on urine culture results.  Patient advised to keep up good water intake, avoid excess sugary caffeinated beverages, wear cotton underwear and always wipe front to back.    I discussed the assessment and treatment plan with the patient. The patient was provided an opportunity to ask questions and all were  answered. The patient agreed with the plan and demonstrated an understanding of the instructions.   The patient was advised to call back or seek an in-person evaluation if the symptoms worsen or if the condition fails to improve as anticipated.  Jodelle Green, FNP

## 2018-10-13 ENCOUNTER — Ambulatory Visit: Payer: BC Managed Care – PPO | Admitting: Internal Medicine

## 2018-11-06 ENCOUNTER — Other Ambulatory Visit: Payer: Self-pay

## 2018-11-06 ENCOUNTER — Ambulatory Visit
Admission: RE | Admit: 2018-11-06 | Discharge: 2018-11-06 | Disposition: A | Discharge status: Home / Self Care | Payer: BC Managed Care – PPO | Source: Ambulatory Visit | Attending: Internal Medicine | Admitting: Internal Medicine

## 2018-11-06 DIAGNOSIS — Z1239 Encounter for other screening for malignant neoplasm of breast: Secondary | ICD-10-CM

## 2018-11-06 DIAGNOSIS — Z1231 Encounter for screening mammogram for malignant neoplasm of breast: Secondary | ICD-10-CM | POA: Insufficient documentation

## 2018-11-06 HISTORY — DX: Personal history of irradiation: Z92.3

## 2018-11-13 ENCOUNTER — Other Ambulatory Visit: Payer: Self-pay | Admitting: Plastic Surgery

## 2018-11-13 HISTORY — PX: REDUCTION MAMMAPLASTY: SUR839

## 2018-12-02 ENCOUNTER — Other Ambulatory Visit: Payer: Self-pay

## 2018-12-04 ENCOUNTER — Ambulatory Visit (INDEPENDENT_AMBULATORY_CARE_PROVIDER_SITE_OTHER): Payer: BC Managed Care – PPO | Admitting: Internal Medicine

## 2018-12-04 ENCOUNTER — Encounter: Payer: Self-pay | Admitting: Internal Medicine

## 2018-12-04 ENCOUNTER — Other Ambulatory Visit: Payer: Self-pay

## 2018-12-04 DIAGNOSIS — F3181 Bipolar II disorder: Secondary | ICD-10-CM | POA: Diagnosis not present

## 2018-12-04 DIAGNOSIS — Z0000 Encounter for general adult medical examination without abnormal findings: Secondary | ICD-10-CM

## 2018-12-04 DIAGNOSIS — D0511 Intraductal carcinoma in situ of right breast: Secondary | ICD-10-CM

## 2018-12-04 NOTE — Progress Notes (Signed)
Patient ID: Gwendolyn [name], female    DOB: 1970-05-29  Age: 48 y.o. MRN: [id number]  The patient is here for annual preventive  examination and management of other chronic and acute problems.    Mammogram normal July 2020. DCIS found during breast reduction 9 days ago by Dr Harlow Mares,  [facility]  Plastic surgery . History of right breast lumpectomy with XRT in 2002 for ER/PR positive CA.  Advised to see gen surg and oncology.  Had appt with Dr Alphonsa Overall .  Treatment plan not decided ,  No XRT  But tamoxifen probable,   The risk factors are reflected in the social history.  The roster of all physicians providing medical care to patient - is listed in the Snapshot section of the chart.  Activities of daily living:  The patient is 100% independent in all ADLs: dressing, toileting, feeding as well as independent mobility  Home safety : The patient has smoke detectors in the home. They wear seatbelts.  There are no firearms at home. There is no violence in the home.   There is no risks for hepatitis, STDs or HIV. There is no   history of blood transfusion. They have no travel history to infectious disease endemic areas of the world.  The patient has seen their dentist in the last six month. They have seen their eye doctor in the last year. They admit to slight hearing difficulty with regard to whispered voices and some television programs.  They have deferred audiologic testing in the last year.  They do not  have excessive sun exposure. Discussed the need for sun protection: hats, long sleeves and use of sunscreen if there is significant sun exposure.   Diet: the importance of a healthy diet is discussed. They do have a healthy diet.  The benefits of regular aerobic exercise were discussed. She walks 4 times per week ,  20 minutes.   Depression screen: there are no signs or vegative symptoms of depression- irritability, change in appetite, anhedonia, sadness/tearfullness.  Cognitive  assessment: the patient manages all their financial and personal affairs and is actively engaged. They could relate day,date,year and events; recalled 2/3 objects at 3 minutes; performed clock-face test normally.  The following portions of the patient's history were reviewed and updated as appropriate: allergies, current medications, past family history, past medical history,  past surgical history, past social history  and problem list.  Visual acuity was not assessed per patient preference since she has regular follow up with her ophthalmologist. Hearing and body mass index were assessed and reviewed.   During the course of the visit the patient was educated and counseled about appropriate screening and preventive services including : fall prevention , diabetes screening, nutrition counseling, colorectal cancer screening, and recommended immunizations.    CC: Diagnoses of Encounter for preventive health examination, Ductal carcinoma in situ (DCIS) of right breast, and Bipolar 2 disorder ([place]) were pertinent to this visit.   1) DCIS right breast new dx.  Alphonsa Overall referral  Done  by plastic surgery.  Breast MRI planned and possible tamoxifen,  No XRT due to h/o XRT . patient undecided ,  Worried about weight gain and loss of sex drive.    2) Intentional weight loss of 15 lbs in 2 months on  the green smoothie diet combined  with exercise.  Has Gained 5 lbs after breast surgery  November 13 2018  .  has been using using metformin  Since July 2019 peak weight  was 194 lb in Nov 2019.has lost 35 lbs to date ,  Goal is 50 lbs ,     [name] has a past medical history of Anal fissure, Anxiety, Breast cancer ([place]) (2002), Depression, Fibromyalgia, Gallstones, Irritable bowel syndrome, Personal history of radiation therapy, and Yeast vaginitis.   She has a past surgical history that includes Cholecystectomy (2000); Supracervical abdominal hysterectomy (04/10/2011); Breast lumpectomy (Right,  2003); Colectomy (10/31/2012); and Breast biopsy (Right, 2002).   Her family history includes Alcohol abuse in her sister; Heart disease in her maternal grandfather, maternal grandmother, paternal grandfather, and paternal grandmother; Meniere's disease in her father; Rectal cancer in her maternal grandmother and another family member.She reports that she quit smoking about 16 years ago. She quit after 20.00 years of use. She has never used smokeless tobacco. She reports current alcohol use. She reports that she does not use drugs.  Outpatient Medications Prior to Visit  Medication Sig Dispense Refill  . amphetamine-dextroamphetamine (ADDERALL) 30 MG tablet Take 30 mg by mouth 2 (two) times daily.   0  . DULoxetine (CYMBALTA) 60 MG capsule Take 60 mg by mouth daily.    Marland Kitchen gabapentin (NEURONTIN) 800 MG tablet TAKE 1 TABLET (800 MG TOTAL) BY MOUTH NIGHTLY. 90 tablet 1  . Ginkgo Biloba 40 MG TABS Take by mouth.    . hydrocortisone (ANUSOL-HC) 25 MG suppository Place 25 mg rectally 2 (two) times daily as needed for hemorrhoids or anal itching.    . Melatonin 3 MG TABS Take 1 tablet by mouth at bedtime.    . metFORMIN (GLUCOPHAGE-XR) 500 MG 24 hr tablet TAKE 1 TABLET BY MOUTH EVERY DAY WITH BREAKFAST 90 tablet 1  . nystatin (MYCOSTATIN/NYSTOP) powder Apply topically 2 (two) times daily. To rash until resolved. 15 g 0  . tiaGABine (GABITRIL) 4 MG tablet Takes 4mg  in the AM and 12mg  at bedtime    . zolpidem (AMBIEN) 10 MG tablet Take 1 tablet (10 mg total) by mouth at bedtime as needed for sleep. 30 tablet 5  . diltiazem 2 % GEL Apply 1 application topically 2 (two) times daily. X 4-6 weeks (Patient not taking: Reported on 12/04/2018) 60 g 0  . cephALEXin (KEFLEX) 500 MG capsule Take 1 capsule (500 mg total) by mouth 4 (four) times daily. (Patient not taking: Reported on 12/04/2018) 28 capsule 0  . ciprofloxacin (CIPRO) 250 MG tablet Take 1 tablet (250 mg total) by mouth 2 (two) times daily. (Patient not  taking: Reported on 12/04/2018) 14 tablet 0  . DULoxetine HCl 40 MG CPEP      No facility-administered medications prior to visit.     Review of Systems   Patient denies headache, fevers, malaise, unintentional weight loss, skin rash, eye pain, sinus congestion and sinus pain, sore throat, dysphagia,  hemoptysis , cough, dyspnea, wheezing, chest pain, palpitations, orthopnea, edema, abdominal pain, nausea, melena, diarrhea, constipation, flank pain, dysuria, hematuria, urinary  Frequency, nocturia, numbness, tingling, seizures,  Focal weakness, Loss of consciousness,  Tremor, insomnia, depression, anxiety, and suicidal ideation.      Objective:  BP 90/62 (BP Location: Right Arm, Patient Position: Sitting, Cuff Size: Normal)   Pulse 81   Temp 98.4 F (36.9 C) (Oral)   Resp 14   Ht 5\' 7"  (1.702 m)   Wt 159 lb 3.2 oz (72.2 kg)   SpO2 96%   BMI 24.93 kg/m   Physical Exam   General appearance: alert, cooperative and appears stated age Head: Normocephalic, without obvious abnormality,  atraumatic Eyes: conjunctivae/corneas clear. PERRL, EOM's intact. Fundi benign. Ears: normal TM's and external ear canals both ears Nose: Nares normal. Septum midline. Mucosa normal. No drainage or sinus tenderness. Throat: lips, mucosa, and tongue normal; teeth and gums normal Neck: no adenopathy, no carotid bruit, no JVD, supple, symmetrical, trachea midline and thyroid not enlarged, symmetric, no tenderness/mass/nodules Lungs: clear to auscultation bilaterally Breasts: healing surgical scars , left breast larger than right.  no masses or tenderness Heart: regular rate and rhythm, S1, S2 normal, no murmur, click, rub or gallop Abdomen: soft, non-tender; bowel sounds normal; no masses,  no organomegaly Extremities: extremities normal, atraumatic, no cyanosis or edema Pulses: 2+ and symmetric Skin: Skin color, texture, turgor normal. No rashes or lesions Neurologic: Alert and oriented X 3, normal  strength and tone. Normal symmetric reflexes. Normal coordination and gait.      Assessment & Plan:   Problem List Items Addressed This Visit      Unprioritized   Bipolar 2 disorder ([place])    With cyclothymia and anxiety.  All psychiatric diagnoses are managed by Johna Roles.  Symptoms are stable      Encounter for preventive health examination    age appropriate education and counseling updated, referrals for preventative services and immunizations addressed, dietary and smoking counseling addressed, most recent labs reviewed.  I have personally reviewed and have noted:  1) the patient's medical and social history 2) The pt's use of alcohol, tobacco, and illicit drugs 3) The patient's current medications and supplements 4) Functional ability including ADL's, fall risk, home safety risk, hearing and visual impairment 5) Diet and physical activities 6) Evidence for depression or mood disorder 7) The patient's height, weight, and BMI have been recorded in the chart  I have made referrals, and provided counseling and education based on review of the above      Ductal carcinoma in situ (DCIS) of right breast    Incidental finding during breast reduction in July 2020,  Missed by MRI 9 days earlier.  May have  Been totally resected.  History of breast cancer s/p lumpectomy and XRT in 2002 right bresat. Tamoxifen trial risks and benefits discussed         I have discontinued Gwendolyn [name]'s DULoxetine HCl, cephALEXin, and ciprofloxacin. I am also having her maintain her tiaGABine, Melatonin, DULoxetine, Ginkgo Biloba, amphetamine-dextroamphetamine, hydrocortisone, diltiazem, zolpidem, metFORMIN, gabapentin, and nystatin.  No orders of the defined types were placed in this encounter.   Medications Discontinued During This Encounter  Medication Reason  . cephALEXin (KEFLEX) 500 MG capsule Completed Course  . ciprofloxacin (CIPRO) 250 MG tablet Completed Course  .  DULoxetine HCl 40 MG CPEP Error    Follow-up: No follow-ups on file.   [name], MD

## 2018-12-06 DIAGNOSIS — D0511 Intraductal carcinoma in situ of right breast: Secondary | ICD-10-CM | POA: Insufficient documentation

## 2018-12-06 DIAGNOSIS — Z8489 Family history of other specified conditions: Secondary | ICD-10-CM | POA: Insufficient documentation

## 2018-12-06 DIAGNOSIS — D0510 Intraductal carcinoma in situ of unspecified breast: Secondary | ICD-10-CM | POA: Insufficient documentation

## 2018-12-06 HISTORY — DX: Family history of other specified conditions: Z84.89

## 2018-12-06 NOTE — Assessment & Plan Note (Signed)
Incidental finding during breast reduction in July 2020,  Missed by MRI 9 days earlier.  May have  Been totally resected.  History of breast cancer s/p lumpectomy and XRT in 2002 right bresat. Tamoxifen trial risks and benefits discussed

## 2018-12-06 NOTE — Assessment & Plan Note (Addendum)
With cyclothymia and anxiety.  All psychiatric diagnoses are managed by Johna Roles.  Symptoms are stable

## 2018-12-06 NOTE — Patient Instructions (Signed)
Health Maintenance, Female Adopting a healthy lifestyle and getting preventive care are important in promoting health and wellness. Ask your health care provider about:  The right schedule for you to have regular tests and exams.  Things you can do on your own to prevent diseases and keep yourself healthy. What should I know about diet, weight, and exercise? Eat a healthy diet   Eat a diet that includes plenty of vegetables, fruits, low-fat dairy products, and lean protein.  Do not eat a lot of foods that are high in solid fats, added sugars, or sodium. Maintain a healthy weight Body mass index (BMI) is used to identify weight problems. It estimates body fat based on height and weight. Your health care provider can help determine your BMI and help you achieve or maintain a healthy weight. Get regular exercise Get regular exercise. This is one of the most important things you can do for your health. Most adults should:  Exercise for at least 150 minutes each week. The exercise should increase your heart rate and make you sweat (moderate-intensity exercise).  Do strengthening exercises at least twice a week. This is in addition to the moderate-intensity exercise.  Spend less time sitting. Even light physical activity can be beneficial. Watch cholesterol and blood lipids Have your blood tested for lipids and cholesterol at 50 years of age, then have this test every 5 years. Have your cholesterol levels checked more often if:  Your lipid or cholesterol levels are high.  You are older than 50 years of age.  You are at high risk for heart disease. What should I know about cancer screening? Depending on your health history and family history, you may need to have cancer screening at various ages. This may include screening for:  Breast cancer.  Cervical cancer.  Colorectal cancer.  Skin cancer.  Lung cancer. What should I know about heart disease, diabetes, and high blood  pressure? Blood pressure and heart disease  High blood pressure causes heart disease and increases the risk of stroke. This is more likely to develop in people who have high blood pressure readings, are of African descent, or are overweight.  Have your blood pressure checked: ? Every 3-5 years if you are 18-39 years of age. ? Every year if you are 40 years old or older. Diabetes Have regular diabetes screenings. This checks your fasting blood sugar level. Have the screening done:  Once every three years after age 40 if you are at a normal weight and have a low risk for diabetes.  More often and at a younger age if you are overweight or have a high risk for diabetes. What should I know about preventing infection? Hepatitis B If you have a higher risk for hepatitis B, you should be screened for this virus. Talk with your health care provider to find out if you are at risk for hepatitis B infection. Hepatitis C Testing is recommended for:  Everyone born from 1945 through 1965.  Anyone with known risk factors for hepatitis C. Sexually transmitted infections (STIs)  Get screened for STIs, including gonorrhea and chlamydia, if: ? You are sexually active and are younger than 50 years of age. ? You are older than 50 years of age and your health care provider tells you that you are at risk for this type of infection. ? Your sexual activity has changed since you were last screened, and you are at increased risk for chlamydia or gonorrhea. Ask your health care provider if   you are at risk.  Ask your health care provider about whether you are at high risk for HIV. Your health care provider may recommend a prescription medicine to help prevent HIV infection. If you choose to take medicine to prevent HIV, you should first get tested for HIV. You should then be tested every 3 months for as long as you are taking the medicine. Pregnancy  If you are about to stop having your period (premenopausal) and  you may become pregnant, seek counseling before you get pregnant.  Take 400 to 800 micrograms (mcg) of folic acid every day if you become pregnant.  Ask for birth control (contraception) if you want to prevent pregnancy. Osteoporosis and menopause Osteoporosis is a disease in which the bones lose minerals and strength with aging. This can result in bone fractures. If you are 65 years old or older, or if you are at risk for osteoporosis and fractures, ask your health care provider if you should:  Be screened for bone loss.  Take a calcium or vitamin D supplement to lower your risk of fractures.  Be given hormone replacement therapy (HRT) to treat symptoms of menopause. Follow these instructions at home: Lifestyle  Do not use any products that contain nicotine or tobacco, such as cigarettes, e-cigarettes, and chewing tobacco. If you need help quitting, ask your health care provider.  Do not use street drugs.  Do not share needles.  Ask your health care provider for help if you need support or information about quitting drugs. Alcohol use  Do not drink alcohol if: ? Your health care provider tells you not to drink. ? You are pregnant, may be pregnant, or are planning to become pregnant.  If you drink alcohol: ? Limit how much you use to 0-1 drink a day. ? Limit intake if you are breastfeeding.  Be aware of how much alcohol is in your drink. In the [place] one drink equals one 12 oz bottle of beer (355 mL), one 5 oz glass of wine (148 mL), or one 1 oz glass of hard liquor (44 mL). General instructions  Schedule regular health, dental, and eye exams.  Stay current with your vaccines.  Tell your health care provider if: ? You often feel depressed. ? You have ever been abused or do not feel safe at home. Summary  Adopting a healthy lifestyle and getting preventive care are important in promoting health and wellness.  Follow your health care provider's instructions about healthy  diet, exercising, and getting tested or screened for diseases.  Follow your health care provider's instructions on monitoring your cholesterol and blood pressure. This information is not intended to replace advice given to you by your health care provider. Make sure you discuss any questions you have with your health care provider. Document Released: 10/23/2010 Document Revised: 04/02/2018 Document Reviewed: 04/02/2018 Elsevier Patient Education  2020 [employer]  

## 2018-12-06 NOTE — Assessment & Plan Note (Signed)

## 2018-12-08 ENCOUNTER — Encounter: Payer: Self-pay | Admitting: Internal Medicine

## 2018-12-08 ENCOUNTER — Other Ambulatory Visit: Payer: BC Managed Care – PPO

## 2018-12-08 ENCOUNTER — Other Ambulatory Visit: Payer: Self-pay

## 2018-12-08 ENCOUNTER — Ambulatory Visit (INDEPENDENT_AMBULATORY_CARE_PROVIDER_SITE_OTHER): Payer: BC Managed Care – PPO | Admitting: Internal Medicine

## 2018-12-08 DIAGNOSIS — N390 Urinary tract infection, site not specified: Secondary | ICD-10-CM | POA: Insufficient documentation

## 2018-12-08 DIAGNOSIS — R300 Dysuria: Secondary | ICD-10-CM | POA: Diagnosis not present

## 2018-12-08 DIAGNOSIS — N3000 Acute cystitis without hematuria: Secondary | ICD-10-CM

## 2018-12-08 DIAGNOSIS — Z9049 Acquired absence of other specified parts of digestive tract: Secondary | ICD-10-CM

## 2018-12-08 MED ORDER — CIPROFLOXACIN HCL 250 MG PO TABS: 250.0000 mg | ORAL_TABLET | Freq: Two times a day (BID) | ORAL | 0 refills | Status: DC

## 2018-12-08 NOTE — Progress Notes (Signed)
Virtual Visit via  [email or web address]  This visit type was conducted due to national recommendations for restrictions regarding the COVID-19 pandemic (e.g. social distancing).  This format is felt to be most appropriate for this patient at this time.  All issues noted in this document were discussed and addressed.  No physical exam was performed (except for noted visual exam findings with Video Visits).   I connected with@ on 12/08/18 at  9:30 AM EDT by a video enabled telemedicine application or telephone and verified that I am speaking with the correct person using two identifiers. Location patient: home Location provider: work or home office Persons participating in the virtual visit: patient, provider  I discussed the limitations, risks, security and privacy concerns of performing an evaluation and management service by telephone and the availability of in person appointments. I also discussed with the patient that there may be a patient responsible charge related to this service. The patient expressed understanding and agreed to proceed. .  Reason for visit: DYSURIA  HPI:  48 YR OLD presents with 2 day history of dysuria . Denies fevers, flank pain and  Nausea.  No  diarrhea.  Has history of total colectomy with ileocolonic anastomosis but stools have been formed,  Not loose since taking my advice to eat a marshmallow daily or tid .   Started taking leftover cipro 250 mg yesterday evening   Has had two doses thus far   ROS: See pertinent positives and negatives per HPI.  Past Medical History:  Diagnosis Date  . Anal fissure   . Anxiety   . Breast cancer ([place]) 2002   lumpectomy-Right  . Depression   . Fibromyalgia   . Gallstones   . Irritable bowel syndrome   . Personal history of radiation therapy   . Yeast vaginitis     Past Surgical History:  Procedure Laterality Date  . BREAST BIOPSY Right 2002   Breast cancer  . BREAST LUMPECTOMY Right 2003   Radiatiomn Therapy  . CHOLECYSTECTOMY   2000  . COLECTOMY  10/31/2012   SECONDARY TO COLONIC INERTIA  . SUPRACERVICAL ABDOMINAL HYSTERECTOMY  04/10/2011   But still has ovaries.    Family History  Problem Relation Age of Onset  . Meniere's disease Father   . Alcohol abuse Sister   . Heart disease Maternal Grandmother   . Rectal cancer Maternal Grandmother   . Heart disease Maternal Grandfather   . Heart disease Paternal Grandmother   . Heart disease Paternal Grandfather   . Rectal cancer Other        mat great gm  . Colon cancer Neg Hx   . Esophageal cancer Neg Hx   . Breast cancer Neg Hx     SOCIAL HX:  reports that she quit smoking about 16 years ago. She quit after 20.00 years of use. She has never used smokeless tobacco. She reports current alcohol use. She reports that she does not use drugs.   Current Outpatient Medications:  .  amphetamine-dextroamphetamine (ADDERALL) 30 MG tablet, Take 30 mg by mouth 2 (two) times daily. , Disp: , Rfl: 0 .  diltiazem 2 % GEL, Apply 1 application topically 2 (two) times daily. X 4-6 weeks, Disp: 60 g, Rfl: 0 .  DULoxetine (CYMBALTA) 60 MG capsule, Take 60 mg by mouth daily., Disp: , Rfl:  .  gabapentin (NEURONTIN) 800 MG tablet, TAKE 1 TABLET (800 MG TOTAL) BY MOUTH NIGHTLY., Disp: 90 tablet, Rfl: 1 .  Ginkgo Biloba 40 MG TABS,  Take by mouth., Disp: , Rfl:  .  hydrocortisone (ANUSOL-HC) 25 MG suppository, Place 25 mg rectally 2 (two) times daily as needed for hemorrhoids or anal itching., Disp: , Rfl:  .  Melatonin 3 MG TABS, Take 1 tablet by mouth at bedtime., Disp: , Rfl:  .  metFORMIN (GLUCOPHAGE-XR) 500 MG 24 hr tablet, TAKE 1 TABLET BY MOUTH EVERY DAY WITH BREAKFAST, Disp: 90 tablet, Rfl: 1 .  nystatin (MYCOSTATIN/NYSTOP) powder, Apply topically 2 (two) times daily. To rash until resolved., Disp: 15 g, Rfl: 0 .  tiaGABine (GABITRIL) 4 MG tablet, Takes 4mg  in the AM and 12mg  at bedtime, Disp: , Rfl:  .  zolpidem (AMBIEN) 10 MG tablet, Take 1 tablet (10 mg total) by mouth at  bedtime as needed for sleep., Disp: 30 tablet, Rfl: 5 .  ciprofloxacin (CIPRO) 250 MG tablet, Take 1 tablet (250 mg total) by mouth 2 (two) times daily., Disp: 10 tablet, Rfl: 0  EXAM:  VITALS per patient if applicable:  GENERAL: alert, oriented, appears well and in no acute distress  HEENT: atraumatic, conjunttiva clear, no obvious abnormalities on inspection of external nose and ears  NECK: normal movements of the head and neck  LUNGS: on inspection no signs of respiratory distress, breathing rate appears normal, no obvious gross SOB, gasping or wheezing  CV: no obvious cyanosis  MS: moves all visible extremities without noticeable abnormality  PSYCH/NEURO: pleasant and cooperative, no obvious depression or anxiety, speech and thought processing grossly intact  ASSESSMENT AND PLAN:  S/P colectomy Due to her post surgical anatomy she has had chronic unformed stools since surgery,  This has improved with daily ingestion of marshmallow.   UTI (urinary tract infection) She began cipro 250 mg yesteray evening,  Has had 2 doses.  Continue cipro,  Check culture in 48 hours if symptoms not resolved.  No probiotic needed since she has no colon     I discussed the assessment and treatment plan with the patient. The patient was provided an opportunity to ask questions and all were answered. The patient agreed with the plan and demonstrated an understanding of the instructions.   The patient was advised to call back or seek an in-person evaluation if the symptoms worsen or if the condition fails to improve as anticipated.  I provided 15 minutes of non-face-to-face time during this encounter.   [name], MD

## 2018-12-08 NOTE — Assessment & Plan Note (Signed)
She began cipro 250 mg yesteray evening,  Has had 2 doses.  Continue cipro,  Check culture in 48 hours if symptoms not resolved.  No probiotic needed since she has no colon

## 2018-12-08 NOTE — Assessment & Plan Note (Signed)
Due to her post surgical anatomy she has had chronic unformed stools since surgery,  This has improved with daily ingestion of marshmallow.  

## 2018-12-08 NOTE — Telephone Encounter (Signed)
Pt is scheduled for both a lab appt and a virtual visit with Dr. Derrel Nip for today.

## 2018-12-10 ENCOUNTER — Telehealth: Payer: Self-pay | Admitting: Radiation Oncology

## 2018-12-10 NOTE — Telephone Encounter (Signed)
New message: ° ° °LVM for patient to return call to schedule appt from referral received. °

## 2018-12-11 ENCOUNTER — Telehealth: Payer: Self-pay | Admitting: Radiation Oncology

## 2018-12-11 NOTE — Telephone Encounter (Signed)
New message: ° ° °LVM for patient to return call to schedule appt from referral received. °

## 2018-12-12 ENCOUNTER — Telehealth: Payer: Self-pay | Admitting: Hematology

## 2018-12-12 NOTE — Telephone Encounter (Signed)
Received a new patient referral from Dr. Lucia Gaskins at [facility] for breast cancer. Gwendolyn [name] has been cld and scheduled to see Dr. Burr Medico on 9/3 at 3pm. She's been made aware to arrive 15 minutes early.

## 2018-12-18 NOTE — Progress Notes (Signed)
Location of Breast Cancer: Right Breast  Histology per Pathology Report:  11/13/18 Diagnosis 1. Breast, Mammoplasty, right tissue, 76g - DUCTAL CARCINOMA IN SITU. 2. Breast, Mammoplasty, left tissue, 212g - BENIGN BREAST TISSUE.  Receptor Status: ER(95%), PR (95%)  Did patient present with symptoms or was this found on screening mammography?: Found during breast reduction surgery.   Past/Anticipated interventions by surgeon, if any: 12/03/18 Dr. Lucia Gaskins   Past/Anticipated interventions by medical oncology, if any:  Dr. Burr Medico 12/25/18 PLAN:  -Genetic referral, she will make a decision about bilateral mastectomy based on the genetic test results -F/u open, as needed    Lymphedema issues, if any:   N/A  Pain issues, if any: She denies.    SAFETY ISSUES:  Prior radiation? Yes, Right Breast 2002 in [place].    Pacemaker/ICD? No  Possible current pregnancy? Hx of hysterectomy  Is the patient on methotrexate? No  Current Complaints / other details:      [name], [name], RN 12/18/2018,10:04 AM

## 2018-12-22 NOTE — Progress Notes (Signed)
[facility]   [phone] Fax:([phone]) [phone]   Clinic New Consult Note   Patient Care Team: [name], MD as PCP - General (Internal Medicine)  Date of Service:  12/25/2018   CHIEF COMPLAINTS/PURPOSE OF CONSULTATION:  Ductal carcinoma in situ (DCIS) of right breast  REFERRING PHYSICIAN:  Dr Lucia Gaskins  Oncology History Overview Note  Cancer Staging No matching staging information was found for the patient.    Ductal carcinoma in situ (DCIS) of right breast  11/06/2018 Mammogram   Mammogram 11/06/18  IMPRESSION: No mammographic evidence of malignancy. A result letter of this screening mammogram will be mailed directly to the patient.   11/13/2018 Surgery   B/l Breast Reduction   11/13/2018 Pathology Results    Diagnosis 11/13/18 1. Breast, Mammoplasty, right tissue, 76g - DUCTAL CARCINOMA IN SITU. 0.4cm 2. Breast, Mammoplasty, left tissue, 212g - BENIGN BREAST TISSUE.  Estrogen Receptor: 95%, POSITIVE, STRONG STAINING INTENSITY Progesterone Receptor: 95%, POSITIVE, STRONG STAINING INTENSITY   12/06/2018 Initial Diagnosis   Ductal carcinoma in situ (DCIS) of right breast      HISTORY OF PRESENTING ILLNESS:  Gwendolyn [name] 48 y.o. female is a here because of right breast DCIS. The patient was referred by Dr Lucia Gaskins. The patient presents to the clinic today alone.  She first had right DCIS at age [age]. She first had lump in her left breast but was found to be being and found to have right breast DCIS. She had genetic testing 7 years ago when she had hysterectomy due to heavy bleeding. She denies having any symptoms and her hormonal levels were premenopausal the last time it was checked.  She notes her 10/2018 Mammogram was normal but when she went for her b/l breast reduction days later she had early DCIS of the right breast.   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 She is married with two 18 yo children. She quit smoking 1ppd after 24  year in 2004. They have a PMHx of Fibromyalgia. She was on Metformin for weight loss directed by her PCP. 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 She notes she has soft stool 8 times a day. This is well controlled. She had cholecystectomy in her 38s. She denies family history of cancer but there is heart disease and HTN.    REVIEW OF SYSTEMS:    Constitutional: Denies fevers, chills or abnormal night sweats Eyes: Denies blurriness of vision, double vision or watery eyes Ears, nose, mouth, throat, and face: Denies mucositis or sore throat Respiratory: Denies cough, dyspnea or wheezes Cardiovascular: Denies palpitation, chest discomfort or lower extremity swelling Gastrointestinal:  Denies nausea, heartburn or change in bowel habits Skin: Denies abnormal skin rashes Lymphatics: Denies new lymphadenopathy or easy bruising Neurological:Denies numbness, tingling or new weaknesses Behavioral/Psych: Mood is stable, no new changes  All other systems were reviewed with the patient and are negative.  MEDICAL HISTORY:  Past Medical History:  Diagnosis Date  . Anal fissure   . Anxiety   . Breast cancer ([place]) 2002   lumpectomy-Right  . Depression   . Fibromyalgia   . Gallstones   . Irritable bowel syndrome   . Personal history of radiation therapy   . Yeast vaginitis     SURGICAL HISTORY: Past Surgical History:  Procedure Laterality Date  . ABDOMINAL HYSTERECTOMY    . BREAST BIOPSY Right 2002   Breast cancer  . BREAST LUMPECTOMY Right 2003   Radiatiomn Therapy  . CHOLECYSTECTOMY  2000  .  COLECTOMY  10/31/2012   SECONDARY TO COLONIC INERTIA  . SUPRACERVICAL ABDOMINAL HYSTERECTOMY  04/10/2011   But still has ovaries.    SOCIAL HISTORY: Social History   Socioeconomic History  . Marital status: Married    Spouse name: Not on file  . Number of children: 2  . Years of education: Not on file  . Highest education level: Not on file  Occupational History  . Occupation:  Pharmacist, hospital  Social Needs  . Financial resource strain: Not on file  . Food insecurity    Worry: Not on file    Inability: Not on file  . Transportation needs    Medical: Not on file    Non-medical: Not on file  Tobacco Use  . Smoking status: Former Smoker    Packs/day: 1.00    Years: 20.00    Pack years: 20.00    Quit date: 04/23/2002    Years since quitting: 16.6  . Smokeless tobacco: Never Used  . Tobacco comment: QUIT IN 2004  Substance and Sexual Activity  . Alcohol use: Yes    Alcohol/week: 0.0 standard drinks    Comment: OCCASIONALLY  . Drug use: No  . Sexual activity: Yes    Birth control/protection: Surgical  Lifestyle  . Physical activity    Days per week: Not on file    Minutes per session: Not on file  . Stress: Not on file  Relationships  . Social Herbalist on phone: Not on file    Gets together: Not on file    Attends religious service: Not on file    Active member of club or organization: Not on file    Attends meetings of clubs or organizations: Not on file    Relationship status: Not on file  . Intimate partner violence    Fear of current or ex partner: Not on file    Emotionally abused: Not on file    Physically abused: Not on file    Forced sexual activity: Not on file  Other Topics Concern  . Not on file  Social History Narrative   Married from [place]: Family History  Problem Relation Age of Onset  . Meniere's disease Father   . Alcohol abuse Sister   . Heart disease Maternal Grandmother   . Rectal cancer Maternal Grandmother   . Heart disease Maternal Grandfather   . Heart disease Paternal Grandmother   . Heart disease Paternal Grandfather   . Rectal cancer Other        mat great gm  . Colon cancer Neg Hx   . Esophageal cancer Neg Hx   . Breast cancer Neg Hx     ALLERGIES:  is allergic to amoxicillin-pot clavulanate; codeine; morphine; and penicillins.  MEDICATIONS:  Current Outpatient Medications   Medication Sig Dispense Refill  . amphetamine-dextroamphetamine (ADDERALL) 30 MG tablet Take 30 mg by mouth 2 (two) times daily.   0  . ciprofloxacin (CIPRO) 250 MG tablet Take 1 tablet (250 mg total) by mouth 2 (two) times daily. 10 tablet 0  . DULoxetine (CYMBALTA) 60 MG capsule Take 60 mg by mouth daily.    Marland Kitchen gabapentin (NEURONTIN) 800 MG tablet TAKE 1 TABLET (800 MG TOTAL) BY MOUTH NIGHTLY. 90 tablet 1  . Ginkgo Biloba 40 MG TABS Take by mouth.    . hydrocortisone (ANUSOL-HC) 25 MG suppository Place 25 mg rectally 2 (two) times daily as needed for hemorrhoids or anal itching.    Marland Kitchen  Melatonin 3 MG TABS Take 1 tablet by mouth at bedtime.    . metFORMIN (GLUCOPHAGE-XR) 500 MG 24 hr tablet TAKE 1 TABLET BY MOUTH EVERY DAY WITH BREAKFAST 90 tablet 1  . tiaGABine (GABITRIL) 4 MG tablet Takes 4mg  in the AM and 12mg  at bedtime    . zolpidem (AMBIEN) 10 MG tablet Take 1 tablet (10 mg total) by mouth at bedtime as needed for sleep. 30 tablet 5  . nystatin (MYCOSTATIN/NYSTOP) powder Apply topically 2 (two) times daily. To rash until resolved. 15 g 0   No current facility-administered medications for this visit.     PHYSICAL EXAMINATION: ECOG PERFORMANCE STATUS: 0 - Asymptomatic  Vitals:   12/25/18 1520  BP: 105/72  Pulse: 94  Resp: 18  Temp: 98.5 F (36.9 C)  SpO2: 100%   Filed Weights   12/25/18 1520  Weight: 159 lb 14.4 oz (72.5 kg)    GENERAL:alert, no distress and comfortable SKIN: skin color, texture, turgor are normal, no rashes or significant lesions EYES: normal, Conjunctiva are pink and non-injected, sclera clear  NECK: supple, thyroid normal size, non-tender, without nodularity LYMPH:  no palpable lymphadenopathy in the cervical, axillary  LUNGS: clear to auscultation and percussion with normal breathing effort HEART: regular rate & rhythm and no murmurs and no lower extremity edema ABDOMEN:abdomen soft, non-tender and normal bowel sounds Musculoskeletal:no cyanosis of  digits and no clubbing  NEURO: alert & oriented x 3 with fluent speech, no focal motor/sensory deficits BREAST: S/p right lumpectomy and b/l breast reduction: Surgical incisions healed well. No palpable mass, nodules or adenopathy bilaterally. Breast exam benign.  LABORATORY DATA:  I have reviewed the data as listed CBC Latest Ref Rng & Units 03/29/2018 03/28/2018 03/26/2018  WBC 4.0 - 10.5 K/uL 7.7 10.5 8.0  Hemoglobin 12.0 - 15.0 g/dL 12.5 13.3 12.9  Hematocrit 36.0 - 46.0 % 40.1 41.7 39.1  Platelets 150 - 400 K/uL 267 289 303    CMP Latest Ref Rng & Units 06/05/2018 03/26/2018 03/06/2018  Glucose 70 - 99 mg/dL 96 100(H) 108(H)  BUN 6 - 23 mg/dL 11 15 13   Creatinine 0.40 - 1.20 mg/dL 0.81 0.87 0.95  Sodium 135 - 145 mEq/L 136 141 140  Potassium 3.5 - 5.1 mEq/L 4.2 4.0 4.1  Chloride 96 - 112 mEq/L 103 105 101  CO2 19 - 32 mEq/L 23 27 32  Calcium 8.4 - 10.5 mg/dL 9.4 8.8(L) 9.5  Total Protein 6.0 - 8.3 g/dL 7.3 6.8 6.9  Total Bilirubin 0.2 - 1.2 mg/dL 0.6 0.4 0.3  Alkaline Phos 39 - 117 U/L 29(L) 31(L) 33(L)  AST 0 - 37 U/L 17 19 16   ALT 0 - 35 U/L 13 14 14      RADIOGRAPHIC STUDIES: I have personally reviewed the radiological images as listed and agreed with the findings in the report. No results found.  ASSESSMENT & PLAN:  Gwendolyn [name] is a 48 y.o. 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   -Underwent b/l breast reduction on 11/13/18. 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 This was completely surgically removed. -Her DCIS was cured by complete surgery alone.  Any  form of adjuvant therapy is preventive. -We reviewed her risk of another breast cancer by [facility] calculator.  Her 10-year risk of future breast cancer is 19%, which will be reduced by with radiation, or antiestrogen therapy.  -We discussed the option of bilateral prophylactic prevent future breast cancer, which was her initial plan. Due to her very young age at the onset of her breast cancer, will refer her to genetics, if positive, we will recommend bilateral ectomy. -if she does not proceed with bilateral mastectomy, we discussed breast cancer screening and risk reduction strategies -Due to her high risk of future breast cancer, I strongly recommend annual mammogram and breast MRI, 6 months apart.  Since her recent DCIS was not detected by mammogram, she should breast MRI in the next 2 to 3 months to rule out additional disease.  -She has had right breast radiation in the past and another radiation therapy is not recommended, but will discuss this with Rad Onc tomorrow.  -Given her strongly positive ER and PR and premenopausal status, I discussed the benefit of antiestrogen therapy with Tamoxifen for 5 years.   -The potential side effects, which includes but not limited to, hot flash, skin and vaginal dryness, slightly increased risk of cardiovascular disease and cataract, small risk of thrombosis and endometrial cancer, were discussed with her in great details.  She has had a hysterectomy, no risk of endometrial cancer. -Given she is apprehensive of the side effects of Tamoxifen she rather proceed with cancer screening alone with yearly mammogram and breast MRI, regular breast exam and labs. This is understandable.  -We also discussed other healthy lifestyle to reduce her risk of breast cancer in the future, including healthy balanced diet, regular exercise and stable positive mood and outlook, and maintain weight.  -F/u open    2. Genetic Testing  -She does not have a family  history of cancer but this is her second DCIS in the same breast. She is eligible for genetic testing.  -She did have testing 7 years ago in [place], [place], but is interested in further testing. I will refer her.  -If genetics positive she will likely proceed with b/l mastectomy, otherwise she will proceed with yearly screenings.   3. 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    PLAN:  -Genetic referral, she will make a decision about bilateral mastectomy based on the genetic test results -F/u open, as needed    Orders Placed This Encounter  Procedures  . Ambulatory referral to Genetics    Referral Priority:   Urgent    Referral Type:   Consultation    Referral Reason:   Specialty Services Required    Number of Visits Requested:   1    All questions were answered. The patient knows to call the clinic with any problems, questions or concerns. I spent 30 minutes counseling the patient face to face. The total time spent in the appointment was 45 minutes and more than 50% was on counseling.     Truitt Merle, MD 12/25/2018 4:34 PM  I, Joslyn Devon, am acting as scribe for Truitt Merle, MD.   I have reviewed the above documentation for accuracy and completeness, and I agree with the above.

## 2018-12-25 ENCOUNTER — Inpatient Hospital Stay: Payer: BC Managed Care – PPO | Attending: Hematology | Admitting: Hematology

## 2018-12-25 ENCOUNTER — Other Ambulatory Visit: Payer: Self-pay

## 2018-12-25 ENCOUNTER — Encounter: Payer: Self-pay | Admitting: Hematology

## 2018-12-25 VITALS — BP 105/72 | HR 94 | Temp 98.5°F | Resp 18 | Ht 67.0 in | Wt 159.9 lb

## 2018-12-25 DIAGNOSIS — Z923 Personal history of irradiation: Secondary | ICD-10-CM

## 2018-12-25 DIAGNOSIS — Z800 Family history of malignant neoplasm of digestive organs: Secondary | ICD-10-CM

## 2018-12-25 DIAGNOSIS — Z87891 Personal history of nicotine dependence: Secondary | ICD-10-CM

## 2018-12-25 DIAGNOSIS — Z90710 Acquired absence of both cervix and uterus: Secondary | ICD-10-CM | POA: Diagnosis not present

## 2018-12-25 DIAGNOSIS — Z9049 Acquired absence of other specified parts of digestive tract: Secondary | ICD-10-CM | POA: Insufficient documentation

## 2018-12-25 DIAGNOSIS — D0511 Intraductal carcinoma in situ of right breast: Secondary | ICD-10-CM

## 2018-12-25 DIAGNOSIS — Z170 Estrogen receptor positive status [ER+]: Secondary | ICD-10-CM | POA: Diagnosis not present

## 2018-12-25 NOTE — Progress Notes (Signed)
Radiation Oncology         [phone] ________________________________  Initial outpatient WebEx Consultation due to pandemic precautions  Name: Gwendolyn [name] MRN: [id number]  Date: 12/26/2018  DOB: 09-30-1970  OA:CZYSA, Aris Everts, MD  Alphonsa Overall, MD   REFERRING PHYSICIAN: Alphonsa Overall, MD  DIAGNOSIS:    ICD-10-CM   1. Ductal carcinoma in situ (DCIS) of right breast  D05.11     Right Breast DCIS, ER+ / PR+, Grade 2  CHIEF COMPLAINT: Here to discuss management of right breast DCIS  HISTORY OF PRESENT ILLNESS::Gwendolyn [name] is a 48 y.o. female who has a history of ER+ right breast DCIS in 2002. She underwent lumpectomy and radiation therapy in [place]. She did not take antiestrogens.   She presented to Dr. Harlow Mares to discuss breast reduction surgery to restore some symmetry between her breasts. She underwent mammoplasty on 11/13/2018. Pathology from the procedure showed intermediate grade DCIS, measuring 0.4 cm, ER+/PR+.  I have contacted pathology to clarify if her margins are negative.  Of note, she underwent screening mammogram on 11/06/2018 with negative results. She also reports she underwent genetic testing approximately 7 years ago prior to her hysterectomy.  She has met with Dr. Lucia Gaskins and Dr. Burr Medico. Per Dr. Ernestina Penna note, the patient has a 10-year risk of future breast cancer of 19%. She will proceed with repeat genetic testing. Depending on the results, she anticipates undergoing bilateral mastectomies if positive.  She has been kindly referred to me to discuss additional radiation therapy. She has already had radiation directed to the right breast.  She is leaning against antiestrogen therapy as she doesn't want hormonal changes that send her into early menopause.  However she is open minded about this.  She is more enthusiastic about starting antiestrogens after she naturally enters menopause.  PREVIOUS RADIATION THERAPY: Yes  2002: Right Breast  [facility]) -- she recalls getting 36 treatments.  [place].  PAST MEDICAL HISTORY:  has a past medical history of Anal fissure, Anxiety, Breast cancer ([place]) (2002), Depression, Fibromyalgia, Gallstones, Irritable bowel syndrome, Personal history of radiation therapy, and Yeast vaginitis.    PAST SURGICAL HISTORY: Past Surgical History:  Procedure Laterality Date  . ABDOMINAL HYSTERECTOMY    . BREAST BIOPSY Right 2002   Breast cancer  . BREAST LUMPECTOMY Right 2003   Radiatiomn Therapy  . CHOLECYSTECTOMY  2000  . COLECTOMY  10/31/2012   SECONDARY TO COLONIC INERTIA  . SUPRACERVICAL ABDOMINAL HYSTERECTOMY  04/10/2011   But still has ovaries.    FAMILY HISTORY: family history includes Alcohol abuse in her sister; Heart disease in her maternal grandfather, maternal grandmother, paternal grandfather, and paternal grandmother; Meniere's disease in her father; Rectal cancer in her maternal grandmother and another family member.  SOCIAL HISTORY:  reports that she quit smoking about 16 years ago. She has a 20.00 pack-year smoking history. She has never used smokeless tobacco. She reports current alcohol use. She reports that she does not use drugs.  ALLERGIES: Amoxicillin-pot clavulanate, Morphine, and Penicillins  MEDICATIONS:  Current Outpatient Medications  Medication Sig Dispense Refill  . amphetamine-dextroamphetamine (ADDERALL) 30 MG tablet Take 30 mg by mouth 2 (two) times daily.   0  . ciprofloxacin (CIPRO) 250 MG tablet Take 1 tablet (250 mg total) by mouth 2 (two) times daily. 10 tablet 0  . DULoxetine (CYMBALTA) 60 MG capsule Take 60 mg by mouth daily.    Marland Kitchen gabapentin (NEURONTIN) 800 MG tablet TAKE 1 TABLET (800 MG TOTAL) BY MOUTH  NIGHTLY. 90 tablet 1  . Ginkgo Biloba 40 MG TABS Take by mouth.    . hydrocortisone (ANUSOL-HC) 25 MG suppository Place 25 mg rectally 2 (two) times daily as needed for hemorrhoids or anal itching.    . Melatonin 3 MG TABS Take 1  tablet by mouth at bedtime.    . metFORMIN (GLUCOPHAGE-XR) 500 MG 24 hr tablet TAKE 1 TABLET BY MOUTH EVERY DAY WITH BREAKFAST 90 tablet 1  . tiaGABine (GABITRIL) 4 MG tablet Takes '4mg'$  in the AM and '12mg'$  at bedtime    . zolpidem (AMBIEN) 10 MG tablet Take 1 tablet (10 mg total) by mouth at bedtime as needed for sleep. 30 tablet 5  . nystatin (MYCOSTATIN/NYSTOP) powder Apply topically 2 (two) times daily. To rash until resolved. 15 g 0   No current facility-administered medications for this encounter.     REVIEW OF SYSTEMS: As above  PHYSICAL EXAM:  vitals were not taken for this visit.   General: Alert and oriented, in no acute distress      LABORATORY DATA:  Lab Results  Component Value Date   WBC 7.7 03/29/2018   HGB 12.5 03/29/2018   HCT 40.1 03/29/2018   MCV 95.5 03/29/2018   PLT 267 03/29/2018   CMP     Component Value Date/Time   NA 136 06/05/2018 0856   NA 138 10/02/2017 0718   K 4.2 06/05/2018 0856   CL 103 06/05/2018 0856   CO2 23 06/05/2018 0856   GLUCOSE 96 06/05/2018 0856   BUN 11 06/05/2018 0856   BUN 15 12/31/2017 1325   CREATININE 0.81 06/05/2018 0856   CALCIUM 9.4 06/05/2018 0856   PROT 7.3 06/05/2018 0856   PROT 6.7 10/02/2017 0718   ALBUMIN 4.4 06/05/2018 0856   ALBUMIN 4.6 10/02/2017 0718   AST 17 06/05/2018 0856   ALT 13 06/05/2018 0856   ALKPHOS 29 (L) 06/05/2018 0856   BILITOT 0.6 06/05/2018 0856   BILITOT 0.5 10/02/2017 0718   GFRNONAA >60 03/26/2018 1243   GFRAA >60 03/26/2018 1243        RADIOGRAPHY: As above   IMPRESSION/PLAN: DCIS of the right breast, 18 years after lumpectomy and radiotherapy for DCIS of the right breast.  Her DCIS is ER positive and intermediate grade and was recently found at mammoplasty  I have contacted pathology to clarify her margin status  I do not recommend repeat radiotherapy to the right breast.  This is high risk and I do not think that the risks are outweighed by the potential benefits.  We talked  about the risk of tissue injury in the setting of reirradiation of the right breast.  She is in agreement with foregoing repeat radiation.  She is wondering when her consultation will be with genetic counseling.  I asked my scheduler to look into this for her.  I wished her the best and let her know to call me if she has any questions in the future.  She will discuss the options of further surgery and antiestrogen therapy with her physicians after results from genetic counseling are available.   This encounter was provided by telemedicine platform Webex.  The patient has given verbal consent for this type of encounter and has been advised to only accept a meeting of this type in a secure network environment. The time spent during this encounter was 20 minutes. The attendants for this meeting include Eppie Gibson  and [name].  During the encounter, Eppie Gibson was located at  [place] [facility].  Gwendolyn [name] was located at home.   __________________________________________   Eppie Gibson, MD   This document serves as a record of services personally performed by Eppie Gibson, MD. It was created on her behalf by Wilburn Mylar, a trained medical scribe. The creation of this record is based on the scribe's personal observations and the provider's statements to them. This document has been checked and approved by the attending provider.

## 2018-12-26 ENCOUNTER — Other Ambulatory Visit: Payer: Self-pay

## 2018-12-26 ENCOUNTER — Telehealth: Payer: Self-pay | Admitting: Hematology

## 2018-12-26 ENCOUNTER — Encounter: Payer: Self-pay | Admitting: Radiation Oncology

## 2018-12-26 ENCOUNTER — Ambulatory Visit
Admission: RE | Admit: 2018-12-26 | Discharge: 2018-12-26 | Disposition: A | Discharge status: Home / Self Care | Payer: BC Managed Care – PPO | Source: Ambulatory Visit | Attending: Radiation Oncology | Admitting: Radiation Oncology

## 2018-12-26 ENCOUNTER — Encounter: Payer: Self-pay | Admitting: *Deleted

## 2018-12-26 DIAGNOSIS — D0511 Intraductal carcinoma in situ of right breast: Secondary | ICD-10-CM

## 2018-12-26 NOTE — Telephone Encounter (Signed)
Attempted to call pt. Mail box is full.  

## 2018-12-26 NOTE — Telephone Encounter (Signed)
Scheduled appt per 9/3 los.  Called patient and left a voice message of the appt date and time.

## 2018-12-30 ENCOUNTER — Encounter: Payer: Self-pay | Admitting: *Deleted

## 2018-12-31 ENCOUNTER — Other Ambulatory Visit: Payer: Self-pay

## 2018-12-31 ENCOUNTER — Encounter: Payer: Self-pay | Admitting: Genetic Counselor

## 2018-12-31 ENCOUNTER — Inpatient Hospital Stay (HOSPITAL_BASED_OUTPATIENT_CLINIC_OR_DEPARTMENT_OTHER): Payer: BC Managed Care – PPO | Admitting: Genetic Counselor

## 2018-12-31 ENCOUNTER — Other Ambulatory Visit: Payer: Self-pay | Admitting: Genetic Counselor

## 2018-12-31 DIAGNOSIS — Z800 Family history of malignant neoplasm of digestive organs: Secondary | ICD-10-CM | POA: Diagnosis not present

## 2018-12-31 DIAGNOSIS — D0511 Intraductal carcinoma in situ of right breast: Secondary | ICD-10-CM

## 2018-12-31 DIAGNOSIS — Z853 Personal history of malignant neoplasm of breast: Secondary | ICD-10-CM

## 2018-12-31 DIAGNOSIS — Z808 Family history of malignant neoplasm of other organs or systems: Secondary | ICD-10-CM | POA: Insufficient documentation

## 2018-12-31 NOTE — Progress Notes (Addendum)
REFERRING PROVIDER: Truitt Merle, MD [address] [place],  [place] [address]  PRIMARY PROVIDER:  [name], MD  PRIMARY REASON FOR VISIT:  1. Ductal carcinoma in situ (DCIS) of right breast   2. Family history of brain cancer   3. Family history of colon cancer   4. History of breast cancer      HISTORY OF PRESENT ILLNESS:   I connected with Gwendolyn [name] on 12/31/2018 at 11 AM EDT by Webex video conference and verified that I am speaking with the correct person using two identifiers.   Patient location: Home Provider location: Work  Gwendolyn [name], a 48 y.o. female, was seen for a [place] cancer genetics consultation at the request of Dr. Burr Medico due to a personal and family history of cancer.  Gwendolyn [name] presents to clinic today to discuss the possibility of a hereditary predisposition to cancer, genetic testing, and to further clarify her future cancer risks, as well as potential cancer risks for family members.   In 2002, at the age of 34, Gwendolyn [name] was diagnosed with DCIS of the right breast. The treatment plan lumpectomy.  In 2020, at the age of 50, Gwendolyn [name] was diagnosed with DCIS of the right breast again after a breast reduction.     CANCER HISTORY:  Oncology History Overview Note  Cancer Staging No matching staging information was found for the patient.    Ductal carcinoma in situ (DCIS) of right breast  11/06/2018 Mammogram   Mammogram 11/06/18  IMPRESSION: No mammographic evidence of malignancy. A result letter of this screening mammogram will be mailed directly to the patient.   11/13/2018 Surgery   B/l Breast Reduction   11/13/2018 Pathology Results    Diagnosis 11/13/18 1. Breast, Mammoplasty, right tissue, 76g - DUCTAL CARCINOMA IN SITU. 0.4cm 2. Breast, Mammoplasty, left tissue, 212g - BENIGN BREAST TISSUE.  Estrogen Receptor: 95%, POSITIVE, STRONG STAINING INTENSITY Progesterone Receptor: 95%, POSITIVE, STRONG  STAINING INTENSITY   12/06/2018 Initial Diagnosis   Ductal carcinoma in situ (DCIS) of right breast      Past Medical History:  Diagnosis Date  . Anal fissure   . Anxiety   . Breast cancer ([place]) 2002   lumpectomy-Right  . Depression   . Family history of brain cancer   . Family history of colon cancer   . Fibromyalgia   . Gallstones   . Irritable bowel syndrome   . Personal history of radiation therapy   . Yeast vaginitis     Past Surgical History:  Procedure Laterality Date  . ABDOMINAL HYSTERECTOMY    . BREAST BIOPSY Right 2002   Breast cancer  . BREAST LUMPECTOMY Right 2003   Radiatiomn Therapy  . CHOLECYSTECTOMY  2000  . COLECTOMY  10/31/2012   SECONDARY TO COLONIC INERTIA  . SUPRACERVICAL ABDOMINAL HYSTERECTOMY  04/10/2011   But still has ovaries.    Social History   Socioeconomic History  . Marital status: Married    Spouse name: Not on file  . Number of children: 2  . Years of education: Not on file  . Highest education level: Not on file  Occupational History  . Occupation: Pharmacist, hospital  Social Needs  . Financial resource strain: Not on file  . Food insecurity    Worry: Not on file    Inability: Not on file  . Transportation needs    Medical: No    Non-medical: No  Tobacco Use  . Smoking status: Former Smoker  Packs/day: 1.00    Years: 20.00    Pack years: 20.00    Quit date: 04/23/2002    Years since quitting: 16.7  . Smokeless tobacco: Never Used  . Tobacco comment: QUIT IN 2004  Substance and Sexual Activity  . Alcohol use: Yes    Alcohol/week: 0.0 standard drinks    Comment: OCCASIONALLY  . Drug use: No  . Sexual activity: Yes    Birth control/protection: Surgical  Lifestyle  . Physical activity    Days per week: Not on file    Minutes per session: Not on file  . Stress: Not on file  Relationships  . Social Herbalist on phone: Not on file    Gets together: Not on file    Attends religious service: Not on file    Active  member of club or organization: Not on file    Attends meetings of clubs or organizations: Not on file    Relationship status: Not on file  Other Topics Concern  . Not on file  Social History Narrative   Married from [place]:  We obtained a detailed, 4-generation family history.  Significant diagnoses are listed below: Family History  Problem Relation Age of Onset  . Meniere's disease Father   . Alcohol abuse Sister   . Heart disease Maternal Grandmother   . Heart disease Maternal Grandfather   . Brain cancer Maternal Grandfather   . Heart disease Paternal Grandmother   . Heart disease Paternal Grandfather   . Rectal cancer Other        MGF's mother  . Colon cancer Neg Hx   . Esophageal cancer Neg Hx   . Breast cancer Neg Hx     The patient has one daughter who is cancer free.  She has a maternal half sister and a paternal half sister and two paternal half brothers who are call cancer free.  Both parents are living.  The patient's mother is currently being worked up for peritoneal cancer.  She has one sister and two brothers who are cancer free.  Her father died of brain cancer and her mother died of heart disease.  Her father's mother was diagnosed with rectal cancer at age 72.  The patient's father is cancer free. He has three brothers, one died at 40.  The paternal grandparents are deceased.  Gwendolyn [name] is unaware of previous family history of genetic testing for hereditary cancer risks. Patient's maternal ancestors are of Caucasian descent, and paternal ancestors are of [place] descent. There is no reported Ashkenazi Jewish ancestry. There is no known consanguinity.    GENETIC COUNSELING ASSESSMENT: Gwendolyn [name] is a 48 y.o. female with a personal and family history of cancer which is somewhat suggestive of a hereditary cancer syndrome and predisposition to cancer given her young age of onset for both breast cancers. We, therefore, discussed  and recommended the following at today's visit.   DISCUSSION: We discussed that 5 - 10% of breast cancer is hereditary, with most cases associated with BRCA mutations.  There are other genes that can be associated with hereditary breast cancer syndromes.  These include ATM, CHEK2 and PALB2. Based on the patient's previous genetic testing that was normal, the most likely scenario is that her BRCA testing will be normal again.  However, there are additional genes that need to be evaluated.  We discussed that testing is beneficial for several reasons including knowing how to follow individuals  after completing their treatment, identifying whether potential treatment options such as PARP inhibitors would be beneficial, and understand if other family members could be at risk for cancer and allow them to undergo genetic testing.   We reviewed the characteristics, features and inheritance patterns of hereditary cancer syndromes. We also discussed genetic testing, including the appropriate family members to test, the process of testing, insurance coverage and turn-around-time for results. We discussed the implications of a negative, positive, carrier and/or variant of uncertain significant result. We recommended Gwendolyn [name] pursue genetic testing for the common hereditary cancer gene panel. The Common Hereditary Gene Panel offered by [facility] includes sequencing and/or deletion duplication testing of the following 48 genes: APC, ATM, AXIN2, BARD1, BMPR1A, BRCA1, BRCA2, BRIP1, CDH1, CDK4, CDKN2A (p14ARF), CDKN2A (p16INK4a), CHEK2, CTNNA1, DICER1, EPCAM (Deletion/duplication testing only), GREM1 (promoter region deletion/duplication testing only), KIT, MEN1, MLH1, MSH2, MSH3, MSH6, MUTYH, NBN, NF1, NHTL1, PALB2, PDGFRA, PMS2, POLD1, POLE, PTEN, RAD50, RAD51C, RAD51D, RNF43, SDHB, SDHC, SDHD, SMAD4, SMARCA4. STK11, TP53, TSC1, TSC2, and VHL.  The following genes were evaluated for sequence changes only: SDHA and  HOXB13 c.251G>A variant only.   Based on Gwendolyn [name]'s personal and family history of cancer, she meets medical criteria for genetic testing. Despite that she meets criteria, she may still have an out of pocket cost. We discussed that if her out of pocket cost for testing is over $100, the laboratory will call and confirm whether she wants to proceed with testing.  If the out of pocket cost of testing is less than $100 she will be billed by the genetic testing laboratory.   PLAN: After considering the risks, benefits, and limitations, Gwendolyn [name] provided informed consent to pursue genetic testing and the blood sample was sent to [facility] for analysis of the common hereditary cancer panel. Results should be available within approximately 2-3 weeks' time, at which point they will be disclosed by telephone to Gwendolyn [name], as will any additional recommendations warranted by these results. Gwendolyn [name] will receive a summary of her genetic counseling visit and a copy of her results once available. This information will also be available in Epic.   Lastly, we encouraged Gwendolyn [name] to remain in contact with cancer genetics annually so that we can continuously update the family history and inform her of any changes in cancer genetics and testing that may be of benefit for this family.   Gwendolyn [name]'s questions were answered to her satisfaction today. Our contact information was provided should additional questions or concerns arise. Thank you for the referral and allowing [place] to share in the care of your patient.   Flo Berroa P. Florene [place], [place], [facility] Licensed, Insurance risk surveyor [email or web address] phone: [phone]  The patient was seen for a total of 40 minutes in face-to-face genetic counseling.  This patient was discussed with Drs. Magrinat, Lindi Adie and/or Burr Medico who agrees with the above.     _______________________________________________________________________ For Office Staff:  Number of people involved in session: 1 Was an Intern/ student involved with case: no

## 2019-01-02 ENCOUNTER — Other Ambulatory Visit: Payer: Self-pay | Admitting: Internal Medicine

## 2019-01-02 DIAGNOSIS — M797 Fibromyalgia: Secondary | ICD-10-CM

## 2019-01-11 ENCOUNTER — Encounter: Payer: Self-pay | Admitting: Internal Medicine

## 2019-01-11 DIAGNOSIS — N3090 Cystitis, unspecified without hematuria: Secondary | ICD-10-CM

## 2019-01-11 DIAGNOSIS — R300 Dysuria: Secondary | ICD-10-CM

## 2019-01-19 ENCOUNTER — Ambulatory Visit: Payer: BC Managed Care – PPO | Admitting: Family Medicine

## 2019-01-19 ENCOUNTER — Encounter: Payer: Self-pay | Admitting: Family Medicine

## 2019-01-19 ENCOUNTER — Ambulatory Visit: Payer: Self-pay | Admitting: Genetic Counselor

## 2019-01-19 ENCOUNTER — Encounter: Payer: Self-pay | Admitting: Genetic Counselor

## 2019-01-19 ENCOUNTER — Other Ambulatory Visit: Payer: Self-pay

## 2019-01-19 ENCOUNTER — Telehealth: Payer: Self-pay | Admitting: Genetic Counselor

## 2019-01-19 VITALS — BP 114/60 | HR 98 | Temp 98.8°F | Wt 160.8 lb

## 2019-01-19 DIAGNOSIS — N390 Urinary tract infection, site not specified: Secondary | ICD-10-CM

## 2019-01-19 DIAGNOSIS — B3731 Acute candidiasis of vulva and vagina: Secondary | ICD-10-CM

## 2019-01-19 DIAGNOSIS — Z1379 Encounter for other screening for genetic and chromosomal anomalies: Secondary | ICD-10-CM | POA: Insufficient documentation

## 2019-01-19 DIAGNOSIS — B373 Candidiasis of vulva and vagina: Secondary | ICD-10-CM

## 2019-01-19 DIAGNOSIS — R300 Dysuria: Secondary | ICD-10-CM

## 2019-01-19 LAB — POCT URINALYSIS DIPSTICK
Glucose, UA: NEGATIVE
Ketones, UA: POSITIVE
Nitrite, UA: POSITIVE
Protein, UA: POSITIVE — AB
Spec Grav, UA: 1.025 (ref 1.010–1.025)
Urobilinogen, UA: 2 E.U./dL — AB
pH, UA: 5 (ref 5.0–8.0)

## 2019-01-19 LAB — URINALYSIS, MICROSCOPIC ONLY

## 2019-01-19 MED ORDER — CIPROFLOXACIN HCL 500 MG PO TABS: 500.0000 mg | ORAL_TABLET | Freq: Two times a day (BID) | ORAL | 0 refills | Status: DC

## 2019-01-19 MED ORDER — FLUCONAZOLE 150 MG PO TABS: 150.0000 mg | ORAL_TABLET | Freq: Every day | ORAL | 0 refills | Status: AC

## 2019-01-19 NOTE — Telephone Encounter (Signed)
Revealed negative genetic testing.  Discussed that we do not know why she has breast cancer or why there is cancer in the family. It could be due to a different gene that we are not testing, or maybe our current technology may not be able to pick something up.  It will be important for her to keep in contact with genetics to keep up with whether additional testing may be needed. 

## 2019-01-19 NOTE — Telephone Encounter (Signed)
LM on VM that results are back and to please call. 

## 2019-01-19 NOTE — Progress Notes (Signed)
HPI:  Gwendolyn [name] was previously seen in the [facility] due to a personal and family history of cancer and concerns regarding a hereditary predisposition to cancer. Please refer to our prior cancer genetics clinic note for more information regarding our discussion, assessment and recommendations, at the time. Gwendolyn [name]'s recent genetic test results were disclosed to her, as were recommendations warranted by these results. These results and recommendations are discussed in more detail below.  CANCER HISTORY:  Oncology History Overview Note  Cancer Staging No matching staging information was found for the patient.    Ductal carcinoma in situ (DCIS) of right breast  11/06/2018 Mammogram   Mammogram 11/06/18  IMPRESSION: No mammographic evidence of malignancy. A result letter of this screening mammogram will be mailed directly to the patient.   11/13/2018 Surgery   B/l Breast Reduction   11/13/2018 Pathology Results    Diagnosis 11/13/18 1. Breast, Mammoplasty, right tissue, 76g - DUCTAL CARCINOMA IN SITU. 0.4cm 2. Breast, Mammoplasty, left tissue, 212g - BENIGN BREAST TISSUE.  Estrogen Receptor: 95%, POSITIVE, STRONG STAINING INTENSITY Progesterone Receptor: 95%, POSITIVE, STRONG STAINING INTENSITY   12/06/2018 Initial Diagnosis   Ductal carcinoma in situ (DCIS) of right breast   01/16/2019 Genetic Testing   Negative genetic testing on the common hereditary cancer panel.  The Common Hereditary Gene Panel offered by [facility] includes sequencing and/or deletion duplication testing of the following 48 genes: APC, ATM, AXIN2, BARD1, BMPR1A, BRCA1, BRCA2, BRIP1, CDH1, CDK4, CDKN2A (p14ARF), CDKN2A (p16INK4a), CHEK2, CTNNA1, DICER1, EPCAM (Deletion/duplication testing only), GREM1 (promoter region deletion/duplication testing only), KIT, MEN1, MLH1, MSH2, MSH3, MSH6, MUTYH, NBN, NF1, NHTL1, PALB2, PDGFRA, PMS2, POLD1, POLE, PTEN, RAD50, RAD51C, RAD51D, RNF43,  SDHB, SDHC, SDHD, SMAD4, SMARCA4. STK11, TP53, TSC1, TSC2, and VHL.  The following genes were evaluated for sequence changes only: SDHA and HOXB13 c.251G>A variant only. The report date is January 16, 2019.     FAMILY HISTORY:  We obtained a detailed, 4-generation family history.  Significant diagnoses are listed below: Family History  Problem Relation Age of Onset  . Meniere's disease Father   . Alcohol abuse Sister   . Heart disease Maternal Grandmother   . Heart disease Maternal Grandfather   . Brain cancer Maternal Grandfather   . Heart disease Paternal Grandmother   . Heart disease Paternal Grandfather   . Rectal cancer Other        MGF's mother  . Colon cancer Neg Hx   . Esophageal cancer Neg Hx   . Breast cancer Neg Hx     The patient has one daughter who is cancer free.  She has a maternal half sister and a paternal half sister and two paternal half brothers who are call cancer free.  Both parents are living.  The patient's mother is currently being worked up for peritoneal cancer.  She has one sister and two brothers who are cancer free.  Her father died of brain cancer and her mother died of heart disease.  Her father's mother was diagnosed with rectal cancer at age 78.  The patient's father is cancer free. He has three brothers, one died at 50.  The paternal grandparents are deceased.  Gwendolyn [name] is unaware of previous family history of genetic testing for hereditary cancer risks. Patient's maternal ancestors are of Caucasian descent, and paternal ancestors are of [place] descent. There is no reported Ashkenazi Jewish ancestry. There is no known consanguinity.     GENETIC TEST RESULTS: Genetic testing reported out  on January 16, 2019 through the Common Hereditary cancer panel found no pathogenic mutations. The Common Hereditary Gene Panel offered by [facility] includes sequencing and/or deletion duplication testing of the following 48 genes: APC, ATM, AXIN2,  BARD1, BMPR1A, BRCA1, BRCA2, BRIP1, CDH1, CDK4, CDKN2A (p14ARF), CDKN2A (p16INK4a), CHEK2, CTNNA1, DICER1, EPCAM (Deletion/duplication testing only), GREM1 (promoter region deletion/duplication testing only), KIT, MEN1, MLH1, MSH2, MSH3, MSH6, MUTYH, NBN, NF1, NHTL1, PALB2, PDGFRA, PMS2, POLD1, POLE, PTEN, RAD50, RAD51C, RAD51D, RNF43, SDHB, SDHC, SDHD, SMAD4, SMARCA4. STK11, TP53, TSC1, TSC2, and VHL.  The following genes were evaluated for sequence changes only: SDHA and HOXB13 c.251G>A variant only. The test report has been scanned into EPIC and is located under the Molecular Pathology section of the Results Review tab.  A portion of the result report is included below for reference.     We discussed with Gwendolyn [name] that because current genetic testing is not perfect, it is possible there may be a gene mutation in one of these genes that current testing cannot detect, but that chance is small.  We also discussed, that there could be another gene that has not yet been discovered, or that we have not yet tested, that is responsible for the cancer diagnoses in the family. It is also possible there is a hereditary cause for the cancer in the family that Gwendolyn [name] did not inherit and therefore was not identified in her testing.  Therefore, it is important to remain in touch with cancer genetics in the future so that we can continue to offer Gwendolyn [name] the most up to date genetic testing.   ADDITIONAL GENETIC TESTING: We discussed with Gwendolyn [name] that there are other genes that are associated with increased cancer risk that can be analyzed. Should Gwendolyn [name] wish to pursue additional genetic testing, we are happy to discuss and coordinate this testing, at any time.    CANCER SCREENING RECOMMENDATIONS: Gwendolyn [name]'s test result is considered negative (normal).  This means that we have not identified a hereditary cause for her personal and family history of cancer at this  time. Most cancers happen by chance and this negative test suggests that her cancer may fall into this category.    While reassuring, this does not definitively rule out a hereditary predisposition to cancer. It is still possible that there could be genetic mutations that are undetectable by current technology. There could be genetic mutations in genes that have not been tested or identified to increase cancer risk.  Therefore, it is recommended she continue to follow the cancer management and screening guidelines provided by her oncology and primary healthcare provider.   An individual's cancer risk and medical management are not determined by genetic test results alone. Overall cancer risk assessment incorporates additional factors, including personal medical history, family history, and any available genetic information that may result in a personalized plan for cancer prevention and surveillance  RECOMMENDATIONS FOR FAMILY MEMBERS:  Individuals in this family might be at some increased risk of developing cancer, over the general population risk, simply due to the family history of cancer.  We recommended women in this family have a yearly mammogram beginning at age 59, or 47 years younger than the earliest onset of cancer, an annual clinical breast exam, and perform monthly breast self-exams. Women in this family should also have a gynecological exam as recommended by their primary provider. All family members should have a colonoscopy by age 48.  FOLLOW-UP: Lastly, we discussed with Gwendolyn [name] that cancer  genetics is a rapidly advancing field and it is possible that new genetic tests will be appropriate for her and/or her family members in the future. We encouraged her to remain in contact with cancer genetics on an annual basis so we can update her personal and family histories and let her know of advances in cancer genetics that may benefit this family.   Our contact number was provided. Ms.  [name]'s questions were answered to her satisfaction, and she knows she is welcome to call us at anytime with additional questions or concerns.   Roma Kayser, [place], [facility] Licensed, Certified Genetic Counselor [email or web address]

## 2019-01-19 NOTE — Progress Notes (Signed)
Subjective:    Patient ID: Gwendolyn [name], female    DOB: [birth year], 48 y.o.   MRN: [id number]  HPI  Patient presents to clinic due to burning, pressure and increased urgency with urination.  Patient has issues with UTI type symptoms frequently.  She does have urology appointment set up in mid October.  States her symptoms for very rough yesterday, and knew she needed to come in and get treated and could not wait until her appointment.  States she did take Azo with some relief.  No fever or chills.  No visible blood in urine.  No mucus in urine.  No vaginal bleeding or vaginal discharge.  Denies concerns for STD.  Patient Active Problem List   Diagnosis Date Noted  . Genetic testing 01/19/2019  . Family history of brain cancer   . Family history of colon cancer   . UTI (urinary tract infection) 12/08/2018  . 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  . 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 Hematuria 12/05/2017  . Rectal bleeding 09/02/2017  . Encounter for preventive health examination 11/22/2016  . History of abdominal supracervical subtotal hysterectomy 11/22/2016  . Achilles tendinitis of both lower extremities 11/10/2015  . Plantar fasciitis 11/10/2015  . Obesity (BMI 30.0-34.9) 07/23/2015  . Vaginitis 03/18/2015  . Menopause 01/18/2015  . Hypercholesteremia 10/05/2014  . Vitamin D deficiency 10/05/2014  . Depressive disorder 10/05/2014  . History of breast cancer 10/05/2014  . Migraine without aura with status migrainosus 10/05/2014  . 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 Insomnia 10/05/2014  . Bipolar 2 disorder ([place]) 10/05/2014  . IBS (irritable bowel syndrome) 10/05/2014  . Obstructive sleep apnea 10/05/2014  . S/P colectomy 10/05/2014  . Hypersomnia 10/05/2014  . Anxiety 10/05/2014  . Fibromyalgia 10/05/2014  . Periodic limb movement disorder 10/05/2014   Social History   Tobacco Use  . Smoking status:  Former Smoker    Packs/day: 1.00    Years: 20.00    Pack years: 20.00    Quit date: 04/23/2002    Years since quitting: 16.7  . Smokeless tobacco: Never Used  . Tobacco comment: QUIT IN 2004  Substance Use Topics  . Alcohol use: Yes    Alcohol/week: 0.0 standard drinks    Comment: OCCASIONALLY    Review of Systems  Constitutional: Negative for chills, fatigue and fever.  HENT: Negative for congestion, ear pain, sinus pain and sore throat.   Eyes: Negative.   Respiratory: Negative for cough, shortness of breath and wheezing.   Cardiovascular: Negative for chest pain, palpitations and leg swelling.  Gastrointestinal: Negative for abdominal pain, diarrhea, nausea and vomiting.  Genitourinary: +dysuria, frequency and urgency.  Musculoskeletal: Negative for arthralgias and myalgias.  Skin: Negative for color change, pallor and rash.  Neurological: Negative for syncope, light-headedness and headaches.  Psychiatric/Behavioral: The patient is not nervous/anxious.       Objective:   Physical Exam Vitals signs and nursing note reviewed.  Constitutional:      General: She is not in acute distress.    Appearance: She is not ill-appearing or toxic-appearing.  HENT:     Head: Normocephalic and atraumatic.  Eyes:     General: No scleral icterus.    Extraocular Movements: Extraocular movements intact.     Pupils: Pupils are equal, round, and reactive to light.  Neck:     Musculoskeletal: Normal range of motion and neck supple.  Cardiovascular:     Rate and Rhythm: Normal rate  and regular rhythm.  Pulmonary:     Effort: Pulmonary effort is normal. No respiratory distress.     Breath sounds: Normal breath sounds.  Abdominal:     General: Bowel sounds are normal.     Palpations: Abdomen is soft.     Tenderness: There is abdominal tenderness (mild suprapubic tenderness).  Skin:    General: Skin is warm and dry.  Neurological:     Mental Status: She is alert and oriented to person,  place, and time.     Gait: Gait normal.  Psychiatric:        Mood and Affect: Mood normal.        Behavior: Behavior normal.    Today's Vitals   01/19/19 1029  BP: 114/60  Pulse: 98  Temp: 98.8 F (37.1 C)  SpO2: 99%  Weight: 160 lb 12.8 oz (72.9 kg)   Body mass index is 25.18 kg/m.     Assessment & Plan:   We will treat patient with Cipro twice daily for 5 days.  Urinalysis dipstick does show positive leukocytes and nitrites, but I do believe this result is skewed by her taking Azo.  Urine culture sent to lab.  Advised to keep urology appointment as planned.  We will also give her Diflucan due to development of vaginal yeast infections almost always been taken antibiotic.  Recommended lots of water, avoiding excess sugar and caffeine, wearing cotton underwear.  1. Dysuria  - ciprofloxacin (CIPRO) 500 MG tablet; Take 1 tablet (500 mg total) by mouth 2 (two) times daily.  Dispense: 10 tablet; Refill: 0 - Urine Culture - Urine Microscopic Only  2. Urinary tract infection without hematuria, site unspecified  - ciprofloxacin (CIPRO) 500 MG tablet; Take 1 tablet (500 mg total) by mouth 2 (two) times daily.  Dispense: 10 tablet; Refill: 0  3. Vaginal yeast infection  - fluconazole (DIFLUCAN) 150 MG tablet; Take 1 tablet (150 mg total) by mouth daily for 3 doses.  Dispense: 3 tablet; Refill: 0   She will keep all regular follow-ups as planned.  She will return to clinic sooner if any issues arise.

## 2019-01-21 LAB — URINE CULTURE
MICRO NUMBER:: 928642
SPECIMEN QUALITY:: ADEQUATE

## 2019-02-09 ENCOUNTER — Encounter: Payer: Self-pay | Admitting: Urology

## 2019-02-09 ENCOUNTER — Ambulatory Visit: Payer: BC Managed Care – PPO | Admitting: Urology

## 2019-02-09 ENCOUNTER — Other Ambulatory Visit: Payer: Self-pay

## 2019-02-09 VITALS — BP 109/71 | HR 78 | Ht 67.0 in | Wt 155.0 lb

## 2019-02-09 DIAGNOSIS — R300 Dysuria: Secondary | ICD-10-CM | POA: Diagnosis not present

## 2019-02-09 DIAGNOSIS — N390 Urinary tract infection, site not specified: Secondary | ICD-10-CM

## 2019-02-09 LAB — BLADDER SCAN AMB NON-IMAGING: Scan Result: 15

## 2019-02-09 MED ORDER — TRIMETHOPRIM 100 MG PO TABS: 100.0000 mg | ORAL_TABLET | Freq: Every day | ORAL | 2 refills | Status: DC

## 2019-02-09 MED ORDER — CIPROFLOXACIN HCL 500 MG PO TABS: 500.0000 mg | ORAL_TABLET | Freq: Two times a day (BID) | ORAL | 0 refills | Status: DC

## 2019-02-09 NOTE — Progress Notes (Signed)
   02/09/2019 10:38 AM   Gwendolyn [name] 11-23-1970 [id number]  Reason for visit: Recurrent UTIs  HPI: I saw Gwendolyn [name] in urology clinic today for recurrent UTIs.  I previously saw her in October 2019 for a microscopic hematuria work-up which was completely negative.  She reports 5 UTIs over the last year, which coincides with a new sexual partner.  She does think that these UTIs are related to sexual activity.  She has multiple positive urine cultures in the chart which I reviewed.  Her symptoms with UTI are dysuria, pain, urgency, and frequency.  She denies any UTI symptoms today.  Urinalysis today is benign appearing.  She has breast cancer currently and is undergoing treatment.  She denies any flank pain, fevers, or chills.   ROS: Please see flowsheet from today's date for complete review of systems.  Physical Exam: BP 109/71   Pulse 78   Ht 5\' 7"  (1.702 m)   Wt 155 lb (70.3 kg)   BMI 24.28 kg/m    Constitutional:  Alert and oriented, No acute distress. Respiratory: Normal respiratory effort, no increased work of breathing. GI: Abdomen is soft, nontender, nondistended, no abdominal masses Skin: No rashes, bruises or suspicious lesions. Neurologic: Grossly intact, no focal deficits, moving all 4 extremities. Psychiatric: Normal mood and affect  Assessment & Plan:   In summary, she is a 48 year old female with recurrent UTIs that are associated with sexual activity.  We discussed the evaluation and treatment of patients with recurrent UTIs at length.  We specifically discussed the differences between asymptomatic bacteriuria and true urinary tract infection.  We discussed the AUA definition of recurrent UTI of at least 2 culture proven symptomatic acute cystitis episodes in a 79-month period, or 3 within a 1 year period.  We discussed the importance of culture directed antibiotic treatment, and antibiotic stewardship.  First-line therapy includes nitrofurantoin(5  days), Bactrim(3 days), or fosfomycin(3 g single dose).  Possible etiologies of recurrent infection include periurethral tissue atrophy in postmenopausal woman, constipation, sexual activity, incomplete emptying, anatomic abnormalities, and even genetic predisposition.  Finally, we discussed the role of perineal hygiene, timed voiding, adequate hydration, topical vaginal estrogen, cranberry prophylaxis, and low-dose antibiotic prophylaxis.  With her breast cancer, she is not a candidate for topical vaginal estrogen.  -Cranberry tablets twice daily for prophylaxis -Trimethoprim 100 mg daily prophylaxis x3 months -RTC 3 months for symptom check, discontinue antibiotics at that time if doing well  A total of 15 minutes were spent face-to-face with the patient, greater than 50% was spent in patient education, counseling, and coordination of care regarding UTI prevention strategies.  Gwendolyn [name], [facility] [address] [place], [place] [address] [phone]

## 2019-02-09 NOTE — Patient Instructions (Addendum)
1. Start cranberry tablets twice daily 2. Start Trimethoprim daily for UTI prophylaxis  Urinary Tract Infection, Adult  A urinary tract infection (UTI) is an infection of any part of the urinary tract. The urinary tract includes the kidneys, ureters, bladder, and urethra. These organs make, store, and get rid of urine in the body. Your health care provider may use other names to describe the infection. An upper UTI affects the ureters and kidneys (pyelonephritis). A lower UTI affects the bladder (cystitis) and urethra (urethritis). What are the causes? Most urinary tract infections are caused by bacteria in your genital area, around the entrance to your urinary tract (urethra). These bacteria grow and cause inflammation of your urinary tract. What increases the risk? You are more likely to develop this condition if:  You have a urinary catheter that stays in place (indwelling).  You are not able to control when you urinate or have a bowel movement (you have incontinence).  You are female and you: ? Use a spermicide or diaphragm for birth control. ? Have low estrogen levels. ? Are pregnant.  You have certain genes that increase your risk (genetics).  You are sexually active.  You take antibiotic medicines.  You have a condition that causes your flow of urine to slow down, such as: ? An enlarged prostate, if you are female. ? Blockage in your urethra (stricture). ? A kidney stone. ? A nerve condition that affects your bladder control (neurogenic bladder). ? Not getting enough to drink, or not urinating often.  You have certain medical conditions, such as: ? Diabetes. ? A weak disease-fighting system (immunesystem). ? Sickle cell disease. ? Gout. ? Spinal cord injury. What are the signs or symptoms? Symptoms of this condition include:  Needing to urinate right away (urgently).  Frequent urination or passing small amounts of urine frequently.  Pain or burning with urination.   Blood in the urine.  Urine that smells bad or unusual.  Trouble urinating.  Cloudy urine.  Vaginal discharge, if you are female.  Pain in the abdomen or the lower back. You may also have:  Vomiting or a decreased appetite.  Confusion.  Irritability or tiredness.  A fever.  Diarrhea. The first symptom in older adults may be confusion. In some cases, they may not have any symptoms until the infection has worsened. How is this diagnosed? This condition is diagnosed based on your medical history and a physical exam. You may also have other tests, including:  Urine tests.  Blood tests.  Tests for sexually transmitted infections (STIs). If you have had more than one UTI, a cystoscopy or imaging studies may be done to determine the cause of the infections. How is this treated? Treatment for this condition includes:  Antibiotic medicine.  Over-the-counter medicines to treat discomfort.  Drinking enough water to stay hydrated. If you have frequent infections or have other conditions such as a kidney stone, you may need to see a health care provider who specializes in the urinary tract (urologist). In rare cases, urinary tract infections can cause sepsis. Sepsis is a life-threatening condition that occurs when the body responds to an infection. Sepsis is treated in the hospital with IV antibiotics, fluids, and other medicines. Follow these instructions at home:  Medicines  Take over-the-counter and prescription medicines only as told by your health care provider.  If you were prescribed an antibiotic medicine, take it as told by your health care provider. Do not stop using the antibiotic even if you start to  feel better. General instructions  Make sure you: ? Empty your bladder often and completely. Do not hold urine for long periods of time. ? Empty your bladder after sex. ? Wipe from front to back after a bowel movement if you are female. Use each tissue one time when  you wipe.  Drink enough fluid to keep your urine pale yellow.  Keep all follow-up visits as told by your health care provider. This is important. Contact a health care provider if:  Your symptoms do not get better after 1-2 days.  Your symptoms go away and then return. Get help right away if you have:  Severe pain in your back or your lower abdomen.  A fever.  Nausea or vomiting. Summary  A urinary tract infection (UTI) is an infection of any part of the urinary tract, which includes the kidneys, ureters, bladder, and urethra.  Most urinary tract infections are caused by bacteria in your genital area, around the entrance to your urinary tract (urethra).  Treatment for this condition often includes antibiotic medicines.  If you were prescribed an antibiotic medicine, take it as told by your health care provider. Do not stop using the antibiotic even if you start to feel better.  Keep all follow-up visits as told by your health care provider. This is important. This information is not intended to replace advice given to you by your health care provider. Make sure you discuss any questions you have with your health care provider. Document Released: 01/17/2005 Document Revised: 03/27/2018 Document Reviewed: 10/17/2017 Elsevier Patient Education  2020 [employer].

## 2019-02-10 ENCOUNTER — Other Ambulatory Visit: Payer: Self-pay | Admitting: Internal Medicine

## 2019-02-10 LAB — URINALYSIS, COMPLETE
Bilirubin, UA: NEGATIVE
Glucose, UA: NEGATIVE
Ketones, UA: NEGATIVE
Leukocytes,UA: NEGATIVE
Nitrite, UA: NEGATIVE
Protein,UA: NEGATIVE
Specific Gravity, UA: 1.025 (ref 1.005–1.030)
Urobilinogen, Ur: 0.2 mg/dL (ref 0.2–1.0)
pH, UA: 5.5 (ref 5.0–7.5)

## 2019-02-10 LAB — MICROSCOPIC EXAMINATION

## 2019-02-16 ENCOUNTER — Encounter: Payer: Self-pay | Admitting: Hematology

## 2019-02-17 ENCOUNTER — Other Ambulatory Visit: Payer: Self-pay | Admitting: Nurse Practitioner

## 2019-02-17 DIAGNOSIS — D0511 Intraductal carcinoma in situ of right breast: Secondary | ICD-10-CM

## 2019-02-24 ENCOUNTER — Other Ambulatory Visit: Payer: Self-pay | Admitting: Nurse Practitioner

## 2019-02-24 ENCOUNTER — Other Ambulatory Visit: Payer: Self-pay

## 2019-02-24 ENCOUNTER — Ambulatory Visit (HOSPITAL_COMMUNITY)
Admission: RE | Admit: 2019-02-24 | Discharge: 2019-02-24 | Disposition: A | Discharge status: Home / Self Care | Payer: BC Managed Care – PPO | Source: Ambulatory Visit | Attending: Nurse Practitioner | Admitting: Nurse Practitioner

## 2019-02-24 DIAGNOSIS — N6320 Unspecified lump in the left breast, unspecified quadrant: Secondary | ICD-10-CM

## 2019-02-24 DIAGNOSIS — D0511 Intraductal carcinoma in situ of right breast: Secondary | ICD-10-CM | POA: Diagnosis present

## 2019-02-24 MED ORDER — GADOBUTROL 1 MMOL/ML IV SOLN
7.0000 mL | Freq: Once | INTRAVENOUS | Status: AC | PRN
  Administered 2019-02-24: 09:00:00 7 mL via INTRAVENOUS

## 2019-02-25 ENCOUNTER — Other Ambulatory Visit: Payer: Self-pay | Admitting: Nurse Practitioner

## 2019-02-25 DIAGNOSIS — N6320 Unspecified lump in the left breast, unspecified quadrant: Secondary | ICD-10-CM

## 2019-02-25 DIAGNOSIS — D0511 Intraductal carcinoma in situ of right breast: Secondary | ICD-10-CM

## 2019-02-27 ENCOUNTER — Telehealth: Payer: Self-pay

## 2019-02-27 NOTE — Telephone Encounter (Signed)
Left voice message for patient to f/u on whether she has gotten scheduled for breast US and mammogram.  I asked her to call back and leave me a message of when her appointments are or if she needs assistance in getting scheduled.

## 2019-03-02 ENCOUNTER — Ambulatory Visit
Admission: RE | Admit: 2019-03-02 | Discharge: 2019-03-02 | Disposition: A | Discharge status: Home / Self Care | Payer: BC Managed Care – PPO | Source: Ambulatory Visit | Attending: Nurse Practitioner | Admitting: Nurse Practitioner

## 2019-03-02 DIAGNOSIS — N6320 Unspecified lump in the left breast, unspecified quadrant: Secondary | ICD-10-CM

## 2019-03-02 DIAGNOSIS — D0511 Intraductal carcinoma in situ of right breast: Secondary | ICD-10-CM

## 2019-03-04 ENCOUNTER — Other Ambulatory Visit: Payer: Self-pay | Admitting: Surgery

## 2019-03-04 DIAGNOSIS — R9389 Abnormal findings on diagnostic imaging of other specified body structures: Secondary | ICD-10-CM

## 2019-03-06 ENCOUNTER — Telehealth: Payer: Self-pay | Admitting: *Deleted

## 2019-03-06 NOTE — Telephone Encounter (Signed)
Copied from [id number]. Topic: Appointment Scheduling - Scheduling Inquiry for Clinic >> Mar 06, 2019  9:40 AM Percell Belt A wrote: Reason for CRM: pt called in and stated that she has a form from her ins that has to filled out about her smoking before 11/30.  She would like to know if she can be worked in before then [employer] number [phone]

## 2019-03-10 NOTE — Telephone Encounter (Signed)
Patient calling and states that she had not heard from anyone regarding this appointment. States that she would like a call back to discuss an appointment. Please advise.  Also states that she is not available this Thursday 03/12/2019, due to getting a biopsy done.

## 2019-03-10 NOTE — Telephone Encounter (Signed)
Appointment made for 03/13/2019 as virtual at [facility].

## 2019-03-12 ENCOUNTER — Other Ambulatory Visit: Payer: Self-pay

## 2019-03-12 ENCOUNTER — Other Ambulatory Visit: Payer: Self-pay | Admitting: Surgery

## 2019-03-12 ENCOUNTER — Ambulatory Visit: Payer: BC Managed Care – PPO

## 2019-03-12 ENCOUNTER — Ambulatory Visit
Admission: RE | Admit: 2019-03-12 | Discharge: 2019-03-12 | Disposition: A | Discharge status: Home / Self Care | Payer: BC Managed Care – PPO | Source: Ambulatory Visit | Attending: Surgery | Admitting: Surgery

## 2019-03-12 DIAGNOSIS — R9389 Abnormal findings on diagnostic imaging of other specified body structures: Secondary | ICD-10-CM

## 2019-03-12 MED ORDER — GADOBUTROL 1 MMOL/ML IV SOLN
7.0000 mL | Freq: Once | INTRAVENOUS | Status: AC | PRN
  Administered 2019-03-12: 7 mL via INTRAVENOUS

## 2019-03-13 ENCOUNTER — Other Ambulatory Visit: Payer: Self-pay

## 2019-03-13 ENCOUNTER — Encounter: Payer: Self-pay | Admitting: Internal Medicine

## 2019-03-13 ENCOUNTER — Ambulatory Visit (INDEPENDENT_AMBULATORY_CARE_PROVIDER_SITE_OTHER): Payer: BC Managed Care – PPO | Admitting: Internal Medicine

## 2019-03-13 DIAGNOSIS — Z720 Tobacco use: Secondary | ICD-10-CM | POA: Diagnosis not present

## 2019-03-13 DIAGNOSIS — Z87891 Personal history of nicotine dependence: Secondary | ICD-10-CM | POA: Insufficient documentation

## 2019-03-13 DIAGNOSIS — Z716 Tobacco abuse counseling: Secondary | ICD-10-CM

## 2019-03-13 MED ORDER — VARENICLINE TARTRATE 1 MG PO TABS: 1.0000 mg | ORAL_TABLET | Freq: Two times a day (BID) | ORAL | 2 refills | Status: DC

## 2019-03-13 MED ORDER — CHANTIX STARTING MONTH PAK 0.5 MG X 11 & 1 MG X 42 PO TABS: ORAL_TABLET | ORAL | 0 refills | Status: DC

## 2019-03-13 NOTE — Progress Notes (Signed)
Virtual Visit via [email or web address]  This visit type was conducted due to national recommendations for restrictions regarding the COVID-19 pandemic (e.g. social distancing).  This format is felt to be most appropriate for this patient at this time.  All issues noted in this document were discussed and addressed.  No physical exam was performed (except for noted visual exam findings with Video Visits).   I connected with@ on  at  9:00 AM EST by a video enabled telemedicine application and verified that I am speaking with the correct person using two identifiers. Location patient: home Location provider: work or home office Persons participating in the virtual visit: patient, provider  I discussed the limitations, risks, security and privacy concerns of performing an evaluation and management service by telephone and the availability of in person appointments. I also discussed with the patient that there may be a patient responsible charge related to this service. The patient expressed understanding and agreed to proceed.   Reason for visit: help with tobacco cessation  HPI:  48 yr old female with history of  ADD,  migraine , IBS and hypercholesterolemia presents with desire to quit smoking   Patient started smoking during the [place] as a result of working from home and sharing a space with am colleague who smokes.  She is requesting chantix, because she has already tried using nicotine patches and has developed immediate nausea and skin irritation .  She is smoking up to 15 cigs daily.   ROS: See pertinent positives and negatives per HPI.  Past Medical History:  Diagnosis Date  . Anal fissure   . Anxiety   . Breast cancer ([place]) 2002   lumpectomy-Right  . Depression   . Family history of brain cancer   . Family history of colon cancer   . Fibromyalgia   . Gallstones   . Irritable bowel syndrome   . Personal history of radiation therapy   . Yeast vaginitis     Past Surgical History:   Procedure Laterality Date  . ABDOMINAL HYSTERECTOMY    . 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 BREAST BIOPSY    . BREAST LUMPECTOMY Right 2003   Radiatiomn Therapy DCIS  . CHOLECYSTECTOMY  2000  . COLECTOMY  10/31/2012   SECONDARY TO COLONIC INERTIA  . 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 SUPRACERVICAL ABDOMINAL HYSTERECTOMY  04/10/2011   But still has ovaries.    Family History  Problem Relation Age of Onset  . Meniere's disease Father   . Alcohol abuse Sister   . Heart disease Maternal Grandmother   . Heart disease Maternal Grandfather   . Brain cancer Maternal Grandfather   . Heart disease Paternal Grandmother   . Heart disease Paternal Grandfather   . Rectal cancer Other        MGF's mother  . Colon cancer Neg Hx   . Esophageal cancer Neg Hx   . Breast cancer Neg Hx     SOCIAL HX:  reports that she has been smoking. She has a 20.00 pack-year smoking history. She has never used smokeless tobacco. She reports current alcohol use. She reports that she does not use drugs.   Current Outpatient Medications:  .  amphetamine-dextroamphetamine (ADDERALL) 30 MG tablet, Take 30 mg by mouth 2 (two) times daily. , Disp: , Rfl: 0 .  DULoxetine (CYMBALTA) 60 MG capsule, Take 60 mg by mouth daily., Disp: , Rfl:  .  gabapentin (NEURONTIN) 800 MG tablet, TAKE 1 TABLET (800 MG TOTAL) BY MOUTH NIGHTLY., Disp: 90 tablet, Rfl: 1 .  Ginkgo Biloba 40 MG TABS, Take by mouth., Disp: , Rfl:  .  hydrocortisone (ANUSOL-HC) 25 MG suppository, Place 25 mg rectally 2 (two) times daily as needed for hemorrhoids or anal itching., Disp: , Rfl:  .  Melatonin 3 MG TABS, Take 1 tablet by mouth at bedtime., Disp: , Rfl:  .  metFORMIN (GLUCOPHAGE-XR) 500 MG 24 hr tablet, TAKE 1 TABLET BY MOUTH EVERY DAY WITH BREAKFAST, Disp: 90 tablet, Rfl: 1 .  nystatin (MYCOSTATIN/NYSTOP) powder, Apply topically 2 (two) times daily. To rash until  resolved., Disp: 15 g, Rfl: 0 .  tiaGABine (GABITRIL) 4 MG tablet, Takes 4mg  in the AM and 12mg  at bedtime, Disp: , Rfl:  .  trimethoprim (TRIMPEX) 100 MG tablet, Take 1 tablet (100 mg total) by mouth daily., Disp: 30 tablet, Rfl: 2 .  zolpidem (AMBIEN) 10 MG tablet, Take 1 tablet (10 mg total) by mouth at bedtime as needed for sleep., Disp: 30 tablet, Rfl: 5 .  [START ON 04/12/2019] varenicline (CHANTIX CONTINUING MONTH PAK) 1 MG tablet, Take 1 tablet (1 mg total) by mouth 2 (two) times daily., Disp: 60 tablet, Rfl: 2 .  varenicline (CHANTIX STARTING MONTH PAK) 0.5 MG X 11 & 1 MG X 42 tablet, Take one 0.5 mg tablet by mouth once daily for 3 days, then increase to one 0.5 mg tablet twice daily for 4 days, then increase to one 1 mg tablet twice daily., Disp: 53 tablet, Rfl: 0  EXAM:  VITALS per patient if applicable:  GENERAL: alert, oriented, appears well and in no acute distress  HEENT: atraumatic, conjunttiva clear, no obvious abnormalities on inspection of external nose and ears  NECK: normal movements of the head and neck  LUNGS: on inspection no signs of respiratory distress, breathing rate appears normal, no obvious gross SOB, gasping or wheezing  CV: no obvious cyanosis  MS: moves all visible extremities without noticeable abnormality  PSYCH/NEURO: pleasant and cooperative, no obvious depression or anxiety, speech and thought processing grossly intact  ASSESSMENT AND PLAN:  Discussed the following assessment and plan:  Tobacco abuse  Encounter for tobacco use cessation counseling  Tobacco abuse New onset,  Secondary to COVID 19 pandemic .  She is planning to quit with the use of Chantix   Encounter for tobacco use cessation counseling prescribing chantix. Risks and benefitts reviewed with patinet.  Advised to use for a minimum of 3 months     I discussed the assessment and treatment plan with the patient. The patient was provided an opportunity to ask questions and all  were answered. The patient agreed with the plan and demonstrated an understanding of the instructions.   The patient was advised to call back or seek an in-person evaluation if the symptoms worsen or if the condition fails to improve as anticipated.  I provided  25 minutes of non-face-to-face time during this encounter reviewing patient's current problems and prior attempts at quitting tobacco ,  Providing counseling on the above mentioned problems , and coordination  of care .  [name], MD

## 2019-03-13 NOTE — Assessment & Plan Note (Signed)
New onset,  Secondary to COVID 19 pandemic .  She is planning to quit with the use of Chantix

## 2019-03-13 NOTE — Assessment & Plan Note (Signed)
prescribing chantix. Risks and benefitts reviewed with patinet.  Advised to use for a minimum of 3 months

## 2019-03-24 ENCOUNTER — Ambulatory Visit
Admission: RE | Admit: 2019-03-24 | Discharge: 2019-03-24 | Disposition: A | Discharge status: Home / Self Care | Payer: BC Managed Care – PPO | Source: Ambulatory Visit | Attending: Surgery | Admitting: Surgery

## 2019-03-24 ENCOUNTER — Other Ambulatory Visit: Payer: Self-pay

## 2019-03-24 ENCOUNTER — Other Ambulatory Visit (HOSPITAL_COMMUNITY): Payer: Self-pay | Admitting: Diagnostic Radiology

## 2019-03-24 DIAGNOSIS — R9389 Abnormal findings on diagnostic imaging of other specified body structures: Secondary | ICD-10-CM

## 2019-03-24 MED ORDER — GADOBUTROL 1 MMOL/ML IV SOLN
7.0000 mL | Freq: Once | INTRAVENOUS | Status: AC | PRN
  Administered 2019-03-24: 7 mL via INTRAVENOUS

## 2019-04-20 MED ORDER — CIPROFLOXACIN HCL 500 MG PO TABS: 500.0000 mg | ORAL_TABLET | Freq: Two times a day (BID) | ORAL | 2 refills | Status: DC

## 2019-04-20 NOTE — Telephone Encounter (Signed)
If she is doing better on cipro she does not need to be seen. OK to refill the cipro prescription with 2 refills for PRN UTIs. Keep Jan 18th, ok to [facility] for a virtual if she is not improving or has other concerns. If not improving on cipro she should be seen with [facility] this week, but sounds like she has improved, thanks  Nickolas Madrid, MD 04/20/2019

## 2019-05-03 ENCOUNTER — Other Ambulatory Visit: Payer: Self-pay | Admitting: Internal Medicine

## 2019-05-04 ENCOUNTER — Other Ambulatory Visit: Payer: Self-pay | Admitting: Internal Medicine

## 2019-05-04 NOTE — Telephone Encounter (Signed)
Refilled: 04/12/2019 Last OV: 03/13/2019 Next OV: not schdeuled

## 2019-05-06 DIAGNOSIS — N029 Recurrent and persistent hematuria with unspecified morphologic changes: Secondary | ICD-10-CM | POA: Insufficient documentation

## 2019-05-11 ENCOUNTER — Ambulatory Visit: Payer: BC Managed Care – PPO | Admitting: Urology

## 2019-05-18 ENCOUNTER — Other Ambulatory Visit: Payer: Self-pay

## 2019-05-18 DIAGNOSIS — N390 Urinary tract infection, site not specified: Secondary | ICD-10-CM

## 2019-05-19 ENCOUNTER — Other Ambulatory Visit: Payer: Self-pay

## 2019-05-19 ENCOUNTER — Other Ambulatory Visit
Admission: RE | Admit: 2019-05-19 | Discharge: 2019-05-19 | Disposition: A | Discharge status: Home / Self Care | Payer: BC Managed Care – PPO | Attending: Urology | Admitting: Urology

## 2019-05-19 ENCOUNTER — Encounter: Payer: Self-pay | Admitting: Urology

## 2019-05-19 ENCOUNTER — Ambulatory Visit: Payer: BC Managed Care – PPO | Admitting: Urology

## 2019-05-19 VITALS — BP 118/65 | HR 86 | Ht 67.0 in | Wt 155.0 lb

## 2019-05-19 DIAGNOSIS — N390 Urinary tract infection, site not specified: Secondary | ICD-10-CM

## 2019-05-19 LAB — URINALYSIS, COMPLETE (UACMP) WITH MICROSCOPIC
Bilirubin Urine: NEGATIVE
Glucose, UA: NEGATIVE mg/dL
Ketones, ur: NEGATIVE mg/dL
Leukocytes,Ua: NEGATIVE
Nitrite: NEGATIVE
Protein, ur: NEGATIVE mg/dL
Specific Gravity, Urine: >1.03 — ABNORMAL HIGH (ref 1.005–1.030)
pH: 5.5 (ref 5.0–8.0)

## 2019-05-19 MED ORDER — CIPROFLOXACIN HCL 500 MG PO TABS: 500.0000 mg | ORAL_TABLET | Freq: Two times a day (BID) | ORAL | 3 refills | Status: DC | PRN

## 2019-05-19 MED ORDER — TRIMETHOPRIM 100 MG PO TABS: 100.0000 mg | ORAL_TABLET | ORAL | 3 refills | Status: DC | PRN

## 2019-05-19 NOTE — Progress Notes (Signed)
   05/19/2019 3:08 PM   Gwendolyn [name] June 14, 1970 [id number]  Reason for visit: Follow up rUTIs  HPI: I saw Ms. Gwendolyn [name] back in urology clinic for follow-up of recurrent UTIs.  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  She also has had 5 UTIs over the last year that coincided with a new sexual partner and sexual activity.  She also has a history of breast cancer and is undergoing treatment for this.  At our last visit in October 2020, we had started cranberry tablet prophylaxis as well as trimethoprim nightly to help break her UTI cycle.  She reports only one UTI over the last 4 months, and is happy with how she is doing.  I recommended transitioning to post-coital trimethoprim prophylaxis from daily prophylaxis, and she is in agreement.   RTC 1 year for symptom check  A total of 20 minutes were spent face-to-face with the patient, greater than 50% was spent in patient education, counseling, and coordination of care regarding recurrent UTIs.  Billey Co, [facility] [address] [place], [place] [address] [phone]

## 2019-05-19 NOTE — Patient Instructions (Signed)
Take trimethoprim right before or after sexual activity to help prevent UTIs

## 2019-05-27 ENCOUNTER — Other Ambulatory Visit: Payer: Self-pay | Admitting: Internal Medicine

## 2019-05-27 MED ORDER — SULFAMETHOXAZOLE-TRIMETHOPRIM 800-160 MG PO TABS: 1.0000 | ORAL_TABLET | Freq: Two times a day (BID) | ORAL | 0 refills | Status: DC

## 2019-05-29 ENCOUNTER — Other Ambulatory Visit: Payer: BC Managed Care – PPO

## 2019-06-01 ENCOUNTER — Encounter: Payer: Self-pay | Admitting: Internal Medicine

## 2019-06-01 ENCOUNTER — Telehealth: Payer: Self-pay | Admitting: Internal Medicine

## 2019-06-01 NOTE — Telephone Encounter (Signed)
Pt did have an office visit on 03/13/2019 to discuss smoking cessation. Letter has been printed and placed in quick sign folder.

## 2019-06-01 NOTE — Telephone Encounter (Signed)
The patient is requesting a letter stating that she was seen in the office for smoking cessation.

## 2019-06-03 NOTE — Telephone Encounter (Signed)
Per the patient it was ok to E-mail letter to her G mail.

## 2019-06-18 ENCOUNTER — Ambulatory Visit: Payer: BC Managed Care – PPO | Attending: Internal Medicine

## 2019-06-18 DIAGNOSIS — Z23 Encounter for immunization: Secondary | ICD-10-CM | POA: Insufficient documentation

## 2019-06-18 NOTE — Progress Notes (Signed)
   Covid-19 Vaccination Clinic  Name:  Gwendolyn [name]    MRN: [id number] DOB: [birth year]  06/18/2019  Ms. Gwendolyn [name] was observed post Covid-19 immunization for 15 minutes without incidence. She was provided with Vaccine Information Sheet and instruction to access the V-Safe system.   Ms. Gwendolyn [name] was instructed to call 911 with any severe reactions post vaccine: Marland Kitchen Difficulty breathing  . Swelling of your face and throat  . A fast heartbeat  . A bad rash all over your body  . Dizziness and weakness    Immunizations Administered    Name Date Dose VIS Date Route   Pfizer COVID-19 Vaccine 06/18/2019 11:46 AM 0.3 mL 04/03/2019 Intramuscular   Manufacturer: [place]   Lot: [id number]   [place]: [id number]

## 2019-06-19 ENCOUNTER — Other Ambulatory Visit: Payer: Self-pay | Admitting: Internal Medicine

## 2019-06-19 MED ORDER — VARENICLINE TARTRATE 1 MG PO TABS: 1.0000 mg | ORAL_TABLET | Freq: Two times a day (BID) | ORAL | 5 refills | Status: DC

## 2019-07-06 ENCOUNTER — Other Ambulatory Visit: Payer: Self-pay | Admitting: Internal Medicine

## 2019-07-06 DIAGNOSIS — M797 Fibromyalgia: Secondary | ICD-10-CM

## 2019-07-12 ENCOUNTER — Other Ambulatory Visit: Payer: Self-pay | Admitting: Internal Medicine

## 2019-07-12 MED ORDER — SULFAMETHOXAZOLE-TRIMETHOPRIM 800-160 MG PO TABS: 1.0000 | ORAL_TABLET | Freq: Two times a day (BID) | ORAL | 0 refills | Status: DC

## 2019-07-15 ENCOUNTER — Ambulatory Visit: Payer: BC Managed Care – PPO | Attending: Internal Medicine

## 2019-07-15 DIAGNOSIS — Z23 Encounter for immunization: Secondary | ICD-10-CM

## 2019-07-15 NOTE — Progress Notes (Signed)
   Covid-19 Vaccination Clinic  Name:  Gwendolyn [name]    MRN: [id number] DOB: 04-26-1970  07/15/2019  [name] was observed post Covid-19 immunization for 15 minutes without incident. She was provided with Vaccine Information Sheet and instruction to access the V-Safe system.   [name] was instructed to call 911 with any severe reactions post vaccine: Marland Kitchen Difficulty breathing  . Swelling of face and throat  . A fast heartbeat  . A bad rash all over body  . Dizziness and weakness   Immunizations Administered    Name Date Dose VIS Date Route   Pfizer COVID-19 Vaccine 07/15/2019 11:45 AM 0.3 mL 04/03/2019 Intramuscular   Manufacturer: [place]   Lot: [id number]   [place]: [id number]

## 2019-07-21 ENCOUNTER — Other Ambulatory Visit: Payer: Self-pay | Admitting: Internal Medicine

## 2019-08-09 ENCOUNTER — Encounter: Payer: Self-pay | Admitting: Physician Assistant

## 2019-08-09 ENCOUNTER — Telehealth: Payer: BC Managed Care – PPO | Admitting: Physician Assistant

## 2019-08-09 DIAGNOSIS — R102 Pelvic and perineal pain: Secondary | ICD-10-CM

## 2019-08-09 DIAGNOSIS — B9689 Other specified bacterial agents as the cause of diseases classified elsewhere: Secondary | ICD-10-CM

## 2019-08-09 DIAGNOSIS — N760 Acute vaginitis: Secondary | ICD-10-CM | POA: Diagnosis not present

## 2019-08-09 MED ORDER — METRONIDAZOLE 500 MG PO TABS: 500.0000 mg | ORAL_TABLET | Freq: Two times a day (BID) | ORAL | 0 refills | Status: DC

## 2019-08-09 NOTE — Progress Notes (Signed)
We are sorry that you are not feeling well. Here is how we plan to help! Based on what you shared with me it looks like you: May have a vaginosis due to bacteria   Please have a face to face evaluation of your pelvic pain. If pain is severe, please go to the ER  Vaginosis is an inflammation of the vagina that can result in discharge, itching and pain. The cause is usually a change in the normal balance of vaginal bacteria or an infection. Vaginosis can also result from reduced estrogen levels after menopause.  The most common causes of vaginosis are:   Bacterial vaginosis which results from an overgrowth of one on several organisms that are normally present in your vagina.   Yeast infections which are caused by a naturally occurring fungus called candida.   Vaginal atrophy (atrophic vaginosis) which results from the thinning of the vagina from reduced estrogen levels after menopause.   Trichomoniasis which is caused by a parasite and is commonly transmitted by sexual intercourse.  Factors that increase your risk of developing vaginosis include: Marland Kitchen Medications, such as antibiotics and steroids . Uncontrolled diabetes . Use of hygiene products such as bubble bath, vaginal spray or vaginal deodorant . Douching . Wearing damp or tight-fitting clothing . Using an intrauterine device (IUD) for birth control . Hormonal changes, such as those associated with pregnancy, birth control pills or menopause . Sexual activity . Having a sexually transmitted infection  Your treatment plan is Metronidazole or Flagyl 500mg  twice a day for 7 days.  I have electronically sent this prescription into the pharmacy that you have chosen.  Be sure to take all of the medication as directed. Stop taking any medication if you develop a rash, tongue swelling or shortness of breath. Mothers who are breast feeding should consider pumping and discarding their breast milk while on these antibiotics. However, there is no  consensus that infant exposure at these doses would be harmful.  Remember that medication creams can weaken latex condoms. Marland Kitchen   HOME CARE:  Good hygiene may prevent some types of vaginosis from recurring and may relieve some symptoms:  . Avoid baths, hot tubs and whirlpool spas. Rinse soap from your outer genital area after a shower, and dry the area well to prevent irritation. Don't use scented or harsh soaps, such as those with deodorant or antibacterial action. Marland Kitchen Avoid irritants. These include scented tampons and pads. . Wipe from front to back after using the toilet. Doing so avoids spreading fecal bacteria to your vagina.  Other things that may help prevent vaginosis include:  Marland Kitchen Don't douche. Your vagina doesn't require cleansing other than normal bathing. Repetitive douching disrupts the normal organisms that reside in the vagina and can actually increase your risk of vaginal infection. Douching won't clear up a vaginal infection. . Use a latex condom. Both female and female latex condoms may help you avoid infections spread by sexual contact. . Wear cotton underwear. Also wear pantyhose with a cotton crotch. If you feel comfortable without it, skip wearing underwear to bed. Yeast thrives in [employer] Your symptoms should improve in the next day or two.  GET HELP RIGHT AWAY IF:  . You have pain in your lower abdomen ( pelvic area or over your ovaries) . You develop nausea or vomiting . You develop a fever . Your discharge changes or worsens . You have persistent pain with intercourse . You develop shortness of breath, a rapid pulse, or you  faint.  These symptoms could be signs of problems or infections that need to be evaluated by a medical provider now.  MAKE SURE YOU    Understand these instructions.  Will watch your condition.  Will get help right away if you are not doing well or get worse.  Your e-visit answers were reviewed by a board certified advanced  clinical practitioner to complete your personal care plan. Depending upon the condition, your plan could have included both over the counter or prescription medications. Please review your pharmacy choice to make sure that you have choses a pharmacy that is open for you to pick up any needed prescription, Your safety is important to [place]. If you have drug allergies check your prescription carefully.   You can use MyChart to ask questions about today's visit, request a non-urgent call back, or ask for a work or school excuse for 24 hours related to this e-Visit. If it has been greater than 24 hours you will need to follow up with your provider, or enter a new e-Visit to address those concerns. You will get a MyChart message within the next two days asking about your experience. I hope that your e-visit has been valuable and will speed your recovery.  I spent 5-10 minutes on review and completion of this note- Lacy Duverney [facility]

## 2019-08-16 ENCOUNTER — Other Ambulatory Visit: Payer: Self-pay | Admitting: Internal Medicine

## 2019-09-07 ENCOUNTER — Encounter: Payer: Self-pay | Admitting: Hematology

## 2019-09-08 ENCOUNTER — Other Ambulatory Visit: Payer: Self-pay | Admitting: Nurse Practitioner

## 2019-09-08 ENCOUNTER — Telehealth: Payer: Self-pay | Admitting: Hematology

## 2019-09-08 DIAGNOSIS — D0511 Intraductal carcinoma in situ of right breast: Secondary | ICD-10-CM

## 2019-09-08 NOTE — Telephone Encounter (Signed)
Scheduled per 5/18 sch message. Unable to reach pt. Left voicemail- appt is on 6/28.

## 2019-09-16 ENCOUNTER — Encounter: Payer: Self-pay | Admitting: Nurse Practitioner

## 2019-09-16 ENCOUNTER — Other Ambulatory Visit: Payer: Self-pay | Admitting: Internal Medicine

## 2019-09-16 DIAGNOSIS — M25552 Pain in left hip: Secondary | ICD-10-CM

## 2019-09-24 ENCOUNTER — Other Ambulatory Visit: Payer: Self-pay

## 2019-09-24 ENCOUNTER — Ambulatory Visit: Payer: BC Managed Care – PPO | Admitting: Family Medicine

## 2019-09-24 ENCOUNTER — Ambulatory Visit: Payer: BC Managed Care – PPO | Admitting: Internal Medicine

## 2019-09-24 ENCOUNTER — Ambulatory Visit: Payer: Self-pay

## 2019-09-24 ENCOUNTER — Encounter: Payer: Self-pay | Admitting: Family Medicine

## 2019-09-24 ENCOUNTER — Ambulatory Visit (INDEPENDENT_AMBULATORY_CARE_PROVIDER_SITE_OTHER): Payer: BC Managed Care – PPO

## 2019-09-24 VITALS — BP 100/68 | HR 89 | Ht 67.0 in | Wt 168.0 lb

## 2019-09-24 DIAGNOSIS — M25552 Pain in left hip: Secondary | ICD-10-CM

## 2019-09-24 DIAGNOSIS — M79674 Pain in right toe(s): Secondary | ICD-10-CM | POA: Diagnosis not present

## 2019-09-24 NOTE — Patient Instructions (Addendum)
You had a L iliac crest injection today.  Call or go to the ER if you develop a large red swollen joint with extreme pain or oozing puss.   Use voltaren gel on the hip 4x daily as needed.  Do the stretches and exercise.  If not improving in 2 weeks or so let me know and I will order PT in the [place] area.   Consider buddy taping the toe.    How to Buddy Tape Buddy taping refers to taping an injured finger or toe to an uninjured finger or toe that is next to it. This protects the injured finger or toe and keeps it from moving while the injury heals. You may buddy tape a finger or toe if you have a minor sprain. Your health care provider may buddy tape your finger or toe if you have a sprain, dislocation, or fracture. You may be told to replace your buddy taping as needed. What are the risks? Generally, buddy taping is safe. However, problems may occur, such as:  Skin injury or infection.  Reduced blood flow to the finger or toe.  Skin reaction to the tape. Do not buddy tape your toe if you have diabetes. Do not buddy tape if you know that you have an allergy to adhesives or surgical tape. Supplies needed:  Gauze pad, cotton, or cloth.  Tape. This may be called first-aid tape, surgical tape, or medical tape. How to buddy tape Before buddy taping Lessen any pain and swelling with rest, icing, and elevation:  Avoid activities that cause pain.  If directed, put ice on the injured area: ? Put ice in a plastic bag. ? Place a towel between your skin and the bag. ? Leave the ice on for 20 minutes, 2-3 times a day.  Raise (elevate) your hand or foot above the level of your heart while you are sitting or lying down.  Buddy taping procedure   Clean and dry your finger or toe as told by your health care provider.  Place a gauze pad or a piece of cloth or cotton between your injured finger or toe and the uninjured finger or toe.  Use tape to wrap around both fingers or toes so  your injured finger or toe is secured to the uninjured finger or toe. ? The tape should be snug, but not tight. ? Make sure the ends of the piece of tape overlap. ? Avoid placing tape directly over the joint.  Change the tape and the padding as told by your health care provider. Remove and replace the tape or padding if it becomes loose, worn, dirty, or wet. After buddy taping  Watch the buddy-taped area and always remove buddy taping if your: ? Pain gets worse. ? Fingers or toes turn pale or blue. ? Skin becomes irritated. Follow these instructions at home:  Take over-the-counter and prescription medicines only as told by your health care provider.  Return to your normal activities as told by your health care provider. Ask your health care provider what activities are safe for you. Contact a health care provider if:  You have pain, swelling, or bruising that lasts longer than 3 days.  You have a fever.  Your skin is red, cracked, or irritated. Get help right away if:  The injured area becomes cold, numb, or pale.  You have severe pain, swelling, bruising, or loss of movement in your finger or toe.  Your finger or toe changes shape (deformity). Summary  Buddy  taping refers to taping an injured finger or toe to an uninjured finger or toe that is next to it.  You may buddy tape a finger or toe if you have a minor sprain.  Take over-the-counter and prescription medicines only as told by your health care provider. This information is not intended to replace advice given to you by your health care provider. Make sure you discuss any questions you have with your health care provider. Document Revised: 07/31/2018 Document Reviewed: 04/21/2018 Elsevier Patient Education  [place].

## 2019-09-24 NOTE — Progress Notes (Signed)
Subjective:    I'm seeing this patient as a consultation for:  Dr. Derrel Nip. Note will be routed back to referring provider/PCP.  CC: L hip pain and R pinky toe  I, Molly Weber, LAT, ATC, am serving as scribe for Dr. [name].  HPI: Pt is a 49 y/o female presenting w/ c/o L hip pain x 4-6 weeks.  She participates in 4-5 aerobic dance classes/week.  She locates her pain to her L lateral iliac crest.  She rates her pain as mildnand describes her pain as burning.  Radiating pain: yes to L mid thigh L hip mechanical symptoms: No Aggravating factors: shifting her hips to the L Treatments tried: meloxicam; ice; Epsom salts  R pinky toe: Stubbed her R pinky to on Saturday and cannot wear a closed to shoe.  Some swelling  Past medical history, Surgical history, Family history, Social history, Allergies, and medications have been entered into the medical record, reviewed. History breast cancer  Review of Systems: No new headache, visual changes, nausea, vomiting, diarrhea, constipation, dizziness, abdominal pain, skin rash, fevers, chills, night sweats, weight loss, swollen lymph nodes, body aches, joint swelling, muscle aches, chest pain, shortness of breath, mood changes, visual or auditory hallucinations.   Objective:    Vitals:   09/24/19 1104  BP: 100/68  Pulse: 89  SpO2: 97%   General: Well Developed, well nourished, and in no acute distress.  Neuro/Psych: Alert and oriented x3, extra-ocular muscles intact, able to move all 4 extremities, sensation grossly intact. Skin: Warm and dry, no rashes noted.  Respiratory: Not using accessory muscles, speaking in full sentences, trachea midline.  Cardiovascular: Pulses palpable, no extremity edema. Abdomen: Does not appear distended. MSK:  Left hip normal-appearing Normal motion. Tender palpation left iliac crest.  Minimally tender greater trochanter. Hip abduction strength diminished 4/5 with pain. External rotation internal rotation  adduction strength excellent. Right hip normal-appearing Nontender normal motion normal strength.  Right foot normal-appearing tender palpation fifth toe. Pulses cap refill and sensation are intact distally.  Lab and Radiology Results X-ray images right foot obtained today personally and independently reviewed No acute fractures Await formal radiology review  Diagnostic Limited MSK Ultrasound of: Left hip Area of tenderness at iliac crest visualized with tiny avulsion fragment at iliac crest.  No muscle disruption. Impression: Possible tiny avulsion at iliac crest left side  Procedure: Real-time Ultrasound Guided Injection of left iliac crest area of tenderness Device: Philips Affiniti 50G Images permanently stored and available for review in the ultrasound unit. Verbal informed consent obtained.  Discussed risks and benefits of procedure. Warned about infection bleeding damage to structures skin hypopigmentation and fat atrophy among others. Patient expresses understanding and agreement Time-out conducted.   Noted no overlying erythema, induration, or other signs of local infection.   Skin prepped in a sterile fashion.   Local anesthesia: Topical Ethyl chloride.   With sterile technique and under real time ultrasound guidance:  40 mg of Kenalog and 3 mL of Marcaine injected easily.   Completed without difficulty   Pain immediately resolved suggesting accurate placement of the medication.   Advised to call if fevers/chills, erythema, induration, drainage, or persistent bleeding.   Images permanently stored and available for review in the ultrasound unit.  Impression: Technically successful ultrasound guided injection.     Impression and Recommendations:    Assessment and Plan: 49 y.o. female with left lateral hip pain and iliac crest.  Consistent with tendinopathy/tiny avulsion fracture at iliac crest.  Given  the pain ongoing 4 to 6 weeks at this point I think it is more likely  to be tendinopathy than true fracture.  Plan for injection as above along with home exercise program and Voltaren gel.  If not improving on her own in a few weeks she will let me know and I will proceed with formal physical therapy referral to [facility] area.  Check back 6 weeks if not better.  Would obtain x-ray on recheck and likely MRI given breast cancer history.  Fifth toe injury fortunately no fracture on x-ray today.  Plan for buddy tape as needed and watchful waiting recheck back if not improved.Marland Kitchen  PDMP not reviewed this encounter. Orders Placed This Encounter  Procedures   [place] LIMITED JOINT SPACE STRUCTURES LOW LEFT(NO LINKED CHARGES)    Order Specific Question:   Reason for Exam (SYMPTOM  OR DIAGNOSIS REQUIRED)    Answer:   L lateral hip pain    Order Specific Question:   Preferred imaging location?    Answer:   [place]   DG Foot Complete Right    Standing Status:   Future    Number of Occurrences:   1    Standing Expiration Date:   09/23/2020    Order Specific Question:   Reason for Exam (SYMPTOM  OR DIAGNOSIS REQUIRED)    Answer:   eval 5th toe    Order Specific Question:   Is patient pregnant?    Answer:   No    Order Specific Question:   Preferred imaging location?    Answer:   [facility]    Order Specific Question:   Radiology Contrast Protocol - do NOT remove file path    Answer:   \[email or web address]\Radiant\DXFluoroContrastProtocols.pdf   No orders of the defined types were placed in this encounter.   Discussed warning signs or symptoms. Please see discharge instructions. Patient expresses understanding.   The above documentation has been reviewed and is accurate and complete [name], M.D.

## 2019-09-25 NOTE — Progress Notes (Signed)
Possible fracture at the small bone at the end of the toe.  To my eyes this is hard to see and I am not sure if it is real or not.  Regardless there is not much to do for it besides buddy tape if needed.

## 2019-10-01 ENCOUNTER — Encounter: Payer: Self-pay | Admitting: Nurse Practitioner

## 2019-10-01 ENCOUNTER — Other Ambulatory Visit: Payer: Self-pay

## 2019-10-01 DIAGNOSIS — E7800 Pure hypercholesterolemia, unspecified: Secondary | ICD-10-CM

## 2019-10-01 DIAGNOSIS — Z0000 Encounter for general adult medical examination without abnormal findings: Secondary | ICD-10-CM

## 2019-10-02 NOTE — Telephone Encounter (Signed)
Pt would like to have labs drawn before her physical on 10/19/2019. I have ordered TSh, CBC, A1c, CMP, and lipid panel. Is there anything else that needs to ordered?

## 2019-10-04 ENCOUNTER — Encounter: Payer: Self-pay | Admitting: Family Medicine

## 2019-10-05 ENCOUNTER — Other Ambulatory Visit: Payer: Self-pay | Admitting: Nurse Practitioner

## 2019-10-05 DIAGNOSIS — D0511 Intraductal carcinoma in situ of right breast: Secondary | ICD-10-CM

## 2019-10-06 ENCOUNTER — Ambulatory Visit: Payer: Self-pay

## 2019-10-06 ENCOUNTER — Other Ambulatory Visit: Payer: Self-pay

## 2019-10-06 ENCOUNTER — Ambulatory Visit (INDEPENDENT_AMBULATORY_CARE_PROVIDER_SITE_OTHER): Payer: BC Managed Care – PPO

## 2019-10-06 ENCOUNTER — Encounter: Payer: Self-pay | Admitting: Family Medicine

## 2019-10-06 ENCOUNTER — Ambulatory Visit: Payer: BC Managed Care – PPO | Admitting: Family Medicine

## 2019-10-06 VITALS — BP 110/72 | HR 72 | Ht 67.0 in | Wt 167.0 lb

## 2019-10-06 DIAGNOSIS — M25552 Pain in left hip: Secondary | ICD-10-CM | POA: Diagnosis not present

## 2019-10-06 DIAGNOSIS — M76892 Other specified enthesopathies of left lower limb, excluding foot: Secondary | ICD-10-CM

## 2019-10-06 NOTE — Patient Instructions (Signed)
Thank you for coming in today. Get an xray today on your way out.  Start PT.  Recheck in 4 weeks.  Let me know if worsening.  Insurance for MRI approval is looking for 6 week trial of conservative management and an Xray.

## 2019-10-06 NOTE — Progress Notes (Signed)
I, [name], LAT, ATC, am serving as scribe for Dr. [name].  Gwendolyn [name] is a 49 y.o. female who presents to [place] at [facility] today for f/u of L lateral hip pain.  She was last seen by Dr. Georgina Snell on 09/24/19 and had a L iliac crest injection and was shown a HEP by Dr. Georgina Snell.  Since her last visit, she reports having suffered a new injury while doing a dance class on Saturday when she felt a pop in her L hip that caused immediate pain.  She reports overall increased pain since her last visit and is limping when she walks.  Her symptoms are worsened w/ L LE weight bearing.  She does note that her symptoms today are better then they were on Saturday.  She denies any con't L hip mechanical symptoms.   Pertinent review of systems: No fevers or chills  Relevant historical information: History of breast cancer   Exam:  BP 110/72 (BP Location: Left Arm, Patient Position: Sitting, Cuff Size: Normal)   Pulse 72   Ht 5\' 7"  (1.702 m)   Wt 167 lb (75.8 kg)   SpO2 97%   BMI 26.16 kg/m  General: Well Developed, well nourished, and in no acute distress.   MSK: Left hip normal-appearing normal motion. Tender palpation at greater trochanter.  Minimally tender at iliac crest. Hip abduction strength diminished 4/5 with mild pain Hip external rotation strength diminished 3/5 with moderate to severe pain. Antalgic gait present.    Lab and Radiology Results X-ray images left hip obtained today personally and independently reviewed No acute fractures or severe degenerative changes.  Mild enthesopathy changes at trochanter and iliac crest Await formal radiology review  Diagnostic Limited MSK Ultrasound of: Left lateral hip greater trochanter Mild trochanteric bursitis visible.  And insertion of piriformis tendon mild hyperechoic change consistent with partial avulsion versus enthesopathy. Impression: Trochanteric bursitis and abductor/external rotator  tendinitis  Procedure: Real-time Ultrasound Guided Injection of left lateral hip greater trochanter insertion piriformis tendon Device: Philips Affiniti 50G Images permanently stored and available for review in the ultrasound unit. Verbal informed consent obtained.  Discussed risks and benefits of procedure. Warned about infection bleeding damage to structures skin hypopigmentation and fat atrophy among others. Patient expresses understanding and agreement Time-out conducted.   Noted no overlying erythema, induration, or other signs of local infection.   Skin prepped in a sterile fashion.   Local anesthesia: Topical Ethyl chloride.   With sterile technique and under real time ultrasound guidance:  40 mg of Kenalog and 2 mL of Marcaine injected easily.   Completed without difficulty   Pain immediately resolved suggesting accurate placement of the medication.   Advised to call if fevers/chills, erythema, induration, drainage, or persistent bleeding.   Images permanently stored and available for review in the ultrasound unit.  Impression: Technically successful ultrasound guided injection.        Assessment and Plan: 49 y.o. female with left lateral hip pain worsening recently.  Separate but related injury today compared to June 3.  Plan for injection as above and dedicated physical therapy.  X-ray obtained and overread is pending.  If not improved would proceed with MRI.   PDMP not reviewed this encounter. Orders Placed This Encounter  Procedures  . [place] LIMITED JOINT SPACE STRUCTURES LOW LEFT(NO LINKED CHARGES)    Order Specific Question:   Reason for Exam (SYMPTOM  OR DIAGNOSIS REQUIRED)    Answer:   L hip pain  Order Specific Question:   Preferred imaging location?    Answer:   [place]  . DG Hip Unilat W OR W/O Pelvis 2-3 Views Left    Standing Status:   Future    Number of Occurrences:   1    Standing Expiration Date:   10/05/2020    Order  Specific Question:   Reason for Exam (SYMPTOM  OR DIAGNOSIS REQUIRED)    Answer:   eval lateral hip pain hx breast cancer    Order Specific Question:   Is patient pregnant?    Answer:   No    Order Specific Question:   Preferred imaging location?    Answer:   [facility]    Order Specific Question:   Radiology Contrast Protocol - do NOT remove file path    Answer:   \\[email or web address]\DXFluoroContrastProtocols.pdf  . Ambulatory referral to Physical Therapy    Referral Priority:   Routine    Referral Type:   Physical Medicine    Referral Reason:   Specialty Services Required    Requested Specialty:   Physical Therapy   No orders of the defined types were placed in this encounter.    Discussed warning signs or symptoms. Please see discharge instructions. Patient expresses understanding.   The above documentation has been reviewed and is accurate and complete [name], M.D.

## 2019-10-07 NOTE — Progress Notes (Signed)
X-ray hip and pelvis shows normal-appearing bones at the hip joint.  No signs of cancer in the pelvis or hips.  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  However you are not hurting in this area at all.

## 2019-10-13 ENCOUNTER — Other Ambulatory Visit: Payer: Self-pay

## 2019-10-13 ENCOUNTER — Ambulatory Visit: Payer: BC Managed Care – PPO | Attending: Family Medicine

## 2019-10-13 DIAGNOSIS — M6281 Muscle weakness (generalized): Secondary | ICD-10-CM | POA: Diagnosis present

## 2019-10-13 DIAGNOSIS — M25552 Pain in left hip: Secondary | ICD-10-CM | POA: Diagnosis present

## 2019-10-13 NOTE — Patient Instructions (Signed)
Supine clamshell isometrics  Lying on your bed   Belt around thighs, which are slightly greater than shoulder width apart,    2 pillows under knees   Press knees out against belt with 40 % effort for 1 minute   Rest for 1-2 minutes   Perform 5 times per set.     Do 3 sets per day. Each set to be performed a few hours apart.

## 2019-10-13 NOTE — Therapy (Signed)
[facility] [address] [place], [address] Phone: [phone]   Fax:  [phone]  Physical Therapy Evaluation  Patient Details  Name: Gwendolyn [name] MRN: [id number] Date of Birth: [birth year] Referring Provider (PT): Gregor Hams, MD   Encounter Date: 10/13/2019   PT End of Session - 10/13/19 1520    Visit Number 1    Number of Visits 17    Date for PT Re-Evaluation 12/10/19    PT Start Time 1520    PT Stop Time 1620    PT Time Calculation (min) 60 min    Activity Tolerance Patient tolerated treatment well    Behavior During Therapy [facility] for tasks assessed/performed           Past Medical History:  Diagnosis Date   Anal fissure    Anxiety    Breast cancer ([place]) 2002   lumpectomy-Right   Depression    Family history of brain cancer    Family history of colon cancer    Fibromyalgia    Gallstones    Irritable bowel syndrome    Personal history of radiation therapy    Yeast vaginitis     Past Surgical History:  Procedure Laterality Date   ABDOMINAL HYSTERECTOMY     BREAST BIOPSY Right 2002   Breast cancer DCIS   BREAST BIOPSY     BREAST LUMPECTOMY Right 2003   Radiatiomn Therapy DCIS   CHOLECYSTECTOMY  2000   COLECTOMY  10/31/2012   SECONDARY TO COLONIC INERTIA   REDUCTION MAMMAPLASTY Bilateral 11/13/2018   right breast 88mm DCIS found during breast reduction   SUPRACERVICAL ABDOMINAL HYSTERECTOMY  04/10/2011   But still has ovaries.    There were no vitals filed for this visit.    Subjective Assessment - 10/13/19 1526    Subjective L hip pain 2/10 currently (pt sitting wiht L leg crossed over R), 4/10 at most for the past 2 weeks    Pertinent History L hip pain. Pain began middle of May 2021, gradual onset. Saw MD who gave her a cortisone shot and exercises. Went to a belly dancing class and felt a pop. L leg buckled afterwards. Returned to her MD, got a  cortisone shot and an ultrasound which revealed L hip bursitis in addition to her L hip tendinosis. Pt also has a small fracture in her R pinky toe which began [employer]. Accidentally kicked her toiletry bag. R pinky toe fracture improving. L hip pain improving overall because pt states that she is not doing anything. Likes to be active and not doing anything makes her miserable.  Prior to injury, pt was taking dance clases: belly dancing, latin fusion, [place] African, hula class. Pt also works out with a Physiological scientist, 3 days a week, 30 minute session (a lot of arms and abs). Her MD gave her hip abductor stretch, TFL stretch which really bothers her.    Patient Stated Goals Get back into the gym, return to dancing without limitations.    Currently in Pain? Yes    Pain Score 2     Pain Location Hip    Pain Orientation Left    Pain Descriptors / Indicators Tender;Aching    Pain Type Acute pain    Pain Onset More than a month ago    Pain Frequency Occasional    Aggravating Factors  TFL stretch, L hip adduction    Pain Relieving Factors rest, ice  [facility] PT Assessment - 10/13/19 1526      Assessment   Medical Diagnosis L hip pain, L hip abductor tendinitis    Referring Provider (PT) Gregor Hams, MD    Onset Date/Surgical Date 10/06/19    Prior Therapy No known PT for current condition      Precautions   Precaution Comments No known precautions      Restrictions   Other Position/Activity Restrictions No known restrictions      Balance Screen   Has the patient fallen in the past 6 months No    Has the patient had a decrease in activity level because of a fear of falling?  No    Is the patient reluctant to leave their home because of a fear of falling?  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, off during summer   Vocation Requirements PLOF: able to exercise and dance without L hip pain      Observation/Other Assessments     Observations sit up: reproduced L lateral hip and thigh symptoms    Focus on Therapeutic Outcomes (FOTO)  hip FOTO 43      Posture/Postural Control   Posture Comments protracted neck, R shoulder lower, slight R lateral shift, B foot pronation R > L      AROM   Lumbar Flexion Full    Lumbar Extension WFL, slight R trunk rotation    Lumbar - Right Side Bend WFL    Lumbar - Left Side Bend WFL    Lumbar - Right Rotation WFL    Lumbar - Left Rotation WFL      PROM   Overall PROM Comments hip at 90/90 hip IR L limited with reproduction of pain. R hip IR limited, no pain      Strength   Right Hip Flexion 4/5    Right Hip Extension 4/5    Right Hip ABduction 4+/5    Left Hip Flexion 4/5    Left Hip Extension 4-/5    Left Hip ABduction 4-/5   with symptoms.    Right Knee Flexion 5/5    Right Knee Extension 5/5    Left Knee Flexion 4+/5    Left Knee Extension 5/5      Special Tests   Other special tests  repeated flexion test caused L posterior pelvic symptoms, no L hip pain reproductoin.  (-) Ober's test knee straight and knee bent.   Possible reproduction of symptoms with L piriformis test.   (+) long sit test suggesting anterior nutation of L innominate.       Ambulation/Gait   Gait Comments R pelvic drop during L LE stance phase.                       Objective measurements completed on examination: See above findings.     Observation: decreased bilateral femoral control with sit <> stand Sitting posture: R leg crossed over L, L hip adduction   No latex band allergies  No blood pressure problems per pt.   TTP L glute med tendon, TFL muscle, piriformis muscle tendon with reproduction of symptoms.   No pain with L hip abduction AROM observed.    Therapeutic exercise  Supine hip abduction isometrics pillows under knees, strap resist, 40% effort 1 min with 1 minute rest breaks x 5  Reviewed and given as part of her HEP. Pt demonstrated and verbalized  understanding. Handout provided.    Pt was recommended to use eggshell crate mattress topper as well as maintaining 50% weight bearing each LE when standing and promote femoral control when performing sit <> stand transfers so as to no place un neccessary stress to hip   Sit <> stand with emphasis on femoral control from low mat table 3x   Improved exercise technique, movement at target joints, use of target muscles after mod verbal, visual, tactile cues.   Response to treatment Pt tolerated session well without aggravation of symptoms.   Clinical impression Patient is a 49 year old female who came to physical therapy secondary to L hip pain. She also presents with altered gait pattern, L glute med and max weakness, decreased femoral control, bilateral hip IR limitation, TTP L glute med and piriformis tendon and TFL muscle, and difficulty performing activities which involve hip adduction and IR such as dancing. Pt will benefit from skilled physical therapy services to address the aforementioned deficits.    PT Education - 10/13/19 1825    Education Details ther-ex, HEP, plan of care    Person(s) Educated Patient    Methods Explanation;Demonstration;Tactile cues;Verbal cues;Handout    Comprehension Returned demonstration;Verbalized understanding            PT Short Term Goals - 10/13/19 1826      PT SHORT TERM GOAL #1   Title Patient will be independent with her HEP to decrease pain, improve ability to return to her normal workout routine.    Baseline Pt has started her HEP (10/13/2019)    Time 3    Period Weeks    Status New    Target Date 11/05/19                PT Long Term Goals - 10/13/19 1827      PT LONG TERM GOAL #1   Title Patient will have a decrease in L hip pain to 2/10 or less at worst to promote ability to tolerate activities which involve hip adduction, as well as to promote ability to return to her normal exercise routine.    Baseline 4/10 L hip pain at most  for the past 2 weeks (10/13/2019)    Time 8    Period Weeks    Status New    Target Date 12/10/19      PT LONG TERM GOAL #2   Title Patient will improve L hip abduction and extension strength by at least 1/2 MMT grade to promote ability to perform standing tasks more comfortably.    Baseline L hip abduction 4-/5 with pain, hip extension 4-/5 (10/13/2019)    Time 8    Period Weeks    Status New    Target Date 12/10/19      PT LONG TERM GOAL #3   Title Patient will improve her hip FOTO score by at least 10 points as a demonstration of improved function.    Baseline Hip FOTO 43 (10/13/2019)    Time 8    Period Weeks    Status New    Target Date 12/10/19                  Plan - 10/13/19 1822    Clinical Impression Statement Patient is a 49 year old female who came to physical therapy secondary to L hip pain. She also presents with altered gait pattern, L glute med and max weakness, decreased femoral control, bilateral hip IR limitation, TTP L glute med  and piriformis tendon and TFL muscle, and difficulty performing activities which involve hip adduction and IR such as dancing. Pt will benefit from skilled physical therapy services to address the aforementioned deficits.    Personal Factors and Comorbidities Comorbidity 3+    Comorbidities Hx of breast CA, fibromyalgia, hx of radiation therapy    Examination-Activity Limitations Sleep;Locomotion Level;Stairs    Stability/Clinical Decision Making Stable/Uncomplicated    Clinical Decision Making Low    Rehab Potential Fair    PT Frequency 2x / week    PT Duration 8 weeks    PT Treatment/Interventions Aquatic Therapy;Electrical Stimulation;Iontophoresis 4mg /ml Dexamethasone;Therapeutic activities;Therapeutic exercise;Neuromuscular re-education;Patient/family education;Manual techniques;Dry needling    PT Next Visit Plan isometric, concentric, eccentric glute med, piriformis musle strengthening, lumbopelvic and femoral control, manual  techniques, modalities PRN    Consulted and Agree with Plan of Care Patient           Patient will benefit from skilled therapeutic intervention in order to improve the following deficits and impairments:     Visit Diagnosis: Pain in left hip - Plan: PT plan of care cert/re-cert  Muscle weakness (generalized) - Plan: PT plan of care cert/re-cert     Problem List Patient Active Problem List   Diagnosis Date Noted   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   Tobacco abuse 03/13/2019   Encounter for tobacco use cessation counseling 03/13/2019   Genetic testing 01/19/2019   Family history of brain cancer    Family history of colon cancer    UTI (urinary tract infection) 12/08/2018   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   Hematuria 12/05/2017   Rectal bleeding 09/02/2017   Encounter for preventive health examination 11/22/2016   History of abdominal supracervical subtotal hysterectomy 11/22/2016   Achilles tendinitis of both lower extremities 11/10/2015   Plantar fasciitis 11/10/2015   Obesity (BMI 30.0-34.9) 07/23/2015   Vaginitis 03/18/2015   Menopause 01/18/2015   Hypercholesteremia 10/05/2014   Vitamin D deficiency 10/05/2014   Depressive disorder 10/05/2014   History of breast cancer 10/05/2014   Migraine without aura with status migrainosus 10/05/2014   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, [place],  [address] Phone: [phone]   Fax:  [phone]  Name: Gwendolyn [name] MRN: [id number] Date of Birth: [birth year]

## 2019-10-15 ENCOUNTER — Ambulatory Visit: Payer: BC Managed Care – PPO | Admitting: Physical Therapy

## 2019-10-15 ENCOUNTER — Other Ambulatory Visit: Payer: Self-pay

## 2019-10-15 DIAGNOSIS — M25552 Pain in left hip: Secondary | ICD-10-CM | POA: Diagnosis not present

## 2019-10-15 DIAGNOSIS — M6281 Muscle weakness (generalized): Secondary | ICD-10-CM

## 2019-10-15 NOTE — Therapy (Signed)
[facility] [address], [place], [address] Phone: [phone]   Fax:  [phone]  Physical Therapy Treatment  Patient Details  Name: Gwendolyn [name] MRN: [id number] Date of Birth: 04-15-1971 Referring Provider (PT): Gregor Hams, MD   Encounter Date: 10/15/2019   PT End of Session - 10/15/19 1954    Visit Number 2    Number of Visits 17    Date for PT Re-Evaluation 12/10/19    PT Start Time 0900    PT Stop Time 0940    PT Time Calculation (min) 40 min    Activity Tolerance Patient tolerated treatment well    Behavior During Therapy [facility] for tasks assessed/performed           Past Medical History:  Diagnosis Date  . Anal fissure   . Anxiety   . Breast cancer ([place]) 2002   lumpectomy-Right  . Depression   . Family history of brain cancer   . Family history of colon cancer   . Fibromyalgia   . Gallstones   . Irritable bowel syndrome   . Personal history of radiation therapy   . Yeast vaginitis     Past Surgical History:  Procedure Laterality Date  . ABDOMINAL HYSTERECTOMY    . 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 BREAST BIOPSY    . BREAST LUMPECTOMY Right 2003   Radiatiomn Therapy DCIS  . CHOLECYSTECTOMY  2000  . COLECTOMY  10/31/2012   SECONDARY TO COLONIC INERTIA  . REDUCTION MAMMAPLASTY Bilateral 11/13/2018   right breast 42mm DCIS found during breast reduction  . SUPRACERVICAL ABDOMINAL HYSTERECTOMY  04/10/2011   But still has ovaries.    There were no vitals filed for this visit.   Subjective Assessment - 10/15/19 0907    Subjective Patient reports she is feeling well today. Has 0.5/10 pain in her L hip, but would not classify it as pain but soreness or not quite the same as the R side. She states she has been doing her HEP without pain except the belt she is using is uncomfortable and she wonders if she should purchase something else if she is going to be  doing this for a while. She states performing sit to stand as learned in PT eval is awkward but not painful. States she is really bothered by not being able to dance or participate in her usual fitness activities. Has been avoiding Barre, bellydancing, zumba, and fitness classes and only working out upper body and abs with her Physiological scientist. She wonders if there is anything she can do safely without worsening her hip. Patient has a lot of questions about what may be going on in her hip. She noted that the cortisone shots helped at least some. States she got an egshell crate matress topper and a bolter for between her leg to keep her hip out of adduction.    Pertinent History L hip pain. Pain began middle of May 2021, gradual onset. Saw MD who gave her a cortisone shot and exercises. Went to a belly dancing class and felt a pop. L leg buckled afterwards. Returned to her MD, got a cortisone shot and an ultrasound which revealed L hip bursitis in addition to her L hip tendinosis. Pt also has a small fracture in her R pinky toe which began [employer]. Accidentally kicked her toiletry bag. R pinky toe fracture improving. L hip pain improving overall because pt  states that she is not doing anything. Likes to be active and not doing anything makes her miserable.  Prior to injury, pt was taking dance clases: belly dancing, latin fusion, [place] African, hula class. Pt also works out with a Physiological scientist, 3 days a week, 30 minute session (a lot of arms and abs). Her MD gave her hip abductor stretch, TFL stretch which really bothers her.    Patient Stated Goals Get back into the gym, return to dancing without limitations.    Currently in Pain? Yes    Pain Score 1    "0.5/10"   Pain Location Hip    Pain Orientation Left    Pain Descriptors / Indicators Tender    Pain Type Acute pain    Pain Onset More than a month ago           No latex band allergies  No blood pressure problems per pt.   TTP L  glute med tendon, TFL muscle, piriformis muscle tendon with reproduction of symptoms.  No pain with L hip abduction AROM observed.   Therapeutic exercise: to centralize symptoms and improve ROM, strength, muscular endurance, and activity tolerance required for successful completion of functional activities.   Supine hip abduction isometrics pillows under knees, strap resist, 40% effort 1 min with 1 minute rest breaks x 5   Reviewed ways to use sheet or towel instead of belt to improve comfort.   Education and discussion of symptoms and activities with purpose of listing out what she could likely do without further irritating hip and what she should avoid.   Avoid: anything that crosses the legs (curtsy squat for example), side bridges (tried in clinic and uncomfortable both sides), anything fast or uncontrolled movements or that requires gyration of the hips (belly dancing, zumba, etc), squatting too deep.  Seems OK: squats with equal weight bearing bilateral legs (practiced in clinic), front plank (practiced in clinic), glute bridge with feet spread wide at least hip width apart, including with added weight (practiced in clinic with 15# DB on pelvis), possibly barre exercises from 1st and 2nd position (cautioned against 3rd and especially 5th, reviewed not crossing legs, patient demonstrated some of the activities and was able to perform without pain), double leg heel raises.   Double leg activities much more likely to feel good than single leg.   Exercises trialed and reviewed for technique/pain in the clinic:  - squats and sit <> stands from chair. Educated on proper tracking of knees over toes. Patient naturally allows knees to track medial to toes until asked to squat like her personal trainer showed her and then it is improved. Does have some difficulty with keeping medial forefoot flat with knees tracking properly. May have some tibial torsion that may affect this, but does appear to be lacking  control at the hip and foot as well.  - side planks (unable to perform without pain even with modifications) - front plank (no pain) - supine glute bridge x 7 the added 15# DB resting on pelvis. Able to complete all without hip pain with feet spread just wider than hips.  - single leg Barre gestures (no pain). (recommend double checking if it is bothersome for L LE to be the support leg)  Pt required multimodal cuing for proper technique and to facilitate improved neuromuscular control, strength, range of motion, and functional ability resulting in improved performance and form.  Response to treatment Pt tolerated session well without aggravation of symptoms beyond slight  discomfort with exercises attempted that were uncomfortable; discomfort resoled quickly with discontinuation of those exercises.     PT Education - 10/15/19 1959    Education Details Exercise purpose/form. Self management techniques. Education on diagnosis, prognosis, POC, anatomy and physiology of current condition    Person(s) Educated Patient    Methods Explanation;Demonstration;Tactile cues;Verbal cues;Handout    Comprehension Verbalized understanding;Returned demonstration;Verbal cues required;Tactile cues required;Need further instruction            PT Short Term Goals - 10/13/19 1826      PT SHORT TERM GOAL #1   Title Patient will be independent with her HEP to decrease pain, improve ability to return to her normal workout routine.    Baseline Pt has started her HEP (10/13/2019)    Time 3    Period Weeks    Status New    Target Date 11/05/19             PT Long Term Goals - 10/13/19 1827      PT LONG TERM GOAL #1   Title Patient will have a decrease in L hip pain to 2/10 or less at worst to promote ability to tolerate activities which involve hip adduction, as well as to promote ability to return to her normal exercise routine.    Baseline 4/10 L hip pain at most for the past 2 weeks (10/13/2019)     Time 8    Period Weeks    Status New    Target Date 12/10/19      PT LONG TERM GOAL #2   Title Patient will improve L hip abduction and extension strength by at least 1/2 MMT grade to promote ability to perform standing tasks more comfortably.    Baseline L hip abduction 4-/5 with pain, hip extension 4-/5 (10/13/2019)    Time 8    Period Weeks    Status New    Target Date 12/10/19      PT LONG TERM GOAL #3   Title Patient will improve her hip FOTO score by at least 10 points as a demonstration of improved function.    Baseline Hip FOTO 43 (10/13/2019)    Time 8    Period Weeks    Status New    Target Date 12/10/19                 Plan - 10/15/19 2018    Clinical Impression Statement Patient tolerated treatment well overall and appeared to be relieved to have guidance on how to work around her injury to keep up her fitness and physical activity that she enjoys so much without worsening her condition or delaying healing. Patient appeared to have a good understanding of what type of movement would irritate her hip and how to choose activities where she has good control and ability to catch any pain that may start developing early and before it is too late. Patient was able to complete many exercises in the clinic without pain and was provided with a list of those that seemed fine and those that should be temporarily avoided. Appears to be doing well with isometric loading to the affected tissues and may be ready to move up to the next level of progression next session. Patient would benefit from continued management of limiting condition by skilled physical therapist to address remaining impairments and functional limitations to work towards stated goals and return to PLOF or maximal functional independence.    Personal Factors and Comorbidities Comorbidity 3+  Comorbidities Hx of breast CA, fibromyalgia, hx of radiation therapy    Examination-Activity Limitations Sleep;Locomotion  Level;Stairs    Stability/Clinical Decision Making Stable/Uncomplicated    Rehab Potential Fair    PT Frequency 2x / week    PT Duration 8 weeks    PT Treatment/Interventions Aquatic Therapy;Electrical Stimulation;Iontophoresis 4mg /ml Dexamethasone;Therapeutic activities;Therapeutic exercise;Neuromuscular re-education;Patient/family education;Manual techniques;Dry needling    PT Next Visit Plan isometric, concentric, eccentric glute med, piriformis musle strengthening, lumbopelvic and femoral control, manual techniques, modalities PRN    Consulted and Agree with Plan of Care Patient           Patient will benefit from skilled therapeutic intervention in order to improve the following deficits and impairments:     Visit Diagnosis: Pain in left hip  Muscle weakness (generalized)     Problem List Patient Active Problem List   Diagnosis Date Noted  . 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 Tobacco abuse 03/13/2019  . Encounter for tobacco use cessation counseling 03/13/2019  . Genetic testing 01/19/2019  . Family history of brain cancer   . Family history of colon cancer   . UTI (urinary tract infection) 12/08/2018  . 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  . 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 Hematuria 12/05/2017  . Rectal bleeding 09/02/2017  . Encounter for preventive health examination 11/22/2016  . History of abdominal supracervical subtotal hysterectomy 11/22/2016  . Achilles tendinitis of both lower extremities 11/10/2015  . Plantar fasciitis 11/10/2015  . Obesity (BMI 30.0-34.9) 07/23/2015  . Vaginitis 03/18/2015  . Menopause 01/18/2015  . Hypercholesteremia 10/05/2014  . Vitamin D deficiency 10/05/2014  . Depressive disorder 10/05/2014  . History of breast cancer 10/05/2014  . Migraine without aura with status migrainosus 10/05/2014  . 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 Insomnia 10/05/2014  . Bipolar  2 disorder ([place]) 10/05/2014  . IBS (irritable bowel syndrome) 10/05/2014  . Obstructive sleep apnea 10/05/2014  . S/P colectomy 10/05/2014  . Hypersomnia 10/05/2014  . Anxiety 10/05/2014  . Fibromyalgia 10/05/2014  . Periodic limb movement disorder 10/05/2014    Everlean Alstrom. Graylon Good, PT, DPT 10/15/19, 8:19 PM  [facility] [address], [place], [address] Phone: [phone]   Fax:  [phone]  Name: Gwendolyn [name] MRN: [id number] Date of Birth: [birth year]

## 2019-10-19 ENCOUNTER — Encounter: Payer: Self-pay | Admitting: Internal Medicine

## 2019-10-19 ENCOUNTER — Other Ambulatory Visit: Payer: Self-pay

## 2019-10-19 ENCOUNTER — Ambulatory Visit (INDEPENDENT_AMBULATORY_CARE_PROVIDER_SITE_OTHER): Payer: BC Managed Care – PPO | Admitting: Internal Medicine

## 2019-10-19 ENCOUNTER — Ambulatory Visit: Payer: BC Managed Care – PPO | Admitting: Nurse Practitioner

## 2019-10-19 ENCOUNTER — Other Ambulatory Visit (HOSPITAL_COMMUNITY)
Admission: RE | Admit: 2019-10-19 | Discharge: 2019-10-19 | Disposition: A | Discharge status: Home / Self Care | Payer: BC Managed Care – PPO | Source: Ambulatory Visit | Attending: Internal Medicine | Admitting: Internal Medicine

## 2019-10-19 VITALS — BP 122/78 | HR 75 | Temp 98.2°F | Resp 15 | Ht 67.0 in | Wt 167.2 lb

## 2019-10-19 DIAGNOSIS — R300 Dysuria: Secondary | ICD-10-CM | POA: Diagnosis not present

## 2019-10-19 DIAGNOSIS — Z124 Encounter for screening for malignant neoplasm of cervix: Secondary | ICD-10-CM | POA: Diagnosis not present

## 2019-10-19 DIAGNOSIS — N760 Acute vaginitis: Secondary | ICD-10-CM | POA: Insufficient documentation

## 2019-10-19 DIAGNOSIS — D0511 Intraductal carcinoma in situ of right breast: Secondary | ICD-10-CM | POA: Diagnosis not present

## 2019-10-19 DIAGNOSIS — Z87891 Personal history of nicotine dependence: Secondary | ICD-10-CM

## 2019-10-19 DIAGNOSIS — E7800 Pure hypercholesterolemia, unspecified: Secondary | ICD-10-CM

## 2019-10-19 DIAGNOSIS — Z0000 Encounter for general adult medical examination without abnormal findings: Secondary | ICD-10-CM

## 2019-10-19 DIAGNOSIS — N390 Urinary tract infection, site not specified: Secondary | ICD-10-CM

## 2019-10-19 DIAGNOSIS — F3181 Bipolar II disorder: Secondary | ICD-10-CM

## 2019-10-19 LAB — POCT URINALYSIS DIPSTICK
Bilirubin, UA: NEGATIVE
Blood, UA: POSITIVE
Glucose, UA: NEGATIVE
Ketones, UA: POSITIVE
Leukocytes, UA: NEGATIVE
Nitrite, UA: NEGATIVE
Protein, UA: NEGATIVE
Spec Grav, UA: >=1.03 — AB (ref 1.010–1.025)
Urobilinogen, UA: 0.2 E.U./dL
pH, UA: 6 (ref 5.0–8.0)

## 2019-10-19 MED ORDER — MELOXICAM 15 MG PO TABS: 15.0000 mg | ORAL_TABLET | Freq: Every day | ORAL | 2 refills | Status: DC

## 2019-10-19 MED ORDER — CLOTRIMAZOLE-BETAMETHASONE 1-0.05 % EX CREA: 1.0000 "application " | TOPICAL_CREAM | Freq: Two times a day (BID) | CUTANEOUS | 0 refills | Status: DC

## 2019-10-19 NOTE — Progress Notes (Signed)
Patient ID: Gwendolyn [name], female    DOB: 06-21-1970  Age: 49 y.o. MRN: [id number]  The patient is here for annual preventive examination and management of other chronic and acute problems.  This visit occurred during the SARS-CoV-2 public health emergency.  Safety protocols were in place, including screening questions prior to the visit, additional usage of staff PPE, and extensive cleaning of exam room while observing appropriate contact time as indicated for disinfecting solutions.    Patient has received both doses of the available COVID 19 vaccine without complications.  Patient continues to mask when outside of the home except when walking in yard or at safe distances from others .  Patient denies any change in mood or development of unhealthy behaviors resuting from the pandemic's restriction of activities and socialization.    The risk factors are reflected in the social history.  The roster of all physicians providing medical care to patient - is listed in the Snapshot section of the chart.  Activities of daily living:  The patient is 100% independent in all ADLs: dressing, toileting, feeding as well as independent mobility  Home safety : The patient has smoke detectors in the home. They wear seatbelts.  There are no firearms at home. There is no violence in the home.   There is no risks for hepatitis, STDs or HIV. There is no   history of blood transfusion. They have no travel history to infectious disease endemic areas of the world.  The patient has seen their dentist in the last six month. They have seen their eye doctor in the last year. They admit to slight hearing difficulty with regard to whispered voices and some television programs.  They have deferred audiologic testing in the last year.  They do not  have excessive sun exposure. Discussed the need for sun protection: hats, long sleeves and use of sunscreen if there is significant sun exposure.   Diet: the importance of  a healthy diet is discussed. They do have a healthy diet.  The benefits of regular aerobic exercise were discussed. She walks 4 times per week ,  20 minutes.   Depression screen: there are no signs or vegative symptoms of depression- irritability, change in appetite, anhedonia, sadness/tearfullness.  Cognitive assessment: the patient manages all their financial and personal affairs and is actively engaged. They could relate day,date,year and events; recalled 2/3 objects at 3 minutes; performed clock-face test normally.  The following portions of the patient's history were reviewed and updated as appropriate: allergies, current medications, past family history, past medical history,  past surgical history, past social history  and problem list.  Visual acuity was not assessed per patient preference since she has regular follow up with her ophthalmologist. Hearing and body mass index were assessed and reviewed.   During the course of the visit the patient was educated and counseled about appropriate screening and preventive services including : fall prevention , diabetes screening, nutrition counseling, colorectal cancer screening, and recommended immunizations.    CC: The primary encounter diagnosis was Cervical cancer screening. Diagnoses of Ductal carcinoma in situ (DCIS) of right breast, Acute vaginitis, Dysuria, Recurrent UTI, Hypercholesteremia, Encounter for preventive health examination, Bipolar 2 disorder ([place]), and History of tobacco abuse were also pertinent to this visit.  1) waves of nausea occurring several times daily. Episodes are brief and self limited,  lasting < 1 minute.  Taking metformin,  Not drinking enough water, no NSAIDS  2) itchy ear ,  Needs refill on clotrimazole -  betamethazone prescribed by ENT 2 years ago.    3) MILD dysuria for the last  Few days,  Not sure if vaginal or urethral source,  No discharge.  Using septra ds postcoitally since urology evaluation.  Sexually  active with a female partner outside of marriage.  STD testing recommended .   4) Left hip pain persistent , has had  2 steroid shots,  PT.  Frustrated by weight gain and inability to exercise .   [name] has a past medical history of Anal fissure, Anxiety, Breast cancer ([place]) (2002), Depression, Family history of brain cancer, Family history of colon cancer, Fibromyalgia, Gallstones, Irritable bowel syndrome, Personal history of radiation therapy, and Yeast vaginitis.   She has a past surgical history that includes Cholecystectomy (2000); Supracervical abdominal hysterectomy (04/10/2011); Colectomy (10/31/2012); Abdominal hysterectomy; Reduction mammaplasty (Bilateral, 11/13/2018); Breast biopsy (Right, 2002); Breast biopsy; and Breast lumpectomy (Right, 2003).   Her family history includes Alcohol abuse in her sister; Brain cancer in her maternal grandfather; Heart disease in her maternal grandfather, maternal grandmother, paternal grandfather, and paternal grandmother; Meniere's disease in her father; Rectal cancer in an other family member.She reports that she quit smoking about 17 years ago. She has a 20.00 pack-year smoking history. She has never used smokeless tobacco. She reports current alcohol use. She reports that she does not use drugs.  Outpatient Medications Prior to Visit  Medication Sig Dispense Refill   amphetamine-dextroamphetamine (ADDERALL) 30 MG tablet Take 30 mg by mouth 2 (two) times daily.   0   DULoxetine HCl 40 MG CPEP Take 2 capsules by mouth every morning.     gabapentin (NEURONTIN) 800 MG tablet TAKE 1 TABLET (800 MG TOTAL) BY MOUTH NIGHTLY. 90 tablet 1   Ginkgo Biloba 40 MG TABS Take by mouth.     Melatonin 3 MG TABS Take 1 tablet by mouth at bedtime.     metFORMIN (GLUCOPHAGE-XR) 500 MG 24 hr tablet TAKE 1 TABLET BY MOUTH EVERY DAY WITH BREAKFAST 90 tablet 1   sertraline (ZOLOFT) 25 MG tablet Take 25 mg by mouth daily.     tiaGABine (GABITRIL) 4 MG  tablet Takes 4mg  in the AM and 12mg  at bedtime     trimethoprim (TRIMPEX) 100 MG tablet Take 1 tablet (100 mg total) by mouth as needed (post-coital prophylaxis to prevent UTIs). 30 tablet 3   varenicline (CHANTIX) 1 MG tablet Take 1 tablet (1 mg total) by mouth 2 (two) times daily. 60 tablet 5   zolpidem (AMBIEN) 10 MG tablet Take 1 tablet (10 mg total) by mouth at bedtime as needed for sleep. 30 tablet 5   hydrocortisone (ANUSOL-HC) 25 MG suppository Place 25 mg rectally 2 (two) times daily as needed for hemorrhoids or anal itching. (Patient not taking: Reported on 10/13/2019)     ciprofloxacin (CIPRO) 500 MG tablet Take 1 tablet (500 mg total) by mouth 2 (two) times daily as needed. (Patient not taking: Reported on 10/06/2019) 10 tablet 3   sulfamethoxazole-trimethoprim (BACTRIM DS) 800-160 MG tablet Take 1 tablet by mouth 2 (two) times daily. (Patient not taking: Reported on 10/19/2019) 14 tablet 0   No facility-administered medications prior to visit.    Review of Systems  Patient denies headache, fevers, malaise, unintentional weight loss, skin rash, eye pain, sinus congestion and sinus pain, sore throat, dysphagia,  hemoptysis , cough, dyspnea, wheezing, chest pain, palpitations, orthopnea, edema, abdominal pain, nausea, melena, diarrhea, constipation, flank pain,, hematuria, urinary  Frequency, nocturia, numbness, tingling, seizures,  Focal weakness, Loss of consciousness,  Tremor, insomnia, depression, anxiety, and suicidal ideation.     Objective:  BP 122/78 (BP Location: Left Arm, Patient Position: Sitting, Cuff Size: Normal)    Pulse 75    Temp 98.2 F (36.8 C) (Temporal)    Resp 15    Ht 5\' 7"  (1.702 m)    Wt 167 lb 3.2 oz (75.8 kg)    SpO2 98%    BMI 26.19 kg/m   Physical Exam  General Appearance:    Alert, cooperative, no distress, appears stated age  Head:    Normocephalic, without obvious abnormality, atraumatic  Eyes:    PERRL, conjunctiva/corneas clear, EOM's intact,  fundi    benign, both eyes  Ears:    Normal TM's and external ear canals, both ears  Nose:   Nares normal, septum midline, mucosa normal, no drainage    or sinus tenderness  Throat:   Lips, mucosa, and tongue normal; teeth and gums normal  Neck:   Supple, symmetrical, trachea midline, no adenopathy;    thyroid:  no enlargement/tenderness/nodules; no carotid   bruit or JVD  Back:     Symmetric, no curvature, ROM normal, no CVA tenderness  Lungs:     Clear to auscultation bilaterally, respirations unlabored  Chest Wall:    No tenderness or deformity   Heart:    Regular rate and rhythm, S1 and S2 normal, no murmur, rub   or gallop  Breast Exam:    Deferred per patient preference  Abdomen:     Soft, non-tender, bowel sounds active all four quadrants,    no masses, no organomegaly  Genitalia:    Pelvic: cervix normal in appearance, external genitalia normal, no adnexal masses or tenderness, no cervical motion tenderness, rectovaginal septum normal, uterus normal size, shape, and consistency and vagina normal without purulent or malodorous discharge  Extremities:   Extremities normal, atraumatic, no cyanosis or edema  Pulses:   2+ and symmetric all extremities  Skin:   Skin color, texture, turgor normal, no rashes or lesions  Lymph nodes:   Cervical, supraclavicular, and axillary nodes normal  Neurologic:   CNII-XII intact, normal strength, sensation and reflexes    throughout      Assessment & Plan:   Problem List Items Addressed This Visit      Unprioritized   Vaginitis    Exam is normal,  Leukorrhea noted.  STD screening advised and in process.      Relevant Orders   Cytology - PAP( [place])   Recurrent UTI    Managed with postcoital antibiotic prophylaxis prescribed by Urology       Relevant Medications   clotrimazole-betamethasone (LOTRISONE) cream   Hypercholesteremia    Based on 2019 lipids, her risk of CAD is < 5%.  No statin indication   Lab Results  Component  Value Date   CHOL 210 (H) 10/02/2017   HDL 45 10/02/2017   LDLCALC 127 (H) 10/02/2017   TRIG 191 (H) 10/02/2017   CHOLHDL 4.7 (H) 10/02/2017         History of tobacco abuse    She has been successful quitting with the use of Chantix/       Encounter for preventive health examination    age appropriate education and counseling updated, referrals for preventative services and immunizations addressed, dietary and smoking counseling addressed, most recent labs reviewed.  I have personally reviewed and have noted:  1) the patient's medical and social history 2) The pt's  use of alcohol, tobacco, and illicit drugs 3) The patient's current medications and supplements 4) Functional ability including ADL's, fall risk, home safety risk, hearing and visual impairment 5) Diet and physical activities 6) Evidence for depression or mood disorder 7) The patient's height, weight, and BMI have been recorded in the chart  I have made referrals, and provided counseling and education based on review of the above      DCIS (ductal carcinoma in situ)    Incidental finding during breast reduction in July 2020,  Missed by MRI 9 days earlier.  May have  Been totally resected.  History of breast cancer s/p lumpectomy and XRT in 2002 right brast.  MRI breast tomorrow       Bipolar 2 disorder ([place])    Other Visit Diagnoses    Cervical cancer screening    -  Primary   Relevant Orders   Cytology - PAP( [place])   Dysuria       Relevant Orders   Urine Microscopic Only (Completed)   Urine Culture   POCT Urinalysis Dipstick (Completed)      I have discontinued William Defibaugh's ciprofloxacin and sulfamethoxazole-trimethoprim. I am also having her start on clotrimazole-betamethasone and meloxicam. Additionally, I am having her maintain her tiaGABine, melatonin, Ginkgo Biloba, amphetamine-dextroamphetamine, hydrocortisone, zolpidem, DULoxetine HCl, trimethoprim, varenicline, gabapentin,  metFORMIN, and sertraline.  Meds ordered this encounter  Medications   clotrimazole-betamethasone (LOTRISONE) cream    Sig: Apply 1 application topically 2 (two) times daily.    Dispense:  30 g    Refill:  0   meloxicam (MOBIC) 15 MG tablet    Sig: Take 1 tablet (15 mg total) by mouth daily.    Dispense:  30 tablet    Refill:  2    Medications Discontinued During This Encounter  Medication Reason   sulfamethoxazole-trimethoprim (BACTRIM DS) 800-160 MG tablet Completed Course   ciprofloxacin (CIPRO) 500 MG tablet     Follow-up: No follow-ups on file.   [name], MD

## 2019-10-19 NOTE — Patient Instructions (Signed)
MELOXICAM 15 MG DAILY AS YOUR NSAID   You can add up to 2000 mg of acetominophen (tylenol) every day safely  In divided doses (500 mg every 6 hours  Or 1000 mg every 12 hours.)  INCREASE YOUR WATER INTAKE AND IF THE NAUSEA CONTINUES, SUSPEND THE METFORMIN FOR A WEEK    Health Maintenance for Postmenopausal Women Menopause is a normal process in which your ability to get pregnant comes to an end. This process happens slowly over many months or years, usually between the ages of 85 and 2. Menopause is complete when you have missed your menstrual periods for 12 months. It is important to talk with your health care provider about some of the most common conditions that affect women after menopause (postmenopausal women). These include heart disease, cancer, and bone loss (osteoporosis). Adopting a healthy lifestyle and getting preventive care can help to promote your health and wellness. The actions you take can also lower your chances of developing some of these common conditions. What should I know about menopause? During menopause, you may get a number of symptoms, such as:  Hot flashes. These can be moderate or severe.  Night sweats.  Decrease in sex drive.  Mood swings.  Headaches.  Tiredness.  Irritability.  Memory problems.  Insomnia. Choosing to treat or not to treat these symptoms is a decision that you make with your health care provider. Do I need hormone replacement therapy?  Hormone replacement therapy is effective in treating symptoms that are caused by menopause, such as hot flashes and night sweats.  Hormone replacement carries certain risks, especially as you become older. If you are thinking about using estrogen or estrogen with progestin, discuss the benefits and risks with your health care provider. What is my risk for heart disease and stroke? The risk of heart disease, heart attack, and stroke increases as you age. One of the causes may be a change in the  body's hormones during menopause. This can affect how your body uses dietary fats, triglycerides, and cholesterol. Heart attack and stroke are medical emergencies. There are many things that you can do to help prevent heart disease and stroke. Watch your blood pressure  High blood pressure causes heart disease and increases the risk of stroke. This is more likely to develop in people who have high blood pressure readings, are of African descent, or are overweight.  Have your blood pressure checked: ? Every 3-5 years if you are 64-66 years of age. ? Every year if you are 43 years old or older. Eat a healthy diet   Eat a diet that includes plenty of vegetables, fruits, low-fat dairy products, and lean protein.  Do not eat a lot of foods that are high in solid fats, added sugars, or sodium. Get regular exercise Get regular exercise. This is one of the most important things you can do for your health. Most adults should:  Try to exercise for at least 150 minutes each week. The exercise should increase your heart rate and make you sweat (moderate-intensity exercise).  Try to do strengthening exercises at least twice each week. Do these in addition to the moderate-intensity exercise.  Spend less time sitting. Even light physical activity can be beneficial. Other tips  Work with your health care provider to achieve or maintain a healthy weight.  Do not use any products that contain nicotine or tobacco, such as cigarettes, e-cigarettes, and chewing tobacco. If you need help quitting, ask your health care provider.  Know  your numbers. Ask your health care provider to check your cholesterol and your blood sugar (glucose). Continue to have your blood tested as directed by your health care provider. Do I need screening for cancer? Depending on your health history and family history, you may need to have cancer screening at different stages of your life. This may include screening for:  Breast  cancer.  Cervical cancer.  Lung cancer.  Colorectal cancer. What is my risk for osteoporosis? After menopause, you may be at increased risk for osteoporosis. Osteoporosis is a condition in which bone destruction happens more quickly than new bone creation. To help prevent osteoporosis or the bone fractures that can happen because of osteoporosis, you may take the following actions:  If you are 30-82 years old, get at least 1,000 mg of calcium and at least 600 mg of vitamin D per day.  If you are older than age 76 but younger than age 34, get at least 1,200 mg of calcium and at least 600 mg of vitamin D per day.  If you are older than age 54, get at least 1,200 mg of calcium and at least 800 mg of vitamin D per day. Smoking and drinking excessive alcohol increase the risk of osteoporosis. Eat foods that are rich in calcium and vitamin D, and do weight-bearing exercises several times each week as directed by your health care provider. How does menopause affect my mental health? Depression may occur at any age, but it is more common as you become older. Common symptoms of depression include:  Low or sad mood.  Changes in sleep patterns.  Changes in appetite or eating patterns.  Feeling an overall lack of motivation or enjoyment of activities that you previously enjoyed.  Frequent crying spells. Talk with your health care provider if you think that you are experiencing depression. General instructions See your health care provider for regular wellness exams and vaccines. This may include:  Scheduling regular health, dental, and eye exams.  Getting and maintaining your vaccines. These include: ? Influenza vaccine. Get this vaccine each year before the flu season begins. ? Pneumonia vaccine. ? Shingles vaccine. ? Tetanus, diphtheria, and pertussis (Tdap) booster vaccine. Your health care provider may also recommend other immunizations. Tell your health care provider if you have ever  been abused or do not feel safe at home. Summary  Menopause is a normal process in which your ability to get pregnant comes to an end.  This condition causes hot flashes, night sweats, decreased interest in sex, mood swings, headaches, or lack of sleep.  Treatment for this condition may include hormone replacement therapy.  Take actions to keep yourself healthy, including exercising regularly, eating a healthy diet, watching your weight, and checking your blood pressure and blood sugar levels.  Get screened for cancer and depression. Make sure that you are up to date with all your vaccines. This information is not intended to replace advice given to you by your health care provider. Make sure you discuss any questions you have with your health care provider. Document Revised: 04/02/2018 Document Reviewed: 04/02/2018 Elsevier Patient Education  2020 [employer].

## 2019-10-19 NOTE — Assessment & Plan Note (Signed)
Incidental finding during breast reduction in July 2020,  Missed by MRI 9 days earlier.  May have  Been totally resected.  History of breast cancer s/p lumpectomy and XRT in 2002 right brast.  MRI breast tomorrow

## 2019-10-20 ENCOUNTER — Encounter: Payer: Self-pay | Admitting: Internal Medicine

## 2019-10-20 ENCOUNTER — Ambulatory Visit: Payer: BC Managed Care – PPO

## 2019-10-20 DIAGNOSIS — M6281 Muscle weakness (generalized): Secondary | ICD-10-CM

## 2019-10-20 DIAGNOSIS — M25552 Pain in left hip: Secondary | ICD-10-CM | POA: Diagnosis not present

## 2019-10-20 LAB — URINALYSIS, MICROSCOPIC ONLY

## 2019-10-20 LAB — URINE CULTURE
MICRO NUMBER:: 10641934
Result:: NO GROWTH
SPECIMEN QUALITY:: ADEQUATE

## 2019-10-20 NOTE — Assessment & Plan Note (Signed)
She has been successful quitting with the use of Chantix/

## 2019-10-20 NOTE — Assessment & Plan Note (Signed)
Based on 2019 lipids, her risk of CAD is < 5%.  No statin indication   Lab Results  Component Value Date   CHOL 210 (H) 10/02/2017   HDL 45 10/02/2017   LDLCALC 127 (H) 10/02/2017   TRIG 191 (H) 10/02/2017   CHOLHDL 4.7 (H) 10/02/2017

## 2019-10-20 NOTE — Assessment & Plan Note (Signed)
Managed with postcoital antibiotic prophylaxis prescribed by Urology

## 2019-10-20 NOTE — Patient Instructions (Addendum)
Hip circles on furniture slider, yellow band around thighs  10x 3 each side  Keep your stance knee forward and slightly bent   Keep the foot on your slider facing forward (keep your eyes on it)     Access Code: [email or web address] URL: [email or web address] Date: 10/20/2019 Prepared by: Joneen Boers  Exercises Sidelying Hip Abduction - 1 x daily - 7 x weekly - 3 sets - 10 reps

## 2019-10-20 NOTE — Assessment & Plan Note (Addendum)
Exam is normal,  Leukorrhea noted.  STD screening advised and in process.

## 2019-10-20 NOTE — Assessment & Plan Note (Signed)

## 2019-10-20 NOTE — Therapy (Signed)
[facility] [address] [place], [address] Phone: [phone]   Fax:  [phone]  Physical Therapy Treatment  Patient Details  Name: Gwendolyn [name] MRN: [id number]  Date of Birth: [birth year] Referring Provider (PT): Gregor Hams, MD   Encounter Date: 10/20/2019   PT End of Session - 10/20/19 0805    Visit Number 3    Number of Visits 17    Date for PT Re-Evaluation 12/10/19    PT Start Time 0806    PT Stop Time 0848    PT Time Calculation (min) 42 min    Activity Tolerance Patient tolerated treatment well    Behavior During Therapy [facility] for tasks assessed/performed           Past Medical History:  Diagnosis Date  . Anal fissure   . Anxiety   . Breast cancer ([place]) 2002   lumpectomy-Right  . Depression   . Family history of brain cancer   . Family history of colon cancer   . Fibromyalgia   . Gallstones   . Irritable bowel syndrome   . Personal history of radiation therapy   . Yeast vaginitis     Past Surgical History:  Procedure Laterality Date  . ABDOMINAL HYSTERECTOMY    . 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 BREAST BIOPSY    . BREAST LUMPECTOMY Right 2003   Radiatiomn Therapy DCIS  . CHOLECYSTECTOMY  2000  . COLECTOMY  10/31/2012   SECONDARY TO COLONIC INERTIA  . REDUCTION MAMMAPLASTY Bilateral 11/13/2018   right breast 74mm DCIS found during breast reduction  . SUPRACERVICAL ABDOMINAL HYSTERECTOMY  04/10/2011   But still has ovaries.    There were no vitals filed for this visit.   Subjective Assessment - 10/20/19 0807    Subjective Pt states that she has been doing her exercises. She states that she has been using a pillow between the knees which helps. No pain currently.    Pertinent History L hip pain. Pain began middle of May 2021, gradual onset. Saw MD who gave her a cortisone shot and exercises. Went to a belly dancing class and felt a pop. L leg  buckled afterwards. Returned to her MD, got a cortisone shot and an ultrasound which revealed L hip bursitis in addition to her L hip tendinosis. Pt also has a small fracture in her R pinky toe which began [employer]. Accidentally kicked her toiletry bag. R pinky toe fracture improving. L hip pain improving overall because pt states that she is not doing anything. Likes to be active and not doing anything makes her miserable.  Prior to injury, pt was taking dance clases: belly dancing, latin fusion, [place] African, hula class. Pt also works out with a Physiological scientist, 3 days a week, 30 minute session (a lot of arms and abs). Her MD gave her hip abductor stretch, TFL stretch which really bothers her.    Patient Stated Goals Get back into the gym, return to dancing without limitations.    Currently in Pain? No/denies    Pain Score 0-No pain    Pain Onset More than a month ago                                     PT Education - 10/20/19 5009    Education Details ther-ex, HEP  Person(s) Educated Patient    Methods Explanation;Demonstration;Tactile cues;Verbal cues    Comprehension Returned demonstration;Verbalized understanding            No latex band allergies  No blood pressure problems per pt.   TTP L glute med tendon, TFL muscle, piriformis muscle tendon with reproduction of symptoms.  No pain with L hip abduction AROM observed.   Therapeutic exercise:to centralize symptoms and improve ROM, strength, muscular endurance, and activity tolerance required for successful completion of functional activities.   Side stepping 32 ft to the R and 32 ft to the L.   Then with yellow band 1x each direction  Hip abduction on furniture slider with yellow band resistance, B UE assist, cues for neutral femur  R 10x2  L 10x2  Hip circles on furniture slider, yellow band resist, counter clockwise and clockwise directions, B UE assist  R 10x2  L 10x2  S/L  hip abduction   L 10x3   Prone glute max extension   L 10x3 with 5 seconds  R 10x3 with 5 seconds   Discussed precautions with hip movements and control with the dancing to promote progress.    Improved exercise technique, movement at target joints, use of target muscles after mod verbal, visual, tactile cues.    Response to treatment Pt tolerated session well without aggravation of symptoms     Clinical impression No pain throughout session. Added concentric loading to promote proper stress to affected tissues to promote healing. Good muscle use felt with exercises. Moderate cues needed for proper form with exercise. Pt will benefit from continued skilled physical therapy services to decrease pain, improve strength, function, and return to dancing.        PT Short Term Goals - 10/13/19 1826      PT SHORT TERM GOAL #1   Title Patient will be independent with her HEP to decrease pain, improve ability to return to her normal workout routine.    Baseline Pt has started her HEP (10/13/2019)    Time 3    Period Weeks    Status New    Target Date 11/05/19             PT Long Term Goals - 10/13/19 1827      PT LONG TERM GOAL #1   Title Patient will have a decrease in L hip pain to 2/10 or less at worst to promote ability to tolerate activities which involve hip adduction, as well as to promote ability to return to her normal exercise routine.    Baseline 4/10 L hip pain at most for the past 2 weeks (10/13/2019)    Time 8    Period Weeks    Status New    Target Date 12/10/19      PT LONG TERM GOAL #2   Title Patient will improve L hip abduction and extension strength by at least 1/2 MMT grade to promote ability to perform standing tasks more comfortably.    Baseline L hip abduction 4-/5 with pain, hip extension 4-/5 (10/13/2019)    Time 8    Period Weeks    Status New    Target Date 12/10/19      PT LONG TERM GOAL #3   Title Patient will improve her hip FOTO score by  at least 10 points as a demonstration of improved function.    Baseline Hip FOTO 43 (10/13/2019)    Time 8    Period Weeks    Status New  Target Date 12/10/19                 Plan - 10/20/19 0835    Clinical Impression Statement No pain throughout session. Added concentric loading to promote proper stress to affected tissues to promote healing. Good muscle use felt with exercises. Moderate cues needed for proper form with exercise. Pt will benefit from continued skilled physical therapy services to decrease pain, improve strength, function, and return to dancing.    Personal Factors and Comorbidities Comorbidity 3+    Comorbidities Hx of breast CA, fibromyalgia, hx of radiation therapy    Examination-Activity Limitations Sleep;Locomotion Level;Stairs    Stability/Clinical Decision Making Stable/Uncomplicated    Rehab Potential Fair    PT Frequency 2x / week    PT Duration 8 weeks    PT Treatment/Interventions Aquatic Therapy;Electrical Stimulation;Iontophoresis 4mg /ml Dexamethasone;Therapeutic activities;Therapeutic exercise;Neuromuscular re-education;Patient/family education;Manual techniques;Dry needling    PT Next Visit Plan isometric, concentric, eccentric glute med, piriformis musle strengthening, lumbopelvic and femoral control, manual techniques, modalities PRN    Consulted and Agree with Plan of Care Patient           Patient will benefit from skilled therapeutic intervention in order to improve the following deficits and impairments:     Visit Diagnosis: Pain in left hip  Muscle weakness (generalized)     Problem List Patient Active Problem List   Diagnosis Date Noted  . 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 Tobacco abuse 03/13/2019  . Encounter for tobacco use cessation counseling 03/13/2019  . Genetic testing 01/19/2019  . Family history of brain cancer   . Family history of colon cancer   . UTI (urinary tract infection) 12/08/2018  . 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 Hematuria 12/05/2017  . Rectal bleeding 09/02/2017  . Encounter for preventive health examination 11/22/2016  . History of abdominal supracervical subtotal hysterectomy 11/22/2016  . Achilles tendinitis of both lower extremities 11/10/2015  . Plantar fasciitis 11/10/2015  . Obesity (BMI 30.0-34.9) 07/23/2015  . Vaginitis 03/18/2015  . Menopause 01/18/2015  . Hypercholesteremia 10/05/2014  . Vitamin D deficiency 10/05/2014  . Depressive disorder 10/05/2014  . History of breast cancer 10/05/2014  . Migraine without aura with status migrainosus 10/05/2014  . 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 Insomnia 10/05/2014  . Bipolar 2 disorder ([place]) 10/05/2014  . IBS (irritable bowel syndrome) 10/05/2014  . Obstructive sleep apnea 10/05/2014  . S/P colectomy 10/05/2014  . Hypersomnia 10/05/2014  . Anxiety 10/05/2014  . Fibromyalgia 10/05/2014  . Periodic limb movement disorder 10/05/2014    Joneen Boers PT, DPT   10/20/2019, 9:20 AM  [facility] [address] [place], [address] Phone: [phone]   Fax:  [phone]  Name: Gwendolyn [name] MRN: [id number] Date of Birth: 02/22/1971

## 2019-10-21 ENCOUNTER — Other Ambulatory Visit: Payer: Self-pay

## 2019-10-21 ENCOUNTER — Other Ambulatory Visit (INDEPENDENT_AMBULATORY_CARE_PROVIDER_SITE_OTHER): Payer: BC Managed Care – PPO

## 2019-10-21 ENCOUNTER — Ambulatory Visit: Payer: BC Managed Care – PPO | Admitting: Dermatology

## 2019-10-21 DIAGNOSIS — E7800 Pure hypercholesterolemia, unspecified: Secondary | ICD-10-CM

## 2019-10-21 DIAGNOSIS — Z0000 Encounter for general adult medical examination without abnormal findings: Secondary | ICD-10-CM

## 2019-10-22 ENCOUNTER — Encounter: Payer: Self-pay | Admitting: Physical Therapy

## 2019-10-22 ENCOUNTER — Ambulatory Visit: Payer: BC Managed Care – PPO | Attending: Family Medicine | Admitting: Physical Therapy

## 2019-10-22 DIAGNOSIS — M6281 Muscle weakness (generalized): Secondary | ICD-10-CM | POA: Insufficient documentation

## 2019-10-22 DIAGNOSIS — M25552 Pain in left hip: Secondary | ICD-10-CM | POA: Diagnosis not present

## 2019-10-22 LAB — CBC WITH DIFFERENTIAL/PLATELET
Absolute Monocytes: 577 cells/uL (ref 200–950)
Basophils Absolute: 29 cells/uL (ref 0–200)
Basophils Relative: 0.4 %
Eosinophils Absolute: 183 cells/uL (ref 15–500)
Eosinophils Relative: 2.5 %
HCT: 41 % (ref 35.0–45.0)
Hemoglobin: 13.5 g/dL (ref 11.7–15.5)
Lymphs Abs: 1840 cells/uL (ref 850–3900)
MCH: 31.3 pg (ref 27.0–33.0)
MCHC: 32.9 g/dL (ref 32.0–36.0)
MCV: 94.9 fL (ref 80.0–100.0)
MPV: 10 fL (ref 7.5–12.5)
Monocytes Relative: 7.9 %
Neutro Abs: 4672 cells/uL (ref 1500–7800)
Neutrophils Relative %: 64 %
Platelets: 250 10*3/uL (ref 140–400)
RBC: 4.32 10*6/uL (ref 3.80–5.10)
RDW: 12.5 % (ref 11.0–15.0)
Total Lymphocyte: 25.2 %
WBC: 7.3 10*3/uL (ref 3.8–10.8)

## 2019-10-22 LAB — COMPREHENSIVE METABOLIC PANEL
AG Ratio: 1.8 (calc) (ref 1.0–2.5)
ALT: 11 U/L (ref 6–29)
AST: 14 U/L (ref 10–35)
Albumin: 3.9 g/dL (ref 3.6–5.1)
Alkaline phosphatase (APISO): 27 U/L — ABNORMAL LOW (ref 37–153)
BUN: 11 mg/dL (ref 7–25)
CO2: 29 mmol/L (ref 20–32)
Calcium: 9 mg/dL (ref 8.6–10.4)
Chloride: 104 mmol/L (ref 98–110)
Creat: 0.94 mg/dL (ref 0.50–1.05)
Globulin: 2.2 g/dL (calc) (ref 1.9–3.7)
Glucose, Bld: 94 mg/dL (ref 65–99)
Potassium: 4.1 mmol/L (ref 3.5–5.3)
Sodium: 140 mmol/L (ref 135–146)
Total Bilirubin: 0.6 mg/dL (ref 0.2–1.2)
Total Protein: 6.1 g/dL (ref 6.1–8.1)

## 2019-10-22 LAB — LIPID PANEL
Cholesterol: 222 mg/dL — ABNORMAL HIGH (ref <200)
HDL: 66 mg/dL (ref >=50)
LDL Cholesterol (Calc): 130 mg/dL (calc) — ABNORMAL HIGH
Non-HDL Cholesterol (Calc): 156 mg/dL (calc) — ABNORMAL HIGH (ref <130)
Total CHOL/HDL Ratio: 3.4 (calc) (ref <5.0)
Triglycerides: 145 mg/dL (ref <150)

## 2019-10-22 LAB — HEMOGLOBIN A1C
Hgb A1c MFr Bld: 5.1 % of total Hgb (ref <5.7)
Mean Plasma Glucose: 100 (calc)
eAG (mmol/L): 5.5 (calc)

## 2019-10-22 LAB — TSH: TSH: 1.54 mIU/L

## 2019-10-22 NOTE — Therapy (Signed)
[facility] [address], [place], [address] Phone: [phone]   Fax:  [phone]  Physical Therapy Treatment  Patient Details  Name: Gwendolyn [name] MRN: [id number] Date of Birth: 28-Dec-1970 Referring Provider (PT): Gregor Hams, MD   Encounter Date: 10/22/2019   PT End of Session - 10/22/19 1139    Visit Number 4    Number of Visits 17    Date for PT Re-Evaluation 12/10/19    PT Start Time 1030    PT Stop Time 1110    PT Time Calculation (min) 40 min    Activity Tolerance Patient tolerated treatment well    Behavior During Therapy [facility] for tasks assessed/performed           Past Medical History:  Diagnosis Date  . Anal fissure   . Anxiety   . Breast cancer ([place]) 2002   lumpectomy-Right  . Depression   . Family history of brain cancer   . Family history of colon cancer   . Fibromyalgia   . Gallstones   . Irritable bowel syndrome   . Personal history of radiation therapy   . Yeast vaginitis     Past Surgical History:  Procedure Laterality Date  . ABDOMINAL HYSTERECTOMY    . 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 BREAST BIOPSY    . BREAST LUMPECTOMY Right 2003   Radiatiomn Therapy DCIS  . CHOLECYSTECTOMY  2000  . COLECTOMY  10/31/2012   SECONDARY TO COLONIC INERTIA  . REDUCTION MAMMAPLASTY Bilateral 11/13/2018   right breast 59mm DCIS found during breast reduction  . SUPRACERVICAL ABDOMINAL HYSTERECTOMY  04/10/2011   But still has ovaries.    There were no vitals filed for this visit.   Subjective Assessment - 10/22/19 1137    Subjective Patient reports she was very sore in her glutes and legs but not at her site of pain. She did not do any exercise yesterday or this morning due to the increased soreness. She feels this is just DOMS from doing exercises she is not used to at last session and is not the same as her hip pain. She is not bothered by it and it is getting  better.    Pertinent History L hip pain. Pain began middle of May 2021, gradual onset. Saw MD who gave her a cortisone shot and exercises. Went to a belly dancing class and felt a pop. L leg buckled afterwards. Returned to her MD, got a cortisone shot and an ultrasound which revealed L hip bursitis in addition to her L hip tendinosis. Pt also has a small fracture in her R pinky toe which began [employer]. Accidentally kicked her toiletry bag. R pinky toe fracture improving. L hip pain improving overall because pt states that she is not doing anything. Likes to be active and not doing anything makes her miserable.  Prior to injury, pt was taking dance clases: belly dancing, latin fusion, [place] African, hula class. Pt also works out with a Physiological scientist, 3 days a week, 30 minute session (a lot of arms and abs). Her MD gave her hip abductor stretch, TFL stretch which really bothers her.    Patient Stated Goals Get back into the gym, return to dancing without limitations.    Currently in Pain? No/denies    Pain Onset More than a month ago           No latex band allergies  No blood pressure problems per pt.   TTP L glute med tendon, TFL muscle, piriformis muscle tendon with reproduction of symptoms.  No pain with L hip abduction AROM observed.  Therapeutic exercise:to centralize symptoms and improve ROM, strength, muscular endurance, and activity tolerance required for successful completion of functional activities.  Side stepping 32 ft to the R and 32 ft to the L.              Then with yellow band around distal thighs 1x each direction  Hip abduction on furniture slider with yellow band resistance around ankles, B UE assist, cues for neutral femur             R 10x2             L 10x2  Hip circles on furniture slider, yellow band resist at ankles, counter clockwise and clockwise directions, B UE assist             R 10x2             L 10x2  S/L hip abduction                L 10x3  Prone glute max extension              L 10x3 with 5 seconds             R 10x3 with 5 seconds  Seated itrinsic foot exercises to improve ground up control - Toe Yoga: great toe extension with small toes flexion pressure into floor, repeated 2 sec holds; small toes extension with great toe flexion pressure into floor, repeated 3 sec holds. Ball of foot and heel maintains contact with floor. To improve intrinsic foot muscle activation and strength in order to better support arch and intrinsic foot structures. X 10 both feet.  - Toe splays, repeated 2 sec holds. Ball of foot and heel maintains contact with floor. To improve intrinsic foot muscle activation and strength in order to better support arch and intrinsic foot structures. X 10 both feet - short foot/arch raise: x 10 both sides.   Pt required multimodal cuing for proper technique and to facilitate improved neuromuscular control, strength, range of motion, and functional ability resulting in improved performance and form.  HOME EXERCISE PROGRAM  Access Code: [email or web address] URL: [email or web address] Date: 10/22/2019 Prepared by: Rosita Kea  Exercises (as desired) Seated Great Toe Extension - 20 reps - 2 seconds hold Seated Lesser Toes Extension - 20 reps - 2 seconds hold Toe Spreading - 20 reps - 2 seconds hold Arch Lifting - 20 reps - 2 seconds hold   Response to treatment Pt tolerated session well without aggravation of symptoms  Clinical impression Patient tolerated treatment well overall and had no overall increase in pain by end of session. Demonstrates good body awareness and ability to respond to cuing, but does need cuing to improve form, especially to maintain neutral femur. Did not advance hip exercises due to DOMS following last treatment session. Patient would benefit from continued management of limiting condition by skilled physical therapist to address remaining impairments and  functional limitations to work towards stated goals and return to PLOF or maximal functional independence.      PT Education - 10/22/19 1139    Education Details Exercise purpose/form. Self management techniques.    Person(s) Educated Patient    Methods Explanation;Demonstration;Tactile cues;Verbal cues;Handout    Comprehension Verbalized understanding;Returned demonstration;Verbal cues required;Tactile cues required;Need further instruction  PT Short Term Goals - 10/22/19 1146      PT SHORT TERM GOAL #1   Title Patient will be independent with her HEP to decrease pain, improve ability to return to her normal workout routine.    Baseline Pt has started her HEP (10/13/2019)    Time 3    Period Weeks    Status Achieved    Target Date 11/05/19             PT Long Term Goals - 10/13/19 1827      PT LONG TERM GOAL #1   Title Patient will have a decrease in L hip pain to 2/10 or less at worst to promote ability to tolerate activities which involve hip adduction, as well as to promote ability to return to her normal exercise routine.    Baseline 4/10 L hip pain at most for the past 2 weeks (10/13/2019)    Time 8    Period Weeks    Status New    Target Date 12/10/19      PT LONG TERM GOAL #2   Title Patient will improve L hip abduction and extension strength by at least 1/2 MMT grade to promote ability to perform standing tasks more comfortably.    Baseline L hip abduction 4-/5 with pain, hip extension 4-/5 (10/13/2019)    Time 8    Period Weeks    Status New    Target Date 12/10/19      PT LONG TERM GOAL #3   Title Patient will improve her hip FOTO score by at least 10 points as a demonstration of improved function.    Baseline Hip FOTO 43 (10/13/2019)    Time 8    Period Weeks    Status New    Target Date 12/10/19                 Plan - 10/22/19 1143    Clinical Impression Statement Patient tolerated treatment well overall and had no overall increase  in pain by end of session. Demonstrates good body awareness and ability to respond to cuing, but does need cuing to improve form, especially to maintain neutral femur. Did not advance hip exercises due to DOMS following last treatment session. Patient would benefit from continued management of limiting condition by skilled physical therapist to address remaining impairments and functional limitations to work towards stated goals and return to PLOF or maximal functional independence.    Personal Factors and Comorbidities Comorbidity 3+    Comorbidities Hx of breast CA, fibromyalgia, hx of radiation therapy    Examination-Activity Limitations Sleep;Locomotion Level;Stairs    Stability/Clinical Decision Making Stable/Uncomplicated    Rehab Potential Fair    PT Frequency 2x / week    PT Duration 8 weeks    PT Treatment/Interventions Aquatic Therapy;Electrical Stimulation;Iontophoresis 4mg /ml Dexamethasone;Therapeutic activities;Therapeutic exercise;Neuromuscular re-education;Patient/family education;Manual techniques;Dry needling    PT Next Visit Plan isometric, concentric, eccentric glute med, piriformis musle strengthening, lumbopelvic and femoral control, manual techniques, modalities PRN    Consulted and Agree with Plan of Care Patient           Patient will benefit from skilled therapeutic intervention in order to improve the following deficits and impairments:     Visit Diagnosis: Pain in left hip  Muscle weakness (generalized)     Problem List Patient Active Problem List   Diagnosis Date Noted  . 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 History of tobacco abuse 03/13/2019  . Encounter for tobacco  use cessation counseling 03/13/2019  . Genetic testing 01/19/2019  . Family history of brain cancer   . Family history of colon cancer   . Recurrent UTI 12/08/2018  . 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 Hematuria 12/05/2017  .  Rectal bleeding 09/02/2017  . Encounter for preventive health examination 11/22/2016  . History of abdominal supracervical subtotal hysterectomy 11/22/2016  . Plantar fasciitis 11/10/2015  . Vaginitis 03/18/2015  . Menopause 01/18/2015  . Hypercholesteremia 10/05/2014  . Vitamin D deficiency 10/05/2014  . Depressive disorder 10/05/2014  . History of breast cancer 10/05/2014  . Migraine without aura with status migrainosus 10/05/2014  . 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 Insomnia 10/05/2014  . Bipolar 2 disorder ([place]) 10/05/2014  . IBS (irritable bowel syndrome) 10/05/2014  . Obstructive sleep apnea 10/05/2014  . S/P colectomy 10/05/2014  . Hypersomnia 10/05/2014  . Anxiety 10/05/2014  . Fibromyalgia 10/05/2014  . Periodic limb movement disorder 10/05/2014    Everlean Alstrom. Graylon Good, PT, DPT 10/22/19, 11:46 AM  [facility] [address] [place], [address] Phone: [phone]   Fax:  [phone]  Name: Gwendolyn [name] MRN: [id number] Date of Birth: 05/25/1970

## 2019-10-23 LAB — CYTOLOGY - PAP
Chlamydia: NEGATIVE
Comment: NEGATIVE
Comment: NEGATIVE
Comment: NEGATIVE
Comment: NEGATIVE
Comment: NORMAL
Diagnosis: NEGATIVE
HSV1: NEGATIVE
HSV2: NEGATIVE
High risk HPV: NEGATIVE
Neisseria Gonorrhea: NEGATIVE
Trichomonas: NEGATIVE

## 2019-10-23 NOTE — Progress Notes (Signed)
I am pleased to inform you that your PAP smear was normal,  And your  HPV screen was negative.  All screening tests were negative for infection  We will repeat in  1 year   Dr Derrel Nip

## 2019-10-23 NOTE — Progress Notes (Signed)
Your CBC, cholesterol  thyroid , liver and kidney function are normal.  You are not  prediabetic either!.  In fact you have lowered your a1c from 5.7 to 5.1   Regards,  Dr. Derrel Nip

## 2019-10-27 ENCOUNTER — Ambulatory Visit: Payer: BC Managed Care – PPO | Admitting: Physical Therapy

## 2019-10-29 ENCOUNTER — Ambulatory Visit: Payer: BC Managed Care – PPO | Admitting: Physical Therapy

## 2019-10-29 ENCOUNTER — Encounter: Payer: BC Managed Care – PPO | Admitting: Physical Therapy

## 2019-11-03 ENCOUNTER — Encounter: Payer: Self-pay | Admitting: Family Medicine

## 2019-11-03 ENCOUNTER — Ambulatory Visit: Payer: BC Managed Care – PPO | Admitting: Physical Therapy

## 2019-11-03 ENCOUNTER — Ambulatory Visit: Payer: BC Managed Care – PPO | Admitting: Family Medicine

## 2019-11-03 ENCOUNTER — Other Ambulatory Visit: Payer: Self-pay

## 2019-11-03 ENCOUNTER — Encounter: Payer: Self-pay | Admitting: Physical Therapy

## 2019-11-03 VITALS — BP 100/64 | HR 91 | Ht 67.0 in | Wt 167.0 lb

## 2019-11-03 DIAGNOSIS — M25552 Pain in left hip: Secondary | ICD-10-CM

## 2019-11-03 DIAGNOSIS — M76892 Other specified enthesopathies of left lower limb, excluding foot: Secondary | ICD-10-CM

## 2019-11-03 DIAGNOSIS — M6281 Muscle weakness (generalized): Secondary | ICD-10-CM

## 2019-11-03 NOTE — Therapy (Signed)
[facility] [address], [place], [address] Phone: [phone]   Fax:  [phone]  Physical Therapy Treatment  Patient Details  Name: Gwendolyn [name] MRN: [id number] Date of Birth: 12-19-1970 Referring Provider (PT): Gregor Hams, MD   Encounter Date: 11/03/2019   PT End of Session - 11/03/19 1356    Visit Number 5    Number of Visits 17    Date for PT Re-Evaluation 12/10/19    PT Start Time 1300    PT Stop Time 1348    PT Time Calculation (min) 48 min    Activity Tolerance Patient tolerated treatment well    Behavior During Therapy [name] for tasks assessed/performed           Past Medical History:  Diagnosis Date  . Anal fissure   . Anxiety   . Breast cancer ([place]) 2002   lumpectomy-Right  . Depression   . Family history of brain cancer   . Family history of colon cancer   . Fibromyalgia   . Gallstones   . Irritable bowel syndrome   . Personal history of radiation therapy   . Yeast vaginitis     Past Surgical History:  Procedure Laterality Date  . ABDOMINAL HYSTERECTOMY    . 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 BREAST BIOPSY    . BREAST LUMPECTOMY Right 2003   Radiatiomn Therapy DCIS  . CHOLECYSTECTOMY  2000  . COLECTOMY  10/31/2012   SECONDARY TO COLONIC INERTIA  . 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 SUPRACERVICAL ABDOMINAL HYSTERECTOMY  04/10/2011   But still has ovaries.    There were no vitals filed for this visit.   Subjective Assessment - 11/03/19 1302    Subjective Patient reports she had a little discomfort after she left here last time and that is the last time she remebers having pain. It went away by the next morning. Her hip is fine as long as she doesn't do anything. She has been on vacation and not doing anything. She saw the doctor today who said she could go back to dancing as long as  she modifies her activity. Reports her pain is more in the posterior hip/glute region now. States she did some dancing that she thought may cause problems while on vacation, but she did not have any lingering pain. Patient reports doctor ordered an MRI but it is not for another month or more and he instructed her to continue with PT.    Pertinent History L hip pain. Pain began middle of May 2021, gradual onset. Saw MD who gave her a cortisone shot and exercises. Went to a belly dancing class and felt a pop. L leg buckled afterwards. Returned to her MD, got a cortisone shot and an ultrasound which revealed L hip bursitis in addition to her L hip tendinosis. Pt also has a small fracture in her R pinky toe which began [employer]. Accidentally kicked her toiletry bag. R pinky toe fracture improving. L hip pain improving overall because pt states that she is not doing anything. Likes to be active and not doing anything makes her miserable.  Prior to injury, pt was taking dance clases: belly dancing, latin fusion, [place] African, hula class. Pt also works out with a Physiological scientist, 3 days a week, 30 minute session (a lot of arms and abs). Her MD gave her  hip abductor stretch, TFL stretch which really bothers her.    Patient Stated Goals Get back into the gym, return to dancing without limitations.    Currently in Pain? No/denies    Pain Score 0-No pain    Pain Onset More than a month ago           No latex band allergies  No blood pressure problems per pt.   TTP L glute med tendon, TFL muscle, piriformis muscle tendon with reproduction of symptoms.  No pain with L hip abduction AROM observed.  Therapeutic exercise:to centralize symptoms and improve ROM, strength, muscular endurance, and activity tolerance required for successful completion of functional activities.  Side stepping 32 ft to the R and 32 ft to the L Then with yellow band around distal thighs 3x each  direction  Hip circles on furniture slider, yellow band resist at ankles, counter clockwise and clockwise directions, B UE assist R 10x2 L 10x2  Hip abduction on furniture slider with yellow band resistance around ankles, B UE assist, cues for neutral femur R 10x2 L 10x2  S/L hip abduction   L 10x3  S/L hip ER (clam shell)  L 10x3  Prone glute max extension  L 10x3 with 5 seconds R 10x3 with 5 seconds  Manual therapy: to reduce pain and tissue tension, improve range of motion, neuromodulation, in order to promote improved ability to complete functional activities. - STM to the left glute deep rotators and glute max region with sustained pressure to the tender and tight regions. Very sensitive to palpation at first, but able to relax with improved tolerance after.   Pt required multimodal cuing for proper technique and to facilitate improved neuromuscular control, strength, range of motion, and functional ability resulting in improved performance and form.  HOME EXERCISE PROGRAM exercises added: Exercises Clam - 4 x weekly - 3 sets - 10 reps - 2-1 seconds hold  Response to treatment Pt tolerated session well with mild increase in discomfort at left hip, similar to past sessions.   Clinical impression Patient tolerated treatment well overall but did develop some light discomfort over the left hip with weight bearing exercises. Was quite sensitive to manual therapy but did not have lasting discomfort or relief following. Returned to prior exercises due to extended period away from her usual exercise routine and poor participation in HEP while away enjoying vacation. Added hip rotation exercise related to location of pain and tightness in glute. Well tolerated by patient. Continued discussion of POC and returning to prior level of activity. Patient would benefit from continued management of  limiting condition by skilled physical therapist to address remaining impairments and functional limitations to work towards stated goals and return to PLOF or maximal functional independence.      PT Education - 11/03/19 1308    Education Details Exercise purpose/form. Self management techniques.    Person(s) Educated Patient    Methods Explanation;Demonstration;Tactile cues;Verbal cues    Comprehension Verbalized understanding;Returned demonstration;Verbal cues required;Tactile cues required;Need further instruction            PT Short Term Goals - 10/22/19 1146      PT SHORT TERM GOAL #1   Title Patient will be independent with her HEP to decrease pain, improve ability to return to her normal workout routine.    Baseline Pt has started her HEP (10/13/2019)    Time 3    Period Weeks    Status Achieved    Target Date 11/05/19  PT Long Term Goals - 10/13/19 1827      PT LONG TERM GOAL #1   Title Patient will have a decrease in L hip pain to 2/10 or less at worst to promote ability to tolerate activities which involve hip adduction, as well as to promote ability to return to her normal exercise routine.    Baseline 4/10 L hip pain at most for the past 2 weeks (10/13/2019)    Time 8    Period Weeks    Status New    Target Date 12/10/19      PT LONG TERM GOAL #2   Title Patient will improve L hip abduction and extension strength by at least 1/2 MMT grade to promote ability to perform standing tasks more comfortably.    Baseline L hip abduction 4-/5 with pain, hip extension 4-/5 (10/13/2019)    Time 8    Period Weeks    Status New    Target Date 12/10/19      PT LONG TERM GOAL #3   Title Patient will improve her hip FOTO score by at least 10 points as a demonstration of improved function.    Baseline Hip FOTO 43 (10/13/2019)    Time 8    Period Weeks    Status New    Target Date 12/10/19                 Plan - 11/03/19 1404    Clinical Impression  Statement Patient tolerated treatment well overall but did develop some light discomfort over the left hip with weight bearing exercises. Was quite sensitive to manual therapy but did not have lasting discomfort or relief following. Returned to prior exercises due to extended period away from her usual exercise routine and poor participation in HEP while away enjoying vacation. Added hip rotation exercise related to location of pain and tightness in glute. Well tolerated by patient. Continued discussion of POC and returning to prior level of activity. Patient would benefit from continued management of limiting condition by skilled physical therapist to address remaining impairments and functional limitations to work towards stated goals and return to PLOF or maximal functional independence.    Personal Factors and Comorbidities Comorbidity 3+    Comorbidities Hx of breast CA, fibromyalgia, hx of radiation therapy    Examination-Activity Limitations Sleep;Locomotion Level;Stairs    Stability/Clinical Decision Making Stable/Uncomplicated    Rehab Potential Fair    PT Frequency 2x / week    PT Duration 8 weeks    PT Treatment/Interventions Aquatic Therapy;Electrical Stimulation;Iontophoresis 4mg /ml Dexamethasone;Therapeutic activities;Therapeutic exercise;Neuromuscular re-education;Patient/family education;Manual techniques;Dry needling    PT Next Visit Plan isometric, concentric, eccentric glute med, piriformis musle strengthening, lumbopelvic and femoral control, manual techniques, modalities PRN    Consulted and Agree with Plan of Care Patient           Patient will benefit from skilled therapeutic intervention in order to improve the following deficits and impairments:     Visit Diagnosis: Pain in left hip  Muscle weakness (generalized)     Problem List Patient Active Problem List   Diagnosis Date Noted  . 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 History of tobacco abuse 03/13/2019   . Encounter for tobacco use cessation counseling 03/13/2019  . Genetic testing 01/19/2019  . Family history of brain cancer   . Family history of colon cancer   . Recurrent UTI 12/08/2018  . 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  Hematuria 12/05/2017  . Rectal bleeding 09/02/2017  . Encounter for preventive health examination 11/22/2016  . History of abdominal supracervical subtotal hysterectomy 11/22/2016  . Plantar fasciitis 11/10/2015  . Vaginitis 03/18/2015  . Menopause 01/18/2015  . Hypercholesteremia 10/05/2014  . Vitamin D deficiency 10/05/2014  . Depressive disorder 10/05/2014  . History of breast cancer 10/05/2014  . Migraine without aura with status migrainosus 10/05/2014  . 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 Insomnia 10/05/2014  . Bipolar 2 disorder ([place]) 10/05/2014  . IBS (irritable bowel syndrome) 10/05/2014  . Obstructive sleep apnea 10/05/2014  . S/P colectomy 10/05/2014  . Hypersomnia 10/05/2014  . Anxiety 10/05/2014  . Fibromyalgia 10/05/2014  . Periodic limb movement disorder 10/05/2014    Everlean Alstrom. Graylon Good, PT, DPT 11/03/19, 2:05 PM  [facility] [address] [place], [address] Phone: [phone]   Fax:  [phone]  Name: Gwendolyn [name] MRN: [id number] Date of Birth: [birth year]

## 2019-11-03 NOTE — Patient Instructions (Signed)
Thank you for coming in today. Plan for continued PT and home exercises.  We will try to get an MRI scheduled. Recheck after MRI on in about 1 month.

## 2019-11-03 NOTE — Progress Notes (Signed)
Rito Ehrlich, am serving as a Education administrator for Dr. [name].  Gwendolyn [name] is a 49 y.o. female who presents to [place] at [facility] today for Lateral L hip pain. Patient was last seen by Dr. Georgina Snell on 10/06/2019 for hip pain and stated Since her last visit, she reports having suffered a new injury while doing a dance class on Saturday when she felt a pop in her L hip that caused immediate pain.  She reports overall increased pain since her last visit and is limping when she walks.  Her symptoms are worsened w/ L LE weight bearing.  She does note that her symptoms today are better then they were on Saturday.  She denies any con't L hip mechanical symptoms. Patient had an x-ray and was referred to PT.  Since last visit patient has had 4 PT sessions and states that she is feeling the same. PT seems to be helping, but she has not done any dance classes or anything to see if it will flare up.    Pertinent review of systems: No fevers or chills  Relevant historical information: History of migraines.  Avid dancer   Exam:  BP 100/64 (BP Location: Left Arm, Patient Position: Sitting, Cuff Size: Normal)   Pulse 91   Ht 5\' 7"  (1.702 m)   Wt 167 lb (75.8 kg)   SpO2 98%   BMI 26.16 kg/m  General: Well Developed, well nourished, and in no acute distress.   MSK: Left hip normal-appearing normal motion.  Still tender to palpation greater trochanter.  Hip abduction strength is improved 4+/5.  External rotation strength is also improved 4+/5. Normal gait.    Lab and Radiology Results EXAM: DG HIP (WITH OR WITHOUT PELVIS) 2-3V LEFT  COMPARISON:  None.  FINDINGS: There is no evidence of hip fracture or dislocation. Hip joint space is maintained. Mild degenerative spurring of the bilateral SI joints. There is no evidence of focal bone abnormality.  IMPRESSION: 1. No acute osseous abnormality, left hip. 2. Mild arthropathy of the bilateral SI  joints.   Electronically Signed   By: Davina Poke D.O.   On: 10/06/2019 16:33 I, [name], personally (independently) visualized and performed the interpretation of the images attached in this note.     Assessment and Plan: 49 y.o. female with left lateral hip pain to trochanteric bursitis likely.  Slightly improved but still having quite a bit of difficulty with conservative management.  She already had an injection.  Discussed options.  Plan for MRI to further characterize pain and for potential further injection planning.  Patient has breast MRI already scheduled on July 20 here in [place].  We will try to get it scheduled for then.  If not she is okay again later.   PDMP not reviewed this encounter. Orders Placed This Encounter  Procedures  . MR HIP LEFT WO CONTRAST    Standing Status:   Future    Standing Expiration Date:   11/02/2020    Scheduling Instructions:     Has breast MRI scheduled for 7/20. Can the hip be done at the same time?    Order Specific Question:   What is the patient's sedation requirement?    Answer:   No Sedation    Order Specific Question:   Does the patient have a pacemaker or implanted devices?    Answer:   No    Order Specific Question:   Preferred imaging location?    Answer:  [facility] (table limit-550lbs)    Order Specific Question:   Radiology Contrast Protocol - do NOT remove file path    Answer:   \\[email or web address]   No orders of the defined types were placed in this encounter.    Discussed warning signs or symptoms. Please see discharge instructions. Patient expresses understanding.   The above documentation has been reviewed and is accurate and complete [name], M.D.

## 2019-11-05 ENCOUNTER — Encounter: Payer: BC Managed Care – PPO | Admitting: Physical Therapy

## 2019-11-10 ENCOUNTER — Ambulatory Visit
Admission: RE | Admit: 2019-11-10 | Discharge: 2019-11-10 | Disposition: A | Discharge status: Home / Self Care | Payer: BC Managed Care – PPO | Source: Ambulatory Visit | Attending: Nurse Practitioner | Admitting: Nurse Practitioner

## 2019-11-10 ENCOUNTER — Encounter: Payer: Self-pay | Admitting: Physical Therapy

## 2019-11-10 ENCOUNTER — Other Ambulatory Visit: Payer: Self-pay

## 2019-11-10 ENCOUNTER — Ambulatory Visit: Payer: BC Managed Care – PPO | Admitting: Physical Therapy

## 2019-11-10 DIAGNOSIS — M6281 Muscle weakness (generalized): Secondary | ICD-10-CM

## 2019-11-10 DIAGNOSIS — M25552 Pain in left hip: Secondary | ICD-10-CM | POA: Diagnosis not present

## 2019-11-10 DIAGNOSIS — D0511 Intraductal carcinoma in situ of right breast: Secondary | ICD-10-CM

## 2019-11-10 MED ORDER — GADOBUTROL 1 MMOL/ML IV SOLN
7.0000 mL | Freq: Once | INTRAVENOUS | Status: AC | PRN
  Administered 2019-11-10: 7 mL via INTRAVENOUS

## 2019-11-10 NOTE — Therapy (Signed)
[facility] [address] [place], [address] Phone: [phone]   Fax:  [phone]  Physical Therapy Treatment  Patient Details  Name: Gwendolyn [name] MRN: [id number] Date of Birth: 1970/05/29 Referring Provider (PT): Gregor Hams, MD   Encounter Date: 11/10/2019   PT End of Session - 11/10/19 1556    Visit Number 6    Number of Visits 17    Date for PT Re-Evaluation 12/10/19    PT Start Time 1520    PT Stop Time 1600    PT Time Calculation (min) 40 min    Activity Tolerance Patient tolerated treatment well    Behavior During Therapy [facility] for tasks assessed/performed           Past Medical History:  Diagnosis Date  . Anal fissure   . Anxiety   . Breast cancer ([place]) 2002   lumpectomy-Right  . Depression   . Family history of brain cancer   . Family history of colon cancer   . Fibromyalgia   . Gallstones   . Irritable bowel syndrome   . Personal history of radiation therapy   . Yeast vaginitis     Past Surgical History:  Procedure Laterality Date  . ABDOMINAL HYSTERECTOMY    . 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 BREAST BIOPSY    . BREAST LUMPECTOMY Right 2003   Radiatiomn Therapy DCIS  . CHOLECYSTECTOMY  2000  . COLECTOMY  10/31/2012   SECONDARY TO COLONIC INERTIA  . REDUCTION MAMMAPLASTY Bilateral 11/13/2018   right breast 59mm DCIS found during breast reduction  . SUPRACERVICAL ABDOMINAL HYSTERECTOMY  04/10/2011   But still has ovaries.    There were no vitals filed for this visit.   Subjective Assessment - 11/10/19 1519    Subjective Patient reports she tried a Consulting civil engineer camp this morning and felt pain when she did some jogging laps around the gym perimeter. She is pretty achy in the hip 5/10, worse when she puts weight on it.    Pertinent History L hip pain. Pain began middle of May 2021, gradual onset. Saw MD who gave her a cortisone shot and exercises.  Went to a belly dancing class and felt a pop. L leg buckled afterwards. Returned to her MD, got a cortisone shot and an ultrasound which revealed L hip bursitis in addition to her L hip tendinosis. Pt also has a small fracture in her R pinky toe which began [employer]. Accidentally kicked her toiletry bag. R pinky toe fracture improving. L hip pain improving overall because pt states that she is not doing anything. Likes to be active and not doing anything makes her miserable.  Prior to injury, pt was taking dance clases: belly dancing, latin fusion, [place] African, hula class. Pt also works out with a Physiological scientist, 3 days a week, 30 minute session (a lot of arms and abs). Her MD gave her hip abductor stretch, TFL stretch which really bothers her.    Patient Stated Goals Get back into the gym, return to dancing without limitations.    Currently in Pain? Yes    Pain Location Hip    Pain Orientation Left    Pain Onset More than a month ago           No latex band allergies  No blood pressure problems per pt.   Manual therapy: to reduce pain and tissue tension, improve range of  motion, neuromodulation, in order to promote improved ability to complete functional activities. Prone:  - STM to the left glute deep rotators and glute max region with sustained pressure to the tender and tight regions. Very sensitive to palpation at first, but able to relax with improved tolerance after.   Therapeutic exercise:to centralize symptoms and improve ROM, strength, muscular endurance, and activity tolerance required for successful completion of functional activities.   S/L hip ER (clam shell) with yellow theraband loop             L 10x3  S/L hip IR (reverse clam shell)  L 10x3  Prone glute max extension (with knee maximally flexed).   L 10x3 with 5 seconds R 10x3 with 5 seconds  (last set added 3# DB behind knee)  Prone hip extension to abduction and back  with yellow theraband around ankles.   3x10   Glute bridge   1x 10  Single leg glute bridge  3x10 each side  Hooklying isometric hip adduction with pink theraball between knees.   10x10 seconds  S/L hip abduction  L 10x3  Pt required multimodal cuing for proper technique and to facilitate improved neuromuscular control, strength, range of motion, and functional ability resulting in improved performance and form.  HOME EXERCISE PROGRAMexercises added: Exercises Clam - 4 x weekly - 3 sets - 10 reps - 2-1 seconds hold     PT Education - 11/10/19 1524    Education Details Exercise purpose/form. Self management techniques.    Person(s) Educated Patient    Methods Explanation;Demonstration;Tactile cues;Verbal cues    Comprehension Verbalized understanding;Returned demonstration;Verbal cues required;Tactile cues required;Need further instruction            PT Short Term Goals - 10/22/19 1146      PT SHORT TERM GOAL #1   Title Patient will be independent with her HEP to decrease pain, improve ability to return to her normal workout routine.    Baseline Pt has started her HEP (10/13/2019)    Time 3    Period Weeks    Status Achieved    Target Date 11/05/19             PT Long Term Goals - 10/13/19 1827      PT LONG TERM GOAL #1   Title Patient will have a decrease in L hip pain to 2/10 or less at worst to promote ability to tolerate activities which involve hip adduction, as well as to promote ability to return to her normal exercise routine.    Baseline 4/10 L hip pain at most for the past 2 weeks (10/13/2019)    Time 8    Period Weeks    Status New    Target Date 12/10/19      PT LONG TERM GOAL #2   Title Patient will improve L hip abduction and extension strength by at least 1/2 MMT grade to promote ability to perform standing tasks more comfortably.    Baseline L hip abduction 4-/5 with pain, hip extension 4-/5 (10/13/2019)    Time 8    Period  Weeks    Status New    Target Date 12/10/19      PT LONG TERM GOAL #3   Title Patient will improve her hip FOTO score by at least 10 points as a demonstration of improved function.    Baseline Hip FOTO 43 (10/13/2019)    Time 8    Period Weeks    Status New  Target Date 12/10/19                 Plan - 11/10/19 1556    Clinical Impression Statement Patient tolerated treatment well overall.  Avoided full weight bearing exercises due to presentation with pain during L LE weight bearing at start of session after aggravating it at bootcamp this morning. Discussed graded return to activity and natural healing process. Pain no worse by end of session. Patient would benefit from continued management of limiting condition by skilled physical therapist to address remaining impairments and functional limitations to work towards stated goals and return to PLOF or maximal functional independence.    Personal Factors and Comorbidities Comorbidity 3+    Comorbidities Hx of breast CA, fibromyalgia, hx of radiation therapy    Examination-Activity Limitations Sleep;Locomotion Level;Stairs    Stability/Clinical Decision Making Stable/Uncomplicated    Rehab Potential Fair    PT Frequency 2x / week    PT Duration 8 weeks    PT Treatment/Interventions Aquatic Therapy;Electrical Stimulation;Iontophoresis 4mg /ml Dexamethasone;Therapeutic activities;Therapeutic exercise;Neuromuscular re-education;Patient/family education;Manual techniques;Dry needling    PT Next Visit Plan isometric, concentric, eccentric glute med, piriformis musle strengthening, lumbopelvic and femoral control, manual techniques, modalities PRN    Consulted and Agree with Plan of Care Patient           Patient will benefit from skilled therapeutic intervention in order to improve the following deficits and impairments:     Visit Diagnosis: Pain in left hip  Muscle weakness (generalized)     Problem List Patient Active  Problem List   Diagnosis Date Noted  . 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 History of tobacco abuse 03/13/2019  . Encounter for tobacco use cessation counseling 03/13/2019  . Genetic testing 01/19/2019  . Family history of brain cancer   . Family history of colon cancer   . Recurrent UTI 12/08/2018  . 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 Hematuria 12/05/2017  . Rectal bleeding 09/02/2017  . Encounter for preventive health examination 11/22/2016  . History of abdominal supracervical subtotal hysterectomy 11/22/2016  . Plantar fasciitis 11/10/2015  . Vaginitis 03/18/2015  . Menopause 01/18/2015  . Hypercholesteremia 10/05/2014  . Vitamin D deficiency 10/05/2014  . Depressive disorder 10/05/2014  . History of breast cancer 10/05/2014  . Migraine without aura with status migrainosus 10/05/2014  . 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 Insomnia 10/05/2014  . Bipolar 2 disorder ([place]) 10/05/2014  . IBS (irritable bowel syndrome) 10/05/2014  . Obstructive sleep apnea 10/05/2014  . S/P colectomy 10/05/2014  . Hypersomnia 10/05/2014  . Anxiety 10/05/2014  . Fibromyalgia 10/05/2014  . Periodic limb movement disorder 10/05/2014    Everlean Alstrom. Graylon Good, PT, DPT 11/10/19, 4:02 PM  [facility] [address] [place], [address] Phone: [phone]   Fax:  [phone]  Name: Gwendolyn [name] MRN: [id number] Date of Birth: 1971-03-10

## 2019-11-12 ENCOUNTER — Ambulatory Visit: Payer: BC Managed Care – PPO | Admitting: Physical Therapy

## 2019-11-16 ENCOUNTER — Ambulatory Visit: Payer: BC Managed Care – PPO

## 2019-11-17 NOTE — Progress Notes (Signed)
[facility]   Telephone:([phone]) [phone] Fax:([phone]) [phone]   Clinic Follow up Note   Patient Care Team: [name], MD as PCP - General (Internal Medicine) Alphonsa Overall, MD as Consulting Physician (General Surgery) 11/18/2019  CHIEF COMPLAINT: Follow-up DCIS of right breast  SUMMARY OF ONCOLOGIC HISTORY: Oncology History Overview Note  Cancer Staging No matching staging information was found for the patient.    DCIS (ductal carcinoma in situ)  11/06/2018 Mammogram   Mammogram 11/06/18  IMPRESSION: No mammographic evidence of malignancy. A result letter of this screening mammogram will be mailed directly to the patient.   11/13/2018 Surgery   B/l Breast Reduction   11/13/2018 Pathology Results    Diagnosis 11/13/18 1. Breast, Mammoplasty, right tissue, 76g - DUCTAL CARCINOMA IN SITU. 0.4cm 2. Breast, Mammoplasty, left tissue, 212g - BENIGN BREAST TISSUE.  Estrogen Receptor: 95%, POSITIVE, STRONG STAINING INTENSITY Progesterone Receptor: 95%, POSITIVE, STRONG STAINING INTENSITY   12/06/2018 Initial Diagnosis   Ductal carcinoma in situ (DCIS) of right breast   01/16/2019 Genetic Testing   Negative genetic testing on the common hereditary cancer panel.  The Common Hereditary Gene Panel offered by [facility] includes sequencing and/or deletion duplication testing of the following 48 genes: APC, ATM, AXIN2, BARD1, BMPR1A, BRCA1, BRCA2, BRIP1, CDH1, CDK4, CDKN2A (p14ARF), CDKN2A (p16INK4a), CHEK2, CTNNA1, DICER1, EPCAM (Deletion/duplication testing only), GREM1 (promoter region deletion/duplication testing only), KIT, MEN1, MLH1, MSH2, MSH3, MSH6, MUTYH, NBN, NF1, NHTL1, PALB2, PDGFRA, PMS2, POLD1, POLE, PTEN, RAD50, RAD51C, RAD51D, RNF43, SDHB, SDHC, SDHD, SMAD4, SMARCA4. STK11, TP53, TSC1, TSC2, and VHL.  The following genes were evaluated for sequence changes only: SDHA and HOXB13 c.251G>A variant only. The report date is January 16, 2019.   02/24/2019 Breast MRI    IMPRESSION: 1. Indeterminate 6 mm enhancing mass in the upper outer left breast. Recommendation is for diagnostic evaluation with mammography and ultrasound. If there is no mammographic or sonographic correlate, recommendation is to proceed with MRI guided biopsy. 2. No suspicious MRI findings on the right.   03/02/2019 Breast US   Bilateral mammo/L br US IMPRESSION: Expected bilateral post reduction surgical scarring as described. No focal abnormality over the upper outer left breast to correlate to the recent MRI finding.   03/12/2019 Breast MRI   MRI Guided biopsy attempt IMPRESSION: The small mass in the upper-outer quadrant of the left breast which was the target of the intended MRI guided biopsy today is obscured by marked increase in background parenchymal enhancement. Therefore the biopsy was canceled, and will need to be rescheduled.   03/24/2019 Pathology Results   Diagnosis Breast, left, needle core biopsy, upper outer - FIBROCYSTIC CHANGES. - PRIOR PROCEDURE SITE CHANGES.   11/10/2019 Breast MRI   IMPRESSION: No MR evidence of breast malignancy. RECOMMENDATION: Bilateral diagnostic mammogram in 6 months to resume annual mammogram schedule in this patient with history of RIGHT DCIS in 2020     CURRENT THERAPY: Close surveillance, alternating every 6 month mammogram and breast MRI  INTERVAL HISTORY: Gwendolyn [name] returns for follow-up.  She was last seen in initial consultation on 12/25/2018.  In the interim she has been evaluated with mammogram/ultrasound and breast MRI.  She required a left upper outer quadrant biopsy in December 2020 that showed fibrocystic changes. Most recently she had a breast MRI on 11/10/19 that was negative.  She denies any concerns in her breast such as new lump/mass, nipple discharge, or skin change.  Her energy and appetite are adequate.  Denies changes in her  bowel habits, bleeding, bone or joint pain.  Mood is stable.  Denies recent  infection.  She received the Covid 19 vaccine series.    MEDICAL HISTORY:  Past Medical History:  Diagnosis Date  . Anal fissure   . Anxiety   . Breast cancer ([place]) 2002   lumpectomy-Right  . Depression   . Family history of brain cancer   . Family history of colon cancer   . Fibromyalgia   . Gallstones   . Irritable bowel syndrome   . Personal history of radiation therapy   . Yeast vaginitis     SURGICAL HISTORY: Past Surgical History:  Procedure Laterality Date  . ABDOMINAL HYSTERECTOMY    . 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 BREAST BIOPSY    . BREAST LUMPECTOMY Right 2003   Radiatiomn Therapy DCIS  . CHOLECYSTECTOMY  2000  . COLECTOMY  10/31/2012   SECONDARY TO COLONIC INERTIA  . REDUCTION MAMMAPLASTY Bilateral 11/13/2018   right breast 26m DCIS found during breast reduction  . SUPRACERVICAL ABDOMINAL HYSTERECTOMY  04/10/2011   But still has ovaries.    I have reviewed the social history and family history with the patient and they are unchanged from previous note.  ALLERGIES:  is allergic to amoxicillin-pot clavulanate, morphine, and penicillins.  MEDICATIONS:  Current Outpatient Medications  Medication Sig Dispense Refill  . amphetamine-dextroamphetamine (ADDERALL) 30 MG tablet Take 30 mg by mouth 2 (two) times daily.   0  . clotrimazole-betamethasone (LOTRISONE) cream Apply 1 application topically 2 (two) times daily. 30 g 0  . DULoxetine HCl 40 MG CPEP Take 2 capsules by mouth every morning.    . gabapentin (NEURONTIN) 800 MG tablet TAKE 1 TABLET (800 MG TOTAL) BY MOUTH NIGHTLY. 90 tablet 1  . Ginkgo Biloba 40 MG TABS Take by mouth.    . Melatonin 3 MG TABS Take 1 tablet by mouth at bedtime.    . meloxicam (MOBIC) 15 MG tablet Take 1 tablet (15 mg total) by mouth daily. 30 tablet 2  . metFORMIN (GLUCOPHAGE-XR) 500 MG 24 hr tablet TAKE 1 TABLET BY MOUTH EVERY DAY WITH BREAKFAST 90 tablet 1  . sertraline (ZOLOFT) 25 MG tablet Take 25 mg by mouth  daily.    . tiaGABine (GABITRIL) 4 MG tablet Takes '4mg'$  in the AM and '12mg'$  at bedtime    . trimethoprim (TRIMPEX) 100 MG tablet Take 1 tablet (100 mg total) by mouth as needed (post-coital prophylaxis to prevent UTIs). 30 tablet 3  . varenicline (CHANTIX) 1 MG tablet Take 1 tablet (1 mg total) by mouth 2 (two) times daily. 60 tablet 5  . zolpidem (AMBIEN) 10 MG tablet Take 1 tablet (10 mg total) by mouth at bedtime as needed for sleep. 30 tablet 5  . hydrocortisone (ANUSOL-HC) 25 MG suppository Place 25 mg rectally 2 (two) times daily as needed for hemorrhoids or anal itching. (Patient not taking: Reported on 10/13/2019)     No current facility-administered medications for this visit.    PHYSICAL EXAMINATION: ECOG PERFORMANCE STATUS: 0 - Asymptomatic  Vitals:   11/18/19 1200  BP: 100/73  Pulse: 82  Resp: 18  SpO2: 98%   Filed Weights   11/18/19 1200  Weight: 168 lb (76.2 kg)    GENERAL:alert, no distress and comfortable SKIN: No rash to exposed skin EYES: sclera clear NECK: Without mass LYMPH:  no palpable cervical or supraclavicular lymphadenopathy  LUNGS:  normal breathing effort HEART:  no lower extremity edema  NEURO: alert & oriented x 3 with fluent speech Breast exam: S/p right lumpectomy and bilateral breast reduction.  Surgical incisions completely healed.  No palpable mass or nodularity in either breast or axilla that I could appreciate.  LABORATORY DATA:  I have reviewed the data as listed CBC Latest Ref Rng & Units 10/21/2019 03/29/2018 03/28/2018  WBC 3.8 - 10.8 Thousand/uL 7.3 7.7 10.5  Hemoglobin 11.7 - 15.5 g/dL 13.5 12.5 13.3  Hematocrit 35 - 45 % 41.0 40.1 41.7  Platelets 140 - 400 Thousand/uL 250 267 289     CMP Latest Ref Rng & Units 10/21/2019 06/05/2018 03/26/2018  Glucose 65 - 99 mg/dL 94 96 100(H)  BUN 7 - 25 mg/dL '11 11 15  '$ Creatinine 0.50 - 1.05 mg/dL 0.94 0.81 0.87  Sodium 135 - 146 mmol/L 140 136 141  Potassium 3.5 - 5.3 mmol/L 4.1 4.2 4.0    Chloride 98 - 110 mmol/L 104 103 105  CO2 20 - 32 mmol/L '29 23 27  '$ Calcium 8.6 - 10.4 mg/dL 9.0 9.4 8.8(L)  Total Protein 6.1 - 8.1 g/dL 6.1 7.3 6.8  Total Bilirubin 0.2 - 1.2 mg/dL 0.6 0.6 0.4  Alkaline Phos 39 - 117 U/L - 29(L) 31(L)  AST 10 - 35 U/L '14 17 19  '$ ALT 6 - 29 U/L '11 13 14      '$ RADIOGRAPHIC STUDIES: I have personally reviewed the radiological images as listed and agreed with the findings in the report. No results found.   ASSESSMENT & PLAN: Gwendolyn [name] is a 49 y.o. 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   -Underwent b/l breast reduction on 11/13/18. 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 This was completely surgically removed. -Based on the [facility] calculator, her 10-year risk of future breast cancer is 19%, which will be reduced with radiation, or antiestrogen therapy.   Due to her previous right breast radiation in the past, additional radiation therapy is not recommended -Genetic testing was negative on 12/2018 -Given the strongly positive ER/PR and premenopausal status, Dr. Morey Hummingbird recommended antiestrogen therapy with tamoxifen for 5 years, patient declined due to concern for side effects -Due to her high risk for future breast cancer, she is under close surveillance with annual mammogram and breast MRI 6 months apart especially since her second DCIS was not detected by mammogram and her breast density is category C -Mammo-/ultrasound on 03/02/2019 showed abnormality, path revealed fibrocystic breast change -MRI on 11/10/2019 was negative  for malignancy  2. Genetic Testing  -She does not have a family history of cancer but she has had a second DCIS in the same breast. She is eligible for genetic testing.  -She did have testing 7 years ago in [place], [place], but is interested in further testing. -Negative result on 01/06/2019  3. 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 Hassinger is clinically doing well.  Breast exam is benign, recent labs 10/21/19 were unremarkable. Overall no clinical concern for recurrence.  Breast MRI on 11/10/2019 is negative, I reviewed the report with her today.  We will continue close  surveillance.  She will be due for repeat mammogram in 6 months then breast MRI 6 months after that.  She will return for lab and follow-up in 6 months then annually.  Will alternate visits with PCP.   All questions were answered. The patient knows to call the clinic with any problems, questions or concerns. No barriers to learning were detected.     Gwendolyn Feeling, NP 11/18/19

## 2019-11-18 ENCOUNTER — Telehealth: Payer: Self-pay | Admitting: Nurse Practitioner

## 2019-11-18 ENCOUNTER — Encounter: Payer: Self-pay | Admitting: Nurse Practitioner

## 2019-11-18 ENCOUNTER — Inpatient Hospital Stay: Payer: BC Managed Care – PPO | Attending: Nurse Practitioner | Admitting: Nurse Practitioner

## 2019-11-18 ENCOUNTER — Other Ambulatory Visit: Payer: Self-pay

## 2019-11-18 VITALS — BP 100/73 | HR 82 | Resp 18 | Wt 168.0 lb

## 2019-11-18 DIAGNOSIS — Z808 Family history of malignant neoplasm of other organs or systems: Secondary | ICD-10-CM | POA: Insufficient documentation

## 2019-11-18 DIAGNOSIS — Z791 Long term (current) use of non-steroidal anti-inflammatories (NSAID): Secondary | ICD-10-CM | POA: Diagnosis not present

## 2019-11-18 DIAGNOSIS — D0511 Intraductal carcinoma in situ of right breast: Secondary | ICD-10-CM | POA: Diagnosis not present

## 2019-11-18 DIAGNOSIS — Z7984 Long term (current) use of oral hypoglycemic drugs: Secondary | ICD-10-CM | POA: Diagnosis not present

## 2019-11-18 DIAGNOSIS — Z170 Estrogen receptor positive status [ER+]: Secondary | ICD-10-CM | POA: Diagnosis not present

## 2019-11-18 DIAGNOSIS — Z923 Personal history of irradiation: Secondary | ICD-10-CM | POA: Diagnosis not present

## 2019-11-18 DIAGNOSIS — Z800 Family history of malignant neoplasm of digestive organs: Secondary | ICD-10-CM | POA: Diagnosis not present

## 2019-11-18 DIAGNOSIS — Z79899 Other long term (current) drug therapy: Secondary | ICD-10-CM | POA: Diagnosis not present

## 2019-11-18 DIAGNOSIS — F329 Major depressive disorder, single episode, unspecified: Secondary | ICD-10-CM | POA: Insufficient documentation

## 2019-11-18 DIAGNOSIS — K589 Irritable bowel syndrome without diarrhea: Secondary | ICD-10-CM | POA: Diagnosis not present

## 2019-11-18 DIAGNOSIS — F419 Anxiety disorder, unspecified: Secondary | ICD-10-CM | POA: Insufficient documentation

## 2019-11-18 DIAGNOSIS — Z90710 Acquired absence of both cervix and uterus: Secondary | ICD-10-CM | POA: Insufficient documentation

## 2019-11-18 NOTE — Telephone Encounter (Signed)
Scheduled per 7/28 los. Pt is aware of appt time and date. No avs or calendar needed to be printed

## 2019-11-23 ENCOUNTER — Other Ambulatory Visit: Payer: Self-pay

## 2019-11-23 ENCOUNTER — Ambulatory Visit: Payer: BC Managed Care – PPO | Admitting: Primary Care

## 2019-11-23 ENCOUNTER — Encounter: Payer: Self-pay | Admitting: Primary Care

## 2019-11-23 VITALS — BP 124/76 | HR 82 | Temp 96.5°F | Ht 67.0 in | Wt 171.5 lb

## 2019-11-23 DIAGNOSIS — K649 Unspecified hemorrhoids: Secondary | ICD-10-CM | POA: Diagnosis not present

## 2019-11-23 DIAGNOSIS — N898 Other specified noninflammatory disorders of vagina: Secondary | ICD-10-CM | POA: Insufficient documentation

## 2019-11-23 LAB — POC URINALSYSI DIPSTICK (AUTOMATED)
Bilirubin, UA: NEGATIVE
Glucose, UA: NEGATIVE
Ketones, UA: NEGATIVE
Leukocytes, UA: NEGATIVE
Nitrite, UA: NEGATIVE
Protein, UA: NEGATIVE
Spec Grav, UA: 1.015 (ref 1.010–1.025)
Urobilinogen, UA: 0.2 E.U./dL
pH, UA: 5 (ref 5.0–8.0)

## 2019-11-23 MED ORDER — HYDROCORTISONE ACETATE 25 MG RE SUPP: 25.0000 mg | Freq: Two times a day (BID) | RECTAL | 0 refills | Status: DC | PRN

## 2019-11-23 NOTE — Assessment & Plan Note (Signed)
Anal skin tag vs external hemorrhoid noted. Internal hemorrhoid noted on exam. Refill provided for anusol-HC suppositories.

## 2019-11-23 NOTE — Progress Notes (Signed)
Subjective:    Patient ID: Gwendolyn [name], female    DOB: [birth year], 49 y.o.   MRN: [id number]  HPI  This visit occurred during the SARS-CoV-2 public health emergency.  Safety protocols were in place, including screening questions prior to the visit, additional usage of staff PPE, and extensive cleaning of exam room while observing appropriate contact time as indicated for disinfecting solutions.   Gwendolyn [name] is a 49 year old female patient of Dr. Derrel Nip with a medical history of migraines, OSA, IBS, recurrent UTI, bacterial vaginosis, breast cancer who presents today with a chief complaint of vaginal discharge. She is also requesting a refill of her Anusol-HC for recurrent hemorrhoids.   She also reports vaginal discomfort when wiping, labial erythema. She denies urinary frequency, hematuria. She does have a chronic history of microscopic hematuria.    Review of Systems  Constitutional: Negative for fever.  Gastrointestinal:       Hemorrhoids   Genitourinary: Positive for vaginal discharge. Negative for dysuria and frequency.       Past Medical History:  Diagnosis Date  . Anal fissure   . Anxiety   . Breast cancer ([place]) 2002   lumpectomy-Right  . Depression   . Family history of brain cancer   . Family history of colon cancer   . Fibromyalgia   . Gallstones   . Irritable bowel syndrome   . Personal history of radiation therapy   . Yeast vaginitis      Social History   Socioeconomic History  . Marital status: Married    Spouse name: Not on file  . Number of children: 2  . Years of education: Not on file  . Highest education level: Not on file  Occupational History  . Occupation: Pharmacist, hospital  Tobacco Use  . Smoking status: Former Smoker    Packs/day: 1.00    Years: 20.00    Pack years: 20.00    Quit date: 04/23/2002    Years since quitting: 17.5  . Smokeless tobacco: Never Used  . Tobacco comment: QUIT IN 2004  Vaping Use  . Vaping Use: Never used    Substance and Sexual Activity  . Alcohol use: Yes    Alcohol/week: 0.0 standard drinks    Comment: OCCASIONALLY  . Drug use: No  . Sexual activity: Yes    Birth control/protection: Surgical  Other Topics Concern  . Not on file  Social History Narrative   Married from [place] Strain:   . Difficulty of Paying Living Expenses:   Food Insecurity:   . Worried About Charity fundraiser in the Last Year:   . Arboriculturist in the Last Year:   Transportation Needs: No Transportation Needs  . Lack of Transportation (Medical): No  . Lack of Transportation (Non-Medical): No  Physical Activity:   . Days of Exercise per Week:   . Minutes of Exercise per Session:   Stress:   . Feeling of Stress :   Social Connections:   . Frequency of Communication with Friends and Family:   . Frequency of Social Gatherings with Friends and Family:   . Attends Religious Services:   . Active Member of Clubs or Organizations:   . Attends Archivist Meetings:   Marland Kitchen Marital Status:   Intimate Partner Violence: Not At Risk  . Fear of Current or Ex-Partner: No  . Emotionally Abused: No  . Physically Abused: No  .  Sexually Abused: No    Past Surgical History:  Procedure Laterality Date  . ABDOMINAL HYSTERECTOMY    . 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 BREAST BIOPSY    . BREAST LUMPECTOMY Right 2003   Radiatiomn Therapy DCIS  . CHOLECYSTECTOMY  2000  . COLECTOMY  10/31/2012   SECONDARY TO COLONIC INERTIA  . REDUCTION MAMMAPLASTY Bilateral 11/13/2018   right breast 54mm DCIS found during breast reduction  . SUPRACERVICAL ABDOMINAL HYSTERECTOMY  04/10/2011   But still has ovaries.    Family History  Problem Relation Age of Onset  . Meniere's disease Father   . Alcohol abuse Sister   . Heart disease Maternal Grandmother   . Heart disease Maternal Grandfather   . Brain cancer Maternal Grandfather   . Heart disease  Paternal Grandmother   . Heart disease Paternal Grandfather   . Rectal cancer Other        MGF's mother  . Colon cancer Neg Hx   . Esophageal cancer Neg Hx   . Breast cancer Neg Hx     Allergies  Allergen Reactions  . Amoxicillin-Pot Clavulanate     Other reaction(s): NAUSEA (Augmentin)  . Morphine Other (See Comments)  . Penicillins     nausea, diarrhea    Current Outpatient Medications on File Prior to Visit  Medication Sig Dispense Refill  . amphetamine-dextroamphetamine (ADDERALL) 30 MG tablet Take 30 mg by mouth 2 (two) times daily.   0  . clotrimazole-betamethasone (LOTRISONE) cream Apply 1 application topically 2 (two) times daily. 30 g 0  . DULoxetine HCl 40 MG CPEP Take 2 capsules by mouth every morning.    . gabapentin (NEURONTIN) 800 MG tablet TAKE 1 TABLET (800 MG TOTAL) BY MOUTH NIGHTLY. 90 tablet 1  . Ginkgo Biloba 40 MG TABS Take by mouth.    . Melatonin 3 MG TABS Take 1 tablet by mouth at bedtime.    . meloxicam (MOBIC) 15 MG tablet Take 1 tablet (15 mg total) by mouth daily. 30 tablet 2  . metFORMIN (GLUCOPHAGE-XR) 500 MG 24 hr tablet TAKE 1 TABLET BY MOUTH EVERY DAY WITH BREAKFAST 90 tablet 1  . sertraline (ZOLOFT) 25 MG tablet Take 25 mg by mouth daily.    . tiaGABine (GABITRIL) 4 MG tablet Takes 4mg  in the AM and 12mg  at bedtime    . trimethoprim (TRIMPEX) 100 MG tablet Take 1 tablet (100 mg total) by mouth as needed (post-coital prophylaxis to prevent UTIs). 30 tablet 3  . varenicline (CHANTIX) 1 MG tablet Take 1 tablet (1 mg total) by mouth 2 (two) times daily. 60 tablet 5  . zolpidem (AMBIEN) 10 MG tablet Take 1 tablet (10 mg total) by mouth at bedtime as needed for sleep. 30 tablet 5   No current facility-administered medications on file prior to visit.    BP 124/76   Pulse 82   Temp (!) 96.5 F (35.8 C) (Temporal)   Ht 5\' 7"  (1.702 m)   Wt 171 lb 8 oz (77.8 kg)   SpO2 98%   BMI 26.86 kg/m    Objective:   Physical Exam Pulmonary:     Effort:  Pulmonary effort is normal.  Genitourinary:    Labia:        Right: No tenderness.        Left: No tenderness.      Rectum: External hemorrhoid and internal hemorrhoid present.     Comments: Erythema to labia minora. No cervix visualized.  Mild amount of whitish vaginal discharge noted, no foul smell.  Either rectal skin tags or non thrombosed hemorrhoids noted externally. Small internal hemorrhoid noted. Tender.  Skin:    General: Skin is warm and dry.  Neurological:     Mental Status: She is alert.            Assessment & Plan:

## 2019-11-23 NOTE — Assessment & Plan Note (Signed)
Acute for the last several days. Exam today overall benign.  UA today negative, except for 3+ blood which is typical for her.  Culture sent.  Wet prep sent off and pending. Swabs for gonorrhea and chlamydia sent off and pending.   Patient prefers metronidazole gel rather than oral tablets if she does test positive for BV. She may need oral fluconazole if prescribed oral metronidazole.

## 2019-11-23 NOTE — Patient Instructions (Signed)
We will be in touch soon regarding your vaginal swabs.  Refills for the suppositories were sent to the pharmacy.  It was a pleasure meeting you!

## 2019-11-24 ENCOUNTER — Telehealth: Payer: Self-pay

## 2019-11-24 ENCOUNTER — Ambulatory Visit: Payer: BC Managed Care – PPO | Attending: Family Medicine

## 2019-11-24 ENCOUNTER — Other Ambulatory Visit: Payer: Self-pay | Admitting: Primary Care

## 2019-11-24 DIAGNOSIS — B3731 Acute candidiasis of vulva and vagina: Secondary | ICD-10-CM

## 2019-11-24 DIAGNOSIS — B373 Candidiasis of vulva and vagina: Secondary | ICD-10-CM

## 2019-11-24 DIAGNOSIS — B9689 Other specified bacterial agents as the cause of diseases classified elsewhere: Secondary | ICD-10-CM

## 2019-11-24 MED ORDER — METRONIDAZOLE 0.75 % VA GEL: 1.0000 | Freq: Two times a day (BID) | VAGINAL | 0 refills | Status: DC

## 2019-11-24 MED ORDER — FLUCONAZOLE 150 MG PO TABS: 150.0000 mg | ORAL_TABLET | Freq: Once | ORAL | 0 refills | Status: AC

## 2019-11-24 NOTE — Telephone Encounter (Signed)
No show. Called pt phone number but unable to leave a message pertaining to her appointment secondary to her mailbox being full.

## 2019-11-25 LAB — URINE CULTURE
MICRO NUMBER:: 10776228
Result:: NO GROWTH
SPECIMEN QUALITY:: ADEQUATE

## 2019-11-25 LAB — WET PREP BY MOLECULAR PROBE
Candida species: DETECTED — AB
MICRO NUMBER:: 10776227
SPECIMEN QUALITY:: ADEQUATE
Trichomonas vaginosis: NOT DETECTED

## 2019-11-25 LAB — C. TRACHOMATIS/N. GONORRHOEAE RNA
C. trachomatis RNA, TMA: NOT DETECTED
N. gonorrhoeae RNA, TMA: NOT DETECTED

## 2019-11-26 ENCOUNTER — Ambulatory Visit: Payer: BC Managed Care – PPO

## 2019-12-01 ENCOUNTER — Ambulatory Visit: Payer: BC Managed Care – PPO

## 2019-12-02 ENCOUNTER — Ambulatory Visit
Admission: RE | Admit: 2019-12-02 | Discharge: 2019-12-02 | Disposition: A | Discharge status: Home / Self Care | Payer: BC Managed Care – PPO | Source: Ambulatory Visit | Attending: Family Medicine | Admitting: Family Medicine

## 2019-12-02 ENCOUNTER — Other Ambulatory Visit: Payer: Self-pay

## 2019-12-02 DIAGNOSIS — M25552 Pain in left hip: Secondary | ICD-10-CM

## 2019-12-02 DIAGNOSIS — M76892 Other specified enthesopathies of left lower limb, excluding foot: Secondary | ICD-10-CM

## 2019-12-03 ENCOUNTER — Ambulatory Visit: Payer: Self-pay

## 2019-12-03 NOTE — Progress Notes (Signed)
Radiology sees a little arthritis in the hip joint and evidence of labrum tear.  No severe tendon tear or severe tendinitis.  I see a little bit of tendinitis in the imaging.  Recommend return to clinic to discuss results and plan for next steps.

## 2019-12-08 ENCOUNTER — Ambulatory Visit: Payer: BC Managed Care – PPO | Admitting: Physical Therapy

## 2019-12-10 ENCOUNTER — Ambulatory Visit: Payer: BC Managed Care – PPO

## 2019-12-10 ENCOUNTER — Ambulatory Visit (INDEPENDENT_AMBULATORY_CARE_PROVIDER_SITE_OTHER): Payer: BC Managed Care – PPO

## 2019-12-10 ENCOUNTER — Encounter: Payer: Self-pay | Admitting: Family Medicine

## 2019-12-10 ENCOUNTER — Ambulatory Visit: Payer: BC Managed Care – PPO | Admitting: Family Medicine

## 2019-12-10 ENCOUNTER — Other Ambulatory Visit: Payer: Self-pay

## 2019-12-10 VITALS — BP 110/70 | HR 85 | Ht 67.0 in | Wt 171.0 lb

## 2019-12-10 DIAGNOSIS — M25552 Pain in left hip: Secondary | ICD-10-CM | POA: Diagnosis not present

## 2019-12-10 NOTE — Patient Instructions (Signed)
Thank you for coming in today. Call or go to the ER if you develop a large red swollen joint with extreme pain or oozing.  I am pretty sure that the pain you have been experiencing is coming from your hip joint.   The numbing medicine will wear off in a few hours. DO NOT OVER DO IT TODAY.   The cortisone will start working in a day or two.   If we get lucky it will last months.

## 2019-12-10 NOTE — Progress Notes (Signed)
Rito Ehrlich, am serving as a Education administrator for Dr. [name].  Gwendolyn [name] is a 49 y.o. female who presents to [place] [facility] today for follow up of L hip MRI. Patent was last seen by Dr. Georgina Snell on 11/03/2019 for L hip pain and stated had 4 PT sessions and states that she is feeling the same. PT seems to be helping, but she has not done any dance classes or anything to see if it will flare up. Since last visit patient states that she is having more pain and its now constant.  Pain is located in buttocks and lateral hip but has not improved sufficiently with physical therapy home exercise program and injection in greater trochanter and iliac crest.  Not much pain anterior hip.   Pertinent review of systems: No fevers or chills  Relevant historical information: History of plantar fasciitis   Exam:  BP 110/70 (BP Location: Left Arm, Patient Position: Sitting, Cuff Size: Normal)   Pulse 85   Ht 5\' 7"  (1.702 m)   Wt 171 lb (77.6 kg)   SpO2 98%   BMI 26.78 kg/m  General: Well Developed, well nourished, and in no acute distress.   MSK: Left hip normal-appearing normal motion. Slight decrease strength hip abduction 4+/5 Minimally tender palpation greater trochanter.    Lab and Radiology Results EXAM: MR OF THE LEFT HIP WITHOUT CONTRAST  TECHNIQUE: Multiplanar, multisequence MR imaging was performed. No intravenous contrast was administered.  COMPARISON:  Plain films left hip 10/06/2019.  FINDINGS: Bones: Marrow signal is normal without fracture, stress change or worrisome lesion. No avascular necrosis of the femoral heads. No subchondral cyst formation or edema about either hip.  Articular cartilage and labrum  Articular cartilage: Mildly degenerated with some fraying and irregularity. No focal or full-thickness defect.  Labrum: The superior labrum is degenerated with intrasubstance increased T2 signal compatible with  tear.  Joint or bursal effusion  Joint effusion:  None.  Bursae: Negative.  Muscles and tendons  Muscles and tendons:  Intact.  Other findings  Miscellaneous: Imaged intrapelvic contents demonstrate postoperative change of hysterectomy but are otherwise unremarkable.  IMPRESSION: The left superior labrum is degenerated and likely torn. MR arthrogram is more sensitive for the detection of labral tears.  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   Electronically Signed   By: Inge Rise M.D.   On: 12/03/2019 09:38 I, [name], personally (independently) visualized and performed the interpretation of the images attached in this note.   Procedure: Real-time Ultrasound Guided Injection of left hip femoral acetabular joint Device: Philips Affiniti 50G Images permanently stored and available for review in the ultrasound unit. Verbal informed consent obtained.  Discussed risks and benefits of procedure. Warned about infection bleeding damage to structures skin hypopigmentation and fat atrophy among others. Patient expresses understanding and agreement Time-out conducted.   Noted no overlying erythema, induration, or other signs of local infection.   Skin prepped in a sterile fashion.   Local anesthesia: Topical Ethyl chloride.   With sterile technique and under real time ultrasound guidance:  40 mg of Kenalog and 2 mL of Marcaine injected easily.   Completed without difficulty   Pain immediately resolved suggesting accurate placement of the medication.   Advised to call if fevers/chills, erythema, induration, drainage, or persistent bleeding.   Images permanently stored and available for review in the ultrasound unit.  Impression: Technically successful ultrasound guided injection.       Assessment  and Plan: 49 y.o. female with left hip pain difficulty fully characterize however patient had fantastic response to femoral acetabular injection.  I  believe the arthritis changes and labrum tear seen on recent hip MRI is the main cause of her hip pain.  Although her presentation of pain more lateral is not typical for intra-articular cause of pain or clear immediate response to injection in clinic today makes the cause of pain to be interarticular.  Plan for injection and and continued home exercise program.  Recheck back as needed.  Ultimately patient will likely require hip replacement.    Orders Placed This Encounter  Procedures  . [place] LIMITED JOINT SPACE STRUCTURES LOW LEFT    Standing Status:   Future    Number of Occurrences:   1    Standing Expiration Date:   12/09/2020    Order Specific Question:   Reason for Exam (SYMPTOM  OR DIAGNOSIS REQUIRED)    Answer:   Left hip injection    Order Specific Question:   Preferred imaging location?    Answer:   [place]   No orders of the defined types were placed in this encounter.    Discussed warning signs or symptoms. Please see discharge instructions. Patient expresses understanding.   The above documentation has been reviewed and is accurate and complete [name], M.D.

## 2019-12-17 ENCOUNTER — Other Ambulatory Visit: Payer: Self-pay | Admitting: Internal Medicine

## 2019-12-18 ENCOUNTER — Telehealth: Payer: Self-pay | Admitting: Internal Medicine

## 2020-01-06 ENCOUNTER — Other Ambulatory Visit: Payer: Self-pay | Admitting: Primary Care

## 2020-01-06 DIAGNOSIS — K649 Unspecified hemorrhoids: Secondary | ICD-10-CM

## 2020-01-07 NOTE — Telephone Encounter (Signed)
Have been given a few time in past but never from you. Ok to fill?

## 2020-01-17 ENCOUNTER — Other Ambulatory Visit: Payer: Self-pay | Admitting: Internal Medicine

## 2020-01-17 DIAGNOSIS — M797 Fibromyalgia: Secondary | ICD-10-CM

## 2020-01-27 ENCOUNTER — Other Ambulatory Visit: Payer: Self-pay | Admitting: Internal Medicine

## 2020-02-22 ENCOUNTER — Encounter: Payer: Self-pay | Admitting: Physical Therapy

## 2020-02-22 DIAGNOSIS — M6281 Muscle weakness (generalized): Secondary | ICD-10-CM

## 2020-02-22 DIAGNOSIS — M25552 Pain in left hip: Secondary | ICD-10-CM

## 2020-02-22 NOTE — Therapy (Signed)
[facility] [address], [place], [address] Phone: [phone]   Fax:  [phone]  Physical Therapy No-Visit Discharge Summary Reporting period: 10/13/2019 - 02/22/2020  Patient Details  Name: Gwendolyn [name] MRN: [id number] Date of Birth: [birth year] Referring Provider (PT): Gregor Hams, MD   Encounter Date: 02/22/2020    Past Medical History:  Diagnosis Date  . Anal fissure   . Anxiety   . Breast cancer ([place]) 2002   lumpectomy-Right  . Depression   . Family history of brain cancer   . Family history of colon cancer   . Fibromyalgia   . Gallstones   . Irritable bowel syndrome   . Personal history of radiation therapy   . Yeast vaginitis     Past Surgical History:  Procedure Laterality Date  . ABDOMINAL HYSTERECTOMY    . 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 BREAST BIOPSY    . BREAST LUMPECTOMY Right 2003   Radiatiomn Therapy DCIS  . CHOLECYSTECTOMY  2000  . COLECTOMY  10/31/2012   SECONDARY TO COLONIC INERTIA  . REDUCTION MAMMAPLASTY Bilateral 11/13/2018   right breast 79m DCIS found during breast reduction  . SUPRACERVICAL ABDOMINAL HYSTERECTOMY  04/10/2011   But still has ovaries.    There were no vitals filed for this visit.   Subjective Assessment - 02/22/20 1403    Subjective Patient stopped attending PT visits.    Pertinent History L hip pain. Pain began middle of May 2021, gradual onset. Saw MD who gave her a cortisone shot and exercises. Went to a belly dancing class and felt a pop. L leg buckled afterwards. Returned to her MD, got a cortisone shot and an ultrasound which revealed L hip bursitis in addition to her L hip tendinosis. Pt also has a small fracture in her R pinky toe which began [employer] Accidentally kicked her toiletry bag. R pinky toe fracture improving. L hip pain improving overall because pt states that she is not doing anything. Likes  to be active and not doing anything makes her miserable.  Prior to injury, pt was taking dance clases: belly dancing, latin fusion, [place], hula class. Pt also works out with a pPhysiological scientist 3 days a week, 30 minute session (a lot of arms and abs). Her MD gave her hip abductor stretch, TFL stretch which really bothers her.    Patient Stated Goals Get back into the gym, return to dancing without limitations.           OBJECTIVE Patient is not present for examination at this time. Please see previous documentation for latest objective data.     PT Short Term Goals - 10/22/19 1146      PT SHORT TERM GOAL #1   Title Patient will be independent with her HEP to decrease pain, improve ability to return to her normal workout routine.    Baseline Pt has started her HEP (10/13/2019)    Time 3    Period Weeks    Status Achieved    Target Date 11/05/19             PT Long Term Goals - 02/22/20 1403      PT LONG TERM GOAL #1   Title Patient will have a decrease in L hip pain to 2/10 or less at worst to promote ability to tolerate activities which involve hip adduction, as well as to promote ability to return to  her normal exercise routine.    Baseline 4/10 L hip pain at most for the past 2 weeks (10/13/2019)    Time 8    Period Weeks    Status Partially Met    Target Date 12/10/19      PT LONG TERM GOAL #2   Title Patient will improve L hip abduction and extension strength by at least 1/2 MMT grade to promote ability to perform standing tasks more comfortably.    Baseline L hip abduction 4-/5 with pain, hip extension 4-/5 (10/13/2019)    Time 8    Period Weeks    Status Unable to assess    Target Date 12/10/19      PT LONG TERM GOAL #3   Title Patient will improve her hip FOTO score by at least 10 points as a demonstration of improved function.    Baseline Hip FOTO 43 (10/13/2019)    Time 8    Period Weeks    Status Unable to assess    Target Date 12/10/19                  Plan - 02/22/20 1406    Clinical Impression Statement Patient attended 6 physical therapy sessions before no longer coming for appointments. Patient made good progress but had difficulty with transitioning from complete rest to gradual restoration of prior level of activity and kept flaring symptoms with vigorous activities and resting completely for it to feel better. Unable to continue working on progressing towards goals due to lack of participation and is now discharged for this reason.    Personal Factors and Comorbidities Comorbidity 3+    Comorbidities Hx of breast CA, fibromyalgia, hx of radiation therapy    Examination-Activity Limitations Sleep;Locomotion Level;Stairs    Stability/Clinical Decision Making Stable/Uncomplicated    Rehab Potential Fair    PT Frequency 2x / week    PT Duration 8 weeks    PT Treatment/Interventions Aquatic Therapy;Electrical Stimulation;Iontophoresis 39m/ml Dexamethasone;Therapeutic activities;Therapeutic exercise;Neuromuscular re-education;Patient/family education;Manual techniques;Dry needling    PT Next Visit Plan Patient is now discharged from PT    Consulted and Agree with Plan of Care Patient           Patient will benefit from skilled therapeutic intervention in order to improve the following deficits and impairments:     Visit Diagnosis: Pain in left hip  Muscle weakness (generalized)     Problem List Patient Active Problem List   Diagnosis Date Noted  . Vaginal discharge 11/23/2019  . 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 History of tobacco abuse 03/13/2019  . Encounter for tobacco use cessation counseling 03/13/2019  . Genetic testing 01/19/2019  . Family history of brain cancer   . Family history of colon cancer   . Recurrent UTI 12/08/2018  . 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 Hematuria 12/05/2017  . Rectal bleeding 09/02/2017  . Encounter for  preventive health examination 11/22/2016  . History of abdominal supracervical subtotal hysterectomy 11/22/2016  . Plantar fasciitis 11/10/2015  . Vaginitis 03/18/2015  . Menopause 01/18/2015  . Hemorrhoids 10/06/2014  . Hypercholesteremia 10/05/2014  . Vitamin D deficiency 10/05/2014  . Depressive disorder 10/05/2014  . History of breast cancer 10/05/2014  . Migraine without aura with status migrainosus 10/05/2014  . 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 Insomnia 10/05/2014  . Bipolar 2 disorder ([place] 10/05/2014  . IBS (irritable bowel syndrome) 10/05/2014  . Obstructive sleep apnea 10/05/2014  .  S/P colectomy 10/05/2014  . Hypersomnia 10/05/2014  . Anxiety 10/05/2014  . Fibromyalgia 10/05/2014  . Periodic limb movement disorder 10/05/2014    Everlean Alstrom. Graylon Good, PT, DPT 02/22/20, 2:07 PM  [facility] [address], [place], [address] Phone: [phone]   Fax:  [phone]  Name: Trent Theisen MRN: [id number] Date of Birth: 1971-01-09

## 2020-03-31 ENCOUNTER — Ambulatory Visit: Payer: BC Managed Care – PPO | Admitting: Family Medicine

## 2020-03-31 ENCOUNTER — Encounter: Payer: Self-pay | Admitting: Family Medicine

## 2020-03-31 ENCOUNTER — Other Ambulatory Visit: Payer: Self-pay

## 2020-03-31 ENCOUNTER — Ambulatory Visit (INDEPENDENT_AMBULATORY_CARE_PROVIDER_SITE_OTHER): Payer: BC Managed Care – PPO

## 2020-03-31 ENCOUNTER — Ambulatory Visit: Payer: Self-pay

## 2020-03-31 VITALS — BP 124/82 | HR 79 | Ht 67.0 in | Wt 175.4 lb

## 2020-03-31 DIAGNOSIS — M797 Fibromyalgia: Secondary | ICD-10-CM | POA: Diagnosis not present

## 2020-03-31 DIAGNOSIS — M25511 Pain in right shoulder: Secondary | ICD-10-CM

## 2020-03-31 DIAGNOSIS — M5412 Radiculopathy, cervical region: Secondary | ICD-10-CM | POA: Diagnosis not present

## 2020-03-31 MED ORDER — PREDNISONE 50 MG PO TABS: 50.0000 mg | ORAL_TABLET | Freq: Every day | ORAL | 0 refills | Status: DC

## 2020-03-31 MED ORDER — GABAPENTIN 800 MG PO TABS: 800.0000 mg | ORAL_TABLET | Freq: Three times a day (TID) | ORAL | 1 refills | Status: DC | PRN

## 2020-03-31 NOTE — Progress Notes (Signed)
I, [name], LAT, ATC, am serving as scribe for Dr. [name].  Gwendolyn [name] is a 49 y.o. female who presents to [place] [facility] today for R shoulder pain.  She was last seen by Dr. Georgina Snell on 12/10/19 for L hip pain and L hip MRI review.  Since then, she reports R shoulder pain that has been progressively worsening over the last few months.  She locates her pain to R superior angle of her scapula and R post shoulder that radiates into her R UE down to her forearm.  Of note patient did mention this to me during her visit with me in August.  This was a lesser issue.  Radiating pain: yes from R upper trap/superior scapular angle down into R forearm Neck pain: yes R shoulder mechanical symptoms: No Aggravating factors: sleeping; doing things on her phone; computer work; driving Treatments tried: ice, Biofreeze; massage   Pertinent review of systems: No fevers or chills  Relevant historical information: Sleep apnea   Exam:  BP 124/82 (BP Location: Left Arm, Patient Position: Sitting, Cuff Size: Normal)   Pulse 79   Ht 5\' 7"  (1.702 m)   Wt 175 lb 6.4 oz (79.6 kg)   SpO2 98%   BMI 27.47 kg/m  General: Well Developed, well nourished, and in no acute distress.   MSK: C-spine normal. Nontender midline. Tender palpation right trapezius. Normal cervical motion Negative Spurling's test. Upper extremity motion sensation and strength are intact distally bilateral extremities.  Reflexes are intact.  Right shoulder normal-appearing Normal motion. Normal strength. Tender palpation right trapezius. Negative Hawkins and Neer's test.     Lab and Radiology Results X-ray images C-spine obtained today personally and independently interpreted Loss of cervical lordosis.  No severe degenerative changes. Await formal radiology review   Assessment and Plan: 49 y.o. female with right arm pain thought to be cervical radiculopathy at see 6 dermatomal  pattern.  Plan for course of prednisone and increasing gabapentin dose.  If not improving would consider MRI to further characterize cause of pain and potential epidural steroid injection planning.  This has been ongoing since August with medical attention since August.   PDMP not reviewed this encounter. Orders Placed This Encounter  Procedures  . [place] LIMITED JOINT SPACE STRUCTURES UP RIGHT(NO LINKED CHARGES)    Order Specific Question:   Reason for Exam (SYMPTOM  OR DIAGNOSIS REQUIRED)    Answer:   R shoulder pain    Order Specific Question:   Preferred imaging location?    Answer:   [facility]  . DG Cervical Spine 2 or 3 views    Standing Status:   Future    Number of Occurrences:   1    Standing Expiration Date:   03/31/2021    Order Specific Question:   Reason for Exam (SYMPTOM  OR DIAGNOSIS REQUIRED)    Answer:   eval rt C6 rad    Order Specific Question:   Is patient pregnant?    Answer:   No    Order Specific Question:   Preferred imaging location?    Answer:   [facility]   Meds ordered this encounter  Medications  . predniSONE (DELTASONE) 50 MG tablet    Sig: Take 1 tablet (50 mg total) by mouth daily.    Dispense:  5 tablet    Refill:  0  . gabapentin (NEURONTIN) 800 MG tablet    Sig: Take 1 tablet (800 mg  total) by mouth 3 (three) times daily as needed (nerve pain).    Dispense:  180 tablet    Refill:  1     Discussed warning signs or symptoms. Please see discharge instructions. Patient expresses understanding.   The above documentation has been reviewed and is accurate and complete [name], M.D.

## 2020-03-31 NOTE — Patient Instructions (Signed)
Thank you for coming in today.  Take the prednisone during the day for 5 days.  Try the gabapentin during the day up to 3x daily.  I recommend starting at 400mg  in the morning and 400mg  at lunch and 800 at bedtime.   Please get an Xray today before you leave  If not improving let me know and I will arrange for MRI for injection planning.    Cervical Radiculopathy  Cervical radiculopathy means that a nerve in the neck (a cervical nerve) is pinched or bruised. This can happen because of an injury to the cervical spine (vertebrae) in the neck, or as a normal part of getting older. This can cause pain or loss of feeling (numbness) that runs from your neck all the way down to your arm and fingers. Often, this condition gets better with rest. Treatment may be needed if the condition does not get better. What are the causes?  A neck injury.  A bulging disk in your spine.  Muscle movements that you cannot control (muscle spasms).  Tight muscles in your neck due to overuse.  Arthritis.  Breakdown in the bones and joints of the spine (spondylosis) due to getting older.  Bone spurs that form near the nerves in the neck. What are the signs or symptoms?  Pain. The pain may: ? Run from the neck to the arm and hand. ? Be very bad or irritating. ? Be worse when you move your neck.  Loss of feeling or tingling in your arm or hand.  Weakness in your arm or hand, in very bad cases. How is this treated? In many cases, treatment is not needed for this condition. With rest, the condition often gets better over time. If treatment is needed, options may include:  Wearing a soft neck collar (cervical collar) for short periods of time, as told by your doctor.  Doing exercises (physical therapy) to strengthen your neck muscles.  Taking medicines.  Having shots (injections) in your spine, in very bad cases.  Having surgery. This may be needed if other treatments do not help. The type of surgery  that is used depends on the cause of your condition. Follow these instructions at home: If you have a soft neck collar:  Wear it as told by your doctor. Remove it only as told by your doctor.  Ask your doctor if you can remove the collar for cleaning and bathing. If you are allowed to remove the collar for cleaning or bathing: ? Follow instructions from your doctor about how to remove the collar safely. ? Clean the collar by wiping it with mild soap and water and drying it completely. ? Take out any removable pads in the collar every 1-2 days. Wash them by hand with soap and water. Let them air-dry completely before you put them back in the collar. ? Check your skin under the collar for redness or sores. If you see any, tell your doctor. Managing pain      Take over-the-counter and prescription medicines only as told by your doctor.  If told, put ice on the painful area. ? If you have a soft neck collar, remove it as told by your doctor. ? Put ice in a plastic bag. ? Place a towel between your skin and the bag. ? Leave the ice on for 20 minutes, 2-3 times a day.  If using ice does not help, you can try using heat. Use the heat source that your doctor recommends, such as  a moist heat pack or a heating pad. ? Place a towel between your skin and the heat source. ? Leave the heat on for 20-30 minutes. ? Remove the heat if your skin turns bright red. This is very important if you are unable to feel pain, heat, or cold. You may have a greater risk of getting burned.  You may try a gentle neck and shoulder rub (massage). Activity  Rest as needed.  Return to your normal activities as told by your doctor. Ask your doctor what activities are safe for you.  Do exercises as told by your doctor or physical therapist.  Do not lift anything that is heavier than 10 lb (4.5 kg) until your doctor tells you that it is safe. General instructions  Use a flat pillow when you sleep.  Do not drive  while wearing a soft neck collar. If you do not have a soft neck collar, ask your doctor if it is safe to drive while your neck heals.  Ask your doctor if the medicine prescribed to you requires you to avoid driving or using heavy machinery.  Do not use any products that contain nicotine or tobacco, such as cigarettes, e-cigarettes, and chewing tobacco. These can delay healing. If you need help quitting, ask your doctor.  Keep all follow-up visits as told by your doctor. This is important. Contact a doctor if:  Your condition does not get better with treatment. Get help right away if:  Your pain gets worse and is not helped with medicine.  You lose feeling or feel weak in your hand, arm, face, or leg.  You have a high fever.  You have a stiff neck.  You cannot control when you poop or pee (have incontinence).  You have trouble with walking, balance, or talking. Summary  Cervical radiculopathy means that a nerve in the neck is pinched or bruised.  A nerve can get pinched from a bulging disk, arthritis, an injury to the neck, or other causes.  Symptoms include pain, tingling, or loss of feeling that goes from the neck into the arm or hand.  Weakness in your arm or hand can happen in very bad cases.  Treatment may include resting, wearing a soft neck collar, and doing exercises. You might need to take medicines for pain. In very bad cases, shots or surgery may be needed. This information is not intended to replace advice given to you by your health care provider. Make sure you discuss any questions you have with your health care provider. Document Revised: 02/28/2018 Document Reviewed: 02/28/2018 Elsevier Patient Education  2020 [employer].

## 2020-04-02 ENCOUNTER — Encounter: Payer: Self-pay | Admitting: Family Medicine

## 2020-04-05 ENCOUNTER — Telehealth: Payer: Self-pay | Admitting: Internal Medicine

## 2020-04-05 NOTE — Telephone Encounter (Addendum)
Spoke to Dr. Derrel Nip and received verbal instruction to Cancel an order of Gabapentin. Called and spoke to [place] at [facility]. She confirmed that Dr. Derrel Nip sent in a prescription of Gabapentin 800mg   for 90 days with instructions to take 1 per night in September that was not picked up. Dr. Georgina Snell sent in a prescription of Gabapentin 800mg  for 60 days with 180 to take 3 times a day as needed on 03/31/20. Cancelled Dr Demetrios Isaacs prescribed medication from September.

## 2020-04-05 NOTE — Telephone Encounter (Signed)
[facility], [phone] called. Patient was prescribed gabapentin (NEURONTIN) 800 MG tablet by Dr. Landry Mellow. Patient is wanting to pick up both gabapentin (NEURONTIN) 800 MG tablet prescribed by Dr. Derrel Nip and Dr. Georgina Snell. [facility] needs to know if Dr. Derrel Nip wants both prescriptions filled?

## 2020-04-18 ENCOUNTER — Encounter: Payer: Self-pay | Admitting: Family Medicine

## 2020-04-18 DIAGNOSIS — M5412 Radiculopathy, cervical region: Secondary | ICD-10-CM

## 2020-04-18 NOTE — Progress Notes (Deleted)
[place]   [phone] [phone]   Clinic Follow up Note   Patient Care Team: Gwendolyn Mc, MD as PCP - General (Internal Medicine) Gwendolyn Overall, MD as Consulting Physician (General Surgery) 04/18/2020  CHIEF COMPLAINT: Follow up right breast DCIS   SUMMARY OF ONCOLOGIC HISTORY: Oncology History Overview Note  Cancer Staging No matching staging information was found for the patient.    DCIS (ductal carcinoma in situ)  11/06/2018 Mammogram   Mammogram 11/06/18  IMPRESSION: No mammographic evidence of malignancy. A result letter of this screening mammogram will be mailed directly to the patient.   11/13/2018 Surgery   B/l Breast Reduction   11/13/2018 Pathology Results    Diagnosis 11/13/18 1. Breast, Mammoplasty, right tissue, 76g - DUCTAL CARCINOMA IN SITU. 0.4cm 2. Breast, Mammoplasty, left tissue, 212g - BENIGN BREAST TISSUE.  Estrogen Receptor: 95%, POSITIVE, STRONG STAINING INTENSITY Progesterone Receptor: 95%, POSITIVE, STRONG STAINING INTENSITY   12/06/2018 Initial Diagnosis   Ductal carcinoma in situ (DCIS) of right breast   01/16/2019 Genetic Testing   Negative genetic testing on the common hereditary cancer panel.  The Common Hereditary Gene Panel offered by [facility] includes sequencing and/or deletion duplication testing of the following 48 genes: APC, ATM, AXIN2, BARD1, BMPR1A, BRCA1, BRCA2, BRIP1, CDH1, CDK4, CDKN2A (p14ARF), CDKN2A (p16INK4a), CHEK2, CTNNA1, DICER1, EPCAM (Deletion/duplication testing only), GREM1 (promoter region deletion/duplication testing only), KIT, MEN1, MLH1, MSH2, MSH3, MSH6, MUTYH, NBN, NF1, NHTL1, PALB2, PDGFRA, PMS2, POLD1, POLE, PTEN, RAD50, RAD51C, RAD51D, RNF43, SDHB, SDHC, SDHD, SMAD4, SMARCA4. STK11, TP53, TSC1, TSC2, and VHL.  The following genes were evaluated for sequence changes only: SDHA and HOXB13 c.251G>A variant only. The report date is January 16, 2019.   02/24/2019 Breast MRI    IMPRESSION: 1. Indeterminate 6 mm enhancing mass in the upper outer left breast. Recommendation is for diagnostic evaluation with mammography and ultrasound. If there is no mammographic or sonographic correlate, recommendation is to proceed with MRI guided biopsy. 2. No suspicious MRI findings on the right.   03/02/2019 Breast US   Bilateral mammo/L br US IMPRESSION: Expected bilateral post reduction surgical scarring as described. No focal abnormality over the upper outer left breast to correlate to the recent MRI finding.   03/12/2019 Breast MRI   MRI Guided biopsy attempt IMPRESSION: The small mass in the upper-outer quadrant of the left breast which was the target of the intended MRI guided biopsy today is obscured by marked increase in background parenchymal enhancement. Therefore the biopsy was canceled, and will need to be rescheduled.   03/24/2019 Pathology Results   Diagnosis Breast, left, needle core biopsy, upper outer - FIBROCYSTIC CHANGES. - PRIOR PROCEDURE SITE CHANGES.   11/10/2019 Breast MRI   IMPRESSION: No MR evidence of breast malignancy. RECOMMENDATION: Bilateral diagnostic mammogram in 6 months to resume annual mammogram schedule in this patient with history of RIGHT DCIS in 2020     CURRENT THERAPY:  Close surveillance, alternating every 6 month mammogram and breast MRI  INTERVAL HISTORY: Gwendolyn [name] returns for follow up as scheduled. She was last seen 11/18/19.    REVIEW OF SYSTEMS:   Constitutional: Denies fevers, chills or abnormal weight loss Eyes: Denies blurriness of vision Ears, nose, mouth, throat, and face: Denies mucositis or sore throat Respiratory: Denies cough, dyspnea or wheezes Cardiovascular: Denies palpitation, chest discomfort or lower extremity swelling Gastrointestinal:  Denies nausea, heartburn or change in bowel habits Skin: Denies abnormal skin rashes Lymphatics: Denies new lymphadenopathy or easy  bruising  Neurological:Denies numbness, tingling or new weaknesses Behavioral/Psych: Mood is stable, no new changes  All other systems were reviewed with the patient and are negative.  MEDICAL HISTORY:  Past Medical History:  Diagnosis Date  . Anal fissure   . Anxiety   . Breast cancer ([place]) 2002   lumpectomy-Right  . Depression   . Family history of brain cancer   . Family history of colon cancer   . Fibromyalgia   . Gallstones   . Irritable bowel syndrome   . Personal history of radiation therapy   . Yeast vaginitis     SURGICAL HISTORY: Past Surgical History:  Procedure Laterality Date  . ABDOMINAL HYSTERECTOMY    . 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 BREAST BIOPSY    . BREAST LUMPECTOMY Right 2003   Radiatiomn Therapy DCIS  . CHOLECYSTECTOMY  2000  . COLECTOMY  10/31/2012   SECONDARY TO COLONIC INERTIA  . REDUCTION MAMMAPLASTY Bilateral 11/13/2018   right breast 81mm DCIS found during breast reduction  . SUPRACERVICAL ABDOMINAL HYSTERECTOMY  04/10/2011   But still has ovaries.    I have reviewed the social history and family history with the patient and they are unchanged from previous note.  ALLERGIES:  is allergic to amoxicillin-pot clavulanate, morphine, and penicillins.  MEDICATIONS:  Current Outpatient Medications  Medication Sig Dispense Refill  . amphetamine-dextroamphetamine (ADDERALL) 30 MG tablet Take 30 mg by mouth 2 (two) times daily.   0  . clotrimazole-betamethasone (LOTRISONE) cream Apply 1 application topically 2 (two) times daily. 30 g 0  . DULoxetine HCl 40 MG CPEP Take 2 capsules by mouth every morning.    . gabapentin (NEURONTIN) 800 MG tablet Take 1 tablet (800 mg total) by mouth 3 (three) times daily as needed (nerve pain). 180 tablet 1  . Ginkgo Biloba 40 MG TABS Take by mouth.    . hydrocortisone (ANUSOL-HC) 25 MG suppository PLACE 1 SUPPOSITORY (25 MG TOTAL) RECTALLY 2 (TWO) TIMES DAILY AS NEEDED FOR HEMORRHOIDS OR ANAL  ITCHING. 12 suppository 0  . Melatonin 3 MG TABS Take 1 tablet by mouth at bedtime.    . meloxicam (MOBIC) 15 MG tablet TAKE 1 TABLET BY MOUTH EVERY DAY 30 tablet 2  . metFORMIN (GLUCOPHAGE-XR) 500 MG 24 hr tablet TAKE 1 TABLET BY MOUTH EVERY DAY WITH BREAKFAST 90 tablet 1  . metroNIDAZOLE (METROGEL VAGINAL) 0.75 % vaginal gel Place 1 Applicatorful vaginally 2 (two) times daily. 70 g 0  . predniSONE (DELTASONE) 50 MG tablet Take 1 tablet (50 mg total) by mouth daily. 5 tablet 0  . sertraline (ZOLOFT) 25 MG tablet Take 25 mg by mouth daily.    . tiaGABine (GABITRIL) 4 MG tablet Takes $RemoveBefo'4mg'iBIXeXrXuxv$  in the AM and $Remo'12mg'jUEHp$  at bedtime    . trimethoprim (TRIMPEX) 100 MG tablet Take 1 tablet (100 mg total) by mouth as needed (post-coital prophylaxis to prevent UTIs). 30 tablet 3  . varenicline (CHANTIX) 1 MG tablet Take 1 tablet (1 mg total) by mouth 2 (two) times daily. 60 tablet 5  . zolpidem (AMBIEN) 10 MG tablet Take 1 tablet (10 mg total) by mouth at bedtime as needed for sleep. 30 tablet 5   No current facility-administered medications for this visit.    PHYSICAL EXAMINATION: ECOG PERFORMANCE STATUS: {CHL ONC ECOG PS:[phone]}  There were no vitals filed for this visit. There were no vitals filed for this visit.  GENERAL:alert, no distress and comfortable SKIN: skin color, texture, turgor are normal,  no rashes or significant lesions EYES: normal, Conjunctiva are pink and non-injected, sclera clear OROPHARYNX:no exudate, no erythema and lips, buccal mucosa, and tongue normal  NECK: supple, thyroid normal size, non-tender, without nodularity LYMPH:  no palpable lymphadenopathy in the cervical, axillary or inguinal LUNGS: clear to auscultation and percussion with normal breathing effort HEART: regular rate & rhythm and no murmurs and no lower extremity edema ABDOMEN:abdomen soft, non-tender and normal bowel sounds Musculoskeletal:no cyanosis of digits and no clubbing  NEURO: alert & oriented x 3 with  fluent speech, no focal motor/sensory deficits  LABORATORY DATA:  I have reviewed the data as listed CBC Latest Ref Rng & Units 10/21/2019 03/29/2018 03/28/2018  WBC 3.8 - 10.8 Thousand/uL 7.3 7.7 10.5  Hemoglobin 11.7 - 15.5 g/dL 13.5 12.5 13.3  Hematocrit 35.0 - 45.0 % 41.0 40.1 41.7  Platelets 140 - 400 Thousand/uL 250 267 289     CMP Latest Ref Rng & Units 10/21/2019 06/05/2018 03/26/2018  Glucose 65 - 99 mg/dL 94 96 100(H)  BUN 7 - 25 mg/dL $Remove'11 11 15  'eHaqHxG$ Creatinine 0.50 - 1.05 mg/dL 0.94 0.81 0.87  Sodium 135 - 146 mmol/L 140 136 141  Potassium 3.5 - 5.3 mmol/L 4.1 4.2 4.0  Chloride 98 - 110 mmol/L 104 103 105  CO2 20 - 32 mmol/L $RemoveB'29 23 27  'LrNPyXRU$ Calcium 8.6 - 10.4 mg/dL 9.0 9.4 8.8(L)  Total Protein 6.1 - 8.1 g/dL 6.1 7.3 6.8  Total Bilirubin 0.2 - 1.2 mg/dL 0.6 0.6 0.4  Alkaline Phos 39 - 117 U/L - 29(L) 31(L)  AST 10 - 35 U/L $Remo'14 17 19  'luebl$ ALT 6 - 29 U/L $Remo'11 13 14      'fDxjy$ RADIOGRAPHIC STUDIES: I have personally reviewed the radiological images as listed and agreed with the findings in the report. No results found.   ASSESSMENT & PLAN:  No problem-specific Assessment & Plan notes found for this encounter.   No orders of the defined types were placed in this encounter.  All questions were answered. The patient knows to call the clinic with any problems, questions or concerns. No barriers to learning was detected. I spent {CHL ONC TIME VISIT - ZRAQT:[id number]} counseling the patient face to face. The total time spent in the appointment was {CHL ONC TIME VISIT - GYBWL:[id number]} and more than 50% was on counseling and review of test results     Gwendolyn Feeling, NP 04/18/20

## 2020-04-19 ENCOUNTER — Ambulatory Visit: Payer: BC Managed Care – PPO | Admitting: Nurse Practitioner

## 2020-04-19 MED ORDER — PREDNISONE 50 MG PO TABS
50.0000 mg | ORAL_TABLET | Freq: Every day | ORAL | 0 refills | Status: DC
Start: 2020-04-19 — End: 2020-04-27

## 2020-04-24 ENCOUNTER — Ambulatory Visit (INDEPENDENT_AMBULATORY_CARE_PROVIDER_SITE_OTHER): Payer: BC Managed Care – PPO

## 2020-04-24 ENCOUNTER — Other Ambulatory Visit: Payer: Self-pay

## 2020-04-24 DIAGNOSIS — M5021 Other cervical disc displacement,  high cervical region: Secondary | ICD-10-CM | POA: Diagnosis not present

## 2020-04-24 DIAGNOSIS — M50221 Other cervical disc displacement at C4-C5 level: Secondary | ICD-10-CM

## 2020-04-24 DIAGNOSIS — M50223 Other cervical disc displacement at C6-C7 level: Secondary | ICD-10-CM

## 2020-04-24 DIAGNOSIS — M5412 Radiculopathy, cervical region: Secondary | ICD-10-CM

## 2020-04-24 DIAGNOSIS — M50222 Other cervical disc displacement at C5-C6 level: Secondary | ICD-10-CM | POA: Diagnosis not present

## 2020-04-25 ENCOUNTER — Other Ambulatory Visit: Payer: Self-pay

## 2020-04-25 ENCOUNTER — Emergency Department
Admission: EM | Admit: 2020-04-25 | Discharge: 2020-04-25 | Disposition: A | Discharge status: Home / Self Care | Payer: BC Managed Care – PPO | Attending: Emergency Medicine | Admitting: Emergency Medicine

## 2020-04-25 ENCOUNTER — Encounter: Payer: Self-pay | Admitting: Family Medicine

## 2020-04-25 ENCOUNTER — Emergency Department: Payer: BC Managed Care – PPO

## 2020-04-25 ENCOUNTER — Telehealth: Payer: Self-pay | Admitting: Nurse Practitioner

## 2020-04-25 ENCOUNTER — Encounter: Payer: Self-pay | Admitting: *Deleted

## 2020-04-25 DIAGNOSIS — Z853 Personal history of malignant neoplasm of breast: Secondary | ICD-10-CM | POA: Insufficient documentation

## 2020-04-25 DIAGNOSIS — R1084 Generalized abdominal pain: Secondary | ICD-10-CM

## 2020-04-25 DIAGNOSIS — I880 Nonspecific mesenteric lymphadenitis: Secondary | ICD-10-CM | POA: Diagnosis not present

## 2020-04-25 DIAGNOSIS — Z79899 Other long term (current) drug therapy: Secondary | ICD-10-CM | POA: Insufficient documentation

## 2020-04-25 DIAGNOSIS — Z87891 Personal history of nicotine dependence: Secondary | ICD-10-CM | POA: Insufficient documentation

## 2020-04-25 LAB — CBC
HCT: 40.7 % (ref 36.0–46.0)
Hemoglobin: 13.6 g/dL (ref 12.0–15.0)
MCH: 31.5 pg (ref 26.0–34.0)
MCHC: 33.4 g/dL (ref 30.0–36.0)
MCV: 94.2 fL (ref 80.0–100.0)
Platelets: 351 10*3/uL (ref 150–400)
RBC: 4.32 MIL/uL (ref 3.87–5.11)
RDW: 12.8 % (ref 11.5–15.5)
WBC: 11.8 10*3/uL — ABNORMAL HIGH (ref 4.0–10.5)
nRBC: 0 % (ref 0.0–0.2)

## 2020-04-25 LAB — COMPREHENSIVE METABOLIC PANEL
ALT: 25 U/L (ref 0–44)
AST: 17 U/L (ref 15–41)
Albumin: 3.6 g/dL (ref 3.5–5.0)
Alkaline Phosphatase: 32 U/L — ABNORMAL LOW (ref 38–126)
Anion gap: 10 (ref 5–15)
BUN: 12 mg/dL (ref 6–20)
CO2: 29 mmol/L (ref 22–32)
Calcium: 8.6 mg/dL — ABNORMAL LOW (ref 8.9–10.3)
Chloride: 100 mmol/L (ref 98–111)
Creatinine, Ser: 0.73 mg/dL (ref 0.44–1.00)
GFR, Estimated: >60 mL/min (ref >60)
Glucose, Bld: 105 mg/dL — ABNORMAL HIGH (ref 70–99)
Potassium: 3.9 mmol/L (ref 3.5–5.1)
Sodium: 139 mmol/L (ref 135–145)
Total Bilirubin: 0.7 mg/dL (ref 0.3–1.2)
Total Protein: 6.7 g/dL (ref 6.5–8.1)

## 2020-04-25 LAB — LIPASE, BLOOD: Lipase: 47 U/L (ref 11–51)

## 2020-04-25 MED ORDER — IOHEXOL 300 MG/ML  SOLN
100.0000 mL | Freq: Once | INTRAMUSCULAR | Status: AC | PRN
  Administered 2020-04-25: 100 mL via INTRAVENOUS
  Filled 2020-04-25: qty 100

## 2020-04-25 MED ORDER — FENTANYL CITRATE (PF) 100 MCG/2ML IJ SOLN
50.0000 ug | Freq: Once | INTRAMUSCULAR | Status: AC
  Administered 2020-04-25: 50 ug via INTRAVENOUS
  Filled 2020-04-25: qty 2

## 2020-04-25 MED ORDER — OXYCODONE-ACETAMINOPHEN 5-325 MG PO TABS: 1.0000 | ORAL_TABLET | Freq: Four times a day (QID) | ORAL | 0 refills | Status: DC | PRN

## 2020-04-25 MED ORDER — ONDANSETRON HCL 4 MG/2ML IJ SOLN
4.0000 mg | Freq: Once | INTRAMUSCULAR | Status: AC
  Administered 2020-04-25: 4 mg via INTRAVENOUS
  Filled 2020-04-25: qty 2

## 2020-04-25 MED ORDER — OXYCODONE-ACETAMINOPHEN 5-325 MG PO TABS
1.0000 | ORAL_TABLET | Freq: Once | ORAL | Status: AC
  Administered 2020-04-25: 1 via ORAL
  Filled 2020-04-25: qty 1

## 2020-04-25 MED ORDER — ONDANSETRON 4 MG PO TBDP: 4.0000 mg | ORAL_TABLET | Freq: Three times a day (TID) | ORAL | 0 refills | Status: DC | PRN

## 2020-04-25 MED ORDER — KETOROLAC TROMETHAMINE 30 MG/ML IJ SOLN
15.0000 mg | INTRAMUSCULAR | Status: AC
  Administered 2020-04-25: 15 mg via INTRAVENOUS
  Filled 2020-04-25: qty 1

## 2020-04-25 MED ORDER — SODIUM CHLORIDE 0.9 % IV BOLUS
1000.0000 mL | Freq: Once | INTRAVENOUS | Status: AC
  Administered 2020-04-25: 1000 mL via INTRAVENOUS

## 2020-04-25 NOTE — Telephone Encounter (Signed)
Rescheduled appointment per 1/1 schedule message. Patient is aware of changes.

## 2020-04-25 NOTE — ED Provider Notes (Signed)
[facility] Emergency Department Provider Note  ____________________________________________  Time seen: Approximately 6:24 PM  I have reviewed the triage vital signs and the nursing notes.   HISTORY  Chief Complaint Abdominal Pain    HPI Gwendolyn [name] is a 50 y.o. female with a history of breast cancer, IBS , bipolar disorder who comes ED complaining of generalized abdominal pain, worse in epigastrium, some radiation to the back.  Worse with movement, no alleviating factors, severe constant waxing and waning.  Reports she was in her usual state of health until about a week ago when she had a viral illness with watery diarrhea, body aches, fatigue.  Her husband had similar symptoms.  She took a Covid test which was negative at that time.  Diarrhea is improving, but pain today has been severe and unlike any pain she has had in the past.  Has a history of colon resection with anastomosis of the small intestine and rectum.     Past Medical History:  Diagnosis Date  . Anal fissure   . Anxiety   . Breast cancer ([facility]) 2002   lumpectomy-Right  . Depression   . Family history of brain cancer   . Family history of colon cancer   . Fibromyalgia   . Gallstones   . Irritable bowel syndrome   . Personal history of radiation therapy   . Yeast vaginitis      Patient Active Problem List   Diagnosis Date Noted  . Vaginal discharge 11/23/2019  . 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 History of tobacco abuse 03/13/2019  . Encounter for tobacco use cessation counseling 03/13/2019  . Genetic testing 01/19/2019  . Family history of brain cancer   . Family history of colon cancer   . Recurrent UTI 12/08/2018  . 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 Hematuria 12/05/2017  . Rectal bleeding 09/02/2017  . Encounter for preventive health examination 11/22/2016  . History of abdominal supracervical  subtotal hysterectomy 11/22/2016  . Plantar fasciitis 11/10/2015  . Vaginitis 03/18/2015  . Menopause 01/18/2015  . Hemorrhoids 10/06/2014  . Hypercholesteremia 10/05/2014  . Vitamin D deficiency 10/05/2014  . Depressive disorder 10/05/2014  . History of breast cancer 10/05/2014  . Migraine without aura with status migrainosus 10/05/2014  . 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 Insomnia 10/05/2014  . Bipolar 2 disorder ([facility]) 10/05/2014  . IBS (irritable bowel syndrome) 10/05/2014  . Obstructive sleep apnea 10/05/2014  . S/P colectomy 10/05/2014  . Hypersomnia 10/05/2014  . Anxiety 10/05/2014  . Fibromyalgia 10/05/2014  . Periodic limb movement disorder 10/05/2014     Past Surgical History:  Procedure Laterality Date  . ABDOMINAL HYSTERECTOMY    . 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 BREAST BIOPSY    . BREAST LUMPECTOMY Right 2003   Radiatiomn Therapy DCIS  . CHOLECYSTECTOMY  2000  . COLECTOMY  10/31/2012   SECONDARY TO COLONIC INERTIA  . REDUCTION MAMMAPLASTY Bilateral 11/13/2018   right breast 32mm DCIS found during breast reduction  . SUPRACERVICAL ABDOMINAL HYSTERECTOMY  04/10/2011   But still has ovaries.     Prior to Admission medications   Medication Sig Start Date End Date Taking? Authorizing Provider  ondansetron (ZOFRAN ODT) 4 MG disintegrating tablet Take 1 tablet (4 mg total) by mouth every 8 (eight) hours as needed for nausea or vomiting. 04/25/20  Yes Sharman Cheek, MD  oxyCODONE-acetaminophen (PERCOCET) 5-325 MG tablet  Take 1 tablet by mouth every 6 (six) hours as needed for severe pain. 04/25/20 04/25/21 Yes Carrie Mew, MD  amphetamine-dextroamphetamine (ADDERALL) 30 MG tablet Take 30 mg by mouth 2 (two) times daily.  11/02/16   [provider]  clotrimazole-betamethasone (LOTRISONE) cream Apply 1 application topically 2 (two) times daily. 10/19/19   [name], MD  DULoxetine HCl 40 MG CPEP Take  2 capsules by mouth every morning. 03/30/19   [provider]  gabapentin (NEURONTIN) 800 MG tablet Take 1 tablet (800 mg total) by mouth 3 (three) times daily as needed (nerve pain). 03/31/20   Gregor Hams, MD  Ginkgo Biloba 40 MG TABS Take by mouth.    [provider]  hydrocortisone (ANUSOL-HC) 25 MG suppository PLACE 1 SUPPOSITORY (25 MG TOTAL) RECTALLY 2 (TWO) TIMES DAILY AS NEEDED FOR HEMORRHOIDS OR ANAL ITCHING. 01/08/20   [name], MD  Melatonin 3 MG TABS Take 1 tablet by mouth at bedtime.    [provider]  meloxicam (MOBIC) 15 MG tablet TAKE 1 TABLET BY MOUTH EVERY DAY 01/27/20   [name], MD  metFORMIN (GLUCOPHAGE-XR) 500 MG 24 hr tablet TAKE 1 TABLET BY MOUTH EVERY DAY WITH BREAKFAST 12/17/19   [name], MD  metroNIDAZOLE (METROGEL VAGINAL) 0.75 % vaginal gel Place 1 Applicatorful vaginally 2 (two) times daily. 11/24/19   Pleas Koch, NP  predniSONE (DELTASONE) 50 MG tablet Take 1 tablet (50 mg total) by mouth daily. 04/19/20   Gregor Hams, MD  sertraline (ZOLOFT) 25 MG tablet Take 25 mg by mouth daily. 10/12/19   [provider]  tiaGABine (GABITRIL) 4 MG tablet Takes 4mg  in the AM and 12mg  at bedtime    [provider]  trimethoprim (TRIMPEX) 100 MG tablet Take 1 tablet (100 mg total) by mouth as needed (post-coital prophylaxis to prevent UTIs). 05/19/19   Billey Co, MD  varenicline (CHANTIX) 1 MG tablet Take 1 tablet (1 mg total) by mouth 2 (two) times daily. 06/19/19   [name], MD  zolpidem (AMBIEN) 10 MG tablet Take 1 tablet (10 mg total) by mouth at bedtime as needed for sleep. 12/02/17   [name], MD     Allergies Amoxicillin-pot clavulanate, Morphine, and Penicillins   Family History  Problem Relation Age of Onset  . Meniere's disease Father   . Alcohol abuse Sister   . Heart disease Maternal Grandmother   . Heart disease Maternal Grandfather   . Brain cancer Maternal Grandfather    . Heart disease Paternal Grandmother   . Heart disease Paternal Grandfather   . Rectal cancer Other        MGF's mother  . Colon cancer Neg Hx   . Esophageal cancer Neg Hx   . Breast cancer Neg Hx     Social History Social History   Tobacco Use  . Smoking status: Former Smoker    Packs/day: 1.00    Years: 20.00    Pack years: 20.00    Quit date: 04/23/2002    Years since quitting: 18.0  . Smokeless tobacco: Never Used  . Tobacco comment: QUIT IN 2004  Vaping Use  . Vaping Use: Never used  Substance Use Topics  . Alcohol use: Yes    Alcohol/week: 0.0 standard drinks    Comment: OCCASIONALLY  . Drug use: No    Review of Systems  Constitutional:   No fever or chills.  ENT:   No sore throat.  No rhinorrhea. Cardiovascular:   No chest pain or syncope. Respiratory:   No dyspnea or cough. Gastrointestinal: Positive as above for abdominal pain.  No vomiting. Musculoskeletal:   Negative for focal pain or swelling All other systems reviewed and are negative except as documented above in ROS and HPI.  ____________________________________________   PHYSICAL EXAM:  VITAL SIGNS: ED Triage Vitals [04/25/20 1749]  Enc Vitals Group     BP 122/61     Pulse Rate 93     Resp 16     Temp 98.4 F (36.9 C)     Temp Source Oral     SpO2 99 %     Weight 175 lb 7.8 oz (79.6 kg)     Height 5\' 7"  (1.702 m)     Head Circumference      Peak Flow      Pain Score 5     Pain Loc      Pain Edu?      Excl. in [place]?     Vital signs reviewed, nursing assessments reviewed.   Constitutional:   Alert and oriented. Non-toxic appearance. Eyes:   Conjunctivae are normal. EOMI. PERRL. ENT      Head:   Normocephalic and atraumatic.      Nose:   Wearing a mask.      Mouth/Throat:   Wearing a mask.      Neck:   No meningismus. Full ROM. Hematological/Lymphatic/Immunilogical:   No cervical lymphadenopathy. Cardiovascular:   RRR. Symmetric bilateral radial and DP pulses.  No murmurs. Cap  refill less than 2 seconds. Respiratory:   Normal respiratory effort without tachypnea/retractions. Breath sounds are clear and equal bilaterally. No wheezes/rales/rhonchi. Gastrointestinal:   Soft with generalized tenderness, exquisite in the epigastrium.. Non distended. There is no CVA tenderness.  No rebound, rigidity, or guarding.  Musculoskeletal:   Normal range of motion in all extremities. No joint effusions.  No lower extremity tenderness.  No edema. Neurologic:   Normal speech and language.  Motor grossly intact. No acute focal neurologic deficits are appreciated.  Skin:    Skin is warm, dry and intact. No rash noted.  No petechiae, purpura, or bullae.  ____________________________________________    LABS (pertinent positives/negatives) (all labs ordered are listed, but only abnormal results are displayed) Labs Reviewed  COMPREHENSIVE METABOLIC PANEL - Abnormal; Notable for the following components:      Result Value   Glucose, Bld 105 (*)    Calcium 8.6 (*)    Alkaline Phosphatase 32 (*)    All other components within normal limits  CBC - Abnormal; Notable for the following components:   WBC 11.8 (*)    All other components within normal limits  LIPASE, BLOOD  URINALYSIS, COMPLETE (UACMP) WITH MICROSCOPIC   ____________________________________________   EKG    ____________________________________________    RADIOLOGY  CT ABDOMEN PELVIS W CONTRAST  Result Date: 04/25/2020 CLINICAL DATA:  Pt to ED reporting generalized stabbing abd pain "all over" starting today. Pt and her husband had diarrhea last week without pain. Pt had a negative COVID test last week and denies having fevers. EXAM: CT ABDOMEN AND PELVIS WITH CONTRAST TECHNIQUE: Multidetector CT imaging of the abdomen and pelvis was performed using the standard protocol following bolus administration of intravenous contrast. CONTRAST:  174mL OMNIPAQUE IOHEXOL 300 MG/ML  SOLN COMPARISON:  CT abdomen pelvis  01/24/2018 FINDINGS: Lower chest: Bibasilar subsegmental atelectasis. Circumferential distal esophageal wall thickening. Possible tiny hiatal hernia. Hepatobiliary: Subcentimeter hypodensities are too small to  characterize. No focal liver abnormality. Status post cholecystectomy. No biliary dilatation. Pancreas: No focal lesion. Normal pancreatic contour. No surrounding inflammatory changes. No main pancreatic ductal dilatation. Spleen: Normal in size without focal abnormality. Adrenals/Urinary Tract: No adrenal nodule bilaterally. Bilateral kidneys enhance symmetrically. No hydronephrosis. No hydroureter. The urinary bladder is unremarkable. Stomach/Bowel: Partial colectomy. Stomach is within normal limits. No evidence of bowel wall thickening or dilatation. Stool within the distal colon and rectum. Vascular/Lymphatic: No abdominal aorta or iliac aneurysm. Slightly more prominent but nonenlarged mesenteric lymph nodes within the right mid abdomen. No abdominal, pelvic, or inguinal lymphadenopathy. Reproductive: Status post hysterectomy. A 2.6 cm fluid density lesion within the right adnexa that could represent fluid within the small bowel versus adnexal cyst. Otherwise no adnexal mass. Other: Slightly more hazy mesentery (2:49). No intraperitoneal free fluid. No intraperitoneal free gas. No organized fluid collection. Musculoskeletal: No abdominal wall hernia or abnormality. No suspicious lytic or blastic osseous lesions. No acute displaced fracture. Mild degenerative changes of the spine. IMPRESSION: 1. Circumferential distal esophageal wall thickening. Correlate with signs and symptoms of esophagitis. Underlying mass cannot be fully excluded. Possible associated tiny hiatal hernia. 2. More hazy mesentery and slightly more prominent but nonenlarged mesenteric lymph nodes within the right mid abdomen. Recommend attention on follow-up. 3. Partial colectomy with stool noted within the rectum. Electronically Signed    By: Iven Finn M.D.   On: 04/25/2020 20:19    ____________________________________________   PROCEDURES Procedures  ____________________________________________  DIFFERENTIAL DIAGNOSIS   Pancreatitis, internal hernia, bowel obstruction, appendicitis, diverticulitis.  Doubt AAA or dissection  CLINICAL IMPRESSION / ASSESSMENT AND PLAN / ED COURSE  Medications ordered in the ED: Medications  oxyCODONE-acetaminophen (PERCOCET/ROXICET) 5-325 MG per tablet 1 tablet (has no administration in time range)  sodium chloride 0.9 % bolus 1,000 mL (1,000 mLs Intravenous New Bag/Given 04/25/20 1850)  ondansetron (ZOFRAN) injection 4 mg (4 mg Intravenous Given 04/25/20 1852)  fentaNYL (SUBLIMAZE) injection 50 mcg (50 mcg Intravenous Given 04/25/20 1855)  ketorolac (TORADOL) 30 MG/ML injection 15 mg (15 mg Intravenous Given 04/25/20 1853)  iohexol (OMNIPAQUE) 300 MG/ML solution 100 mL (100 mLs Intravenous Contrast Given 04/25/20 1946)    Pertinent labs & imaging results that were available during my care of the patient were reviewed by me and considered in my medical decision making (see chart for details).  Deyona Weins was evaluated in Emergency Department on 04/25/2020 for the symptoms described in the history of present illness. She was evaluated in the context of the global COVID-19 pandemic, which necessitated consideration that the patient might be at risk for infection with the SARS-CoV-2 virus that causes COVID-19. Institutional protocols and algorithms that pertain to the evaluation of patients at risk for COVID-19 are in a state of rapid change based on information released by regulatory bodies including the CDC and federal and state organizations. These policies and algorithms were followed during the patient's care in the ED.   Patient presents with severe generalized abdominal pain and tenderness.  Vital signs are normal.  Will check labs, CT scan given her prior abdominal surgeries  and diabetes history.  Give IV Toradol and fentanyl 50 mcg IV for initial pain relief, Zofran and fluids.   ----------------------------------------- 8:34 PM on 04/25/2020 -----------------------------------------  Feeling better.  Labs normal, CT unremarkable except for some enlarged lymph nodes consistent with post viral mesenteric adenitis.  We will continue NSAIDs, Zofran as needed, limited prescription of Percocet as needed, follow-up with PCP.     ____________________________________________  FINAL CLINICAL IMPRESSION(S) / ED DIAGNOSES    Final diagnoses:  Generalized abdominal pain  Mesenteric adenitis     ED Discharge Orders         Ordered    oxyCODONE-acetaminophen (PERCOCET) 5-325 MG tablet  Every 6 hours PRN        04/25/20 2033    ondansetron (ZOFRAN ODT) 4 MG disintegrating tablet  Every 8 hours PRN        04/25/20 2033          Portions of this note were generated with dragon dictation software. Dictation errors may occur despite best attempts at proofreading.   Carrie Mew, MD 04/25/20 2035

## 2020-04-25 NOTE — Progress Notes (Signed)
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  It does not show a definitive pinched nerve.  Over this may be enough for [place] to try a neck injection.  I do recommend that you return to clinic so we can talk about these results in full detail and discuss potential treatment plan and options into the future.

## 2020-04-25 NOTE — Discharge Instructions (Signed)
Your lab test today were all okay.  Your CT scan shows swollen lymph nodes in the abdomen causing your pain.  Please follow up with your primary care doctor to ensure symptoms resolve.

## 2020-04-25 NOTE — ED Triage Notes (Signed)
Pt to ED reporting generalized stabbing abd pain starting today. Pt and her husband had diarrhea last week without pain. Pt had a negative COVID test last week and denies having fevers.   Pt has hx of colectomy.

## 2020-04-26 MED ORDER — VARENICLINE TARTRATE 1 MG PO TABS: 1.0000 mg | ORAL_TABLET | Freq: Two times a day (BID) | ORAL | 3 refills | Status: DC

## 2020-04-26 NOTE — Telephone Encounter (Signed)
Patient seen in ED on 04/25/20 after call to Access Nurse for severe abdominal pain, requesting note for work in my chart message still having abdominal pain. I have pasted from chart Impression of CT scan, Patient was given Percocet 5/325 and zofran: Patient still rates pain without medication at 7 with medication a 4. A febrile ER physician believed related to enlarged lymph nodes discovered in CT following a stomach virus last week. Patient request work note for this week 04/25/20 - 04/27/20. Offered FU appt for Monday patient refused stating  She cannot miss anymore hours from work.  See my chart message and CT impression pasted below.  IMPRESSION: 1. Circumferential distal esophageal wall thickening. Correlate with signs and symptoms of esophagitis. Underlying mass cannot be fully excluded. Possible associated tiny hiatal hernia. 2. More hazy mesentery and slightly more prominent but nonenlarged mesenteric lymph nodes within the right mid abdomen. Recommend attention on follow-up. 3. Partial colectomy with stool noted within the rectum. Electronically Signed   By: Tish Frederickson M.D.   On: 04/25/2020 20:19

## 2020-04-26 NOTE — Progress Notes (Unsigned)
I, Gwendolyn [name], LAT, ATC, am serving as scribe for Dr. [name].  Gwendolyn [name] is a 50 y.o. female who presents to [place] at [facility] today for f/u of neck and R arm pain and c-spine MRI review.  She was last seen by Dr. Georgina Snell on 03/31/20 w/ c/o R post scapular pain radiating into her R UE to her forearm and was prescribed gabapentin and prednisone.  Since her last visit, pt reports con't and worsening neck and R arm pain. Not much relief from the second prednisone. Pt is in pain all the time.   Pt reports swollen lymph nodes in in abdomin. 04/25/20 pt was seen in ED with severe abdominal pain after having gastroenteritis for several days. Pt was dx w/ post-viral mesenteric adenitis. Pt says she's been in excruciating pain w/ no relief from percocet.  Pt is in need of a doctor's note to excuse her from work.  Diagnostic testing: C-spine MRI- 04/24/20; C-spine XR- 03/31/20   Pertinent review of systems: No fevers or chills.  Positive for abdominal pain and minimal shortness of breath.  No cough or congestion.  Recent negative COVID-19 test.  Surgical history significant for colectomy.  Relevant historical information: Bipolar disorder.   Exam:  BP 102/72 (BP Location: Right Arm, Patient Position: Sitting, Cuff Size: Normal)   Pulse 89   Ht 5\' 7"  (1.702 m)   Wt 182 lb 9.6 oz (82.8 kg)   SpO2 98%   BMI 28.60 kg/m  General: Well Developed, well nourished, and in no acute distress.   MSK: C-spine normal-appearing nontender normal cervical motion.  Right arm normal strength and sensation.  Abdomen mildly distended not particularly tender.  Lungs normal work of breathing.    Lab and Radiology Results DG Chest 2 View  Addendum Date: 04/27/2020   ADDENDUM REPORT: 04/27/2020 09:11 ADDENDUM: Question very subtle opacity at the LEFT lung base at the periphery, nonlinear. This could represent developing infection, consider viral or atypical etiologies.  These results will be called to the ordering clinician or representative by the Radiologist Assistant, and communication documented in the PACS or [employer]. Electronically Signed   By: Zetta Bills M.D.   On: 04/27/2020 09:11   Result Date: 04/27/2020 CLINICAL DATA:  Arm and abdominal pain, labored breathing. EXAM: CHEST - 2 VIEW COMPARISON:  Abdominal CT from April 25, 2020 FINDINGS: Trachea midline. Cardiomediastinal contours and hilar structures are normal. Lungs are clear. No sign of pleural effusion. No acute skeletal process to the extent evaluated. IMPRESSION: No acute cardiopulmonary disease. Electronically Signed: By: Zetta Bills M.D. On: 04/27/2020 09:08   MR CERVICAL SPINE WO CONTRAST  Result Date: 04/24/2020 CLINICAL DATA:  Cervical radiculopathy. Right neck pain and right shoulder and arm pain. EXAM: MRI CERVICAL SPINE WITHOUT CONTRAST TECHNIQUE: Multiplanar, multisequence MR imaging of the cervical spine was performed. No intravenous contrast was administered. COMPARISON:  Cervical spine radiographs 03/31/2020 FINDINGS: Alignment: Normal Vertebrae: Normal bone marrow.  Negative for fracture or mass. Cord: Normal signal and morphology. Posterior Fossa, vertebral arteries, paraspinal tissues: Negative Disc levels: C2-3: Small central disc protrusion.  Negative for stenosis C3-4: Small central disc protrusion.  Negative for stenosis C4-5: Small central disc protrusion.  Negative for stenosis C5-6: Small central disc protrusion.  Negative for stenosis C6-7: Small central disc protrusion and mild uncinate spurring. 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 Negative for neural impingement Electronically Signed  By: Marlan Palau M.D.   On: 04/24/2020 16:42   CT ABDOMEN PELVIS W CONTRAST  Result Date: 04/25/2020 CLINICAL DATA:  Pt to ED reporting generalized stabbing abd pain "all over" starting  today. Pt and her husband had diarrhea last week without pain. Pt had a negative COVID test last week and denies having fevers. EXAM: CT ABDOMEN AND PELVIS WITH CONTRAST TECHNIQUE: Multidetector CT imaging of the abdomen and pelvis was performed using the standard protocol following bolus administration of intravenous contrast. CONTRAST:  OMNIPAQUE IOHEXOL 300 MG/ML  SOLN COMPARISON:  CT abdomen pelvis 01/24/2018 FINDINGS: Lower chest: Bibasilar subsegmental atelectasis. Circumferential distal esophageal wall thickening. Possible tiny hiatal hernia. Hepatobiliary: Subcentimeter hypodensities are too small to characterize. No focal liver abnormality. Status post cholecystectomy. No biliary dilatation. Pancreas: No focal lesion. Normal pancreatic contour. No surrounding inflammatory changes. No main pancreatic ductal dilatation. Spleen: Normal in size without focal abnormality. Adrenals/Urinary Tract: No adrenal nodule bilaterally. Bilateral kidneys enhance symmetrically. No hydronephrosis. No hydroureter. The urinary bladder is unremarkable. Stomach/Bowel: Partial colectomy. Stomach is within normal limits. No evidence of bowel wall thickening or dilatation. Stool within the distal colon and rectum. Vascular/Lymphatic: No abdominal aorta or iliac aneurysm. Slightly more prominent but nonenlarged mesenteric lymph nodes within the right mid abdomen. No abdominal, pelvic, or inguinal lymphadenopathy. Reproductive: Status post hysterectomy. A 2.6 cm fluid density lesion within the right adnexa that could represent fluid within the small bowel versus adnexal cyst. Otherwise no adnexal mass. Other: Slightly more hazy mesentery (2:49). No intraperitoneal free fluid. No intraperitoneal free gas. No organized fluid collection. Musculoskeletal: No abdominal wall hernia or abnormality. No suspicious lytic or blastic osseous lesions. No acute displaced fracture. Mild degenerative changes of the spine. IMPRESSION: 1.  Circumferential distal esophageal wall thickening. Correlate with signs and symptoms of esophagitis. Underlying mass cannot be fully excluded. Possible associated tiny hiatal hernia. 2. More hazy mesentery and slightly more prominent but nonenlarged mesenteric lymph nodes within the right mid abdomen. Recommend attention on follow-up. 3. Partial colectomy with stool noted within the rectum. Electronically Signed   By: Tish Frederickson M.D.   On: 04/25/2020 20:19   I, Clementeen Graham, personally (independently) visualized and performed the interpretation of the MRI and chest x-ray images attached in this note.  Results for orders placed or performed during the hospital encounter of 04/25/20 (from the past 48 hour(s))  Lipase, blood     Status: None   Collection Time: 04/25/20  5:45 PM  Result Value Ref Range   Lipase 47 11 - 51 U/L    Comment: Performed at [facility], [address] [place], [place] [address]  Comprehensive metabolic panel     Status: Abnormal   Collection Time: 04/25/20  5:45 PM  Result Value Ref Range   Sodium 139 135 - 145 mmol/L   Potassium 3.9 3.5 - 5.1 mmol/L   Chloride 100 98 - 111 mmol/L   CO2 29 22 - 32 mmol/L   Glucose, Bld 105 (H) 70 - 99 mg/dL    Comment: Glucose reference range applies only to samples taken after fasting for at least 8 hours.   BUN 12 6 - 20 mg/dL   Creatinine, Ser 7.04 0.44 - 1.00 mg/dL   Calcium 8.6 (L) 8.9 - 10.3 mg/dL   Total Protein 6.7 6.5 - 8.1 g/dL   Albumin 3.6 3.5 - 5.0 g/dL   AST 17 15 - 41 U/L   ALT 25 0 - 44 U/L  Alkaline Phosphatase 32 (L) 38 - 126 U/L   Total Bilirubin 0.7 0.3 - 1.2 mg/dL   GFR, Estimated >60 >60 mL/min    Comment: (NOTE) Calculated using the CKD-EPI Creatinine Equation (2021)    Anion gap 10 5 - 15    Comment: Performed at [facility], [address] [place], [place] [address]  CBC     Status: Abnormal   Collection Time: 04/25/20  5:45 PM  Result Value Ref Range   WBC 11.8 (H) 4.0 -  10.5 K/uL   RBC 4.32 3.87 - 5.11 MIL/uL   Hemoglobin 13.6 12.0 - 15.0 g/dL   HCT 40.7 36.0 - 46.0 %   MCV 94.2 80.0 - 100.0 fL   MCH 31.5 26.0 - 34.0 pg   MCHC 33.4 30.0 - 36.0 g/dL   RDW 12.8 11.5 - 15.5 %   Platelets 351 150 - 400 K/uL   nRBC 0.0 0.0 - 0.2 %    Comment: Performed at [facility], [address] [place], [place] [address]      Assessment and Plan: 50 y.o. female with neck pain and arm pain on the right side thought to be cervical radiculopathy.  Recent MRI however was largely benign without obvious cervical radiculopathy at the C6 level which is the most likely explanation.  It still possible she does have some radiculopathy however will look for more peripheral neuropathies with nerve conduction study.  The meantime continue gabapentin which is helpful.  Abdominal pain.  Patient was recently in the emergency room first significant abdominal pain.  She still uncomfortable and generally unwell.  CT scan of the abdomen and pelvis showed mesenteric adenitis and a bit of esophagitis as the distal esophagitis was visible.  She does have a pertinent surgical history from colectomy.  I do think that it is worthwhile for her to have a follow-up appoint with a gastroenterologist for this.  We will go ahead and put a referral in now although this would typically be handled by PCP.  I do not think that delaying care required for PCP follow-up appointment is warranted in this situation.  The meantime short-term prescription for dicyclomine which may be helpful.  I ordered a chest x-ray to rule out Pancoast tumor causing right arm symptoms.  Fortunately that was not seen on x-ray however a left lower lobe atypical pneumonia was possibly seen.  Because she has the mesenteric adenopathy and esophagitis pattern as well as other issues I think a CT scan of her chest is warranted to further evaluate all the potential causes.  We will proceed with CT scan chest to further evaluate for  pneumonia and to rule out other causes of adenitis.  This should also help gastroenterology referral.  We will hold on antibiotic therapy for now.  Antibiotics may worsen her GI symptoms so should be cautious about its use.   Stressed the importance of prompt follow-up with PCP.   PDMP not reviewed this encounter. Orders Placed This Encounter  Procedures  . DG Chest 2 View    Standing Status:   Future    Number of Occurrences:   1    Standing Expiration Date:   04/27/2021    Order Specific Question:   Reason for Exam (SYMPTOM  OR DIAGNOSIS REQUIRED)    Answer:   eval arm pain and abd pain    Order Specific Question:   Is patient pregnant?    Answer:   No    Order Specific  Question:   Preferred imaging location?    Answer:   [facility]  . Ambulatory referral to Gastroenterology    Referral Priority:   Routine    Referral Type:   Consultation    Referral Reason:   Specialty Services Required    Number of Visits Requested:   1  . Ambulatory referral to Neurology    Referral Priority:   Routine    Referral Type:   Consultation    Referral Reason:   Specialty Services Required    Requested Specialty:   Neurology    Number of Visits Requested:   1  . NCV with EMG(electromyography)    Standing Status:   Future    Standing Expiration Date:   04/27/2021    Scheduling Instructions:     With neurology referral in the [place] area.    Order Specific Question:   Where should this test be performed?    Answer:   other   Meds ordered this encounter  Medications  . dicyclomine (BENTYL) 10 MG capsule    Sig: Take 1 capsule (10 mg total) by mouth 4 (four) times daily -  before meals and at bedtime.    Dispense:  120 capsule    Refill:  1  . predniSONE (DELTASONE) 50 MG tablet    Sig: Take 1 tablet (50 mg total) by mouth daily.    Dispense:  5 tablet    Refill:  0     Discussed warning signs or symptoms. Please see discharge instructions. Patient expresses  understanding.   The above documentation has been reviewed and is accurate and complete Clementeen Graham, M.D.

## 2020-04-27 ENCOUNTER — Other Ambulatory Visit: Payer: Self-pay

## 2020-04-27 ENCOUNTER — Ambulatory Visit (INDEPENDENT_AMBULATORY_CARE_PROVIDER_SITE_OTHER): Payer: BC Managed Care – PPO

## 2020-04-27 ENCOUNTER — Ambulatory Visit: Payer: BC Managed Care – PPO | Admitting: Family Medicine

## 2020-04-27 VITALS — BP 102/72 | HR 89 | Ht 67.0 in | Wt 182.6 lb

## 2020-04-27 DIAGNOSIS — K209 Esophagitis, unspecified without bleeding: Secondary | ICD-10-CM | POA: Diagnosis not present

## 2020-04-27 DIAGNOSIS — R1084 Generalized abdominal pain: Secondary | ICD-10-CM

## 2020-04-27 DIAGNOSIS — M5412 Radiculopathy, cervical region: Secondary | ICD-10-CM

## 2020-04-27 DIAGNOSIS — D369 Benign neoplasm, unspecified site: Secondary | ICD-10-CM

## 2020-04-27 DIAGNOSIS — R918 Other nonspecific abnormal finding of lung field: Secondary | ICD-10-CM

## 2020-04-27 DIAGNOSIS — R079 Chest pain, unspecified: Secondary | ICD-10-CM

## 2020-04-27 DIAGNOSIS — Z9049 Acquired absence of other specified parts of digestive tract: Secondary | ICD-10-CM

## 2020-04-27 MED ORDER — DICYCLOMINE HCL 10 MG PO CAPS: 10.0000 mg | ORAL_CAPSULE | Freq: Three times a day (TID) | ORAL | 1 refills | Status: DC

## 2020-04-27 MED ORDER — PREDNISONE 50 MG PO TABS: 50.0000 mg | ORAL_TABLET | Freq: Every day | ORAL | 0 refills | Status: DC

## 2020-04-27 NOTE — Patient Instructions (Addendum)
Thank you for coming in today.  Please get an Xray today before you leave  Take the prednisone   Continue the gabapentin.   You should hear about referral to neurology and gastroenterology.   Keep me updated.

## 2020-04-27 NOTE — Progress Notes (Signed)
Radiologist are a bit concerned about a very subtle and possible pneumonia in the left lower lung field.  I am not convinced.  As we discussed I think we should proceed to CT scan of the chest.  I already ordered it you should hear soon about scheduling.  This should definitely help [place] understand about pneumonia as well as other potential causes for your arm pain.

## 2020-04-28 ENCOUNTER — Other Ambulatory Visit: Payer: Self-pay | Admitting: Internal Medicine

## 2020-05-02 ENCOUNTER — Encounter: Payer: Self-pay | Admitting: Family Medicine

## 2020-05-04 ENCOUNTER — Other Ambulatory Visit: Payer: Self-pay

## 2020-05-04 ENCOUNTER — Ambulatory Visit: Payer: BC Managed Care – PPO | Admitting: Gastroenterology

## 2020-05-04 ENCOUNTER — Encounter: Payer: Self-pay | Admitting: Gastroenterology

## 2020-05-04 VITALS — BP 110/87 | HR 87 | Temp 98.3°F | Ht 67.0 in | Wt 184.0 lb

## 2020-05-04 DIAGNOSIS — K209 Esophagitis, unspecified without bleeding: Secondary | ICD-10-CM

## 2020-05-04 NOTE — Progress Notes (Signed)
Gwendolyn [name] [address]  [address]  [place], [place] [address]  Main: [phone]  Fax: [phone]   Gastroenterology Consultation  Referring Provider:     Sherlene Shamsullo, Teresa L, MD Primary Care Physician:  Sherlene Shamsullo, Teresa L, MD Reason for Consultation:     Abdominal pain, abnormal CT        HPI:   Chief complaint: Abdominal pain  Gwendolyn [name] is a 50 y.o. y/o female referred for consultation & management  by Dr. Sherlene Shamsullo, Teresa L, MD.  Patient previously seen by Dr. 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  Patient recently went to the ER due to abdominal pain and change in consistency of her bowel movements to completely watery.  No blood in stool.  States, her husband also had diarrhea from a GI bug at the same time.  The bowel movements are now becoming more formed.  No nausea or vomiting.  Describes abdominal pain to be epigastric, dull, 5 or 10, nonradiating.  CT in the ER showed circumferential distal esophageal wall thickening and states "underlying mass cannot be fully excluded".  Hazy mesentery and slightly prominent nonenlarged mesenteric lymph nodes within the right mid abdomen were reported.  Recommended attention on follow-up was noted on radiology report.  Patient denies any dysphagia.  No prior upper endoscopy.  Had a flexible sigmoidoscopy in 2017 with Dr. Rhea BeltonPyrtle.  Please see procedure report.  Past Medical History:  Diagnosis Date  . Anal fissure   . Anxiety   . Breast cancer (HCC) 2002   lumpectomy-Right  . Depression   . Family history of brain cancer   . Family history of colon cancer   . Fibromyalgia   . Gallstones   . Irritable bowel syndrome   . Personal history of radiation therapy   . Yeast vaginitis     Past Surgical History:  Procedure Laterality Date  . ABDOMINAL HYSTERECTOMY    . 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 BREAST BIOPSY     . BREAST LUMPECTOMY Right 2003   Radiatiomn Therapy DCIS  . CHOLECYSTECTOMY  2000  . COLECTOMY  10/31/2012   SECONDARY TO COLONIC INERTIA  . 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 SUPRACERVICAL ABDOMINAL HYSTERECTOMY  04/10/2011   But still has ovaries.    Prior to Admission medications   Medication Sig Start Date End Date Taking? Authorizing Provider  amphetamine-dextroamphetamine (ADDERALL) 30 MG tablet Take 30 mg by mouth 2 (two) times daily.  11/02/16  Yes [provider]  clotrimazole-betamethasone (LOTRISONE) cream Apply 1 application topically 2 (two) times daily. 10/19/19  Yes Sherlene Shamsullo, Teresa L, MD  dicyclomine (BENTYL) 10 MG capsule Take 1 capsule (10 mg total) by mouth 4 (four) times daily -  before meals and at bedtime. 04/27/20  Yes Rodolph Bongorey, Evan S, MD  DULoxetine HCl 40 MG CPEP Take 2 capsules by mouth every morning. 03/30/19  Yes [provider]  gabapentin (NEURONTIN) 800 MG tablet Take 1 tablet (800 mg total) by mouth 3 (three) times daily as needed (nerve pain). 03/31/20  Yes Rodolph Bongorey, Evan S, MD  Ginkgo Biloba 40 MG TABS Take by mouth.   Yes [provider]  Melatonin 3 MG TABS Take 1 tablet by mouth at bedtime.   Yes [provider]  meloxicam (MOBIC) 15 MG tablet TAKE 1 TABLET BY MOUTH  EVERY DAY 01/27/20  Yes [name], MD  metFORMIN (GLUCOPHAGE-XR) 500 MG 24 hr tablet TAKE 1 TABLET BY MOUTH EVERY DAY WITH BREAKFAST 12/17/19  Yes [name], MD  tiaGABine (GABITRIL) 4 MG tablet Takes 4mg  in the AM and 12mg  at bedtime   Yes [provider]  trimethoprim (TRIMPEX) 100 MG tablet Take 1 tablet (100 mg total) by mouth as needed (post-coital prophylaxis to prevent UTIs). 05/19/19  Yes Billey Co, MD  varenicline (CHANTIX) 1 MG tablet Take 1 tablet (1 mg total) by mouth 2 (two) times daily. 04/26/20  Yes [name], MD  zolpidem (AMBIEN) 10 MG tablet Take 1 tablet (10  mg total) by mouth at bedtime as needed for sleep. 12/02/17  Yes [name], MD  metroNIDAZOLE (METROGEL VAGINAL) 0.75 % vaginal gel Place 1 Applicatorful vaginally 2 (two) times daily. Patient not taking: Reported on 05/04/2020 11/24/19   Pleas Koch, NP  ondansetron (ZOFRAN ODT) 4 MG disintegrating tablet Take 1 tablet (4 mg total) by mouth every 8 (eight) hours as needed for nausea or vomiting. Patient not taking: Reported on 05/04/2020 04/25/20   Carrie Mew, MD  oxyCODONE-acetaminophen (PERCOCET) 5-325 MG tablet Take 1 tablet by mouth every 6 (six) hours as needed for severe pain. Patient not taking: Reported on 05/04/2020 04/25/20 04/25/21  Carrie Mew, MD  predniSONE (DELTASONE) 50 MG tablet Take 1 tablet (50 mg total) by mouth daily. Patient not taking: Reported on 05/04/2020 04/27/20   Gregor Hams, MD  sertraline (ZOLOFT) 25 MG tablet Take 25 mg by mouth daily. Patient not taking: Reported on 05/04/2020 10/12/19   [provider]    Family History  Problem Relation Age of Onset  . Meniere's disease Father   . Alcohol abuse Sister   . Heart disease Maternal Grandmother   . Heart disease Maternal Grandfather   . Brain cancer Maternal Grandfather   . Heart disease Paternal Grandmother   . Heart disease Paternal Grandfather   . Rectal cancer Other        MGF's mother  . Colon cancer Neg Hx   . Esophageal cancer Neg Hx   . Breast cancer Neg Hx      Social History   Tobacco Use  . Smoking status: Former Smoker    Packs/day: 1.00    Years: 20.00    Pack years: 20.00    Quit date: 04/23/2002    Years since quitting: 18.0  . Smokeless tobacco: Never Used  . Tobacco comment: QUIT IN 2004  Vaping Use  . Vaping Use: Never used  Substance Use Topics  . Alcohol use: Yes    Alcohol/week: 0.0 standard drinks    Comment: OCCASIONALLY  . Drug use: No    Allergies as of 05/04/2020 - Review Complete 05/04/2020  Allergen Reaction Noted  . Amoxicillin-pot  clavulanate  10/05/2014  . Morphine Other (See Comments) 10/05/2014  . Penicillins  10/06/2014    Review of Systems:    All systems reviewed and negative except where noted in HPI.   Physical Exam:  BP 110/87   Pulse 87   Temp 98.3 F (36.8 C) (Oral)   Ht 5\' 7"  (1.702 m)   Wt 184 lb (83.5 kg)   BMI 28.82 kg/m  No LMP recorded. Patient has had a hysterectomy. Psych:  Alert and cooperative. Normal mood and affect. General:   Alert,  Well-developed, well-nourished, pleasant and cooperative in NAD Head:  Normocephalic and atraumatic. Eyes:  Sclera clear, no icterus.   Conjunctiva pink. Ears:  Normal auditory acuity. Nose:  No deformity, discharge, or lesions. Mouth:  No deformity or lesions,oropharynx pink & moist. Neck:  Supple; no masses or thyromegaly. Abdomen:  Normal bowel sounds.  No bruits.  Soft, tender to palpation diffusely, no signs of peritonitis, and non-distended without masses, hepatosplenomegaly or hernias noted.  No guarding or rebound tenderness.    Msk:  Symmetrical without gross deformities. Good, equal movement & strength bilaterally. Pulses:  Normal pulses noted. Extremities:  No clubbing or edema.  No cyanosis. Neurologic:  Alert and oriented x3;  grossly normal neurologically. Skin:  Intact without significant lesions or rashes. No jaundice. Lymph Nodes:  No significant cervical adenopathy. Psych:  Alert and cooperative. Normal mood and affect.   Labs: CBC    Component Value Date/Time   WBC 11.8 (H) 04/25/2020 1745   RBC 4.32 04/25/2020 1745   HGB 13.6 04/25/2020 1745   HGB 14.1 10/02/2017 0718   HCT 40.7 04/25/2020 1745   HCT 42.7 10/02/2017 0718   PLT 351 04/25/2020 1745   PLT 318 10/02/2017 0718   MCV 94.2 04/25/2020 1745   MCV 92 10/02/2017 0718   MCV 90 08/08/2013 1242   MCH 31.5 04/25/2020 1745   MCHC 33.4 04/25/2020 1745   RDW 12.8 04/25/2020 1745   RDW 13.5 10/02/2017 0718   RDW 13.6 08/08/2013 1242   LYMPHSABS 1,840 10/21/2019 0809    LYMPHSABS 1.6 10/02/2017 0718   LYMPHSABS 1.3 08/08/2013 1242   MONOABS 0.5 03/26/2018 1243   MONOABS 0.5 08/08/2013 1242   EOSABS 183 10/21/2019 0809   EOSABS 0.1 10/02/2017 0718   EOSABS 0.1 08/08/2013 1242   BASOSABS 29 10/21/2019 0809   BASOSABS 0.0 10/02/2017 0718   BASOSABS 0.0 08/08/2013 1242   CMP     Component Value Date/Time   NA 139 04/25/2020 1745   NA 138 10/02/2017 0718   K 3.9 04/25/2020 1745   CL 100 04/25/2020 1745   CO2 29 04/25/2020 1745   GLUCOSE 105 (H) 04/25/2020 1745   BUN 12 04/25/2020 1745   BUN 15 12/31/2017 1325   CREATININE 0.73 04/25/2020 1745   CREATININE 0.94 10/21/2019 0809   CALCIUM 8.6 (L) 04/25/2020 1745   PROT 6.7 04/25/2020 1745   PROT 6.7 10/02/2017 0718   ALBUMIN 3.6 04/25/2020 1745   ALBUMIN 4.6 10/02/2017 0718   AST 17 04/25/2020 1745   ALT 25 04/25/2020 1745   ALKPHOS 32 (L) 04/25/2020 1745   BILITOT 0.7 04/25/2020 1745   BILITOT 0.5 10/02/2017 0718   GFRNONAA >60 04/25/2020 1745   GFRAA >60 03/26/2018 1243    Imaging Studies: DG Chest 2 View  Addendum Date: 04/27/2020   ADDENDUM REPORT: 04/27/2020 09:11 ADDENDUM: Question very subtle opacity at the LEFT lung base at the periphery, nonlinear. This could represent developing infection, consider viral or atypical etiologies. These results will be called to the ordering clinician or representative by the Radiologist Assistant, and communication documented in the PACS or [employer]. Electronically Signed   By: Zetta Bills M.D.   On: 04/27/2020 09:11   Result Date: 04/27/2020 CLINICAL DATA:  Arm and abdominal pain, labored breathing. EXAM: CHEST - 2 VIEW COMPARISON:  Abdominal CT from April 25, 2020 FINDINGS: Trachea midline. Cardiomediastinal contours and hilar structures are normal. Lungs are clear. No sign of pleural effusion. No acute skeletal process to the extent evaluated. IMPRESSION: No acute cardiopulmonary disease. Electronically Signed: By: Zetta Bills M.D. On:  04/27/2020 09:08   MR CERVICAL SPINE WO CONTRAST  Result Date: 04/24/2020 CLINICAL DATA:  Cervical radiculopathy. Right neck pain and right shoulder and arm pain. EXAM: MRI CERVICAL SPINE WITHOUT CONTRAST TECHNIQUE: Multiplanar, multisequence MR imaging of the cervical spine was performed. No intravenous contrast was administered. COMPARISON:  Cervical spine radiographs 03/31/2020 FINDINGS: Alignment: Normal Vertebrae: Normal bone marrow.  Negative for fracture or mass. Cord: Normal signal and morphology. Posterior Fossa, vertebral arteries, paraspinal tissues: Negative Disc levels: C2-3: Small central disc protrusion.  Negative for stenosis C3-4: Small central disc protrusion.  Negative for stenosis C4-5: Small central disc protrusion.  Negative for stenosis C5-6: Small central disc protrusion.  Negative for stenosis C6-7: Small central disc protrusion and mild uncinate spurring. 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 Negative for neural impingement Electronically Signed   By: Franchot Gallo M.D.   On: 04/24/2020 16:42   CT ABDOMEN PELVIS W CONTRAST  Result Date: 04/25/2020 CLINICAL DATA:  Pt to ED reporting generalized stabbing abd pain "all over" starting today. Pt and her husband had diarrhea last week without pain. Pt had a negative COVID test last week and denies having fevers. EXAM: CT ABDOMEN AND PELVIS WITH CONTRAST TECHNIQUE: Multidetector CT imaging of the abdomen and pelvis was performed using the standard protocol following bolus administration of intravenous contrast. CONTRAST:  136mL OMNIPAQUE IOHEXOL 300 MG/ML  SOLN COMPARISON:  CT abdomen pelvis 01/24/2018 FINDINGS: Lower chest: Bibasilar subsegmental atelectasis. Circumferential distal esophageal wall thickening. Possible tiny hiatal hernia. Hepatobiliary: Subcentimeter hypodensities are too small to characterize. No focal liver abnormality. Status  post cholecystectomy. No biliary dilatation. Pancreas: No focal lesion. Normal pancreatic contour. No surrounding inflammatory changes. No main pancreatic ductal dilatation. Spleen: Normal in size without focal abnormality. Adrenals/Urinary Tract: No adrenal nodule bilaterally. Bilateral kidneys enhance symmetrically. No hydronephrosis. No hydroureter. The urinary bladder is unremarkable. Stomach/Bowel: Partial colectomy. Stomach is within normal limits. No evidence of bowel wall thickening or dilatation. Stool within the distal colon and rectum. Vascular/Lymphatic: No abdominal aorta or iliac aneurysm. Slightly more prominent but nonenlarged mesenteric lymph nodes within the right mid abdomen. No abdominal, pelvic, or inguinal lymphadenopathy. Reproductive: Status post hysterectomy. A 2.6 cm fluid density lesion within the right adnexa that could represent fluid within the small bowel versus adnexal cyst. Otherwise no adnexal mass. Other: Slightly more hazy mesentery (2:49). No intraperitoneal free fluid. No intraperitoneal free gas. No organized fluid collection. Musculoskeletal: No abdominal wall hernia or abnormality. No suspicious lytic or blastic osseous lesions. No acute displaced fracture. Mild degenerative changes of the spine. IMPRESSION: 1. Circumferential distal esophageal wall thickening. Correlate with signs and symptoms of esophagitis. Underlying mass cannot be fully excluded. Possible associated tiny hiatal hernia. 2. More hazy mesentery and slightly more prominent but nonenlarged mesenteric lymph nodes within the right mid abdomen. Recommend attention on follow-up. 3. Partial colectomy with stool noted within the rectum. Electronically Signed   By: Iven Finn M.D.   On: 04/25/2020 20:19    Assessment and Plan:   Breyah Akhter is a 50 y.o. y/o female has been referred for abnormal CT scan and abdominal pain  Due to distal esophageal thickening seen on CT scan, and patient's  epigastric pain, important to rule out underlying mass in that area.  Patient agreeable to EGD for further evaluation  Start omeprazole for 4 weeks due to her epigastric pain  Prominent mesenteric lymph nodes were reported and more likely due to  gastroenteritis at the time of the CT scan.  Can follow-up with repeat CT scan in the future.   CBC CMP otherwise reassuring.  No indication for lower endoscopy.  (Risks of PPI use were discussed with patient including bone loss, C. Diff diarrhea, pneumonia, infections, CKD, electrolyte abnormalities.  Pt. Verbalizes understanding and chooses to continue the medication.)   Dr Vonda Antigua  Speech recognition software was used to dictate the above note.

## 2020-05-05 ENCOUNTER — Encounter: Payer: Self-pay | Admitting: Anesthesiology

## 2020-05-05 ENCOUNTER — Other Ambulatory Visit
Admission: RE | Admit: 2020-05-05 | Discharge: 2020-05-05 | Disposition: A | Discharge status: Home / Self Care | Payer: BC Managed Care – PPO | Source: Ambulatory Visit | Attending: Gastroenterology | Admitting: Gastroenterology

## 2020-05-05 ENCOUNTER — Encounter: Payer: Self-pay | Admitting: Gastroenterology

## 2020-05-05 ENCOUNTER — Other Ambulatory Visit: Payer: Self-pay

## 2020-05-05 DIAGNOSIS — Z20822 Contact with and (suspected) exposure to covid-19: Secondary | ICD-10-CM | POA: Diagnosis not present

## 2020-05-05 DIAGNOSIS — Z01812 Encounter for preprocedural laboratory examination: Secondary | ICD-10-CM | POA: Diagnosis present

## 2020-05-05 NOTE — Discharge Instructions (Signed)

## 2020-05-06 ENCOUNTER — Other Ambulatory Visit: Payer: Self-pay | Admitting: *Deleted

## 2020-05-06 DIAGNOSIS — D0511 Intraductal carcinoma in situ of right breast: Secondary | ICD-10-CM

## 2020-05-06 LAB — SARS CORONAVIRUS 2 (TAT 6-24 HRS): SARS Coronavirus 2: NEGATIVE

## 2020-05-09 ENCOUNTER — Inpatient Hospital Stay: Payer: BC Managed Care – PPO

## 2020-05-09 ENCOUNTER — Other Ambulatory Visit: Payer: Self-pay

## 2020-05-09 ENCOUNTER — Ambulatory Visit: Payer: BC Managed Care – PPO | Admitting: Urology

## 2020-05-09 ENCOUNTER — Inpatient Hospital Stay: Payer: BC Managed Care – PPO | Attending: Nurse Practitioner | Admitting: Nurse Practitioner

## 2020-05-09 DIAGNOSIS — K209 Esophagitis, unspecified without bleeding: Secondary | ICD-10-CM

## 2020-05-09 NOTE — Progress Notes (Deleted)
[facility]   Telephone:([phone] [phone]   Clinic Follow up Note   Patient Care Team: [name], MD as PCP - General (Internal Medicine) Alphonsa Overall, MD as Consulting Physician (General Surgery) 05/09/2020  CHIEF COMPLAINT: Follow-up right breast DCIS  SUMMARY OF ONCOLOGIC HISTORY: Oncology History Overview Note  Cancer Staging No matching staging information was found for the patient.    DCIS (ductal carcinoma in situ)  11/06/2018 Mammogram   Mammogram 11/06/18  IMPRESSION: No mammographic evidence of malignancy. A result letter of this screening mammogram will be mailed directly to the patient.   11/13/2018 Surgery   B/l Breast Reduction   11/13/2018 Pathology Results    Diagnosis 11/13/18 1. Breast, Mammoplasty, right tissue, 76g - DUCTAL CARCINOMA IN SITU. 0.4cm 2. Breast, Mammoplasty, left tissue, 212g - BENIGN BREAST TISSUE.  Estrogen Receptor: 95%, POSITIVE, STRONG STAINING INTENSITY Progesterone Receptor: 95%, POSITIVE, STRONG STAINING INTENSITY   12/06/2018 Initial Diagnosis   Ductal carcinoma in situ (DCIS) of right breast   01/16/2019 Genetic Testing   Negative genetic testing on the common hereditary cancer panel.  The Common Hereditary Gene Panel offered by [facility] includes sequencing and/or deletion duplication testing of the following 48 genes: APC, ATM, AXIN2, BARD1, BMPR1A, BRCA1, BRCA2, BRIP1, CDH1, CDK4, CDKN2A (p14ARF), CDKN2A (p16INK4a), CHEK2, CTNNA1, DICER1, EPCAM (Deletion/duplication testing only), GREM1 (promoter region deletion/duplication testing only), KIT, MEN1, MLH1, MSH2, MSH3, MSH6, MUTYH, NBN, NF1, NHTL1, PALB2, PDGFRA, PMS2, POLD1, POLE, PTEN, RAD50, RAD51C, RAD51D, RNF43, SDHB, SDHC, SDHD, SMAD4, SMARCA4. STK11, TP53, TSC1, TSC2, and VHL.  The following genes were evaluated for sequence changes only: SDHA and HOXB13 c.251G>A variant only. The report date is January 16, 2019.   02/24/2019 Breast MRI    IMPRESSION: 1. Indeterminate 6 mm enhancing mass in the upper outer left breast. Recommendation is for diagnostic evaluation with mammography and ultrasound. If there is no mammographic or sonographic correlate, recommendation is to proceed with MRI guided biopsy. 2. No suspicious MRI findings on the right.   03/02/2019 Breast US   Bilateral mammo/L br US IMPRESSION: Expected bilateral post reduction surgical scarring as described. No focal abnormality over the upper outer left breast to correlate to the recent MRI finding.   03/12/2019 Breast MRI   MRI Guided biopsy attempt IMPRESSION: The small mass in the upper-outer quadrant of the left breast which was the target of the intended MRI guided biopsy today is obscured by marked increase in background parenchymal enhancement. Therefore the biopsy was canceled, and will need to be rescheduled.   03/24/2019 Pathology Results   Diagnosis Breast, left, needle core biopsy, upper outer - FIBROCYSTIC CHANGES. - PRIOR PROCEDURE SITE CHANGES.   11/10/2019 Breast MRI   IMPRESSION: No MR evidence of breast malignancy. RECOMMENDATION: Bilateral diagnostic mammogram in 6 months to resume annual mammogram schedule in this patient with history of RIGHT DCIS in 2020     CURRENT THERAPY: Close surveillance, alternating every 6 month mammogram and bilateral breast MRI  INTERVAL HISTORY: Gwendolyn [name] returns for follow-up as scheduled.  She was last seen for routine surveillance visit on 11/18/2019.  She was seen recently in the ED on 04/25/2020 with abdominal pain secondary to a viral illness with diarrhea, body aches, and fatigue.  She tested negative for COVID-19.  A CTAP with contrast showed s/p partial colectomy with stool in the rectum, prominent but nonenlarged mesenteric lymph nodes within the right middle abdomen, and circumferential distal esophageal wall thickening.  She had an upper endoscopy  scheduled today that was canceled for  inclement weather.  Today she presents   REVIEW OF SYSTEMS:   Constitutional: Denies fevers, chills or abnormal weight loss Eyes: Denies blurriness of vision Ears, nose, mouth, throat, and face: Denies mucositis or sore throat Respiratory: Denies cough, dyspnea or wheezes Cardiovascular: Denies palpitation, chest discomfort or lower extremity swelling Gastrointestinal:  Denies nausea, heartburn or change in bowel habits Skin: Denies abnormal skin rashes Lymphatics: Denies new lymphadenopathy or easy bruising Neurological:Denies numbness, tingling or new weaknesses Behavioral/Psych: Mood is stable, no new changes  All other systems were reviewed with the patient and are negative.  MEDICAL HISTORY:  Past Medical History:  Diagnosis Date  . Anal fissure   . Anxiety   . Breast cancer ([place]) 2002   lumpectomy-Right  . Depression   . Family history of brain cancer   . Family history of colon cancer   . Fibromyalgia   . Gallstones   . Irritable bowel syndrome   . Motion sickness    cars, boats  . Personal history of radiation therapy   . Yeast vaginitis     SURGICAL HISTORY: Past Surgical History:  Procedure Laterality Date  . ABDOMINAL HYSTERECTOMY    . 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 BREAST BIOPSY    . BREAST LUMPECTOMY Right 2003   Radiatiomn Therapy DCIS  . CHOLECYSTECTOMY  2000  . COLECTOMY  10/31/2012   SECONDARY TO COLONIC INERTIA  . REDUCTION MAMMAPLASTY Bilateral 11/13/2018   right breast 54mm DCIS found during breast reduction  . SUPRACERVICAL ABDOMINAL HYSTERECTOMY  04/10/2011   But still has ovaries.    I have reviewed the social history and family history with the patient and they are unchanged from previous note.  ALLERGIES:  is allergic to amoxicillin-pot clavulanate, morphine, and penicillins.  MEDICATIONS:  Current Outpatient Medications  Medication Sig Dispense Refill  . amphetamine-dextroamphetamine (ADDERALL) 30 MG tablet Take  30 mg by mouth 2 (two) times daily.   0  . APPLE CIDER VINEGAR PO Take 1,600 mg by mouth daily.    . Ashwagandha 500 MG CAPS Take 3,000 mg by mouth at bedtime.    . clotrimazole-betamethasone (LOTRISONE) cream Apply 1 application topically 2 (two) times daily. (Patient not taking: Reported on 05/05/2020) 30 g 0  . Cranberry 1000 MG CAPS Take 30,000 mg by mouth at bedtime.    . Cyanocobalamin (VITAMIN B-12) 5000 MCG TBDP Take by mouth.    . dicyclomine (BENTYL) 10 MG capsule Take 1 capsule (10 mg total) by mouth 4 (four) times daily -  before meals and at bedtime. (Patient not taking: Reported on 05/05/2020) 120 capsule 1  . DULoxetine HCl 40 MG CPEP Take 2 capsules by mouth every morning.    . Ferrous Sulfate (IRON PO) Take 25 mg by mouth.    . FIBER PO Take by mouth daily.    Marland Kitchen gabapentin (NEURONTIN) 800 MG tablet Take 1 tablet (800 mg total) by mouth 3 (three) times daily as needed (nerve pain). 180 tablet 1  . Ginkgo Biloba 40 MG TABS Take by mouth.    . Glutamine 500 MG TABS Take 1,000 mg by mouth in the morning and at bedtime.    . Melatonin 3 MG TABS Take 1 tablet by mouth at bedtime.    . meloxicam (MOBIC) 15 MG tablet TAKE 1 TABLET BY MOUTH EVERY DAY 30 tablet 2  . metFORMIN (GLUCOPHAGE-XR) 500 MG 24 hr tablet TAKE 1 TABLET BY  MOUTH EVERY DAY WITH BREAKFAST 90 tablet 1  . metroNIDAZOLE (METROGEL VAGINAL) 0.75 % vaginal gel Place 1 Applicatorful vaginally 2 (two) times daily. (Patient not taking: Reported on 05/04/2020) 70 g 0  . ondansetron (ZOFRAN ODT) 4 MG disintegrating tablet Take 1 tablet (4 mg total) by mouth every 8 (eight) hours as needed for nausea or vomiting. (Patient not taking: No sig reported) 20 tablet 0  . oxyCODONE-acetaminophen (PERCOCET) 5-325 MG tablet Take 1 tablet by mouth every 6 (six) hours as needed for severe pain. (Patient not taking: No sig reported) 8 tablet 0  . predniSONE (DELTASONE) 50 MG tablet Take 1 tablet (50 mg total) by mouth daily. (Patient not taking:  No sig reported) 5 tablet 0  . Probiotic Product (PROBIOTIC PO) Take by mouth.    . sertraline (ZOLOFT) 25 MG tablet Take 25 mg by mouth daily. (Patient not taking: No sig reported)    . tiaGABine (GABITRIL) 4 MG tablet Takes $RemoveBefo'4mg'NaThIlihsUx$  in the AM and $Remo'12mg'hSOTO$  at bedtime    . trimethoprim (TRIMPEX) 100 MG tablet Take 1 tablet (100 mg total) by mouth as needed (post-coital prophylaxis to prevent UTIs). 30 tablet 3  . varenicline (CHANTIX) 1 MG tablet Take 1 tablet (1 mg total) by mouth 2 (two) times daily. 60 tablet 3  . zolpidem (AMBIEN) 10 MG tablet Take 1 tablet (10 mg total) by mouth at bedtime as needed for sleep. 30 tablet 5   No current facility-administered medications for this visit.    PHYSICAL EXAMINATION: ECOG PERFORMANCE STATUS: {CHL ONC ECOG PS:[id number]}  There were no vitals filed for this visit. There were no vitals filed for this visit.  GENERAL:alert, no distress and comfortable SKIN: skin color, texture, turgor are normal, no rashes or significant lesions EYES: normal, Conjunctiva are pink and non-injected, sclera clear OROPHARYNX:no exudate, no erythema and lips, buccal mucosa, and tongue normal  NECK: supple, thyroid normal size, non-tender, without nodularity LYMPH:  no palpable lymphadenopathy in the cervical, axillary or inguinal LUNGS: clear to auscultation and percussion with normal breathing effort HEART: regular rate & rhythm and no murmurs and no lower extremity edema ABDOMEN:abdomen soft, non-tender and normal bowel sounds Musculoskeletal:no cyanosis of digits and no clubbing  NEURO: alert & oriented x 3 with fluent speech, no focal motor/sensory deficits  LABORATORY DATA:  I have reviewed the data as listed CBC Latest Ref Rng & Units 04/25/2020 10/21/2019 03/29/2018  WBC 4.0 - 10.5 K/uL 11.8(H) 7.3 7.7  Hemoglobin 12.0 - 15.0 g/dL 13.6 13.5 12.5  Hematocrit 36.0 - 46.0 % 40.7 41.0 40.1  Platelets 150 - 400 K/uL 351 250 267     CMP Latest Ref Rng & Units 04/25/2020  10/21/2019 06/05/2018  Glucose 70 - 99 mg/dL 105(H) 94 96  BUN 6 - 20 mg/dL $Remove'12 11 11  'DlNIuwQ$ Creatinine 0.44 - 1.00 mg/dL 0.73 0.94 0.81  Sodium 135 - 145 mmol/L 139 140 136  Potassium 3.5 - 5.1 mmol/L 3.9 4.1 4.2  Chloride 98 - 111 mmol/L 100 104 103  CO2 22 - 32 mmol/L $RemoveB'29 29 23  'EhcJGOMX$ Calcium 8.9 - 10.3 mg/dL 8.6(L) 9.0 9.4  Total Protein 6.5 - 8.1 g/dL 6.7 6.1 7.3  Total Bilirubin 0.3 - 1.2 mg/dL 0.7 0.6 0.6  Alkaline Phos 38 - 126 U/L 32(L) - 29(L)  AST 15 - 41 U/L $Remo'17 14 17  'oqBGg$ ALT 0 - 44 U/L $Remo'25 11 13      'zevhq$ RADIOGRAPHIC STUDIES: I have personally reviewed the radiological images as listed  and agreed with the findings in the report. No results found.   ASSESSMENT & PLAN:  No problem-specific Assessment & Plan notes found for this encounter.   No orders of the defined types were placed in this encounter.  All questions were answered. The patient knows to call the clinic with any problems, questions or concerns. No barriers to learning was detected. I spent {CHL ONC TIME VISIT - WGNFA:[id number]} counseling the patient face to face. The total time spent in the appointment was {CHL ONC TIME VISIT - ONGEX:5284132440} and more than 50% was on counseling and review of test results     Alla Feeling, NP 05/09/20

## 2020-05-10 ENCOUNTER — Telehealth: Payer: Self-pay | Admitting: Nurse Practitioner

## 2020-05-10 ENCOUNTER — Encounter: Payer: Self-pay | Admitting: Gastroenterology

## 2020-05-10 NOTE — Telephone Encounter (Signed)
Scheduled appointments per 1/18 sch msg. Spoke to patient who is aware of appointments date and times.  

## 2020-05-11 ENCOUNTER — Encounter
Admission: RE | Disposition: A | Discharge status: Home / Self Care | Payer: Self-pay | Source: Home / Self Care | Attending: Gastroenterology

## 2020-05-11 ENCOUNTER — Encounter: Payer: Self-pay | Admitting: Gastroenterology

## 2020-05-11 ENCOUNTER — Other Ambulatory Visit: Payer: Self-pay

## 2020-05-11 ENCOUNTER — Ambulatory Visit
Admission: RE | Admit: 2020-05-11 | Discharge: 2020-05-11 | Disposition: A | Discharge status: Home / Self Care | Payer: BC Managed Care – PPO | Attending: Gastroenterology | Admitting: Gastroenterology

## 2020-05-11 ENCOUNTER — Encounter: Payer: Self-pay | Admitting: Anesthesiology

## 2020-05-11 ENCOUNTER — Ambulatory Visit: Payer: Self-pay | Admitting: Anesthesiology

## 2020-05-11 DIAGNOSIS — K319 Disease of stomach and duodenum, unspecified: Secondary | ICD-10-CM | POA: Diagnosis not present

## 2020-05-11 DIAGNOSIS — Z79899 Other long term (current) drug therapy: Secondary | ICD-10-CM | POA: Insufficient documentation

## 2020-05-11 DIAGNOSIS — K2100 Gastro-esophageal reflux disease with esophagitis, without bleeding: Secondary | ICD-10-CM | POA: Insufficient documentation

## 2020-05-11 DIAGNOSIS — K209 Esophagitis, unspecified without bleeding: Secondary | ICD-10-CM

## 2020-05-11 DIAGNOSIS — Z885 Allergy status to narcotic agent status: Secondary | ICD-10-CM | POA: Insufficient documentation

## 2020-05-11 DIAGNOSIS — R109 Unspecified abdominal pain: Secondary | ICD-10-CM | POA: Diagnosis present

## 2020-05-11 DIAGNOSIS — Z87891 Personal history of nicotine dependence: Secondary | ICD-10-CM | POA: Diagnosis not present

## 2020-05-11 DIAGNOSIS — K449 Diaphragmatic hernia without obstruction or gangrene: Secondary | ICD-10-CM | POA: Diagnosis not present

## 2020-05-11 DIAGNOSIS — Z791 Long term (current) use of non-steroidal anti-inflammatories (NSAID): Secondary | ICD-10-CM | POA: Insufficient documentation

## 2020-05-11 DIAGNOSIS — K3189 Other diseases of stomach and duodenum: Secondary | ICD-10-CM | POA: Diagnosis not present

## 2020-05-11 DIAGNOSIS — R933 Abnormal findings on diagnostic imaging of other parts of digestive tract: Secondary | ICD-10-CM | POA: Diagnosis present

## 2020-05-11 DIAGNOSIS — Z7984 Long term (current) use of oral hypoglycemic drugs: Secondary | ICD-10-CM | POA: Diagnosis not present

## 2020-05-11 DIAGNOSIS — Z880 Allergy status to penicillin: Secondary | ICD-10-CM | POA: Diagnosis not present

## 2020-05-11 DIAGNOSIS — Z7952 Long term (current) use of systemic steroids: Secondary | ICD-10-CM | POA: Insufficient documentation

## 2020-05-11 HISTORY — DX: Motion sickness, initial encounter: T75.3XXA

## 2020-05-11 HISTORY — PX: ESOPHAGOGASTRODUODENOSCOPY (EGD) WITH PROPOFOL: SHX5813

## 2020-05-11 SURGERY — ESOPHAGOGASTRODUODENOSCOPY (EGD) WITH PROPOFOL
Anesthesia: General

## 2020-05-11 SURGERY — ESOPHAGOGASTRODUODENOSCOPY (EGD) WITH PROPOFOL
Anesthesia: Choice

## 2020-05-11 MED ORDER — PROPOFOL 10 MG/ML IV BOLUS
INTRAVENOUS | Status: DC | PRN
  Administered 2020-05-11: 30 mg via INTRAVENOUS
  Administered 2020-05-11: 120 mg via INTRAVENOUS
  Administered 2020-05-11: 40 mg via INTRAVENOUS
  Administered 2020-05-11: 50 mg via INTRAVENOUS
  Administered 2020-05-11: 30 mg via INTRAVENOUS

## 2020-05-11 MED ORDER — PROPOFOL 10 MG/ML IV BOLUS
INTRAVENOUS | Status: AC
  Filled 2020-05-11: qty 40

## 2020-05-11 MED ORDER — LIDOCAINE HCL (CARDIAC) PF 100 MG/5ML IV SOSY
PREFILLED_SYRINGE | INTRAVENOUS | Status: DC | PRN
  Administered 2020-05-11: 100 mg via INTRAVENOUS

## 2020-05-11 MED ORDER — SODIUM CHLORIDE 0.9 % IV SOLN: INTRAVENOUS | Status: DC

## 2020-05-11 MED ORDER — OMEPRAZOLE 20 MG PO CPDR: 20.0000 mg | DELAYED_RELEASE_CAPSULE | Freq: Every day | ORAL | 0 refills | Status: DC

## 2020-05-11 NOTE — Op Note (Signed)
[facility] Patient Name: Gwendolyn [name] Procedure Date: 05/11/2020 10:32 AM MRN: [id number] Account #: [id number] Date of Birth: March 26, 1971 Admit Type: Outpatient Age: 50 Room: [facility] ENDO ROOM 3 Gender: Female Note Status: Finalized Procedure:             Upper GI endoscopy Indications:           Abdominal pain, Abnormal CT of the GI tract, Follow-up                         of esophagitis Providers:             Crandall Harvel B. Bonna Gains MD, MD Referring MD:          Deborra Medina, MD (Referring MD) Medicines:             Monitored Anesthesia Care Complications:         No immediate complications. Procedure:             Pre-Anesthesia Assessment:                        - Prior to the procedure, a History and Physical was                         performed, and patient medications, allergies and                         sensitivities were reviewed. The patient's tolerance                         of previous anesthesia was reviewed.                        - The risks and benefits of the procedure and the                         sedation options and risks were discussed with the                         patient. All questions were answered and informed                         consent was obtained.                        - Patient identification and proposed procedure were                         verified prior to the procedure by the physician, the                         nurse, the anesthesiologist, the anesthetist and the                         technician. The procedure was verified in the                         procedure room.                        - ASA Grade Assessment:  II - A patient with mild                         systemic disease.                        After obtaining informed consent, the endoscope was                         passed under direct vision. Throughout the procedure,                         the patient's blood pressure,  pulse, and oxygen                         saturations were monitored continuously. The Endoscope                         was introduced through the mouth, and advanced to the                         second part of duodenum. The upper GI endoscopy was                         accomplished with ease. The patient tolerated the                         procedure well. Findings:      White nummular lesions were noted in the mid esophagus. Biopsies were       obtained from the proximal and distal esophagus with cold forceps for       histology of suspected eosinophilic esophagitis.      LA Grade A (one or more mucosal breaks less than 5 mm, not extending       between tops of 2 mucosal folds) esophagitis with no bleeding was found       at the gastroesophageal junction.      The exam of the esophagus was otherwise normal.      Patchy mildly erythematous mucosa without bleeding was found in the       gastric antrum. Biopsies were taken with a cold forceps for histology.       Biopsies were obtained in the gastric body, at the incisura and in the       gastric antrum with cold forceps for histology.      A small hiatal hernia was present.      The exam of the stomach was otherwise normal.      The duodenal bulb, second portion of the duodenum and examined duodenum       were normal. Impression:            - White nummular lesions in esophageal mucosa.                         Biopsied.                        - LA Grade A reflux esophagitis with no bleeding.                        - Erythematous mucosa in the antrum.  Biopsied.                        - Small hiatal hernia.                        - Normal duodenal bulb, second portion of the duodenum                         and examined duodenum.                        - Biopsies were obtained in the gastric body, at the                         incisura and in the gastric antrum. Recommendation:        - Await pathology results.                         - Discharge patient to home (with escort).                        - Advance diet as tolerated.                        - Continue present medications.                        - Patient has a contact number available for                         emergencies. The signs and symptoms of potential                         delayed complications were discussed with the patient.                         Return to normal activities tomorrow. Written                         discharge instructions were provided to the patient.                        - Discharge patient to home (with escort).                        - The findings and recommendations were discussed with                         the patient.                        - The findings and recommendations were discussed with                         the patient's family.                        - Take prescribed proton pump inhibitor or H2 blocker                         (  antacid) medications 30 - 60 minutes before meals. Procedure Code(s):     --- Professional ---                        [phone], Esophagogastroduodenoscopy, flexible,                         transoral; with biopsy, single or multiple Diagnosis Code(s):     --- Professional ---                        K22.8, Other specified diseases of esophagus                        K21.00, Gastro-esophageal reflux disease with                         esophagitis, without bleeding                        K31.89, Other diseases of stomach and duodenum                        K44.9, Diaphragmatic hernia without obstruction or                         gangrene                        R10.9, Unspecified abdominal pain                        R93.3, Abnormal findings on diagnostic imaging of                         other parts of digestive tract CPT copyright 2019 [employer]. All rights reserved. The codes documented in this report are preliminary and upon coder review may  be revised to  meet current compliance requirements.  Vonda Antigua, MD Margretta Sidle B. Bonna Gains MD, MD 05/11/2020 10:59:11 AM This report has been signed electronically. Number of Addenda: 0 Note Initiated On: 05/11/2020 10:32 AM Estimated Blood Loss:  Estimated blood loss: none.      [employer]

## 2020-05-11 NOTE — H&P (Signed)
Vonda Antigua, MD [address], [place], [place], [place], [address] [address], [place], [place], [place], [address] Phone: [phone]  Fax: [phone]  Primary Care Physician:  [name], MD   Pre-Procedure History & Physical: HPI:  Gwendolyn [name] is a 50 y.o. female is here for an EGD.   Past Medical History:  Diagnosis Date  . Anal fissure   . Anxiety   . Breast cancer ([place]) 2002   lumpectomy-Right  . Depression   . Family history of brain cancer   . Family history of colon cancer   . Fibromyalgia   . Gallstones   . Irritable bowel syndrome   . Motion sickness    cars, boats  . Personal history of radiation therapy   . Yeast vaginitis     Past Surgical History:  Procedure Laterality Date  . ABDOMINAL HYSTERECTOMY    . 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 BREAST BIOPSY    . BREAST LUMPECTOMY Right 2003   Radiatiomn Therapy DCIS  . CHOLECYSTECTOMY  2000  . COLECTOMY  10/31/2012   SECONDARY TO COLONIC INERTIA  . REDUCTION MAMMAPLASTY Bilateral 11/13/2018   right breast 15mm DCIS found during breast reduction  . SUPRACERVICAL ABDOMINAL HYSTERECTOMY  04/10/2011   But still has ovaries.    Prior to Admission medications   Medication Sig Start Date End Date Taking? Authorizing Provider  amphetamine-dextroamphetamine (ADDERALL) 30 MG tablet Take 30 mg by mouth 2 (two) times daily.  11/02/16  Yes [provider]  APPLE CIDER VINEGAR PO Take 1,600 mg by mouth daily.   Yes [provider]  Ashwagandha 500 MG CAPS Take 3,000 mg by mouth at bedtime.   Yes [provider]  Cranberry 1000 MG CAPS Take 30,000 mg by mouth at bedtime.   Yes [provider]  Cyanocobalamin (VITAMIN B-12) 5000 MCG TBDP Take by mouth.   Yes [provider]  Ferrous Sulfate (IRON PO) Take 25 mg by mouth.   Yes [provider]  FIBER PO Take by mouth daily.   Yes [provider]  gabapentin  (NEURONTIN) 800 MG tablet Take 1 tablet (800 mg total) by mouth 3 (three) times daily as needed (nerve pain). 03/31/20  Yes Gregor Hams, MD  Ginkgo Biloba 40 MG TABS Take by mouth.   Yes [provider]  Glutamine 500 MG TABS Take 1,000 mg by mouth in the morning and at bedtime.   Yes [provider]  Melatonin 3 MG TABS Take 1 tablet by mouth at bedtime.   Yes [provider]  meloxicam (MOBIC) 15 MG tablet TAKE 1 TABLET BY MOUTH EVERY DAY 01/27/20  Yes [name], MD  metFORMIN (GLUCOPHAGE-XR) 500 MG 24 hr tablet TAKE 1 TABLET BY MOUTH EVERY DAY WITH BREAKFAST 12/17/19  Yes [name], MD  oxyCODONE-acetaminophen (PERCOCET) 5-325 MG tablet Take 1 tablet by mouth every 6 (six) hours as needed for severe pain. 04/25/20 04/25/21 Yes Carrie Mew, MD  predniSONE (DELTASONE) 50 MG tablet Take 1 tablet (50 mg total) by mouth daily. 04/27/20  Yes Gregor Hams, MD  Probiotic Product (PROBIOTIC PO) Take by mouth.   Yes [provider]  tiaGABine (GABITRIL) 4 MG tablet Takes 4mg  in the AM and 12mg  at bedtime   Yes [provider]  trimethoprim (TRIMPEX) 100 MG tablet Take 1 tablet (100 mg total) by mouth as needed (post-coital prophylaxis to prevent UTIs). 05/19/19  Yes Sninsky,  Herbert Seta, MD  varenicline (CHANTIX) 1 MG tablet Take 1 tablet (1 mg total) by mouth 2 (two) times daily. 04/26/20  Yes [name], MD  zolpidem (AMBIEN) 10 MG tablet Take 1 tablet (10 mg total) by mouth at bedtime as needed for sleep. 12/02/17  Yes [name], MD  clotrimazole-betamethasone (LOTRISONE) cream Apply 1 application topically 2 (two) times daily. Patient not taking: Reported on 05/05/2020 10/19/19   [name], MD  dicyclomine (BENTYL) 10 MG capsule Take 1 capsule (10 mg total) by mouth 4 (four) times daily -  before meals and at bedtime. Patient not taking: Reported on 05/05/2020 04/27/20   Gregor Hams, MD  DULoxetine HCl 40 MG CPEP Take 2 capsules by mouth  every morning. Patient not taking: Reported on 05/11/2020 03/30/19   [provider]  metroNIDAZOLE (METROGEL VAGINAL) 0.75 % vaginal gel Place 1 Applicatorful vaginally 2 (two) times daily. Patient not taking: No sig reported 11/24/19   Pleas Koch, NP  ondansetron (ZOFRAN ODT) 4 MG disintegrating tablet Take 1 tablet (4 mg total) by mouth every 8 (eight) hours as needed for nausea or vomiting. Patient not taking: No sig reported 04/25/20   Carrie Mew, MD  sertraline (ZOLOFT) 25 MG tablet Take 25 mg by mouth daily. Patient not taking: No sig reported 10/12/19   [provider]    Allergies as of 05/04/2020 - Review Complete 05/04/2020  Allergen Reaction Noted  . Amoxicillin-pot clavulanate  10/05/2014  . Morphine Other (See Comments) 10/05/2014  . Penicillins  10/06/2014    Family History  Problem Relation Age of Onset  . Meniere's disease Father   . Alcohol abuse Sister   . Heart disease Maternal Grandmother   . Heart disease Maternal Grandfather   . Brain cancer Maternal Grandfather   . Heart disease Paternal Grandmother   . Heart disease Paternal Grandfather   . Rectal cancer Other        MGF's mother  . Colon cancer Neg Hx   . Esophageal cancer Neg Hx   . Breast cancer Neg Hx     Social History   Socioeconomic History  . Marital status: Married    Spouse name: Not on file  . Number of children: 2  . Years of education: Not on file  . Highest education level: Not on file  Occupational History  . Occupation: Pharmacist, hospital  Tobacco Use  . Smoking status: Former Smoker    Packs/day: 0.50    Years: 20.00    Pack years: 10.00    Types: Cigarettes  . Smokeless tobacco: Never Used  . Tobacco comment: smoked 10 yrs, quit 20 yrs, smoked last 4 yrs  Vaping Use  . Vaping Use: Never used  Substance and Sexual Activity  . Alcohol use: Yes    Alcohol/week: 0.0 standard drinks    Comment: OCCASIONALLY  . Drug use: No  . Sexual activity: Yes    Birth  control/protection: Surgical  Other Topics Concern  . Not on file  Social History Narrative   Married from [place] Strain: Not on file  Food Insecurity: Not on file  Transportation Needs: Not on file  Physical Activity: Not on file  Stress: Not on file  Social Connections: Not on file  Intimate Partner Violence: Not on file    Review of Systems: See HPI, otherwise negative ROS  Physical Exam: BP 102/67   Pulse 77  Temp (!) 96.1 F (35.6 C) (Temporal)   Resp 18   Ht 5\' 7"  (1.702 m)   Wt 81.6 kg   SpO2 98%   BMI 28.19 kg/m  General:   Alert,  pleasant and cooperative in NAD Head:  Normocephalic and atraumatic. Neck:  Supple; no masses or thyromegaly. Lungs:  Clear throughout to auscultation, normal respiratory effort.    Heart:  +S1, +S2, Regular rate and rhythm, No edema. Abdomen:  Soft, nontender and nondistended. Normal bowel sounds, without guarding, and without rebound.   Neurologic:  Alert and  oriented x4;  grossly normal neurologically.  Impression/Plan: Gwendolyn [name] is here for an EGD for esophageal thickening on CT  Risks, benefits, limitations, and alternatives regarding the procedure have been reviewed with the patient.  Questions have been answered.  All parties agreeable.   Virgel Manifold, MD  05/11/2020, 9:26 AM

## 2020-05-11 NOTE — Anesthesia Preprocedure Evaluation (Addendum)
Anesthesia Evaluation  Patient identified by MRN, date of birth, ID band Patient awake    Reviewed: Allergy & Precautions, H&P , NPO status , Patient's Chart, lab work & pertinent test results  History of Anesthesia Complications Negative for: history of anesthetic complications  Airway Mallampati: II  TM Distance: >3 FB Neck ROM: full    Dental  (+) Teeth Intact   Pulmonary sleep apnea , neg COPD, Current Smoker, former smoker,    breath sounds clear to auscultation       Cardiovascular (-) angina(-) Past MI and (-) Cardiac Stents negative cardio ROS  (-) dysrhythmias  Rhythm:regular Rate:Normal     Neuro/Psych  Headaches, PSYCHIATRIC DISORDERS Anxiety Depression Bipolar Disorder    GI/Hepatic negative GI ROS, Neg liver ROS, S/p colectomy   Endo/Other  negative endocrine ROS  Renal/GU Renal disease  negative genitourinary   Musculoskeletal  (+) Fibromyalgia -  Abdominal   Peds  Hematology negative hematology ROS (+)   Anesthesia Other Findings Past Medical History: No date: Anal fissure No date: Anxiety 2002: Breast cancer ([place])     Comment:  lumpectomy-Right No date: Depression No date: Family history of brain cancer No date: Family history of colon cancer No date: Fibromyalgia No date: Gallstones No date: Irritable bowel syndrome No date: Motion sickness     Comment:  cars, boats No date: Personal history of radiation therapy No date: Yeast vaginitis  Past Surgical History: No date: ABDOMINAL HYSTERECTOMY 2002: BREAST BIOPSY; Right     Comment:  Breast cancer DCIS No date: BREAST BIOPSY 2003: BREAST LUMPECTOMY; Right     Comment:  Radiatiomn Therapy DCIS 2000: CHOLECYSTECTOMY 10/31/2012: COLECTOMY     Comment:  SECONDARY TO COLONIC INERTIA 11/13/2018: REDUCTION MAMMAPLASTY; Bilateral     Comment:  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  BMI    Body Mass Index: 28.82 kg/m      Reproductive/Obstetrics negative OB ROS                            Anesthesia Physical Anesthesia Plan  ASA: II  Anesthesia Plan: General   Post-op Pain Management:    Induction:   PONV Risk Score and Plan: Propofol infusion and TIVA  Airway Management Planned: Nasal Cannula  Additional Equipment:   Intra-op Plan:   Post-operative Plan:   Informed Consent: I have reviewed the patients History and Physical, chart, labs and discussed the procedure including the risks, benefits and alternatives for the proposed anesthesia with the patient or authorized representative who has indicated his/her understanding and acceptance.     Dental Advisory Given  Plan Discussed with: Anesthesiologist, CRNA and Surgeon  Anesthesia Plan Comments:         Anesthesia Quick Evaluation

## 2020-05-11 NOTE — Transfer of Care (Signed)
Immediate Anesthesia Transfer of Care Note  Patient: Gwendolyn [name]  Procedure(s) Performed: ESOPHAGOGASTRODUODENOSCOPY (EGD) WITH PROPOFOL (N/A )  Patient Location: PACU  Anesthesia Type:MAC  Level of Consciousness: awake, alert  and oriented  Airway & Oxygen Therapy: Patient Spontanous Breathing  Post-op Assessment: Report given to RN and Post -op Vital signs reviewed and stable  Post vital signs: stable  Last Vitals:  Vitals Value Taken Time  BP 101/70 05/11/20 1059  Temp 36.3 C 05/11/20 1059  Pulse 78 05/11/20 1100  Resp 15 05/11/20 1100  SpO2 100 % 05/11/20 1100  Vitals shown include unvalidated device data.  Last Pain:  Vitals:   05/11/20 1059  TempSrc:   PainSc: 0-No pain         Complications: No complications documented.

## 2020-05-12 ENCOUNTER — Encounter: Payer: Self-pay | Admitting: Gastroenterology

## 2020-05-12 LAB — SURGICAL PATHOLOGY

## 2020-05-12 NOTE — Anesthesia Postprocedure Evaluation (Signed)
Anesthesia Post Note  Patient: Gwendolyn [name]  Procedure(s) Performed: ESOPHAGOGASTRODUODENOSCOPY (EGD) WITH PROPOFOL (N/A )  Patient location during evaluation: PACU Anesthesia Type: General Level of consciousness: awake and alert Pain management: pain level controlled Vital Signs Assessment: post-procedure vital signs reviewed and stable Respiratory status: spontaneous breathing, nonlabored ventilation and respiratory function stable Cardiovascular status: blood pressure returned to baseline and stable Postop Assessment: no apparent nausea or vomiting Anesthetic complications: no   No complications documented.   Last Vitals:  Vitals:   05/11/20 1059 05/11/20 1109  BP: 101/70 (!) 96/59  Pulse: 77 72  Resp: 16 (!) 21  Temp: (!) 36.3 C   SpO2: 98% 99%    Last Pain:  Vitals:   05/12/20 0727  TempSrc:   PainSc: 0-No pain                 Tera Mater

## 2020-05-19 ENCOUNTER — Other Ambulatory Visit: Payer: Self-pay | Admitting: Family Medicine

## 2020-05-19 NOTE — Telephone Encounter (Signed)
Please advise 

## 2020-05-24 ENCOUNTER — Encounter: Payer: Self-pay | Admitting: Internal Medicine

## 2020-05-24 ENCOUNTER — Ambulatory Visit: Payer: BC Managed Care – PPO | Admitting: Internal Medicine

## 2020-05-24 ENCOUNTER — Other Ambulatory Visit: Payer: Self-pay

## 2020-05-24 VITALS — BP 93/63 | HR 91 | Temp 98.8°F | Ht 67.0 in | Wt 184.8 lb

## 2020-05-24 DIAGNOSIS — W19XXXD Unspecified fall, subsequent encounter: Secondary | ICD-10-CM | POA: Diagnosis not present

## 2020-05-24 DIAGNOSIS — Z853 Personal history of malignant neoplasm of breast: Secondary | ICD-10-CM

## 2020-05-24 DIAGNOSIS — S0990XA Unspecified injury of head, initial encounter: Secondary | ICD-10-CM | POA: Diagnosis not present

## 2020-05-24 DIAGNOSIS — R251 Tremor, unspecified: Secondary | ICD-10-CM

## 2020-05-24 DIAGNOSIS — R29898 Other symptoms and signs involving the musculoskeletal system: Secondary | ICD-10-CM

## 2020-05-24 DIAGNOSIS — R292 Abnormal reflex: Secondary | ICD-10-CM

## 2020-05-24 DIAGNOSIS — R410 Disorientation, unspecified: Secondary | ICD-10-CM

## 2020-05-24 NOTE — Patient Instructions (Addendum)
MRI sch 05/25/20 [facility] 11:30 am   Neck Exercises Ask your health care provider which exercises are safe for you. Do exercises exactly as told by your health care provider and adjust them as directed. It is normal to feel mild stretching, pulling, tightness, or discomfort as you do these exercises. Stop right away if you feel sudden pain or your pain gets worse. Do not begin these exercises until told by your health care provider. Neck exercises can be important for many reasons. They can improve strength and maintain flexibility in your neck, which will help your upper back and prevent neck pain. Stretching exercises Rotation neck stretching 1. Sit in a chair or stand up. 2. Place your feet flat on the floor, shoulder width apart. 3. Slowly turn your head (rotate) to the right until a slight stretch is felt. Turn it all the way to the right so you can look over your right shoulder. Do not tilt or tip your head. 4. Hold this position for 10-30 seconds. 5. Slowly turn your head (rotate) to the left until a slight stretch is felt. Turn it all the way to the left so you can look over your left shoulder. Do not tilt or tip your head. 6. Hold this position for 10-30 seconds. Repeat __________ times. Complete this exercise __________ times a day.   Neck retraction 1. Sit in a sturdy chair or stand up. 2. Look straight ahead. Do not bend your neck. 3. Use your fingers to push your chin backward (retraction). Do not bend your neck for this movement. Continue to face straight ahead. If you are doing the exercise properly, you will feel a slight sensation in your throat and a stretch at the back of your neck. 4. Hold the stretch for 1-2 seconds. Repeat __________ times. Complete this exercise __________ times a day. Strengthening exercises Neck press 1. Lie on your back on a firm bed or on the floor with a pillow under your head. 2. Use your neck muscles to push your head down on the  pillow and straighten your spine. 3. Hold the position as well as you can. Keep your head facing up (in a neutral position) and your chin tucked. 4. Slowly count to 5 while holding this position. Repeat __________ times. Complete this exercise __________ times a day. Isometrics These are exercises in which you strengthen the muscles in your neck while keeping your neck still (isometrics). 1. Sit in a supportive chair and place your hand on your forehead. 2. Keep your head and face facing straight ahead. Do not flex or extend your neck while doing isometrics. 3. Push forward with your head and neck while pushing back with your hand. Hold for 10 seconds. 4. Do the sequence again, this time putting your hand against the back of your head. Use your head and neck to push backward against the hand pressure. 5. Finally, do the same exercise on either side of your head, pushing sideways against the pressure of your hand. Repeat __________ times. Complete this exercise __________ times a day. Prone head lifts 1. Lie face-down (prone position), resting on your elbows so that your chest and upper back are raised. 2. Start with your head facing downward, near your chest. Position your chin either on or near your chest. 3. Slowly lift your head upward. Lift until you are looking straight ahead. Then continue lifting your head as far back as you can comfortably stretch. 4. Hold your head up for 5 seconds.  Then slowly lower it to your starting position. Repeat __________ times. Complete this exercise __________ times a day. Supine head lifts 1. Lie on your back (supine position), bending your knees to point to the ceiling and keeping your feet flat on the floor. 2. Lift your head slowly off the floor, raising your chin toward your chest. 3. Hold for 5 seconds. Repeat __________ times. Complete this exercise __________ times a day. Scapular retraction 1. Stand with your arms at your sides. Look straight  ahead. 2. Slowly pull both shoulders (scapulae) backward and downward (retraction) until you feel a stretch between your shoulder blades in your upper back. 3. Hold for 10-30 seconds. 4. Relax and repeat. Repeat __________ times. Complete this exercise __________ times a day. Contact a health care provider if:  Your neck pain or discomfort gets much worse when you do an exercise.  Your neck pain or discomfort does not improve within 2 hours after you exercise. If you have any of these problems, stop exercising right away. Do not do the exercises again unless your health care provider says that you can. Get help right away if:  You develop sudden, severe neck pain. If this happens, stop exercising right away. Do not do the exercises again unless your health care provider says that you can. This information is not intended to replace advice given to you by your health care provider. Make sure you discuss any questions you have with your health care provider. Document Revised: 02/05/2018 Document Reviewed: 02/05/2018 Elsevier Patient Education  2021 [employer].

## 2020-05-24 NOTE — Progress Notes (Signed)
Chief Complaint  Patient presents with  . Fall  . Extremity Weakness   F/u with husband  1. Sleep walking x 2 weeks Since 05/23/20 having episodes 1:30 am sleep walking with witnessed by her husband legs giving out and 5 am this am legs giving out and falling to hit head but this was not witness by her husband. She denies LOC but is having leg weakness confusion, neck pain mild multiple bruises arms and trunk due to fall  She has been on Ambien 10 mg x 15 years but today this was stopped by Dr. Nicolasa Ducking. New med changes she was recently started on gabapentin from 800 mg qhs to tid by Dr. Edilia Bo for "pinched nerve" in right neck and chantix 1 04/26/20 by PCP ambein and gabapentin monitor RR, chantix and gabapentin reduces seizures threshold. She has been having right arm numbness  She c/o tremor in upper and lower extremities, confusion  DDI Chantix and Lorrin Mais will hold both for now  Denies h/o seizures   MRI cervical 04/2020  Disc levels:  C2-3: Small central disc protrusion.  Negative for stenosis  C3-4: Small central disc protrusion.  Negative for stenosis  C4-5: Small central disc protrusion.  Negative for stenosis  C5-6: Small central disc protrusion.  Negative for stenosis  C6-7: Small central disc protrusion and mild uncinate spurring. No significant spinal or foraminal stenosis  C7-T1: Negative  IMPRESSION: Mild multilevel degenerative change in the cervical spine. Negative for neural impingement   Electronically Signed   By: Franchot Gallo M.D.   On: 04/24/2020 16:42  FINDINGS:  Vertebral body height and alignment are normal. Small hemangioma in  L2 is noted. Mild convex right scoliosis is seen. No pars  interarticularis defect is identified. The conus medullaris is  normal in signal and position. Imaged intra-abdominal contents are  unremarkable.   The T11-12 level is imaged in the sagittal plane only and negative.   T12-L1:  Negative.   L1-2:   Negative.   L2-3:  Negative.   L3-4: Minimal disc bulge without central canal or foraminal  narrowing.   L4-5: Very shallow disc bulge without central canal or foraminal  narrowing.   L5-S1: Shallow disc bulge without central canal or foraminal  narrowing.   IMPRESSION:  Mild lower lumbar degenerative disc disease without central canal or  foraminal narrowing. No abnormality to explain the patient's  symptoms is identified.   Mild convex right scoliosis.    Electronically Signed    By: Inge Rise M.D.    On: 06/01/2014 15:55   FINDINGS:  No evidence for acute infarction, hemorrhage, mass lesion,  hydrocephalus, or extra-axial fluid. Normal cerebral volume. No  appreciable white matter disease. Sagittal FLAIR imaging  demonstrates no periventricular or corpus callosum lesions  concerning for demyelinating disease. Flow voids are maintained  throughout the carotid, basilar, and vertebral arteries. There are  no areas of chronic hemorrhage.   Pituitary, pineal, and cerebellar tonsils unremarkable. No upper  cervical lesions. Visualized calvarium, skull base, and upper  cervical osseous structures unremarkable. Scalp and extracranial  soft tissues, orbits, sinuses, and mastoids show no acute process.   Post infusion, no abnormal enhancement of the brain or meninges.   IMPRESSION:  Negative exam. No acute or focal intracranial abnormality. No  abnormal postcontrast enhancement.   Within limits of evaluation of the orbits on routine brain MR, no  focal abnormality is evident.    Electronically Signed    By: Michae Kava.D.  On: 07/13/2014 14:15     Review of Systems  Constitutional: Negative for weight loss.  Musculoskeletal: Positive for falls.  Skin: Negative for rash.  Neurological: Positive for tremors and loss of consciousness. Negative for seizures and headaches.  Psychiatric/Behavioral:       +confusion  +sleep walking    Past  Medical History:  Diagnosis Date  . Anal fissure   . Anxiety   . Breast cancer ([place]) 2002   lumpectomy-Right  . Depression   . Family history of brain cancer   . Family history of colon cancer   . Fibromyalgia   . Gallstones   . Irritable bowel syndrome   . Motion sickness    cars, boats  . Personal history of radiation therapy   . Yeast vaginitis    Past Surgical History:  Procedure Laterality Date  . ABDOMINAL HYSTERECTOMY    . 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 BREAST BIOPSY    . BREAST LUMPECTOMY Right 2003   Radiatiomn Therapy DCIS  . CHOLECYSTECTOMY  2000  . COLECTOMY  10/31/2012   SECONDARY TO COLONIC INERTIA  . ESOPHAGOGASTRODUODENOSCOPY (EGD) WITH PROPOFOL N/A 05/11/2020   Procedure: ESOPHAGOGASTRODUODENOSCOPY (EGD) WITH PROPOFOL;  Surgeon: Virgel Manifold, MD;  Location: [facility] ENDOSCOPY;  Service: Endoscopy;  Laterality: N/A;  . 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 SUPRACERVICAL ABDOMINAL HYSTERECTOMY  04/10/2011   But still has ovaries.   Family History  Problem Relation Age of Onset  . Meniere's disease Father   . Alcohol abuse Sister   . Heart disease Maternal Grandmother   . Heart disease Maternal Grandfather   . Brain cancer Maternal Grandfather   . Heart disease Paternal Grandmother   . Heart disease Paternal Grandfather   . Rectal cancer Other        MGF's mother  . Colon cancer Neg Hx   . Esophageal cancer Neg Hx   . Breast cancer Neg Hx    Social History   Socioeconomic History  . Marital status: Married    Spouse name: Not on file  . Number of children: 2  . Years of education: Not on file  . Highest education level: Not on file  Occupational History  . Occupation: Pharmacist, hospital  Tobacco Use  . Smoking status: Light Tobacco Smoker    Packs/day: 0.50    Years: 20.00    Pack years: 10.00    Types: Cigarettes  . Smokeless tobacco: Never Used  . Tobacco comment:  smoked 10 yrs, quit 20 yrs, smoked last 4 yrs  Vaping Use  . Vaping Use: Never used  Substance and Sexual Activity  . Alcohol use: Yes    Alcohol/week: 0.0 standard drinks    Comment: OCCASIONALLY  . Drug use: No  . Sexual activity: Yes    Birth control/protection: Surgical  Other Topics Concern  . Not on file  Social History Narrative   Married from [place] Strain: Not on file  Food Insecurity: Not on file  Transportation Needs: Not on file  Physical Activity: Not on file  Stress: Not on file  Social Connections: Not on file  Intimate Partner Violence: Not on file   Current Meds  Medication Sig  . amphetamine-dextroamphetamine (ADDERALL) 30 MG tablet Take 30 mg by mouth 2 (two) times daily.   . APPLE CIDER VINEGAR PO Take 1,600 mg by mouth  daily.  . Ashwagandha 500 MG CAPS Take 3,000 mg by mouth at bedtime.  . Cranberry 1000 MG CAPS Take 30,000 mg by mouth at bedtime.  . Cyanocobalamin (VITAMIN B-12) 5000 MCG TBDP Take by mouth.  . DULoxetine HCl 40 MG CPEP Take 2 capsules by mouth every morning.  . Ferrous Sulfate (IRON PO) Take 25 mg by mouth.  . FIBER PO Take by mouth daily.  Marland Kitchen gabapentin (NEURONTIN) 800 MG tablet Take 1 tablet (800 mg total) by mouth 3 (three) times daily as needed (nerve pain).  . Ginkgo Biloba 40 MG TABS Take by mouth.  . Glutamine 500 MG TABS Take 1,000 mg by mouth in the morning and at bedtime.  . Melatonin 3 MG TABS Take 1 tablet by mouth at bedtime.  . meloxicam (MOBIC) 15 MG tablet TAKE 1 TABLET BY MOUTH EVERY DAY  . metFORMIN (GLUCOPHAGE-XR) 500 MG 24 hr tablet TAKE 1 TABLET BY MOUTH EVERY DAY WITH BREAKFAST  . omeprazole (PRILOSEC) 20 MG capsule Take 1 capsule (20 mg total) by mouth daily.  . Probiotic Product (PROBIOTIC PO) Take by mouth.  . tiaGABine (GABITRIL) 4 MG tablet Takes 4mg  in the AM and 12mg  at bedtime  . varenicline (CHANTIX) 1 MG tablet Take 1 tablet (1 mg total) by mouth 2  (two) times daily.  Marland Kitchen zolpidem (AMBIEN) 10 MG tablet Take 1 tablet (10 mg total) by mouth at bedtime as needed for sleep.   Allergies  Allergen Reactions  . Amoxicillin-Pot Clavulanate     Other reaction(s): NAUSEA (Augmentin)  . Morphine Diarrhea and Nausea Only  . Penicillins     nausea, diarrhea   Recent Results (from the past 2160 hour(s))  Lipase, blood     Status: None   Collection Time: 04/25/20  5:45 PM  Result Value Ref Range   Lipase 47 11 - 51 U/L    Comment: Performed at [facility], [address] [place], [place] [address]  Comprehensive metabolic panel     Status: Abnormal   Collection Time: 04/25/20  5:45 PM  Result Value Ref Range   Sodium 139 135 - 145 mmol/L   Potassium 3.9 3.5 - 5.1 mmol/L   Chloride 100 98 - 111 mmol/L   CO2 29 22 - 32 mmol/L   Glucose, Bld 105 (H) 70 - 99 mg/dL    Comment: Glucose reference range applies only to samples taken after fasting for at least 8 hours.   BUN 12 6 - 20 mg/dL   Creatinine, Ser 0.73 0.44 - 1.00 mg/dL   Calcium 8.6 (L) 8.9 - 10.3 mg/dL   Total Protein 6.7 6.5 - 8.1 g/dL   Albumin 3.6 3.5 - 5.0 g/dL   AST 17 15 - 41 U/L   ALT 25 0 - 44 U/L   Alkaline Phosphatase 32 (L) 38 - 126 U/L   Total Bilirubin 0.7 0.3 - 1.2 mg/dL   GFR, Estimated >60 >60 mL/min    Comment: (NOTE) Calculated using the CKD-EPI Creatinine Equation (2021)    Anion gap 10 5 - 15    Comment: Performed at [facility], [address] [place], [place] [address]  CBC     Status: Abnormal   Collection Time: 04/25/20  5:45 PM  Result Value Ref Range   WBC 11.8 (H) 4.0 - 10.5 K/uL   RBC 4.32 3.87 - 5.11 MIL/uL   Hemoglobin 13.6 12.0 - 15.0 g/dL   HCT 40.7 36.0 - 46.0 %   MCV  94.2 80.0 - 100.0 fL   MCH 31.5 26.0 - 34.0 pg   MCHC 33.4 30.0 - 36.0 g/dL   RDW 12.8 11.5 - 15.5 %   Platelets 351 150 - 400 K/uL   nRBC 0.0 0.0 - 0.2 %    Comment: Performed at [facility], [address] [place], [place] [address]   SARS CORONAVIRUS 2 (TAT 6-24 HRS) Nasopharyngeal Nasopharyngeal Swab     Status: None   Collection Time: 05/05/20  1:38 PM   Specimen: Nasopharyngeal Swab  Result Value Ref Range   SARS Coronavirus 2 NEGATIVE NEGATIVE    Comment: (NOTE) SARS-CoV-2 target nucleic acids are NOT DETECTED.  The SARS-CoV-2 RNA is generally detectable in upper and lower respiratory specimens during the acute phase of infection. Negative results do not preclude SARS-CoV-2 infection, do not rule out co-infections with other pathogens, and should not be used as the sole basis for treatment or other patient management decisions. Negative results must be combined with clinical observations, patient history, and epidemiological information. The expected result is Negative.  Fact Sheet for Patients: SugarRoll.be  Fact Sheet for Healthcare Providers: https://www.woods-mathews.com/  This test is not yet approved or cleared by the [place] FDA and  has been authorized for detection and/or diagnosis of SARS-CoV-2 by FDA under an Emergency Use Authorization (EUA). This EUA will remain  in effect (meaning this test can be used) for the duration of the COVID-19 declaration under Se ction 564(b)(1) of the Act, 21 U.S.C. section 360bbb-3(b)(1), unless the authorization is terminated or revoked sooner.  Performed at [facility], [place] [address] [place], [place] [address]   Surgical pathology     Status: None   Collection Time: 05/11/20  9:50 AM  Result Value Ref Range   SURGICAL PATHOLOGY      SURGICAL PATHOLOGY CASE: [id number] PATIENT: [name] Surgical Pathology Report     Specimen Submitted: A. Stomach erythema; cbx B. Esophagus; cbx  Clinical History: Esophagitis K20.90.  Gastric erythema      DIAGNOSIS: A. STOMACH ERYTHEMA; COLD BIOPSY: - REACTIVE GASTROPATHY. - NEGATIVE FOR ACTIVE INFLAMMATION AND H PYLORI. - NEGATIVE  FOR INTESTINAL METAPLASIA, DYSPLASIA, AND MALIGNANCY.  B. ESOPHAGUS; COLD BIOPSY: - SQUAMOUS MUCOSA WITH NO SIGNIFICANT PATHOLOGIC ALTERATION. - NEGATIVE FOR INCREASED EOSINOPHILS AND FUNGAL ELEMENTS. - NEGATIVE FOR INTESTINAL METAPLASIA, DYSPLASIA, AND MALIGNANCY.  GROSS DESCRIPTION: A. Labeled: cbx gastric erythema to rule out H. pylori Received: Formalin Collection time: 9:50 AM on 05/11/2020 Placed into formalin time: 9:50 AM on 05/11/2020 Tissue fragment(s): 3 Size: Aggregate, 0.9 x 0.5 x 0.2 cm Description: Tan soft tissue fragments Entirely submitted in 1 cassette.  B. Labeled: cbx esophageal t o rule out EOE and Candida Received: Formalin Collection time: 10:50 AM on 05/11/2020 Placed into formalin time: 10:50 AM on 05/11/2020 Tissue fragment(s): Multiple Size: Aggregate, 1.2 x 0.4 x 0.1 cm Description: White-pink translucent soft tissue fragments Entirely submitted in 1 cassette.  Final Diagnosis performed by Betsy Pries, MD.   Electronically signed 05/12/2020 11:48:41AM The electronic signature indicates that the named Attending Pathologist has evaluated the specimen Technical component performed at [facility], [address], [place], [place] [address] Lab: [phone] Dir: Rush Farmer, MD, MMM  Professional component performed at [facility], [facility], [place], [place],  [address] Lab: [phone] Dir: Dellia Nims. Rubinas, MD    Objective  Body mass index is 28.94 kg/m. Wt Readings from Last 3 Encounters:  05/24/20 184 lb 12.8 oz (83.8 kg)  05/11/20 180 lb (81.6 kg)  05/04/20 184 lb (83.5 kg)   Temp Readings from Last 3 Encounters:  05/24/20 98.8 F (37.1 C) (Oral)  05/11/20 (!) 97.4 F (36.3 C)  05/04/20 98.3 F (36.8 C) (Oral)   BP Readings from Last 3 Encounters:  05/24/20 93/63  05/11/20 (!) 96/59  05/04/20 110/87   Pulse Readings from Last 3 Encounters:  05/24/20 91  05/11/20 72  05/04/20 87    Physical  Exam Vitals and nursing note reviewed.  Constitutional:      Appearance: Normal appearance. She is well-developed and well-groomed.  HENT:     Head: Normocephalic and atraumatic.  Eyes:     Conjunctiva/sclera: Conjunctivae normal.     Pupils: Pupils are equal, round, and reactive to light.  Cardiovascular:     Rate and Rhythm: Normal rate and regular rhythm.     Heart sounds: Normal heart sounds. No murmur heard.   Pulmonary:     Effort: Pulmonary effort is normal.     Breath sounds: Normal breath sounds.  Skin:    General: Skin is warm and dry.  Neurological:     General: No focal deficit present.     Mental Status: She is alert and oriented to person, place, and time. Mental status is at baseline.     Cranial Nerves: Cranial nerves are intact.     Sensory: Sensation is intact.     Motor: Weakness present.     Gait: Gait abnormal.     Deep Tendon Reflexes:     Reflex Scores:      Brachioradialis reflexes are 3+ on the right side and 3+ on the left side.      Patellar reflexes are 3+ on the right side and 3+ on the left side.    Comments: Lower extremity weakness new+  Abnormal reflexes hyper-reflexia upper and lower ext b/l    Psychiatric:        Attention and Perception: Attention and perception normal.        Mood and Affect: Mood and affect normal.        Speech: Speech normal.        Behavior: Behavior normal. Behavior is cooperative.        Thought Content: Thought content normal.        Cognition and Memory: Cognition and memory normal.        Judgment: Judgment normal.     Assessment  Plan  Weakness of both lower extremities with falls x 2 early am 05/24/20 hitting her head hyperreflexia (new), confusion, leg weakness, tremor (new) with h/o breast cancer r/o brain lesion and FH brain cancer- Plan: MR Brain W Wo Contrast Consider EEG r/o seizures, NCS/EMG with neurology have moved appt up from 06/15/20 to 05/25/20 at 1:30 pm Dr. Manuella Ghazi  Stop ambien 10 mg qhs, chantix  for now  F/u psych 05/24/20 at 4 PM  Spoke with Dr. Manuella Ghazi re pt to move appt up and above thought process   Provider: Dr. Olivia Mackie McLean-Scocuzza-Internal Medicine

## 2020-05-24 NOTE — Progress Notes (Deleted)
[place]   Telephone:(336) [phone] Fax:(336) [phone]   Clinic Follow up Note   Patient Care Team: [name], MD as PCP - General (Internal Medicine) Alphonsa Overall, MD as Consulting Physician (General Surgery) 05/24/2020  CHIEF COMPLAINT: Follow up right breast DCIS  SUMMARY OF ONCOLOGIC HISTORY: Oncology History Overview Note  Cancer Staging No matching staging information was found for the patient.    DCIS (ductal carcinoma in situ)  11/06/2018 Mammogram   Mammogram 11/06/18  IMPRESSION: No mammographic evidence of malignancy. A result letter of this screening mammogram will be mailed directly to the patient.   11/13/2018 Surgery   B/l Breast Reduction   11/13/2018 Pathology Results    Diagnosis 11/13/18 1. Breast, Mammoplasty, right tissue, 76g - DUCTAL CARCINOMA IN SITU. 0.4cm 2. Breast, Mammoplasty, left tissue, 212g - BENIGN BREAST TISSUE.  Estrogen Receptor: 95%, POSITIVE, STRONG STAINING INTENSITY Progesterone Receptor: 95%, POSITIVE, STRONG STAINING INTENSITY   12/06/2018 Initial Diagnosis   Ductal carcinoma in situ (DCIS) of right breast   01/16/2019 Genetic Testing   Negative genetic testing on the common hereditary cancer panel.  The Common Hereditary Gene Panel offered by [facility] includes sequencing and/or deletion duplication testing of the following 48 genes: APC, ATM, AXIN2, BARD1, BMPR1A, BRCA1, BRCA2, BRIP1, CDH1, CDK4, CDKN2A (p14ARF), CDKN2A (p16INK4a), CHEK2, CTNNA1, DICER1, EPCAM (Deletion/duplication testing only), GREM1 (promoter region deletion/duplication testing only), KIT, MEN1, MLH1, MSH2, MSH3, MSH6, MUTYH, NBN, NF1, NHTL1, PALB2, PDGFRA, PMS2, POLD1, POLE, PTEN, RAD50, RAD51C, RAD51D, RNF43, SDHB, SDHC, SDHD, SMAD4, SMARCA4. STK11, TP53, TSC1, TSC2, and VHL.  The following genes were evaluated for sequence changes only: SDHA and HOXB13 c.251G>A variant only. The report date is January 16, 2019.   02/24/2019 Breast MRI    IMPRESSION: 1. Indeterminate 6 mm enhancing mass in the upper outer left breast. Recommendation is for diagnostic evaluation with mammography and ultrasound. If there is no mammographic or sonographic correlate, recommendation is to proceed with MRI guided biopsy. 2. No suspicious MRI findings on the right.   03/02/2019 Breast US   Bilateral mammo/L br US IMPRESSION: Expected bilateral post reduction surgical scarring as described. No focal abnormality over the upper outer left breast to correlate to the recent MRI finding.   03/12/2019 Breast MRI   MRI Guided biopsy attempt IMPRESSION: The small mass in the upper-outer quadrant of the left breast which was the target of the intended MRI guided biopsy today is obscured by marked increase in background parenchymal enhancement. Therefore the biopsy was canceled, and will need to be rescheduled.   03/24/2019 Pathology Results   Diagnosis Breast, left, needle core biopsy, upper outer - FIBROCYSTIC CHANGES. - PRIOR PROCEDURE SITE CHANGES.   11/10/2019 Breast MRI   IMPRESSION: No MR evidence of breast malignancy. RECOMMENDATION: Bilateral diagnostic mammogram in 6 months to resume annual mammogram schedule in this patient with history of RIGHT DCIS in 2020     CURRENT THERAPY: Close surveillance, alternating every 6 month mammogram and breast MRI  INTERVAL HISTORY: Gwendolyn [name] returns for f/up as scheduled. She was last seen 11/18/19. She is overdue for mammogram.    REVIEW OF SYSTEMS:   Constitutional: Denies fevers, chills or abnormal weight loss Eyes: Denies blurriness of vision Ears, nose, mouth, throat, and face: Denies mucositis or sore throat Respiratory: Denies cough, dyspnea or wheezes Cardiovascular: Denies palpitation, chest discomfort or lower extremity swelling Gastrointestinal:  Denies nausea, heartburn or change in bowel habits Skin: Denies abnormal skin rashes Lymphatics: Denies new lymphadenopathy or  easy bruising Neurological:Denies numbness, tingling or new weaknesses Behavioral/Psych: Mood is stable, no new changes  All other systems were reviewed with the patient and are negative.  MEDICAL HISTORY:  Past Medical History:  Diagnosis Date  . Anal fissure   . Anxiety   . Breast cancer ([place]) 2002   lumpectomy-Right  . Depression   . Family history of brain cancer   . Family history of colon cancer   . Fibromyalgia   . Gallstones   . Irritable bowel syndrome   . Motion sickness    cars, boats  . Personal history of radiation therapy   . Yeast vaginitis     SURGICAL HISTORY: Past Surgical History:  Procedure Laterality Date  . ABDOMINAL HYSTERECTOMY    . 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 BREAST BIOPSY    . BREAST LUMPECTOMY Right 2003   Radiatiomn Therapy DCIS  . CHOLECYSTECTOMY  2000  . COLECTOMY  10/31/2012   SECONDARY TO COLONIC INERTIA  . ESOPHAGOGASTRODUODENOSCOPY (EGD) WITH PROPOFOL N/A 05/11/2020   Procedure: ESOPHAGOGASTRODUODENOSCOPY (EGD) WITH PROPOFOL;  Surgeon: Virgel Manifold, MD;  Location: [facility] ENDOSCOPY;  Service: Endoscopy;  Laterality: N/A;  . REDUCTION MAMMAPLASTY Bilateral 11/13/2018   right breast 42mm DCIS found during breast reduction  . SUPRACERVICAL ABDOMINAL HYSTERECTOMY  04/10/2011   But still has ovaries.    I have reviewed the social history and family history with the patient and they are unchanged from previous note.  ALLERGIES:  is allergic to amoxicillin-pot clavulanate, morphine, and penicillins.  MEDICATIONS:  Current Outpatient Medications  Medication Sig Dispense Refill  . amphetamine-dextroamphetamine (ADDERALL) 30 MG tablet Take 30 mg by mouth 2 (two) times daily.   0  . APPLE CIDER VINEGAR PO Take 1,600 mg by mouth daily.    . Ashwagandha 500 MG CAPS Take 3,000 mg by mouth at bedtime.    . clotrimazole-betamethasone (LOTRISONE) cream Apply 1 application topically 2 (two) times daily. (Patient not taking:  Reported on 05/24/2020) 30 g 0  . Cranberry 1000 MG CAPS Take 30,000 mg by mouth at bedtime.    . Cyanocobalamin (VITAMIN B-12) 5000 MCG TBDP Take by mouth.    . dicyclomine (BENTYL) 10 MG capsule TAKE 1 CAPSULE (10 MG TOTAL) BY MOUTH 4 (FOUR) TIMES DAILY - BEFORE MEALS AND AT BEDTIME. (Patient not taking: Reported on 05/24/2020) 120 capsule 1  . DULoxetine HCl 40 MG CPEP Take 2 capsules by mouth every morning.    . Ferrous Sulfate (IRON PO) Take 25 mg by mouth.    . FIBER PO Take by mouth daily.    Marland Kitchen gabapentin (NEURONTIN) 800 MG tablet Take 1 tablet (800 mg total) by mouth 3 (three) times daily as needed (nerve pain). 180 tablet 1  . Ginkgo Biloba 40 MG TABS Take by mouth.    . Glutamine 500 MG TABS Take 1,000 mg by mouth in the morning and at bedtime.    . Melatonin 3 MG TABS Take 1 tablet by mouth at bedtime.    . meloxicam (MOBIC) 15 MG tablet TAKE 1 TABLET BY MOUTH EVERY DAY 30 tablet 2  . metFORMIN (GLUCOPHAGE-XR) 500 MG 24 hr tablet TAKE 1 TABLET BY MOUTH EVERY DAY WITH BREAKFAST 90 tablet 1  . metroNIDAZOLE (METROGEL VAGINAL) 0.75 % vaginal gel Place 1 Applicatorful vaginally 2 (two) times daily. (Patient not taking: Reported on 05/24/2020) 70 g 0  . omeprazole (PRILOSEC) 20 MG capsule Take 1 capsule (20 mg total) by mouth  daily. 30 capsule 0  . ondansetron (ZOFRAN ODT) 4 MG disintegrating tablet Take 1 tablet (4 mg total) by mouth every 8 (eight) hours as needed for nausea or vomiting. (Patient not taking: Reported on 05/24/2020) 20 tablet 0  . oxyCODONE-acetaminophen (PERCOCET) 5-325 MG tablet Take 1 tablet by mouth every 6 (six) hours as needed for severe pain. (Patient not taking: Reported on 05/24/2020) 8 tablet 0  . predniSONE (DELTASONE) 50 MG tablet Take 1 tablet (50 mg total) by mouth daily. (Patient not taking: Reported on 05/24/2020) 5 tablet 0  . Probiotic Product (PROBIOTIC PO) Take by mouth.    . sertraline (ZOLOFT) 25 MG tablet Take 25 mg by mouth daily. (Patient not taking: Reported  on 05/24/2020)    . tiaGABine (GABITRIL) 4 MG tablet Takes $RemoveBefo'4mg'KTHqvbVkcpP$  in the AM and $Remo'12mg'WKDem$  at bedtime    . trimethoprim (TRIMPEX) 100 MG tablet Take 1 tablet (100 mg total) by mouth as needed (post-coital prophylaxis to prevent UTIs). (Patient not taking: Reported on 05/24/2020) 30 tablet 3  . varenicline (CHANTIX) 1 MG tablet Take 1 tablet (1 mg total) by mouth 2 (two) times daily. 60 tablet 3  . zolpidem (AMBIEN) 10 MG tablet Take 1 tablet (10 mg total) by mouth at bedtime as needed for sleep. 30 tablet 5   No current facility-administered medications for this visit.    PHYSICAL EXAMINATION: ECOG PERFORMANCE STATUS: {CHL ONC ECOG PS:[id number]}  There were no vitals filed for this visit. There were no vitals filed for this visit.  GENERAL:alert, no distress and comfortable SKIN: skin color, texture, turgor are normal, no rashes or significant lesions EYES: normal, Conjunctiva are pink and non-injected, sclera clear OROPHARYNX:no exudate, no erythema and lips, buccal mucosa, and tongue normal  NECK: supple, thyroid normal size, non-tender, without nodularity LYMPH:  no palpable lymphadenopathy in the cervical, axillary or inguinal LUNGS: clear to auscultation and percussion with normal breathing effort HEART: regular rate & rhythm and no murmurs and no lower extremity edema ABDOMEN:abdomen soft, non-tender and normal bowel sounds Musculoskeletal:no cyanosis of digits and no clubbing  NEURO: alert & oriented x 3 with fluent speech, no focal motor/sensory deficits  LABORATORY DATA:  I have reviewed the data as listed CBC Latest Ref Rng & Units 04/25/2020 10/21/2019 03/29/2018  WBC 4.0 - 10.5 K/uL 11.8(H) 7.3 7.7  Hemoglobin 12.0 - 15.0 g/dL 13.6 13.5 12.5  Hematocrit 36.0 - 46.0 % 40.7 41.0 40.1  Platelets 150 - 400 K/uL 351 250 267     CMP Latest Ref Rng & Units 04/25/2020 10/21/2019 06/05/2018  Glucose 70 - 99 mg/dL 105(H) 94 96  BUN 6 - 20 mg/dL $Remove'12 11 11  'IiSBzVf$ Creatinine 0.44 - 1.00 mg/dL 0.73 0.94  0.81  Sodium 135 - 145 mmol/L 139 140 136  Potassium 3.5 - 5.1 mmol/L 3.9 4.1 4.2  Chloride 98 - 111 mmol/L 100 104 103  CO2 22 - 32 mmol/L $RemoveB'29 29 23  'fyhCCnqB$ Calcium 8.9 - 10.3 mg/dL 8.6(L) 9.0 9.4  Total Protein 6.5 - 8.1 g/dL 6.7 6.1 7.3  Total Bilirubin 0.3 - 1.2 mg/dL 0.7 0.6 0.6  Alkaline Phos 38 - 126 U/L 32(L) - 29(L)  AST 15 - 41 U/L $Remo'17 14 17  'GJYVx$ ALT 0 - 44 U/L $Remo'25 11 13      'Egmgr$ RADIOGRAPHIC STUDIES: I have personally reviewed the radiological images as listed and agreed with the findings in the report. No results found.   ASSESSMENT & PLAN:  No problem-specific Assessment & Plan notes found  for this encounter.   No orders of the defined types were placed in this encounter.  All questions were answered. The patient knows to call the clinic with any problems, questions or concerns. No barriers to learning was detected. I spent {CHL ONC TIME VISIT - IDUPB:[id number]} counseling the patient face to face. The total time spent in the appointment was {CHL ONC TIME VISIT - SXQKS:[id number]} and more than 50% was on counseling and review of test results     Alla Feeling, NP 05/24/20

## 2020-05-25 ENCOUNTER — Inpatient Hospital Stay: Payer: BC Managed Care – PPO | Admitting: Nurse Practitioner

## 2020-05-25 ENCOUNTER — Ambulatory Visit
Admission: RE | Admit: 2020-05-25 | Discharge: 2020-05-25 | Disposition: A | Discharge status: Home / Self Care | Payer: BC Managed Care – PPO | Source: Ambulatory Visit | Attending: Internal Medicine | Admitting: Internal Medicine

## 2020-05-25 ENCOUNTER — Inpatient Hospital Stay: Payer: BC Managed Care – PPO | Attending: Nurse Practitioner

## 2020-05-25 ENCOUNTER — Other Ambulatory Visit: Payer: Self-pay

## 2020-05-25 ENCOUNTER — Telehealth: Payer: Self-pay | Admitting: Nurse Practitioner

## 2020-05-25 DIAGNOSIS — M797 Fibromyalgia: Secondary | ICD-10-CM | POA: Insufficient documentation

## 2020-05-25 DIAGNOSIS — R410 Disorientation, unspecified: Secondary | ICD-10-CM | POA: Insufficient documentation

## 2020-05-25 DIAGNOSIS — R251 Tremor, unspecified: Secondary | ICD-10-CM | POA: Diagnosis not present

## 2020-05-25 DIAGNOSIS — W19XXXA Unspecified fall, initial encounter: Secondary | ICD-10-CM | POA: Diagnosis not present

## 2020-05-25 DIAGNOSIS — R292 Abnormal reflex: Secondary | ICD-10-CM | POA: Diagnosis not present

## 2020-05-25 DIAGNOSIS — Z170 Estrogen receptor positive status [ER+]: Secondary | ICD-10-CM | POA: Insufficient documentation

## 2020-05-25 DIAGNOSIS — Z853 Personal history of malignant neoplasm of breast: Secondary | ICD-10-CM | POA: Insufficient documentation

## 2020-05-25 DIAGNOSIS — D0511 Intraductal carcinoma in situ of right breast: Secondary | ICD-10-CM | POA: Insufficient documentation

## 2020-05-25 DIAGNOSIS — Z90710 Acquired absence of both cervix and uterus: Secondary | ICD-10-CM | POA: Insufficient documentation

## 2020-05-25 DIAGNOSIS — R29818 Other symptoms and signs involving the nervous system: Secondary | ICD-10-CM | POA: Insufficient documentation

## 2020-05-25 DIAGNOSIS — W19XXXD Unspecified fall, subsequent encounter: Secondary | ICD-10-CM

## 2020-05-25 DIAGNOSIS — S0990XA Unspecified injury of head, initial encounter: Secondary | ICD-10-CM | POA: Diagnosis present

## 2020-05-25 DIAGNOSIS — Z87891 Personal history of nicotine dependence: Secondary | ICD-10-CM | POA: Insufficient documentation

## 2020-05-25 DIAGNOSIS — R29898 Other symptoms and signs involving the musculoskeletal system: Secondary | ICD-10-CM | POA: Diagnosis present

## 2020-05-25 MED ORDER — GADOBUTROL 1 MMOL/ML IV SOLN
8.0000 mL | Freq: Once | INTRAVENOUS | Status: AC | PRN
  Administered 2020-05-25: 8 mL via INTRAVENOUS

## 2020-05-25 NOTE — Telephone Encounter (Signed)
Called pt per 2/2 sch msg  - left message for pt to call back to reschedule apt.

## 2020-05-30 ENCOUNTER — Telehealth: Payer: Self-pay | Admitting: Nurse Practitioner

## 2020-05-30 NOTE — Telephone Encounter (Signed)
R/s apt per 2/7 sch msg - pt is aware of appt date and time .

## 2020-06-02 ENCOUNTER — Other Ambulatory Visit: Payer: Self-pay | Admitting: Gastroenterology

## 2020-06-05 NOTE — Progress Notes (Incomplete)
[place]   Telephone:(336) [phone] Fax:(336) [phone]   Clinic Follow up Note   Patient Care Team: [name], MD as PCP - General (Internal Medicine) Alphonsa Overall, MD as Consulting Physician (General Surgery) 06/05/2020  CHIEF COMPLAINT:   SUMMARY OF ONCOLOGIC HISTORY: Oncology History Overview Note  Cancer Staging No matching staging information was found for the patient.    DCIS (ductal carcinoma in situ)  11/06/2018 Mammogram   Mammogram 11/06/18  IMPRESSION: No mammographic evidence of malignancy. A result letter of this screening mammogram will be mailed directly to the patient.   11/13/2018 Surgery   B/l Breast Reduction   11/13/2018 Pathology Results    Diagnosis 11/13/18 1. Breast, Mammoplasty, right tissue, 76g - DUCTAL CARCINOMA IN SITU. 0.4cm 2. Breast, Mammoplasty, left tissue, 212g - BENIGN BREAST TISSUE.  Estrogen Receptor: 95%, POSITIVE, STRONG STAINING INTENSITY Progesterone Receptor: 95%, POSITIVE, STRONG STAINING INTENSITY   12/06/2018 Initial Diagnosis   Ductal carcinoma in situ (DCIS) of right breast   01/16/2019 Genetic Testing   Negative genetic testing on the common hereditary cancer panel.  The Common Hereditary Gene Panel offered by [facility] includes sequencing and/or deletion duplication testing of the following 48 genes: APC, ATM, AXIN2, BARD1, BMPR1A, BRCA1, BRCA2, BRIP1, CDH1, CDK4, CDKN2A (p14ARF), CDKN2A (p16INK4a), CHEK2, CTNNA1, DICER1, EPCAM (Deletion/duplication testing only), GREM1 (promoter region deletion/duplication testing only), KIT, MEN1, MLH1, MSH2, MSH3, MSH6, MUTYH, NBN, NF1, NHTL1, PALB2, PDGFRA, PMS2, POLD1, POLE, PTEN, RAD50, RAD51C, RAD51D, RNF43, SDHB, SDHC, SDHD, SMAD4, SMARCA4. STK11, TP53, TSC1, TSC2, and VHL.  The following genes were evaluated for sequence changes only: SDHA and HOXB13 c.251G>A variant only. The report date is January 16, 2019.   02/24/2019 Breast MRI   IMPRESSION: 1. Indeterminate  6 mm enhancing mass in the upper outer left breast. Recommendation is for diagnostic evaluation with mammography and ultrasound. If there is no mammographic or sonographic correlate, recommendation is to proceed with MRI guided biopsy. 2. No suspicious MRI findings on the right.   03/02/2019 Breast US   Bilateral mammo/L br US IMPRESSION: Expected bilateral post reduction surgical scarring as described. No focal abnormality over the upper outer left breast to correlate to the recent MRI finding.   03/12/2019 Breast MRI   MRI Guided biopsy attempt IMPRESSION: The small mass in the upper-outer quadrant of the left breast which was the target of the intended MRI guided biopsy today is obscured by marked increase in background parenchymal enhancement. Therefore the biopsy was canceled, and will need to be rescheduled.   03/24/2019 Pathology Results   Diagnosis Breast, left, needle core biopsy, upper outer - FIBROCYSTIC CHANGES. - PRIOR PROCEDURE SITE CHANGES.   11/10/2019 Breast MRI   IMPRESSION: No MR evidence of breast malignancy. RECOMMENDATION: Bilateral diagnostic mammogram in 6 months to resume annual mammogram schedule in this patient with history of RIGHT DCIS in 2020     CURRENT THERAPY:   INTERVAL HISTORY:   REVIEW OF SYSTEMS:   Constitutional: Denies fevers, chills or abnormal weight loss Eyes: Denies blurriness of vision Ears, nose, mouth, throat, and face: Denies mucositis or sore throat Respiratory: Denies cough, dyspnea or wheezes Cardiovascular: Denies palpitation, chest discomfort or lower extremity swelling Gastrointestinal:  Denies nausea, heartburn or change in bowel habits Skin: Denies abnormal skin rashes Lymphatics: Denies new lymphadenopathy or easy bruising Neurological:Denies numbness, tingling or new weaknesses Behavioral/Psych: Mood is stable, no new changes  All other systems were reviewed with the patient and are negative.  MEDICAL HISTORY:  Past Medical History:  Diagnosis Date  . Anal fissure   . Anxiety   . Breast cancer ([place]) 2002   lumpectomy-Right  . Depression   . Family history of brain cancer   . Family history of colon cancer   . Fibromyalgia   . Gallstones   . Irritable bowel syndrome   . Motion sickness    cars, boats  . Personal history of radiation therapy   . Yeast vaginitis     SURGICAL HISTORY: Past Surgical History:  Procedure Laterality Date  . ABDOMINAL HYSTERECTOMY    . 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 BREAST BIOPSY    . BREAST LUMPECTOMY Right 2003   Radiatiomn Therapy DCIS  . CHOLECYSTECTOMY  2000  . COLECTOMY  10/31/2012   SECONDARY TO COLONIC INERTIA  . ESOPHAGOGASTRODUODENOSCOPY (EGD) WITH PROPOFOL N/A 05/11/2020   Procedure: ESOPHAGOGASTRODUODENOSCOPY (EGD) WITH PROPOFOL;  Surgeon: Virgel Manifold, MD;  Location: [facility] ENDOSCOPY;  Service: Endoscopy;  Laterality: N/A;  . 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 SUPRACERVICAL ABDOMINAL HYSTERECTOMY  04/10/2011   But still has ovaries.    I have reviewed the social history and family history with the patient and they are unchanged from previous note.  ALLERGIES:  is allergic to amoxicillin-pot clavulanate, morphine, and penicillins.  MEDICATIONS:  Current Outpatient Medications  Medication Sig Dispense Refill  . amphetamine-dextroamphetamine (ADDERALL) 30 MG tablet Take 30 mg by mouth 2 (two) times daily.   0  . APPLE CIDER VINEGAR PO Take 1,600 mg by mouth daily.    . Ashwagandha 500 MG CAPS Take 3,000 mg by mouth at bedtime.    . clotrimazole-betamethasone (LOTRISONE) cream Apply 1 application topically 2 (two) times daily. (Patient not taking: Reported on 05/24/2020) 30 g 0  . Cranberry 1000 MG CAPS Take 30,000 mg by mouth at bedtime.    . Cyanocobalamin (VITAMIN B-12) 5000 MCG TBDP Take by mouth.    . dicyclomine (BENTYL) 10 MG capsule TAKE 1  CAPSULE (10 MG TOTAL) BY MOUTH 4 (FOUR) TIMES DAILY - BEFORE MEALS AND AT BEDTIME. (Patient not taking: Reported on 05/24/2020) 120 capsule 1  . DULoxetine HCl 40 MG CPEP Take 2 capsules by mouth every morning.    . Ferrous Sulfate (IRON PO) Take 25 mg by mouth.    . FIBER PO Take by mouth daily.    Marland Kitchen gabapentin (NEURONTIN) 800 MG tablet Take 1 tablet (800 mg total) by mouth 3 (three) times daily as needed (nerve pain). 180 tablet 1  . Ginkgo Biloba 40 MG TABS Take by mouth.    . Glutamine 500 MG TABS Take 1,000 mg by mouth in the morning and at bedtime.    . Melatonin 3 MG TABS Take 1 tablet by mouth at bedtime.    . meloxicam (MOBIC) 15 MG tablet TAKE 1 TABLET BY MOUTH EVERY DAY 30 tablet 2  . metFORMIN (GLUCOPHAGE-XR) 500 MG 24 hr tablet TAKE 1 TABLET BY MOUTH EVERY DAY WITH BREAKFAST 90 tablet 1  . metroNIDAZOLE (METROGEL VAGINAL) 0.75 % vaginal gel Place 1 Applicatorful vaginally 2 (two) times daily. (Patient not taking: Reported on 05/24/2020) 70 g 0  . omeprazole (PRILOSEC) 20 MG capsule Take 1 capsule (20 mg total) by mouth daily. 30 capsule 0  . ondansetron (ZOFRAN ODT) 4 MG disintegrating tablet Take 1 tablet (4 mg total) by mouth every 8 (eight) hours as needed for nausea or vomiting. (  Patient not taking: Reported on 05/24/2020) 20 tablet 0  . oxyCODONE-acetaminophen (PERCOCET) 5-325 MG tablet Take 1 tablet by mouth every 6 (six) hours as needed for severe pain. (Patient not taking: Reported on 05/24/2020) 8 tablet 0  . predniSONE (DELTASONE) 50 MG tablet Take 1 tablet (50 mg total) by mouth daily. (Patient not taking: Reported on 05/24/2020) 5 tablet 0  . Probiotic Product (PROBIOTIC PO) Take by mouth.    . sertraline (ZOLOFT) 25 MG tablet Take 25 mg by mouth daily. (Patient not taking: Reported on 05/24/2020)    . tiaGABine (GABITRIL) 4 MG tablet Takes $RemoveBefo'4mg'wGGpEJrJOEb$  in the AM and $Remo'12mg'yCtvp$  at bedtime    . trimethoprim (TRIMPEX) 100 MG tablet Take 1 tablet (100 mg total) by mouth as needed (post-coital  prophylaxis to prevent UTIs). (Patient not taking: Reported on 05/24/2020) 30 tablet 3  . varenicline (CHANTIX) 1 MG tablet Take 1 tablet (1 mg total) by mouth 2 (two) times daily. 60 tablet 3  . zolpidem (AMBIEN) 10 MG tablet Take 1 tablet (10 mg total) by mouth at bedtime as needed for sleep. 30 tablet 5   No current facility-administered medications for this visit.    PHYSICAL EXAMINATION: ECOG PERFORMANCE STATUS: {CHL ONC ECOG PS:[id number]}  There were no vitals filed for this visit. There were no vitals filed for this visit.  GENERAL:alert, no distress and comfortable SKIN: skin color, texture, turgor are normal, no rashes or significant lesions EYES: normal, Conjunctiva are pink and non-injected, sclera clear OROPHARYNX:no exudate, no erythema and lips, buccal mucosa, and tongue normal  NECK: supple, thyroid normal size, non-tender, without nodularity LYMPH:  no palpable lymphadenopathy in the cervical, axillary or inguinal LUNGS: clear to auscultation and percussion with normal breathing effort HEART: regular rate & rhythm and no murmurs and no lower extremity edema ABDOMEN:abdomen soft, non-tender and normal bowel sounds Musculoskeletal:no cyanosis of digits and no clubbing  NEURO: alert & oriented x 3 with fluent speech, no focal motor/sensory deficits  LABORATORY DATA:  I have reviewed the data as listed CBC Latest Ref Rng & Units 04/25/2020 10/21/2019 03/29/2018  WBC 4.0 - 10.5 K/uL 11.8(H) 7.3 7.7  Hemoglobin 12.0 - 15.0 g/dL 13.6 13.5 12.5  Hematocrit 36.0 - 46.0 % 40.7 41.0 40.1  Platelets 150 - 400 K/uL 351 250 267     CMP Latest Ref Rng & Units 04/25/2020 10/21/2019 06/05/2018  Glucose 70 - 99 mg/dL 105(H) 94 96  BUN 6 - 20 mg/dL $Remove'12 11 11  'IWojEAT$ Creatinine 0.44 - 1.00 mg/dL 0.73 0.94 0.81  Sodium 135 - 145 mmol/L 139 140 136  Potassium 3.5 - 5.1 mmol/L 3.9 4.1 4.2  Chloride 98 - 111 mmol/L 100 104 103  CO2 22 - 32 mmol/L $RemoveB'29 29 23  'yrdJAePT$ Calcium 8.9 - 10.3 mg/dL 8.6(L) 9.0 9.4   Total Protein 6.5 - 8.1 g/dL 6.7 6.1 7.3  Total Bilirubin 0.3 - 1.2 mg/dL 0.7 0.6 0.6  Alkaline Phos 38 - 126 U/L 32(L) - 29(L)  AST 15 - 41 U/L $Remo'17 14 17  'yXeSw$ ALT 0 - 44 U/L $Remo'25 11 13      'iusLk$ RADIOGRAPHIC STUDIES: I have personally reviewed the radiological images as listed and agreed with the findings in the report. No results found.   ASSESSMENT & PLAN:  No problem-specific Assessment & Plan notes found for this encounter.   No orders of the defined types were placed in this encounter.  All questions were answered. The patient knows to call the clinic with any problems,  questions or concerns. No barriers to learning was detected. I spent {CHL ONC TIME VISIT - IAXKP:[id number]} counseling the patient face to face. The total time spent in the appointment was {CHL ONC TIME VISIT - MLJQG:[id number]} and more than 50% was on counseling and review of test results     Alla Feeling, NP 06/05/20

## 2020-06-07 ENCOUNTER — Inpatient Hospital Stay: Payer: BC Managed Care – PPO | Admitting: Hematology

## 2020-06-07 ENCOUNTER — Inpatient Hospital Stay: Payer: BC Managed Care – PPO

## 2020-06-07 ENCOUNTER — Inpatient Hospital Stay: Payer: BC Managed Care – PPO | Admitting: Nurse Practitioner

## 2020-06-07 ENCOUNTER — Other Ambulatory Visit: Payer: Self-pay

## 2020-06-07 ENCOUNTER — Encounter: Payer: Self-pay | Admitting: Hematology

## 2020-06-07 ENCOUNTER — Telehealth: Payer: Self-pay | Admitting: Hematology

## 2020-06-07 VITALS — BP 109/82 | HR 84 | Temp 98.5°F | Resp 16 | Ht 67.0 in | Wt 185.1 lb

## 2020-06-07 DIAGNOSIS — D0511 Intraductal carcinoma in situ of right breast: Secondary | ICD-10-CM

## 2020-06-07 DIAGNOSIS — Z87891 Personal history of nicotine dependence: Secondary | ICD-10-CM | POA: Diagnosis not present

## 2020-06-07 DIAGNOSIS — M797 Fibromyalgia: Secondary | ICD-10-CM | POA: Diagnosis not present

## 2020-06-07 DIAGNOSIS — Z90710 Acquired absence of both cervix and uterus: Secondary | ICD-10-CM | POA: Diagnosis not present

## 2020-06-07 DIAGNOSIS — R29818 Other symptoms and signs involving the nervous system: Secondary | ICD-10-CM | POA: Diagnosis not present

## 2020-06-07 DIAGNOSIS — Z170 Estrogen receptor positive status [ER+]: Secondary | ICD-10-CM | POA: Diagnosis not present

## 2020-06-07 LAB — CMP (CANCER CENTER ONLY)
ALT: 15 U/L (ref 0–44)
AST: 19 U/L (ref 15–41)
Albumin: 4.1 g/dL (ref 3.5–5.0)
Alkaline Phosphatase: 38 U/L (ref 38–126)
Anion gap: 9 (ref 5–15)
BUN: 19 mg/dL (ref 6–20)
CO2: 24 mmol/L (ref 22–32)
Calcium: 9.4 mg/dL (ref 8.9–10.3)
Chloride: 104 mmol/L (ref 98–111)
Creatinine: 0.96 mg/dL (ref 0.44–1.00)
GFR, Estimated: >60 mL/min (ref >60)
Glucose, Bld: 93 mg/dL (ref 70–99)
Potassium: 4.5 mmol/L (ref 3.5–5.1)
Sodium: 137 mmol/L (ref 135–145)
Total Bilirubin: 0.3 mg/dL (ref 0.3–1.2)
Total Protein: 7.8 g/dL (ref 6.5–8.1)

## 2020-06-07 LAB — CBC WITH DIFFERENTIAL (CANCER CENTER ONLY)
Abs Immature Granulocytes: 0.02 10*3/uL (ref 0.00–0.07)
Basophils Absolute: 0.1 10*3/uL (ref 0.0–0.1)
Basophils Relative: 1 %
Eosinophils Absolute: 0.1 10*3/uL (ref 0.0–0.5)
Eosinophils Relative: 2 %
HCT: 40.9 % (ref 36.0–46.0)
Hemoglobin: 13.6 g/dL (ref 12.0–15.0)
Immature Granulocytes: 0 %
Lymphocytes Relative: 22 %
Lymphs Abs: 1.9 10*3/uL (ref 0.7–4.0)
MCH: 31.1 pg (ref 26.0–34.0)
MCHC: 33.3 g/dL (ref 30.0–36.0)
MCV: 93.6 fL (ref 80.0–100.0)
Monocytes Absolute: 0.5 10*3/uL (ref 0.1–1.0)
Monocytes Relative: 6 %
Neutro Abs: 5.8 10*3/uL (ref 1.7–7.7)
Neutrophils Relative %: 69 %
Platelet Count: 330 10*3/uL (ref 150–400)
RBC: 4.37 MIL/uL (ref 3.87–5.11)
RDW: 12.9 % (ref 11.5–15.5)
WBC Count: 8.4 10*3/uL (ref 4.0–10.5)
nRBC: 0 % (ref 0.0–0.2)

## 2020-06-07 NOTE — Progress Notes (Signed)
[place]   [phone] Fax:(336) [phone]   Clinic Follow up Note   Patient Care Team: [name], MD as PCP - General (Internal Medicine) Alphonsa Overall, MD as Consulting Physician (General Surgery) 06/07/2020  CHIEF COMPLAINT: f/u DCIS   SUMMARY OF ONCOLOGIC HISTORY: Oncology History Overview Note  Cancer Staging DCIS (ductal carcinoma in situ) Staging form: Breast, AJCC 8th Edition - Pathologic stage from 11/13/2018: Stage Unknown (pTis (DCIS), pNX, ER+, PR+, HER2: Not Assessed) - Signed by Truitt Merle, MD on 06/07/2020 Nuclear grade: G1 Residual tumor (R): R0 - None    DCIS (ductal carcinoma in situ)  11/06/2018 Mammogram   Mammogram 11/06/18  IMPRESSION: No mammographic evidence of malignancy. A result letter of this screening mammogram will be mailed directly to the patient.   11/13/2018 Surgery   B/l Breast Reduction   11/13/2018 Pathology Results    Diagnosis 11/13/18 1. Breast, Mammoplasty, right tissue, 76g - DUCTAL CARCINOMA IN SITU. 0.4cm 2. Breast, Mammoplasty, left tissue, 212g - BENIGN BREAST TISSUE.  Estrogen Receptor: 95%, POSITIVE, STRONG STAINING INTENSITY Progesterone Receptor: 95%, POSITIVE, STRONG STAINING INTENSITY   11/13/2018 Cancer Staging   Staging form: Breast, AJCC 8th Edition - Pathologic stage from 11/13/2018: Stage Unknown (pTis (DCIS), pNX, ER+, PR+, HER2: Not Assessed) - Signed by Truitt Merle, MD on 06/07/2020 Nuclear grade: G1 Residual tumor (R): R0 - None   12/06/2018 Initial Diagnosis   Ductal carcinoma in situ (DCIS) of right breast   01/16/2019 Genetic Testing   Negative genetic testing on the common hereditary cancer panel.  The Common Hereditary Gene Panel offered by [facility] includes sequencing and/or deletion duplication testing of the following 48 genes: APC, ATM, AXIN2, BARD1, BMPR1A, BRCA1, BRCA2, BRIP1, CDH1, CDK4, CDKN2A (p14ARF), CDKN2A (p16INK4a), CHEK2, CTNNA1, DICER1, EPCAM (Deletion/duplication  testing only), GREM1 (promoter region deletion/duplication testing only), KIT, MEN1, MLH1, MSH2, MSH3, MSH6, MUTYH, NBN, NF1, NHTL1, PALB2, PDGFRA, PMS2, POLD1, POLE, PTEN, RAD50, RAD51C, RAD51D, RNF43, SDHB, SDHC, SDHD, SMAD4, SMARCA4. STK11, TP53, TSC1, TSC2, and VHL.  The following genes were evaluated for sequence changes only: SDHA and HOXB13 c.251G>A variant only. The report date is January 16, 2019.   02/24/2019 Breast MRI   IMPRESSION: 1. Indeterminate 6 mm enhancing mass in the upper outer left breast. Recommendation is for diagnostic evaluation with mammography and ultrasound. If there is no mammographic or sonographic correlate, recommendation is to proceed with MRI guided biopsy. 2. No suspicious MRI findings on the right.   03/02/2019 Breast US   Bilateral mammo/L br US IMPRESSION: Expected bilateral post reduction surgical scarring as described. No focal abnormality over the upper outer left breast to correlate to the recent MRI finding.   03/12/2019 Breast MRI   MRI Guided biopsy attempt IMPRESSION: The small mass in the upper-outer quadrant of the left breast which was the target of the intended MRI guided biopsy today is obscured by marked increase in background parenchymal enhancement. Therefore the biopsy was canceled, and will need to be rescheduled.   03/24/2019 Pathology Results   Diagnosis Breast, left, needle core biopsy, upper outer - FIBROCYSTIC CHANGES. - PRIOR PROCEDURE SITE CHANGES.   11/10/2019 Breast MRI   IMPRESSION: No MR evidence of breast malignancy. RECOMMENDATION: Bilateral diagnostic mammogram in 6 months to resume annual mammogram schedule in this patient with history of RIGHT DCIS in 2020     CURRENT THERAPY: Surveillance  INTERVAL HISTORY: Joellyn is here for a follow up.  She presents to clinic alone.  She has been  doing well overall, no concerns of her breasts.  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 She had several episodes of sleepwalking in the past months, and had mild injury, including a bruise in the left breast during the episodes.  She states that is probably secondary to medicine Chantix which did help her quit smoking.  She overall feels well, fibromyalgia is stable and controlled.  All other systems were reviewed with the patient and are negative.  MEDICAL HISTORY:  Past Medical History:  Diagnosis Date  . Anal fissure   . Anxiety   . Breast cancer ([place]) 2002   lumpectomy-Right  . Depression   . Family history of brain cancer   . Family history of colon cancer   . Fibromyalgia   . Gallstones   . Irritable bowel syndrome   . Motion sickness    cars, boats  . Personal history of radiation therapy   . Yeast vaginitis     SURGICAL HISTORY: Past Surgical History:  Procedure Laterality Date  . ABDOMINAL HYSTERECTOMY    . 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 BREAST BIOPSY    . BREAST LUMPECTOMY Right 2003   Radiatiomn Therapy DCIS  . CHOLECYSTECTOMY  2000  . COLECTOMY  10/31/2012   SECONDARY TO COLONIC INERTIA  . ESOPHAGOGASTRODUODENOSCOPY (EGD) WITH PROPOFOL N/A 05/11/2020   Procedure: ESOPHAGOGASTRODUODENOSCOPY (EGD) WITH PROPOFOL;  Surgeon: Virgel Manifold, MD;  Location: [facility] ENDOSCOPY;  Service: Endoscopy;  Laterality: N/A;  . REDUCTION MAMMAPLASTY Bilateral 11/13/2018   right breast 40mm DCIS found during breast reduction  . SUPRACERVICAL ABDOMINAL HYSTERECTOMY  04/10/2011   But still has ovaries.    I have reviewed the social history and family history with the patient and they are unchanged from previous note.  ALLERGIES:  is allergic to amoxicillin-pot clavulanate, morphine, and penicillins.  MEDICATIONS:  Current Outpatient Medications  Medication Sig Dispense Refill  . amphetamine-dextroamphetamine (ADDERALL) 30 MG tablet Take 30 mg by mouth 2 (two) times  daily.   0  . APPLE CIDER VINEGAR PO Take 1,600 mg by mouth daily.    . Ashwagandha 500 MG CAPS Take 3,000 mg by mouth at bedtime.    . clotrimazole-betamethasone (LOTRISONE) cream Apply 1 application topically 2 (two) times daily. (Patient not taking: Reported on 05/24/2020) 30 g 0  . Cranberry 1000 MG CAPS Take 30,000 mg by mouth at bedtime.    . Cyanocobalamin (VITAMIN B-12) 5000 MCG TBDP Take by mouth.    . dicyclomine (BENTYL) 10 MG capsule TAKE 1 CAPSULE (10 MG TOTAL) BY MOUTH 4 (FOUR) TIMES DAILY - BEFORE MEALS AND AT BEDTIME. (Patient not taking: Reported on 05/24/2020) 120 capsule 1  . DULoxetine HCl 40 MG CPEP Take 2 capsules by mouth every morning.    . Ferrous Sulfate (IRON PO) Take 25 mg by mouth.    . FIBER PO Take by mouth daily.    Marland Kitchen gabapentin (NEURONTIN) 800 MG tablet Take 1 tablet (800 mg total) by mouth 3 (three) times daily as needed (nerve pain). 180 tablet 1  . Ginkgo Biloba 40 MG TABS Take by mouth.    . Glutamine 500 MG TABS Take 1,000 mg by mouth in the morning and at bedtime.    . Melatonin 3 MG TABS Take 1 tablet by mouth at bedtime.    . meloxicam (MOBIC) 15 MG tablet TAKE 1 TABLET BY MOUTH  EVERY DAY 30 tablet 2  . metFORMIN (GLUCOPHAGE-XR) 500 MG 24 hr tablet TAKE 1 TABLET BY MOUTH EVERY DAY WITH BREAKFAST 90 tablet 1  . metroNIDAZOLE (METROGEL VAGINAL) 0.75 % vaginal gel Place 1 Applicatorful vaginally 2 (two) times daily. (Patient not taking: Reported on 05/24/2020) 70 g 0  . omeprazole (PRILOSEC) 20 MG capsule Take 1 capsule (20 mg total) by mouth daily. 30 capsule 0  . ondansetron (ZOFRAN ODT) 4 MG disintegrating tablet Take 1 tablet (4 mg total) by mouth every 8 (eight) hours as needed for nausea or vomiting. (Patient not taking: Reported on 05/24/2020) 20 tablet 0  . oxyCODONE-acetaminophen (PERCOCET) 5-325 MG tablet Take 1 tablet by mouth every 6 (six) hours as needed for severe pain. (Patient not taking: Reported on 05/24/2020) 8 tablet 0  . predniSONE (DELTASONE) 50  MG tablet Take 1 tablet (50 mg total) by mouth daily. (Patient not taking: Reported on 05/24/2020) 5 tablet 0  . Probiotic Product (PROBIOTIC PO) Take by mouth.    . tiaGABine (GABITRIL) 4 MG tablet Takes 66m in the AM and 147mat bedtime     No current facility-administered medications for this visit.    PHYSICAL EXAMINATION: ECOG PERFORMANCE STATUS: 0 - Asymptomatic  Vitals:   06/07/20 1219  BP: 109/82  Pulse: 84  Resp: 16  Temp: 98.5 F (36.9 C)  SpO2: 99%   Filed Weights   06/07/20 1219  Weight: 185 lb 1.6 oz (84 kg)    GENERAL:alert, no distress and comfortable SKIN: skin color, texture, turgor are normal, no rashes or significant lesions EYES: normal, Conjunctiva are pink and non-injected, sclera clear OROPHARYNX:no exudate, no erythema and lips, buccal mucosa, and tongue normal  NECK: supple, thyroid normal size, non-tender, without nodularity LYMPH:  no palpable lymphadenopathy in the cervical, axillary or inguinal LUNGS: clear to auscultation and percussion with normal breathing effort HEART: regular rate & rhythm and no murmurs and no lower extremity edema ABDOMEN:abdomen soft, non-tender and normal bowel sounds Musculoskeletal:no cyanosis of digits and no clubbing  NEURO: alert & oriented x 3 with fluent speech, no focal motor/sensory deficits Breasts: Breast inspection showed them to be symmetrical with no nipple discharge.  Bilateral surgical incisionss well-healed.  Palpation of the breasts and axilla revealed no obvious mass that I could appreciate. (+) Ecchymosis on lateral of left breast from recent fall.   LABORATORY DATA:  I have reviewed the data as listed CBC Latest Ref Rng & Units 06/07/2020 04/25/2020 10/21/2019  WBC 4.0 - 10.5 K/uL 8.4 11.8(H) 7.3  Hemoglobin 12.0 - 15.0 g/dL 13.6 13.6 13.5  Hematocrit 36.0 - 46.0 % 40.9 40.7 41.0  Platelets 150 - 400 K/uL 330 351 250     CMP Latest Ref Rng & Units 04/25/2020 10/21/2019 06/05/2018  Glucose 70 - 99 mg/dL  105(H) 94 96  BUN 6 - 20 mg/dL _0 Creatinine 0.44 - 1.00 mg/dL 0.73 0.94 0.81  Sodium 135 - 145 mmol/L 139 140 136  Potassium 3.5 - 5.1 mmol/L 3.9 4.1 4.2  Chloride 98 - 111 mmol/L 100 104 103  CO2 22 - 32 mmol/L _1 Calcium 8.9 - 10.3 mg/dL 8.6(L) 9.0 9.4  Total Protein 6.5 - 8.1 g/dL 6.7 6.1 7.3  Total Bilirubin 0.3 - 1.2 mg/dL 0.7 0.6 0.6  Alkaline Phos 38 - 126 U/L 32(L) - 29(L)  AST 15 - 41 U/L _2 ALT 0 - 44 U/L 25 11 13  RADIOGRAPHIC STUDIES: I have personally reviewed the radiological images as listed and agreed with the findings in the report. No results found.   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 -Underwent b/l breast reduction on 11/13/18. Her 11/06/18 Mammogram showed NED, but her final surgical pathologyshowed a smallright breast DCIS,both ER and PR were strongly positive.This was completely surgically removed. -Patient is not interested in chemoprevention with antiestrogen therapy, with main concern of low libido. -Continue cancer breast cancer screening with annual mammogram and MRI.  She is overdue for mammogram, I ordered last mammogram for her to be done at [facility] late this month and screening breast MRI w wo contrast in [place] in 10/2020  -We also discussed healthy lifestyle, including healthy diet, daily exercise, weight loss, etc to reduce her risk of cancer  -she will continue self breast exam  -f/u with [facility] in a year, no need routine lab     No problem-specific Assessment & Plan notes found for this encounter.   Orders Placed This Encounter  Procedures  . MM DIAG BREAST TOMO  BILATERAL    Standing Status:   Future    Standing Expiration Date:   06/07/2021    Order Specific Question:   Reason for Exam (SYMPTOM  OR DIAGNOSIS REQUIRED)    Answer:   screening, pt is overdue    Order Specific Question:   Is the patient pregnant?    Answer:   No    Order Specific Question:   Preferred imaging location?    Answer:   [facility]  . MR BREAST BILATERAL W WO CONTRAST INC CAD    Standing Status:   Future    Standing Expiration Date:   06/07/2021    Order Specific Question:   If indicated for the ordered procedure, I authorize the administration of contrast media per Radiology protocol    Answer:   Yes    Order Specific Question:   What is the patient's sedation requirement?    Answer:   No Sedation    Order Specific Question:   Does the patient have a pacemaker or implanted devices?    Answer:   No    Order Specific Question:   Radiology Contrast Protocol - do NOT remove file path    Answer:   [email or web address]    Order Specific Question:   Preferred imaging location?    Answer:   [facility] (table limit-350lbs)   All questions were answered. The patient knows to call the clinic with any problems, questions or concerns. No barriers to learning was detected. I spent 20 minutes counseling the patient face to face. The total time spent in the appointment was 25 minutes and more than 50% was on counseling and review of test results     Truitt Merle, MD 06/07/20

## 2020-06-07 NOTE — Telephone Encounter (Signed)
Rescheduled appointment to an earlier time and w/Dr. Burr Medico per provider's request. Patient is aware of changes.

## 2020-06-07 NOTE — Telephone Encounter (Signed)
Last office visit 05/11/2020 0 refills  Last office visit Start omeprazole for 4 weeks due to her epigastric pain

## 2020-06-07 NOTE — Telephone Encounter (Signed)
Scheduled follow-up appointment per 2/15 los. Patient is aware. 

## 2020-06-10 ENCOUNTER — Other Ambulatory Visit: Payer: Self-pay | Admitting: Internal Medicine

## 2020-06-13 ENCOUNTER — Telehealth: Payer: Self-pay | Admitting: Nurse Practitioner

## 2020-06-13 NOTE — Telephone Encounter (Signed)
Moved upcoming appointment per provider's template. Patient is aware of changes and stated she might have to call back later in the year to reschedule due to her work schedule.

## 2020-06-15 ENCOUNTER — Other Ambulatory Visit: Payer: Self-pay | Admitting: Family Medicine

## 2020-06-15 NOTE — Telephone Encounter (Signed)
Please advise 

## 2020-06-16 NOTE — Telephone Encounter (Signed)
Your CMA needs to response to this rx.

## 2020-06-30 ENCOUNTER — Other Ambulatory Visit: Payer: Self-pay | Admitting: Gastroenterology

## 2020-07-12 ENCOUNTER — Other Ambulatory Visit: Payer: Self-pay | Admitting: Internal Medicine

## 2020-07-18 ENCOUNTER — Other Ambulatory Visit: Payer: Self-pay | Admitting: Family Medicine

## 2020-07-18 NOTE — Telephone Encounter (Signed)
Please advise 

## 2020-08-02 ENCOUNTER — Other Ambulatory Visit: Payer: Self-pay | Admitting: Family Medicine

## 2020-08-02 NOTE — Telephone Encounter (Signed)
Pt rx refill request approved per Dr. Clovis Riley orders.

## 2020-08-09 NOTE — Progress Notes (Signed)
I, [name], LAT, ATC, am serving as scribe for Dr. [name].  Gwendolyn [name] is a 50 y.o. female who presents to [place] at [facility] today for f/u of progressively worsening R arm pain.  She was last seen by Dr. Georgina Snell on 04/27/20 for f/u of worsening neck and R arm pain despite taking both Gabapentin and prednisone.  A R UE NCV/EMG test was ordered but the pt never proceeded w/ that test.  She was also referred to neurology and saw Dr. Manuella Ghazi at [facility] on 05/25/20.  She was also seen at the [facility] on 06/17/20 for her R shoulder and was diagnosed w/ an incomplete rotator cuff tear and had a R subacromial injection.  Since her last visit w/ Dr. Georgina Snell, pt reports R shoulder is excruciating all the time. Pt c/o pain is radiating up into R side of neck and into clavicular region. Pt notes the R shoulder looks lower than the L. Pt reports an "achy" feeling along her clavicle. Pt expressed frustration in not being able to work out and w/ weight gain.  Diagnostic imaging: C-spine and R shoulder XR- 06/17/20; C-spine MRI- 04/24/20; C-spine XR- 03/31/20  Pertinent review of systems: No fevers or chills  Relevant historical information: Functional neurologic disorder diagnosis.   Exam:  BP 116/78 (BP Location: Left Arm, Patient Position: Sitting, Cuff Size: Normal)   Pulse 82   Resp (!) 98   Ht 5\' 7"  (1.702 m)   Wt 181 lb 9.6 oz (82.4 kg)   BMI 28.44 kg/m  General: Well Developed, well nourished, and in no acute distress.   MSK: Right shoulder prominent right trapezius compared to left otherwise normal-appearing Normal motion some pain with abduction. Internal rotation also produces pain. Strength intact. Mildly positive Hawkins and Neer's test.  Mildly positive empty can test. Negative Yergason's and speeds test. Some pain with shoulder shrug. Tender palpation at trapezius and rhomboid.  C-spine normal-appearing nontender to midline.  Tender palpation  right trapezius.  Normal cervical motion.    Lab and Radiology Results  Xray Right shoulder: 06/17/20  From Orlene Och, anteroposterior, and scapular Y views of the right shoulder  obtained. Images reveal significant loss of acromioclavicular joint space  with mild osteophyte formation. Glenohumeral joint space and subacromial  spaces are well-preserved. Nosignificant osteophyte formation noted.   Humerus well reduced in the glenoid. Exam End: 06/17/20 4:23 PM     EXAM: MRI CERVICAL SPINE WITHOUT CONTRAST  TECHNIQUE: Multiplanar, multisequence MR imaging of the cervical spine was performed. No intravenous contrast was administered.  COMPARISON:  Cervical spine radiographs 03/31/2020  FINDINGS: Alignment: Normal  Vertebrae: Normal bone marrow.  Negative for fracture or mass.  Cord: Normal signal and morphology.  Posterior Fossa, vertebral arteries, paraspinal tissues: Negative  Disc levels:  C2-3: Small central disc protrusion.  Negative for stenosis  C3-4: Small central disc protrusion.  Negative for stenosis  C4-5: Small central disc protrusion.  Negative for stenosis  C5-6: Small central disc protrusion.  Negative for stenosis  C6-7: Small central disc protrusion and mild uncinate spurring. No significant spinal or foraminal stenosis  C7-T1: Negative  IMPRESSION: Mild multilevel degenerative change in the cervical spine. Negative for neural impingement   Electronically Signed   By: Franchot Gallo M.D.   On: 04/24/2020 16:42  I, [name], personally (independently) visualized and performed the interpretation of the MRI images attached in this note.  Diagnostic Limited MSK Ultrasound of: Right shoulder Biceps  tendon intact normal-appearing Subscapularis tendon intact slight calcification at distal tendon insertion. Supraspinatus tendon is intact with again slight calcification at the distal tendon insertion. Moderate  subacromial bursitis present. Infraspinatus tendon is intact and normal appearing AC joint degenerative with effusion Impression: Subacromial bursitis and calcific tendinopathy and subscapularis and supraspinatus.  AC DJD    Assessment and Plan: 50 y.o. female with multifactorial right shoulder pain.  Patient does have some rotator cuff pathology and subacromial bursitis and AC DJD that are causing some of her shoulder pain.  Additionally she has trapezius and rhomboid and other periscapular muscle dysfunction.  Plan for physician directed home exercise program taught in clinic today by ATC.  She also had some of this back in February with her orthopedic PA encounter.  She had a subacromial injection in February which helped but did not last very long.  Recheck in 1 month.  If not better at that time would proceed to MRI of the shoulder for potential surgical planning.  Refill of gabapentin for fibromyalgia.   PDMP not reviewed this encounter. Orders Placed This Encounter  Procedures  . [place] LIMITED JOINT SPACE STRUCTURES UP RIGHT(NO LINKED CHARGES)    Standing Status:   Future    Number of Occurrences:   1    Standing Expiration Date:   02/09/2021    Order Specific Question:   Reason for Exam (SYMPTOM  OR DIAGNOSIS REQUIRED)    Answer:   chronic right shoulder pain    Order Specific Question:   Preferred imaging location?    Answer:   [place]   Meds ordered this encounter  Medications  . gabapentin (NEURONTIN) 800 MG tablet    Sig: Take 1 tablet (800 mg total) by mouth 3 (three) times daily as needed (nerve pain).    Dispense:  180 tablet    Refill:  1     Discussed warning signs or symptoms. Please see discharge instructions. Patient expresses understanding.   The above documentation has been reviewed and is accurate and complete [name], M.D.

## 2020-08-10 ENCOUNTER — Ambulatory Visit: Payer: Self-pay

## 2020-08-10 ENCOUNTER — Other Ambulatory Visit: Payer: Self-pay

## 2020-08-10 ENCOUNTER — Ambulatory Visit: Payer: BC Managed Care – PPO | Admitting: Family Medicine

## 2020-08-10 VITALS — BP 116/78 | HR 82 | Resp 98 | Ht 67.0 in | Wt 181.6 lb

## 2020-08-10 DIAGNOSIS — M25511 Pain in right shoulder: Secondary | ICD-10-CM | POA: Diagnosis not present

## 2020-08-10 DIAGNOSIS — M797 Fibromyalgia: Secondary | ICD-10-CM | POA: Diagnosis not present

## 2020-08-10 MED ORDER — GABAPENTIN 800 MG PO TABS: 800.0000 mg | ORAL_TABLET | Freq: Three times a day (TID) | ORAL | 1 refills | Status: DC | PRN

## 2020-08-10 NOTE — Patient Instructions (Addendum)
Thank you for coming in today.  Please complete the exercises that the athletic trainer went over with you: View at [email or web address] using code: [id number]  Recheck in 1 month.   If not better we can do MRI and we can plan for surgery.   Consider PT.

## 2020-09-08 ENCOUNTER — Ambulatory Visit (INDEPENDENT_AMBULATORY_CARE_PROVIDER_SITE_OTHER): Payer: BC Managed Care – PPO

## 2020-09-08 ENCOUNTER — Encounter: Payer: Self-pay | Admitting: Family Medicine

## 2020-09-08 ENCOUNTER — Other Ambulatory Visit: Payer: Self-pay

## 2020-09-08 ENCOUNTER — Ambulatory Visit (INDEPENDENT_AMBULATORY_CARE_PROVIDER_SITE_OTHER): Payer: BC Managed Care – PPO | Admitting: Family Medicine

## 2020-09-08 VITALS — BP 102/70 | HR 81 | Ht 67.0 in | Wt 179.8 lb

## 2020-09-08 DIAGNOSIS — M25511 Pain in right shoulder: Secondary | ICD-10-CM | POA: Diagnosis not present

## 2020-09-08 NOTE — Patient Instructions (Signed)
Thank you for coming in today.  Please get an Xray today before you leave  You should hear from MRI scheduling within 1 week. If you do not hear please let me know.   Assuming there is a surgical issue on MRI I will refer to surgery.

## 2020-09-08 NOTE — Progress Notes (Signed)
I, [name], LAT, ATC, am serving as scribe for Dr. [name].  Gwendolyn [name] is a 50 y.o. female who presents to [place] at [facility] today for f/u of R shoulder pain.  She was last seen by Dr. Georgina Snell on 08/10/20 w/ c/o constant, excruciating shoulder pain radiating into her R neck and clavicular region and was shown a HEP.  Prior to that, she was last seen by Dr. Georgina Snell on 04/27/20 for f/u of worsening neck and R arm pain despite taking both Gabapentin and prednisone.  A R UE NCV/EMG test was ordered but the pt never proceeded w/ that test.  She was also referred to neurology and saw Dr. Manuella Ghazi at [facility] on 05/25/20.  She was also seen at the [facility] on 06/17/20 for her R shoulder and was diagnosed w/ an incomplete rotator cuff tear and had a R subacromial injection.  Since her last visit, pt reports that her R shoulder is slightly better but would still like to proceed to surgery ASAP.  She has been doing her HEP.  Diagnostic imaging: C-spine and R shoulder XR- 06/17/20; C-spine MRI- 04/24/20; C-spine XR- 03/31/20  Pertinent review of systems: No fevers or chills  Relevant historical information: Migraine   Exam:  BP 102/70 (BP Location: Right Arm, Patient Position: Sitting, Cuff Size: Normal)   Pulse 81   Ht 5\' 7"  (1.702 m)   Wt 179 lb 12.8 oz (81.6 kg)   SpO2 97%   BMI 28.16 kg/m  General: Well Developed, well nourished, and in no acute distress.   MSK: Right shoulder normal-appearing normal motion pain with abduction. Intact strength however pain with abduction.  Positive Hawkins and Neer's test positive empty can test. Negative Yergason's and speeds test. Negative clunk and relocation test.    Lab and Radiology Results  X-ray images right shoulder obtained today personally and independently interpreted [facility] degenerative changes.  Calcifications lateral humeral head.  No acute fractures.  No severe glenohumeral DJD. Await formal radiology  review    Assessment and Plan: 50 y.o. female with right shoulder pain thought to be rotator cuff tendinopathy and subacromial bursitis with possible rotator cuff tear.  Patient is failing conservative management.  Plan for MRI to further characterize causes of pain and for potential surgical planning.  Recheck after MRI.   PDMP not reviewed this encounter. Orders Placed This Encounter  Procedures  . DG Shoulder Right    Standing Status:   Future    Number of Occurrences:   1    Standing Expiration Date:   09/08/2021    Order Specific Question:   Reason for Exam (SYMPTOM  OR DIAGNOSIS REQUIRED)    Answer:   eval shoulder r    Order Specific Question:   Is patient pregnant?    Answer:   No    Order Specific Question:   Preferred imaging location?    Answer:   [facility]  . MR SHOULDER RIGHT WO CONTRAST    Standing Status:   Future    Standing Expiration Date:   09/08/2021    Order Specific Question:   What is the patient's sedation requirement?    Answer:   No Sedation    Order Specific Question:   Does the patient have a pacemaker or implanted devices?    Answer:   No    Order Specific Question:   Preferred imaging location?    Answer:   [facility] (table limit-350lbs)  No orders of the defined types were placed in this encounter.    Discussed warning signs or symptoms. Please see discharge instructions. Patient expresses understanding.   The above documentation has been reviewed and is accurate and complete [name], M.D.

## 2020-09-12 NOTE — Progress Notes (Signed)
Right shoulder x-ray shows some arthritis of the small joint at the top of the shoulder the [place] joint.  MRI will be helpful.

## 2020-09-18 ENCOUNTER — Other Ambulatory Visit: Payer: BC Managed Care – PPO

## 2020-09-18 ENCOUNTER — Other Ambulatory Visit: Payer: Self-pay

## 2020-09-18 ENCOUNTER — Ambulatory Visit (INDEPENDENT_AMBULATORY_CARE_PROVIDER_SITE_OTHER): Payer: BC Managed Care – PPO

## 2020-09-18 DIAGNOSIS — M79601 Pain in right arm: Secondary | ICD-10-CM | POA: Diagnosis not present

## 2020-09-18 DIAGNOSIS — M25511 Pain in right shoulder: Secondary | ICD-10-CM

## 2020-09-19 DIAGNOSIS — R300 Dysuria: Secondary | ICD-10-CM

## 2020-09-20 ENCOUNTER — Encounter: Payer: Self-pay | Admitting: Family Medicine

## 2020-09-20 ENCOUNTER — Other Ambulatory Visit: Payer: Self-pay

## 2020-09-20 ENCOUNTER — Other Ambulatory Visit (INDEPENDENT_AMBULATORY_CARE_PROVIDER_SITE_OTHER): Payer: BC Managed Care – PPO

## 2020-09-20 ENCOUNTER — Other Ambulatory Visit: Payer: Self-pay | Admitting: Internal Medicine

## 2020-09-20 DIAGNOSIS — R300 Dysuria: Secondary | ICD-10-CM

## 2020-09-20 MED ORDER — METFORMIN HCL ER 500 MG PO TB24: ORAL_TABLET | ORAL | 1 refills | Status: DC

## 2020-09-20 MED ORDER — CIPROFLOXACIN HCL 250 MG PO TABS: 250.0000 mg | ORAL_TABLET | Freq: Two times a day (BID) | ORAL | 0 refills | Status: DC

## 2020-09-20 NOTE — Progress Notes (Signed)
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  The bone on either side the joint is upset and irritated.  Additionally you have some tendinitis of the supraspinatus tendon of the rotator cuff that goes underneath the joint.  In my opinion this should be treated surgically by removing that irritated joint and widening in the joint.  This is a relatively mild surgery.  Would like me to refer you directly to orthopedic surgery?  Alternatively you could schedule a follow-up appoint with me to go over the results in further detail.

## 2020-09-21 LAB — URINE CULTURE
MICRO NUMBER:: 11950204
SPECIMEN QUALITY:: ADEQUATE

## 2020-09-21 LAB — URINALYSIS, ROUTINE W REFLEX MICROSCOPIC
Bilirubin Urine: NEGATIVE
Ketones, ur: NEGATIVE
Leukocytes,Ua: NEGATIVE
Nitrite: POSITIVE — AB
Specific Gravity, Urine: >=1.03 — AB (ref 1.000–1.030)
Urine Glucose: 100 — AB
Urobilinogen, UA: 1 (ref 0.0–1.0)
pH: 5.5 (ref 5.0–8.0)

## 2020-09-22 ENCOUNTER — Encounter: Payer: Self-pay | Admitting: Internal Medicine

## 2020-09-22 ENCOUNTER — Telehealth (INDEPENDENT_AMBULATORY_CARE_PROVIDER_SITE_OTHER): Payer: BC Managed Care – PPO | Admitting: Internal Medicine

## 2020-09-22 DIAGNOSIS — N390 Urinary tract infection, site not specified: Secondary | ICD-10-CM

## 2020-09-22 NOTE — Progress Notes (Signed)
Virtual Visit converted to Telephone Note  This visit type was conducted due to national recommendations for restrictions regarding the COVID-19 pandemic (e.g. social distancing).  This format is felt to be most appropriate for this patient at this time.  All issues noted in this document were discussed and addressed.  No physical exam was performed (except for noted visual exam findings with Video Visits).   I connected with@ on 09/22/20 at 12:30 PM EDT by  telephone and verified that I am speaking with the correct person using two identifiers. Location patient: home Location provider: work or home office Persons participating in the virtual visit: patient, provider  I discussed the limitations, risks, security and privacy concerns of performing an evaluation and management service by telephone and the availability of in person appointments. I also discussed with the patient that there may be a patient responsible charge related to this service. The patient expressed understanding and agreed to proceed.  Interactive audio and video telecommunications were attempted between this provider and patient, however failed, due to patient having technical difficulties  We continued and completed visit with audio only.   Reason for visit: dysuria  HPI:  50 yr old sexually active female presents for follow up on dysuria treated empirically with cipro pending UA and culture results.  UA was negative for pyuria,  Culture was negative for infection .  She had taken one dose of septra  DS prior to collecting the urine for analysis and culture. ( She has a history of recurrent UTI with certain sexual positions and has been prescribed by her urologist so she took one before she had contacted our office). Her symptoms have improved but not completely resolved    ROS: See pertinent positives and negatives per HPI.  Past Medical History:  Diagnosis Date  . Anal fissure   . Anxiety   . Breast cancer ([place])  2002   lumpectomy-Right  . Depression   . Family history of brain cancer   . Family history of colon cancer   . Fibromyalgia   . Gallstones   . Irritable bowel syndrome   . Motion sickness    cars, boats  . Personal history of radiation therapy   . Yeast vaginitis     Past Surgical History:  Procedure Laterality Date  . ABDOMINAL HYSTERECTOMY    . 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 BREAST BIOPSY    . BREAST LUMPECTOMY Right 2003   Radiatiomn Therapy DCIS  . CHOLECYSTECTOMY  2000  . COLECTOMY  10/31/2012   SECONDARY TO COLONIC INERTIA  . ESOPHAGOGASTRODUODENOSCOPY (EGD) WITH PROPOFOL N/A 05/11/2020   Procedure: ESOPHAGOGASTRODUODENOSCOPY (EGD) WITH PROPOFOL;  Surgeon: Virgel Manifold, MD;  Location: [facility] ENDOSCOPY;  Service: Endoscopy;  Laterality: N/A;  . 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 SUPRACERVICAL ABDOMINAL HYSTERECTOMY  04/10/2011   But still has ovaries.    Family History  Problem Relation Age of Onset  . Meniere's disease Father   . Alcohol abuse Sister   . Heart disease Maternal Grandmother   . Heart disease Maternal Grandfather   . Brain cancer Maternal Grandfather   . Heart disease Paternal Grandmother   . Heart disease Paternal Grandfather   . Rectal cancer Other        MGF's mother  . Colon cancer Neg Hx   . Esophageal cancer Neg Hx   . Breast cancer Neg Hx  SOCIAL HX:  reports that she has been smoking cigarettes. She has a 10.00 pack-year smoking history. She has never used smokeless tobacco. She reports current alcohol use. She reports that she does not use drugs.   Current Outpatient Medications:  .  amphetamine-dextroamphetamine (ADDERALL) 30 MG tablet, Take 30 mg by mouth 2 (two) times daily. , Disp: , Rfl: 0 .  APPLE CIDER VINEGAR PO, Take 1,600 mg by mouth daily., Disp: , Rfl:  .  Ashwagandha 500 MG CAPS, Take 3,000 mg by mouth at bedtime., Disp: , Rfl:   .  Cranberry 1000 MG CAPS, Take 30,000 mg by mouth at bedtime., Disp: , Rfl:  .  Cyanocobalamin (VITAMIN B-12) 5000 MCG TBDP, Take by mouth., Disp: , Rfl:  .  dicyclomine (BENTYL) 10 MG capsule, TAKE 1 CAPSULE (10 MG TOTAL) BY MOUTH 4 (FOUR) TIMES DAILY - BEFORE MEALS AND AT BEDTIME., Disp: 360 capsule, Rfl: 1 .  DULoxetine HCl 40 MG CPEP, Take 2 capsules by mouth every morning., Disp: , Rfl:  .  Ferrous Sulfate (IRON PO), Take 25 mg by mouth., Disp: , Rfl:  .  FIBER PO, Take by mouth daily., Disp: , Rfl:  .  gabapentin (NEURONTIN) 800 MG tablet, Take 1 tablet (800 mg total) by mouth 3 (three) times daily as needed (nerve pain)., Disp: 180 tablet, Rfl: 1 .  Ginkgo Biloba 40 MG TABS, Take by mouth., Disp: , Rfl:  .  Melatonin 3 MG TABS, Take 1 tablet by mouth at bedtime., Disp: , Rfl:  .  meloxicam (MOBIC) 15 MG tablet, TAKE 1 TABLET BY MOUTH EVERY DAY, Disp: 30 tablet, Rfl: 2 .  metFORMIN (GLUCOPHAGE-XR) 500 MG 24 hr tablet, TAKE 1 TABLET BY MOUTH EVERY DAY WITH BREAKFAST, Disp: 90 tablet, Rfl: 1 .  omeprazole (PRILOSEC) 20 MG capsule, TAKE 1 CAPSULE BY MOUTH EVERY DAY, Disp: 30 capsule, Rfl: 0 .  ondansetron (ZOFRAN ODT) 4 MG disintegrating tablet, Take 1 tablet (4 mg total) by mouth every 8 (eight) hours as needed for nausea or vomiting., Disp: 20 tablet, Rfl: 0 .  Probiotic Product (PROBIOTIC PO), Take by mouth., Disp: , Rfl:  .  QUEtiapine (SEROQUEL) 50 MG tablet, Take 50 mg by mouth at bedtime., Disp: , Rfl:  .  tiaGABine (GABITRIL) 4 MG tablet, Takes 4mg  in the AM and 12mg  at bedtime, Disp: , Rfl:  .  clotrimazole-betamethasone (LOTRISONE) cream, Apply 1 application topically 2 (two) times daily. (Patient not taking: Reported on 09/22/2020), Disp: 30 g, Rfl: 0 .  metroNIDAZOLE (METROGEL VAGINAL) 0.75 % vaginal gel, Place 1 Applicatorful vaginally 2 (two) times daily. (Patient not taking: Reported on 09/22/2020), Disp: 70 g, Rfl: 0  EXAM:  Patient denies headache, fevers, malaise,  unintentional weight loss, skin rash, eye pain, sinus congestion and sinus pain, sore throat, dysphagia,  hemoptysis , cough, dyspnea, wheezing, chest pain, palpitations, orthopnea, edema, abdominal pain, nausea, melena, diarrhea, constipation, flank pain, dysuria, hematuria, urinary  Frequency, nocturia, numbness, tingling, seizures,  Focal weakness, Loss of consciousness,  Tremor, insomnia, depression, anxiety, and suicidal ideation.     ASSESSMENT AND PLAN:  Discussed the following assessment and plan:  Recurrent UTI  Recurrent UTI She has a history of recurrent UTI brought on by certain sexual positions and has a standing prescription for Septra DS from her urologist.  Her current culture is negative.  She has been advised to finish cipro course and repeat Urine culture if symptoms persist.  Advised that in the future urine must be  collected PRIOR to first dose of antibiotic,  Even the one prescribed by her urologist.  If symptoms persist in spite of a repeat normal culture,  She will need a pelvic exam and STD testing  and follow up with urology if she has pyuria     I discussed the assessment and treatment plan with the patient. The patient was provided an opportunity to ask questions and all were answered. The patient agreed with the plan and demonstrated an understanding of the instructions.   The patient was advised to call back or seek an in-person evaluation if the symptoms worsen or if the condition fails to improve as anticipated.   I spent 22 minutes dedicated to the care of this patient on the date of this encounter to include pre-visit review of his medical history,  Telephone  time with the patient , and post visit ordering of testing and therapeutics.    [name], MD

## 2020-09-23 NOTE — Progress Notes (Signed)
I, [name], LAT, ATC, am serving as scribe for Dr. [name].  Gwendolyn [name] is a 50 y.o. female who presents to [place] at [facility] today for f/u of R shoulder pain, R shoulder MRI and for possible ACJ injection.  She was last seen by Dr. Georgina Snell on 09/08/20 and noted slight improvement in her symptoms but wanted to proceed to surgery ASAP if warranted.  Prior to that, she was last seen by Dr. Georgina Snell on 04/27/20 for f/u of worsening neck and R arm pain despite taking both Gabapentin and prednisone. A R UE NCV/EMG test was ordered but the pt never proceeded w/ that test. She was also referred to neurology and saw Dr. Manuella Ghazi at [facility] on 05/25/20. She was also seen at the [facility] on 06/17/20 for her R shoulder and was diagnosed w/ an incomplete rotator cuff tear and had a R subacromial injection.  She was referred for a R shoulder MRI and is here today to review her MRI.  Since her last visit, pt reports R shoulder is excruciating and is hurting 24/7. 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  Patient is traveling to [place] this week.  Diagnostic testing: R shoulder MRI- 09/18/20; R shoulder XR- 09/08/20; C-spine and R shoulder XR- 06/17/20; C-spine MRI- 04/24/20; C-spine XR- 03/31/20  Pertinent review of systems: No fevers or chills  Relevant historical information: Migraine headaches   Exam:  BP 101/64 (BP Location: Right Arm, Patient Position: Sitting, Cuff Size: Normal)   Pulse 83   Ht 5\' 7"  (1.702 m)   Wt 177 lb 6.4 oz (80.5 kg)   SpO2 98%   BMI 27.78 kg/m  General: Well Developed, well nourished, and in no acute distress.   MSK: Right shoulder Tender palpation AC joint.    Lab and Radiology Results  EXAM: MRI OF THE RIGHT SHOULDER WITHOUT CONTRAST  TECHNIQUE: Multiplanar, multisequence MR imaging of the shoulder was performed. No intravenous contrast was administered.  COMPARISON:   None.  FINDINGS: Rotator cuff: Mild tendinosis of the supraspinatus tendon. Infraspinatus tendon is intact. Teres minor tendon is intact. Subscapularis tendon is intact.  Muscles: No muscle atrophy or edema. No intramuscular fluid collection or hematoma.  Biceps Long Head: Intraarticular and extraarticular portions of the biceps tendon are intact.  Acromioclavicular Joint: Severe arthropathy of the acromioclavicular joint with reactive marrow edema on either side of the joint. Type II acromion. No subacromial/subdeltoid bursal fluid.  Glenohumeral Joint: No joint effusion. No chondral defect.  Labrum: Grossly intact, but evaluation is limited by lack of intraarticular fluid/contrast.  Bones: No fracture or dislocation. No aggressive osseous lesion.  Other: No fluid collection or hematoma.  IMPRESSION: 1. Mild tendinosis of the supraspinatus tendon. 2. Severe arthropathy of the acromioclavicular joint with reactive marrow edema on either side of the joint.   Electronically Signed   By: Kathreen Devoid   On: 09/19/2020 09:13 I, [name], personally (independently) visualized and performed the interpretation of the images attached in this note.  Procedure: Real-time Ultrasound Guided Injection of right AC joint superior approach Device: Philips Affiniti 50G Images permanently stored and available for review in PACS Verbal informed consent obtained.  Discussed risks and benefits of procedure. Warned about infection bleeding damage to structures skin hypopigmentation and fat atrophy among others. Patient expresses understanding and agreement Time-out conducted.   Noted no overlying erythema, induration, or other signs of local  infection.   Skin prepped in a sterile fashion.   Local anesthesia: Topical Ethyl chloride.   With sterile technique and under real time ultrasound guidance:  20 mg of Kenalog (0.29ml of 40mg /36ml solution) and 0.5 ml Marcaine injected into AC  joint. Fluid seen entering the joint capsule.   Completed without difficulty   Pain immediately resolved suggesting accurate placement of the medication.   Advised to call if fevers/chills, erythema, induration, drainage, or persistent bleeding.   Images permanently stored and available for review in the ultrasound unit.  Impression: Technically successful ultrasound guided injection.        Assessment and Plan: 50 y.o. female with right shoulder pain due to [facility] joint DJD and supraspinatus rotator cuff tendinopathy.  Patient has severe AC DJD which is primarily the source of pain.  I do not anticipate that this problem will resolve fully with further conservative management.  Plan for injection today to help her shoulder calm down enough so that she can attend her vacation in June without much pain.  However I will also refer to orthopedic surgery for surgical consultation as I do anticipate that will be the definitive treatment.  Anticipate subacromial decompression and distal clavicle excision.  Reviewed MRI and plan with patient expresses understanding and agreement.  She wishes to proceed with surgery   PDMP not reviewed this encounter. Orders Placed This Encounter  Procedures  . [place] LIMITED JOINT SPACE STRUCTURES UP RIGHT(NO LINKED CHARGES)    Standing Status:   Future    Number of Occurrences:   1    Standing Expiration Date:   03/28/2021    Order Specific Question:   Reason for Exam (SYMPTOM  OR DIAGNOSIS REQUIRED)    Answer:   right shoulder pain    Order Specific Question:   Preferred imaging location?    Answer:   [facility]  . Ambulatory referral to Orthopedic Surgery    Referral Priority:   Routine    Referral Type:   Surgical    Referral Reason:   Specialty Services Required    Requested Specialty:   Orthopedic Surgery    Number of Visits Requested:   1   No orders of the defined types were placed in this encounter.    Discussed warning  signs or symptoms. Please see discharge instructions. Patient expresses understanding.   The above documentation has been reviewed and is accurate and complete [name], M.D.

## 2020-09-24 NOTE — Assessment & Plan Note (Addendum)
She has a history of recurrent UTI brought on by certain sexual positions and has a standing prescription for Septra DS from her urologist.  Her current culture is negative.  She has been advised to finish cipro course and repeat Urine culture if symptoms persist.  Advised that in the future urine must be collected PRIOR to first dose of antibiotic,  Even the one prescribed by her urologist.  If symptoms persist in spite of a repeat normal culture,  She will need a pelvic exam and STD testing  and follow up with urology if she has pyuria

## 2020-09-26 ENCOUNTER — Other Ambulatory Visit: Payer: Self-pay

## 2020-09-26 ENCOUNTER — Ambulatory Visit: Payer: Self-pay

## 2020-09-26 ENCOUNTER — Ambulatory Visit (INDEPENDENT_AMBULATORY_CARE_PROVIDER_SITE_OTHER): Payer: BC Managed Care – PPO | Admitting: Family Medicine

## 2020-09-26 VITALS — BP 101/64 | HR 83 | Ht 67.0 in | Wt 177.4 lb

## 2020-09-26 DIAGNOSIS — M25511 Pain in right shoulder: Secondary | ICD-10-CM

## 2020-09-26 DIAGNOSIS — M19011 Primary osteoarthritis, right shoulder: Secondary | ICD-10-CM | POA: Diagnosis not present

## 2020-09-26 NOTE — Patient Instructions (Signed)
Thank you for coming in today.  Call or go to the ER if you develop a large red swollen joint with extreme pain or oozing puss.   Schedule with orthopedics in the near future.   Schedule for when you return after the 16th.

## 2020-09-28 ENCOUNTER — Telehealth: Payer: Self-pay

## 2020-09-28 NOTE — Telephone Encounter (Signed)
Attempted to call pt. Mail box full.  °

## 2020-09-28 NOTE — Telephone Encounter (Signed)
Has she provided a urine specimen as discussed at her recent visit?  I will not prescribe antibiotics again because she pretreated last time and I advised her to obtain a urine specimen FIRST

## 2020-09-28 NOTE — Telephone Encounter (Signed)
Pt was made aware that Dr. Derrel Nip would not call in an antibiotic without a urine sample first. Pt stated that she was leaving to go out of the country at 4pm this evening.

## 2020-09-28 NOTE — Telephone Encounter (Signed)
Pt called and states that she is having symptoms of a UTI that came back this morning. I transferred her to [place] [facility] and let her know that we do not have any appts avail today. Pt is flying out to [place] and is worried that she won't have a way to treat it while there.

## 2020-09-28 NOTE — Telephone Encounter (Signed)
PT called to find out what is going on as she is expecting a callback in regards to the ongoing UTI symptoms she is having. She is wanting to find out something asap as she is flying out tonight to [place]. She would like a callback asap as to what to do.

## 2020-09-28 NOTE — Telephone Encounter (Signed)
[facility] Documentation   

## 2020-09-28 NOTE — Telephone Encounter (Signed)
Attempted to call pt. Mail box is full.  

## 2020-09-28 NOTE — Telephone Encounter (Signed)
Gwendolyn [name] connected with Gwendolyn [name] from [employer] before being transferred to me. Gwendolyn [name] states that she was triaged and declined going to the ER or Urgent Care. Spoke to Gwendolyn [name] and states she is reexperiencing the Burning and urinary frequency UTI Symptoms. Gwendolyn [name] states that she was given 5 days of Cipro by Dr. Derrel Nip but only took 2 before stopping the medication for 24 hours to take a different medication from her Urologist. She was instructed by our office to continue the Cipro and took the remaining 3 days worth. The Burning and frequency stopped but then came back. Gwendolyn [name] is calling today for another dose of Cipro medication. She is going out of country this evening and asks to be informed of the decision before the office closes.

## 2020-09-28 NOTE — Telephone Encounter (Signed)
Pt returned call and was informed that Dr. Derrel Nip would not call in an antibiotic without pt submitting a urine first. Pt stated that she was leaving to go out of the country this evening at 4pm.

## 2020-10-07 ENCOUNTER — Ambulatory Visit (INDEPENDENT_AMBULATORY_CARE_PROVIDER_SITE_OTHER): Payer: BC Managed Care – PPO | Admitting: Orthopedic Surgery

## 2020-10-07 ENCOUNTER — Encounter: Payer: Self-pay | Admitting: Orthopedic Surgery

## 2020-10-07 DIAGNOSIS — M19011 Primary osteoarthritis, right shoulder: Secondary | ICD-10-CM

## 2020-10-07 NOTE — Progress Notes (Signed)
Office Visit Note   Patient: Gwendolyn [name]           Date of Birth: June 21, 1970           MRN: [id number] Visit Date: 10/07/2020 Requested by: Gregor Hams, MD [place],  [place] [id number] PCP: [name], MD  Subjective: Chief Complaint  Patient presents with   Left Knee - Pain   Right Knee - Pain    HPI: Patient presents for evaluation of right shoulder pain.  She has had shoulder pain for about 2 years.  Denies any history of injury but she does do a lot of dance as well as a lot of fitness activity.  No weight from sleep at night but she does localize the pain to the superior aspect of the right shoulder.  She had a cortisone injection into the [facility] joint 10 days ago which gave her significant relief.  Symptoms are worse in the morning in terms of range of motion.  Radiates down into the deltoid region.  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  She is going to be traveling to [place] to visit her daughter who is in school in [place].  Hard for her to hold a coffee cup at times because of that right shoulder pain.  MRI scan is reviewed and it shows intact rotator cuff intact labrum but intense edema and swelling around the right AC joint.  She starts back to school on August 18.  She does have a history of fibromyalgia.              ROS: All systems reviewed are negative as they relate to the chief complaint within the history of present illness.  Patient denies  fevers or chills.   Assessment & Plan: Visit Diagnoses:  1. Arthritis of right acromioclavicular joint     Plan: Impression is right shoulder AC joint arthritis which is symptomatic but has responded well to an [facility] joint injection.  Starting to wear off to a slight degree.  Based on the amount of edema present I think she would likely remain symptomatic with that [facility] joint until arthroscopic excision.  We discussed operative and nonoperative treatment  options but in general I think her best option would be arthroscopic distal clavicle excision.  The risk and benefits of the procedure are discussed with the patient including not limited to infection shoulder stiffness as well as incomplete pain relief.  Normal rehab course discussed.  All questions answered.  Plan to do this sometime is close to July 7 is possible to maximize her time for recovery prior to the beginning of school.  Follow-Up Instructions: No follow-ups on file.   Orders:  No orders of the defined types were placed in this encounter.  No orders of the defined types were placed in this encounter.     Procedures: No procedures performed   Clinical Data: No additional findings.  Objective: Vital Signs: There were no vitals taken for this visit.  Physical Exam:   Constitutional: Patient appears well-developed HEENT:  Head: Normocephalic Eyes:EOM are normal Neck: Normal range of motion Cardiovascular: Normal rate Pulmonary/chest: Effort normal Neurologic: Patient is alert Skin: Skin is warm Psychiatric: Patient has normal mood and affect   Ortho Exam: Ortho exam demonstrates full active and passive range of motion of the cervical spine.  She does have tenderness to palpation on the right greater than left AC joint.  No restriction of passive external rotation 15 degrees of abduction.  Rotator cuff strength is intact infraspinatus supraspinatus subscap muscle testing.  Does have some clicking and grinding in the [facility] joint with passive range of motion as well as pain with crossarm adduction on the right.  No other masses lymphadenopathy or skin changes noted in that shoulder region  Specialty Comments:  No specialty comments available.  Imaging: No results found.   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   Hematuria 12/05/2017   Rectal bleeding 09/02/2017   Encounter for preventive health examination 11/22/2016   History of abdominal supracervical subtotal hysterectomy 11/22/2016   Plantar fasciitis 11/10/2015   Vaginitis 03/18/2015   Menopause 01/18/2015   Hemorrhoids 10/06/2014   Hypercholesteremia 10/05/2014   Vitamin D deficiency 10/05/2014   Depressive disorder 10/05/2014   History of breast cancer 10/05/2014   Migraine without aura with status migrainosus 10/05/2014   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   Insomnia 10/05/2014   Bipolar 2 disorder ([place]) 10/05/2014   IBS (irritable bowel syndrome) 10/05/2014   Obstructive sleep apnea 10/05/2014   S/P colectomy 10/05/2014   Hypersomnia 10/05/2014   Anxiety 10/05/2014   Fibromyalgia 10/05/2014   Periodic limb movement disorder 10/05/2014   Past Medical History:  Diagnosis Date   Anal fissure    Anxiety    Breast cancer ([place]) 2002   lumpectomy-Right   Depression    Family history of brain cancer    Family history of colon cancer    Fibromyalgia    Gallstones    Irritable bowel syndrome    Motion sickness    cars, boats   Personal history of radiation therapy    Yeast vaginitis     Family History  Problem Relation Age of Onset   Meniere's disease Father    Alcohol abuse Sister    Heart disease Maternal Grandmother    Heart disease Maternal Grandfather    Brain cancer Maternal Grandfather    Heart disease Paternal Grandmother    Heart disease Paternal Grandfather    Rectal cancer Other        MGF's mother   Colon cancer Neg Hx    Esophageal cancer Neg Hx    Breast cancer Neg Hx     Past  Surgical History:  Procedure Laterality Date   ABDOMINAL HYSTERECTOMY     BREAST BIOPSY Right 2002   Breast cancer DCIS   BREAST BIOPSY     BREAST LUMPECTOMY Right 2003   Radiatiomn Therapy DCIS   CHOLECYSTECTOMY  2000   COLECTOMY  10/31/2012   SECONDARY TO COLONIC INERTIA   ESOPHAGOGASTRODUODENOSCOPY (EGD) WITH PROPOFOL N/A 05/11/2020   Procedure: ESOPHAGOGASTRODUODENOSCOPY (EGD) WITH PROPOFOL;  Surgeon: Virgel Manifold, MD;  Location: [facility] ENDOSCOPY;  Service: Endoscopy;  Laterality: N/A;   REDUCTION MAMMAPLASTY Bilateral 11/13/2018   right breast 76mm DCIS found during breast reduction   SUPRACERVICAL ABDOMINAL HYSTERECTOMY  04/10/2011   But still has ovaries.   Social History   Occupational History  Occupation: Pharmacist, hospital  Tobacco Use   Smoking status: Light Smoker    Packs/day: 0.50    Years: 20.00    Pack years: 10.00    Types: Cigarettes   Smokeless tobacco: Never   Tobacco comments:    smoked 10 yrs, quit 20 yrs, smoked last 4 yrs  Vaping Use   Vaping Use: Never used  Substance and Sexual Activity   Alcohol use: Yes    Alcohol/week: 0.0 standard drinks    Comment: OCCASIONALLY   Drug use: No   Sexual activity: Yes    Birth control/protection: Surgical

## 2020-10-12 ENCOUNTER — Other Ambulatory Visit: Payer: Self-pay

## 2020-10-17 ENCOUNTER — Ambulatory Visit (INDEPENDENT_AMBULATORY_CARE_PROVIDER_SITE_OTHER): Payer: BC Managed Care – PPO | Admitting: Family

## 2020-10-17 ENCOUNTER — Telehealth: Payer: Self-pay | Admitting: Internal Medicine

## 2020-10-17 ENCOUNTER — Other Ambulatory Visit: Payer: Self-pay

## 2020-10-17 DIAGNOSIS — Z111 Encounter for screening for respiratory tuberculosis: Secondary | ICD-10-CM | POA: Diagnosis not present

## 2020-10-17 NOTE — Telephone Encounter (Signed)
Is it okay to place order and schedule pt for PPD placement?

## 2020-10-17 NOTE — Telephone Encounter (Signed)
Patient needs a TB test done for work. Do we have to put orders in?

## 2020-10-17 NOTE — Telephone Encounter (Signed)
Dr. Derrel Nip out of the office until tomorrow and pt needs done to today to have read before she leaves to go out of town. Spoke with Joycelyn Schmid, NP and she gave a verbal okay to order. Pt

## 2020-10-17 NOTE — Telephone Encounter (Signed)
Spoke with pt and scheduled her for a TB skin test this afternoon.

## 2020-10-18 ENCOUNTER — Ambulatory Visit: Payer: BC Managed Care – PPO

## 2020-10-19 LAB — HEMOGLOBIN A1C: Hemoglobin A1C: 5.4

## 2020-10-20 ENCOUNTER — Other Ambulatory Visit: Payer: Self-pay

## 2020-10-20 ENCOUNTER — Encounter: Payer: Self-pay | Admitting: Internal Medicine

## 2020-10-20 ENCOUNTER — Ambulatory Visit (INDEPENDENT_AMBULATORY_CARE_PROVIDER_SITE_OTHER): Payer: BC Managed Care – PPO

## 2020-10-20 DIAGNOSIS — Z111 Encounter for screening for respiratory tuberculosis: Secondary | ICD-10-CM | POA: Diagnosis not present

## 2020-10-20 LAB — LIPID PANEL
Cholesterol: 242 — AB (ref 0–200)
HDL: 53 (ref 35–70)
LDL Cholesterol: 158
Triglycerides: 172 — AB (ref 40–160)

## 2020-10-20 LAB — TB SKIN TEST
Induration: 0 mm
TB Skin Test: NEGATIVE

## 2020-10-20 LAB — HEMOGLOBIN A1C: Hemoglobin A1C: 5.4

## 2020-10-20 NOTE — Progress Notes (Signed)
Patient presented for PPD read to left forearm. Results are Negative/0MM.

## 2020-10-21 ENCOUNTER — Telehealth: Payer: Self-pay

## 2020-10-21 NOTE — Telephone Encounter (Signed)
error 

## 2020-11-02 ENCOUNTER — Encounter (HOSPITAL_COMMUNITY)
Admission: RE | Admit: 2020-11-02 | Discharge: 2020-11-02 | Disposition: A | Discharge status: Home / Self Care | Payer: BC Managed Care – PPO | Source: Ambulatory Visit | Attending: Orthopedic Surgery | Admitting: Orthopedic Surgery

## 2020-11-02 ENCOUNTER — Other Ambulatory Visit: Payer: Self-pay

## 2020-11-02 ENCOUNTER — Encounter (HOSPITAL_COMMUNITY): Payer: Self-pay

## 2020-11-02 DIAGNOSIS — Z01812 Encounter for preprocedural laboratory examination: Secondary | ICD-10-CM | POA: Insufficient documentation

## 2020-11-02 HISTORY — DX: Gastro-esophageal reflux disease without esophagitis: K21.9

## 2020-11-02 HISTORY — DX: Unspecified osteoarthritis, unspecified site: M19.90

## 2020-11-02 LAB — CBC
HCT: 45.3 % (ref 36.0–46.0)
Hemoglobin: 14.5 g/dL (ref 12.0–15.0)
MCH: 31.1 pg (ref 26.0–34.0)
MCHC: 32 g/dL (ref 30.0–36.0)
MCV: 97.2 fL (ref 80.0–100.0)
Platelets: 315 10*3/uL (ref 150–400)
RBC: 4.66 MIL/uL (ref 3.87–5.11)
RDW: 14.1 % (ref 11.5–15.5)
WBC: 9.8 10*3/uL (ref 4.0–10.5)
nRBC: 0 % (ref 0.0–0.2)

## 2020-11-02 LAB — BASIC METABOLIC PANEL
Anion gap: 7 (ref 5–15)
BUN: 11 mg/dL (ref 6–20)
CO2: 26 mmol/L (ref 22–32)
Calcium: 9 mg/dL (ref 8.9–10.3)
Chloride: 102 mmol/L (ref 98–111)
Creatinine, Ser: 0.87 mg/dL (ref 0.44–1.00)
GFR, Estimated: >60 mL/min (ref >60)
Glucose, Bld: 100 mg/dL — ABNORMAL HIGH (ref 70–99)
Potassium: 4 mmol/L (ref 3.5–5.1)
Sodium: 135 mmol/L (ref 135–145)

## 2020-11-02 NOTE — Progress Notes (Signed)
PCP: Deborra Medina, MD Cardiologist: Denies  EKG: na CXR: 04/27/20 ECHO: denies Stress Test: denies Cardiac Cath: denies  Fasting Blood Sugar- na Checks Blood Sugar__na_ times a day (Patient is on Metformin for weight loss)  OSA/CPAP: No  ASA/Blood Thinner: No  Covid test not needed  Anesthesia Review: No  Patient denies shortness of breath, fever, cough, and chest pain at PAT appointment.  Patient verbalized understanding of instructions provided today at the PAT appointment.  Patient asked to review instructions at home and day of surgery.

## 2020-11-02 NOTE — Progress Notes (Signed)
Surgical Instructions    Your procedure is scheduled on 11/08/20.  Report to [facility] Main Entrance "A" at 12:20 P.M., then check in with the Admitting office.  Call this number if you have problems the morning of surgery:  [phone]   If you have any questions prior to your surgery date call [phone]: Open Monday-Friday 8am-4pm    Remember:  Do not eat or drink after midnight the night before your surgery   Take these medicines the morning of surgery with A SIP OF WATER: DULoxetine HCl  tiaGABine (GABITRIL) gabapentin (NEURONTIN) - as needed ondansetron (ZOFRAN ODT) - as needed   As of today, STOP taking any Aspirin (unless otherwise instructed by your surgeon) Aleve, Naproxen, Ibuprofen, Motrin, Advil, Goody's, BC's, all herbal medications, fish oil, and all vitamins.  WHAT DO I DO ABOUT MY DIABETES MEDICATION?   Do not take oral diabetes medicines (pills) the morning of surgery. DO NOT TAKE METFORMIN DAY OF SURGERY   HOW TO MANAGE YOUR DIABETES BEFORE AND AFTER SURGERY  Why is it important to control my blood sugar before and after surgery? Improving blood sugar levels before and after surgery helps healing and can limit problems. A way of improving blood sugar control is eating a healthy diet by:  Eating less sugar and carbohydrates  Increasing activity/exercise  Talking with your doctor about reaching your blood sugar goals High blood sugars (greater than 180 mg/dL) can raise your risk of infections and slow your recovery, so you will need to focus on controlling your diabetes during the weeks before surgery. Make sure that the doctor who takes care of your diabetes knows about your planned surgery including the date and location.  How do I manage my blood sugar before surgery? Check your blood sugar at least 4 times a day, starting 2 days before surgery, to make sure that the level is not too high or low.  Check your blood sugar the morning of your surgery  when you wake up and every 2 hours until you get to the Short Stay unit.  If your blood sugar is less than 70 mg/dL, you will need to treat for low blood sugar: Do not take insulin. Treat a low blood sugar (less than 70 mg/dL) with  cup of clear juice (cranberry or apple), 4 glucose tablets, OR glucose gel. Recheck blood sugar in 15 minutes after treatment (to make sure it is greater than 70 mg/dL). If your blood sugar is not greater than 70 mg/dL on recheck, call [phone] for further instructions. Report your blood sugar to the short stay nurse when you get to Short Stay.  If you are admitted to the hospital after surgery: Your blood sugar will be checked by the staff and you will probably be given insulin after surgery (instead of oral diabetes medicines) to make sure you have good blood sugar levels. The goal for blood sugar control after surgery is 80-180 mg/dL.           Do not wear jewelry or makeup Do not wear lotions, powders, perfumes or deodorant. Do not shave 48 hours prior to surgery.  Do not bring valuables to the hospital. DO Not wear nail polish, gel polish, artificial nails, or any other type of covering on  natural nails including finger and toenails. If patients have artificial nails, gel coating, etc. that need to be removed by a nail salon please have this removed prior to surgery or surgery may need to be canceled/delayed if the  surgeon/ anesthesia feels like the patient is unable to be adequately monitored.             [facility] is not responsible for any belongings or valuables.  Do NOT Smoke (Tobacco/Vaping) or drink Alcohol 24 hours prior to your procedure If you use a CPAP at night, you may bring all equipment for your overnight stay.   Contacts, glasses, dentures or bridgework may not be worn into surgery, please bring cases for these belongings   For patients admitted to the hospital, discharge time will be determined by your treatment team.   Patients  discharged the day of surgery will not be allowed to drive home, and someone needs to stay with them for 24 hours.  ONLY 1 SUPPORT PERSON MAY BE PRESENT WHILE YOU ARE IN SURGERY. IF YOU ARE TO BE ADMITTED ONCE YOU ARE IN YOUR ROOM YOU WILL BE ALLOWED TWO (2) VISITORS.  Minor children may have two parents present. Special consideration for safety and communication needs will be reviewed on a case by case basis.  Special instructions:    Oral Hygiene is also important to reduce your risk of infection.  Remember - BRUSH YOUR TEETH THE MORNING OF SURGERY WITH YOUR REGULAR TOOTHPASTE   [facility]- Preparing For Surgery  Before surgery, you can play an important role. Because skin is not sterile, your skin needs to be as free of germs as possible. You can reduce the number of germs on your skin by washing with CHG (chlorahexidine gluconate) Soap before surgery.  CHG is an antiseptic cleaner which kills germs and bonds with the skin to continue killing germs even after washing.     Please do not use if you have an allergy to CHG or antibacterial soaps. If your skin becomes reddened/irritated stop using the CHG.  Do not shave (including legs and underarms) for at least 48 hours prior to first CHG shower. It is OK to shave your face.  Please follow these instructions carefully.     Shower the NIGHT BEFORE SURGERY and the MORNING OF SURGERY with CHG Soap.   If you chose to wash your hair, wash your hair first as usual with your normal shampoo. After you shampoo, rinse your hair and body thoroughly to remove the shampoo.  Then [employer] and genitals (private parts) with your normal soap and rinse thoroughly to remove soap.  After that Use CHG Soap as you would any other liquid soap. You can apply CHG directly to the skin and wash gently with a scrungie or a clean washcloth.   Apply the CHG Soap to your body ONLY FROM THE NECK DOWN.  Do not use on open wounds or open sores. Avoid contact with your  eyes, ears, mouth and genitals (private parts). Wash Face and genitals (private parts)  with your normal soap.   Wash thoroughly, paying special attention to the area where your surgery will be performed.  Thoroughly rinse your body with warm water from the neck down.  DO NOT shower/wash with your normal soap after using and rinsing off the CHG Soap.  Pat yourself dry with a CLEAN TOWEL.  Wear CLEAN PAJAMAS to bed the night before surgery  Place CLEAN SHEETS on your bed the night before your surgery  DO NOT SLEEP WITH PETS.   Day of Surgery:  Take a shower with CHG soap. Wear Clean/Comfortable clothing the morning of surgery Do not apply any deodorants/lotions.   Remember to brush your teeth WITH  YOUR REGULAR TOOTHPASTE.   Please read over the following fact sheets that you were given.  11/08/20

## 2020-11-04 ENCOUNTER — Encounter: Payer: Self-pay | Admitting: Family Medicine

## 2020-11-04 NOTE — Progress Notes (Deleted)
   I, Peterson Lombard, LAT, ATC acting as a scribe for [name], MD.  Gwendolyn [name] is a 50 y.o. female who presents to [place] at [facility] today for L shoulder pain x. Pt was previously seen by Dr. Georgina Snell on 09/26/20 for her R shoulder and was referred to orthopedics and has upcoming surgery scheduled for 11/08/20. Pt locates pain to   Dx imaging: 04/24/20 C-spine MRI  03/31/20 C-spine XR  Pertinent review of systems: ***  Relevant historical information: ***   Exam:  There were no vitals taken for this visit. General: Well Developed, well nourished, and in no acute distress.   MSK: ***    Lab and Radiology Results Results for orders placed or performed during the hospital encounter of 11/02/20 (from the past 72 hour(s))  CBC     Status: None   Collection Time: 11/02/20 10:55 AM  Result Value Ref Range   WBC 9.8 4.0 - 10.5 K/uL   RBC 4.66 3.87 - 5.11 MIL/uL   Hemoglobin 14.5 12.0 - 15.0 g/dL   HCT 45.3 36.0 - 46.0 %   MCV 97.2 80.0 - 100.0 fL   MCH 31.1 26.0 - 34.0 pg   MCHC 32.0 30.0 - 36.0 g/dL   RDW 14.1 11.5 - 15.5 %   Platelets 315 150 - 400 K/uL   nRBC 0.0 0.0 - 0.2 %    Comment: Performed at [facility], [place] [address], [place], [place] [address]  Basic metabolic panel     Status: Abnormal   Collection Time: 11/02/20 10:55 AM  Result Value Ref Range   Sodium 135 135 - 145 mmol/L   Potassium 4.0 3.5 - 5.1 mmol/L   Chloride 102 98 - 111 mmol/L   CO2 26 22 - 32 mmol/L   Glucose, Bld 100 (H) 70 - 99 mg/dL    Comment: Glucose reference range applies only to samples taken after fasting for at least 8 hours.   BUN 11 6 - 20 mg/dL   Creatinine, Ser 0.87 0.44 - 1.00 mg/dL   Calcium 9.0 8.9 - 10.3 mg/dL   GFR, Estimated >60 >60 mL/min    Comment: (NOTE) Calculated using the CKD-EPI Creatinine Equation (2021)    Anion gap 7 5 - 15    Comment: Performed at [facility] [address] [place], [place] [address]   No results  found.     Assessment and Plan: 50 y.o. female with ***   PDMP not reviewed this encounter. No orders of the defined types were placed in this encounter.  No orders of the defined types were placed in this encounter.    Discussed warning signs or symptoms. Please see discharge instructions. Patient expresses understanding.   ***

## 2020-11-07 ENCOUNTER — Ambulatory Visit: Payer: BC Managed Care – PPO | Admitting: Family Medicine

## 2020-11-07 NOTE — Progress Notes (Signed)
Patient voiced understanding of new arrival time of 47.  Voiced understanding of ERAS, npo after midnight, clear liquids okay until [phone]

## 2020-11-08 ENCOUNTER — Encounter (HOSPITAL_COMMUNITY)
Admission: RE | Disposition: A | Discharge status: Home / Self Care | Payer: Self-pay | Source: Home / Self Care | Attending: Orthopedic Surgery

## 2020-11-08 ENCOUNTER — Ambulatory Visit (HOSPITAL_COMMUNITY)
Admission: RE | Admit: 2020-11-08 | Discharge: 2020-11-08 | Disposition: A | Discharge status: Home / Self Care | Payer: BC Managed Care – PPO | Attending: Orthopedic Surgery | Admitting: Orthopedic Surgery

## 2020-11-08 ENCOUNTER — Other Ambulatory Visit: Payer: Self-pay

## 2020-11-08 ENCOUNTER — Encounter (HOSPITAL_COMMUNITY): Payer: Self-pay | Admitting: Orthopedic Surgery

## 2020-11-08 ENCOUNTER — Ambulatory Visit (HOSPITAL_COMMUNITY): Payer: BC Managed Care – PPO | Admitting: Anesthesiology

## 2020-11-08 DIAGNOSIS — Z8249 Family history of ischemic heart disease and other diseases of the circulatory system: Secondary | ICD-10-CM | POA: Insufficient documentation

## 2020-11-08 DIAGNOSIS — Z880 Allergy status to penicillin: Secondary | ICD-10-CM | POA: Insufficient documentation

## 2020-11-08 DIAGNOSIS — Y939 Activity, unspecified: Secondary | ICD-10-CM | POA: Diagnosis not present

## 2020-11-08 DIAGNOSIS — Z79899 Other long term (current) drug therapy: Secondary | ICD-10-CM | POA: Insufficient documentation

## 2020-11-08 DIAGNOSIS — X58XXXA Exposure to other specified factors, initial encounter: Secondary | ICD-10-CM | POA: Diagnosis not present

## 2020-11-08 DIAGNOSIS — Z800 Family history of malignant neoplasm of digestive organs: Secondary | ICD-10-CM | POA: Insufficient documentation

## 2020-11-08 DIAGNOSIS — M797 Fibromyalgia: Secondary | ICD-10-CM | POA: Diagnosis not present

## 2020-11-08 DIAGNOSIS — Z885 Allergy status to narcotic agent status: Secondary | ICD-10-CM | POA: Diagnosis not present

## 2020-11-08 DIAGNOSIS — F17200 Nicotine dependence, unspecified, uncomplicated: Secondary | ICD-10-CM | POA: Diagnosis not present

## 2020-11-08 DIAGNOSIS — Z820 Family history of epilepsy and other diseases of the nervous system: Secondary | ICD-10-CM | POA: Insufficient documentation

## 2020-11-08 DIAGNOSIS — Z808 Family history of malignant neoplasm of other organs or systems: Secondary | ICD-10-CM | POA: Diagnosis not present

## 2020-11-08 DIAGNOSIS — Z853 Personal history of malignant neoplasm of breast: Secondary | ICD-10-CM | POA: Insufficient documentation

## 2020-11-08 DIAGNOSIS — Z7984 Long term (current) use of oral hypoglycemic drugs: Secondary | ICD-10-CM | POA: Insufficient documentation

## 2020-11-08 DIAGNOSIS — F17210 Nicotine dependence, cigarettes, uncomplicated: Secondary | ICD-10-CM | POA: Insufficient documentation

## 2020-11-08 DIAGNOSIS — S46011A Strain of muscle(s) and tendon(s) of the rotator cuff of right shoulder, initial encounter: Secondary | ICD-10-CM | POA: Insufficient documentation

## 2020-11-08 DIAGNOSIS — M19011 Primary osteoarthritis, right shoulder: Secondary | ICD-10-CM | POA: Insufficient documentation

## 2020-11-08 DIAGNOSIS — Z791 Long term (current) use of non-steroidal anti-inflammatories (NSAID): Secondary | ICD-10-CM | POA: Insufficient documentation

## 2020-11-08 HISTORY — PX: SHOULDER ARTHROSCOPY WITH DISTAL CLAVICLE RESECTION: SHX5675

## 2020-11-08 SURGERY — SHOULDER ARTHROSCOPY WITH DISTAL CLAVICLE RESECTION
Anesthesia: Regional | Site: Shoulder | Laterality: Right

## 2020-11-08 MED ORDER — FENTANYL CITRATE (PF) 250 MCG/5ML IJ SOLN
INTRAMUSCULAR | Status: DC | PRN
  Administered 2020-11-08: 50 ug via INTRAVENOUS

## 2020-11-08 MED ORDER — SODIUM CHLORIDE FLUSH 0.9 % IV SOLN
INTRAVENOUS | Status: AC
  Filled 2020-11-08: qty 20

## 2020-11-08 MED ORDER — FENTANYL CITRATE (PF) 100 MCG/2ML IJ SOLN
INTRAMUSCULAR | Status: AC
  Administered 2020-11-08: 50 ug via INTRAVENOUS
  Filled 2020-11-08: qty 2

## 2020-11-08 MED ORDER — TRANEXAMIC ACID-NACL 1000-0.7 MG/100ML-% IV SOLN
INTRAVENOUS | Status: DC | PRN
  Administered 2020-11-08: 1000 mg via INTRAVENOUS

## 2020-11-08 MED ORDER — ONDANSETRON HCL 4 MG/2ML IJ SOLN
INTRAMUSCULAR | Status: DC | PRN
  Administered 2020-11-08: 4 mg via INTRAVENOUS

## 2020-11-08 MED ORDER — EPINEPHRINE PF 1 MG/ML IJ SOLN
INTRAMUSCULAR | Status: AC
  Filled 2020-11-08: qty 5

## 2020-11-08 MED ORDER — CHLORHEXIDINE GLUCONATE 0.12 % MT SOLN: 15.0000 mL | Freq: Once | OROMUCOSAL | Status: AC

## 2020-11-08 MED ORDER — ALBUMIN HUMAN 5 % IV SOLN: INTRAVENOUS | Status: DC | PRN

## 2020-11-08 MED ORDER — CEFAZOLIN SODIUM-DEXTROSE 2-4 GM/100ML-% IV SOLN
2.0000 g | INTRAVENOUS | Status: AC
  Administered 2020-11-08: 2 g via INTRAVENOUS

## 2020-11-08 MED ORDER — POVIDONE-IODINE 7.5 % EX SOLN
Freq: Once | CUTANEOUS | Status: DC
  Filled 2020-11-08: qty 118

## 2020-11-08 MED ORDER — DEXAMETHASONE SODIUM PHOSPHATE 10 MG/ML IJ SOLN
INTRAMUSCULAR | Status: DC | PRN
  Administered 2020-11-08: 10 mg via INTRAVENOUS

## 2020-11-08 MED ORDER — OXYCODONE-ACETAMINOPHEN 5-325 MG PO TABS
1.0000 | ORAL_TABLET | ORAL | 0 refills | Status: DC | PRN
Start: 2020-11-08 — End: 2020-11-10

## 2020-11-08 MED ORDER — ROCURONIUM BROMIDE 10 MG/ML (PF) SYRINGE
PREFILLED_SYRINGE | INTRAVENOUS | Status: DC | PRN
  Administered 2020-11-08: 70 mg via INTRAVENOUS

## 2020-11-08 MED ORDER — ONDANSETRON HCL 4 MG/2ML IJ SOLN
INTRAMUSCULAR | Status: AC
  Filled 2020-11-08: qty 2

## 2020-11-08 MED ORDER — CHLORHEXIDINE GLUCONATE 0.12 % MT SOLN
OROMUCOSAL | Status: AC
  Administered 2020-11-08: 15 mL via OROMUCOSAL
  Filled 2020-11-08: qty 15

## 2020-11-08 MED ORDER — LACTATED RINGERS IV SOLN: INTRAVENOUS | Status: DC

## 2020-11-08 MED ORDER — FENTANYL CITRATE (PF) 100 MCG/2ML IJ SOLN
INTRAMUSCULAR | Status: AC
  Filled 2020-11-08: qty 2

## 2020-11-08 MED ORDER — PROMETHAZINE HCL 25 MG/ML IJ SOLN: 6.2500 mg | INTRAMUSCULAR | Status: DC | PRN

## 2020-11-08 MED ORDER — PROPOFOL 10 MG/ML IV BOLUS
INTRAVENOUS | Status: DC | PRN
  Administered 2020-11-08: 150 mg via INTRAVENOUS

## 2020-11-08 MED ORDER — SUGAMMADEX SODIUM 200 MG/2ML IV SOLN
INTRAVENOUS | Status: DC | PRN
  Administered 2020-11-08: 200 mg via INTRAVENOUS

## 2020-11-08 MED ORDER — SODIUM CHLORIDE (PF) 0.9 % IJ SOLN
INTRAMUSCULAR | Status: DC | PRN
  Administered 2020-11-08 (×2): 10 mL

## 2020-11-08 MED ORDER — FENTANYL CITRATE (PF) 100 MCG/2ML IJ SOLN
25.0000 ug | INTRAMUSCULAR | Status: DC | PRN
  Administered 2020-11-08: 50 ug via INTRAVENOUS

## 2020-11-08 MED ORDER — LIDOCAINE 2% (20 MG/ML) 5 ML SYRINGE
INTRAMUSCULAR | Status: AC
  Filled 2020-11-08: qty 5

## 2020-11-08 MED ORDER — BUPIVACAINE HCL (PF) 0.5 % IJ SOLN
INTRAMUSCULAR | Status: DC | PRN
  Administered 2020-11-08: 15 mL via PERINEURAL

## 2020-11-08 MED ORDER — FENTANYL CITRATE (PF) 250 MCG/5ML IJ SOLN
INTRAMUSCULAR | Status: AC
  Filled 2020-11-08: qty 5

## 2020-11-08 MED ORDER — ACETAMINOPHEN 500 MG PO TABS
1000.0000 mg | ORAL_TABLET | Freq: Once | ORAL | Status: AC
  Administered 2020-11-08: 1000 mg via ORAL
  Filled 2020-11-08: qty 2

## 2020-11-08 MED ORDER — BUPIVACAINE LIPOSOME 1.3 % IJ SUSP
INTRAMUSCULAR | Status: DC | PRN
  Administered 2020-11-08: 10 mL via PERINEURAL

## 2020-11-08 MED ORDER — DROPERIDOL 2.5 MG/ML IJ SOLN: 0.6250 mg | Freq: Once | INTRAMUSCULAR | Status: DC | PRN

## 2020-11-08 MED ORDER — MIDAZOLAM HCL 2 MG/2ML IJ SOLN
INTRAMUSCULAR | Status: AC
  Administered 2020-11-08: 2 mg via INTRAVENOUS
  Filled 2020-11-08: qty 2

## 2020-11-08 MED ORDER — MIDAZOLAM HCL 2 MG/2ML IJ SOLN: 2.0000 mg | Freq: Once | INTRAMUSCULAR | Status: AC

## 2020-11-08 MED ORDER — MIDAZOLAM HCL 2 MG/2ML IJ SOLN
INTRAMUSCULAR | Status: DC | PRN
  Administered 2020-11-08: 2 mg via INTRAVENOUS

## 2020-11-08 MED ORDER — PROPOFOL 10 MG/ML IV BOLUS
INTRAVENOUS | Status: AC
  Filled 2020-11-08: qty 20

## 2020-11-08 MED ORDER — SCOPOLAMINE 1 MG/3DAYS TD PT72
1.0000 | MEDICATED_PATCH | TRANSDERMAL | Status: DC
  Administered 2020-11-08: 1.5 mg via TRANSDERMAL
  Filled 2020-11-08: qty 1

## 2020-11-08 MED ORDER — TRANEXAMIC ACID-NACL 1000-0.7 MG/100ML-% IV SOLN
INTRAVENOUS | Status: AC
  Filled 2020-11-08: qty 100

## 2020-11-08 MED ORDER — MIDAZOLAM HCL 2 MG/2ML IJ SOLN
INTRAMUSCULAR | Status: AC
  Filled 2020-11-08: qty 2

## 2020-11-08 MED ORDER — DEXAMETHASONE SODIUM PHOSPHATE 10 MG/ML IJ SOLN
INTRAMUSCULAR | Status: AC
  Filled 2020-11-08: qty 1

## 2020-11-08 MED ORDER — PHENYLEPHRINE HCL-NACL 10-0.9 MG/250ML-% IV SOLN
INTRAVENOUS | Status: DC | PRN
  Administered 2020-11-08: 30 ug/min via INTRAVENOUS

## 2020-11-08 MED ORDER — ORAL CARE MOUTH RINSE: 15.0000 mL | Freq: Once | OROMUCOSAL | Status: AC

## 2020-11-08 MED ORDER — METHOCARBAMOL 500 MG PO TABS: 500.0000 mg | ORAL_TABLET | Freq: Three times a day (TID) | ORAL | 0 refills | Status: DC | PRN

## 2020-11-08 MED ORDER — LIDOCAINE 2% (20 MG/ML) 5 ML SYRINGE
INTRAMUSCULAR | Status: DC | PRN
  Administered 2020-11-08: 60 mg via INTRAVENOUS

## 2020-11-08 MED ORDER — POVIDONE-IODINE 10 % EX SWAB
2.0000 | Freq: Once | CUTANEOUS | Status: AC
Start: 2020-11-08 — End: 2020-11-08
  Administered 2020-11-08: 2 via TOPICAL

## 2020-11-08 MED ORDER — EPINEPHRINE PF 1 MG/ML IJ SOLN
INTRAMUSCULAR | Status: DC | PRN
  Administered 2020-11-08 (×2): 1 mg
  Administered 2020-11-08: .15 mL
  Administered 2020-11-08 (×2): 1 mg

## 2020-11-08 MED ORDER — CEFAZOLIN SODIUM-DEXTROSE 2-4 GM/100ML-% IV SOLN
INTRAVENOUS | Status: AC
  Filled 2020-11-08: qty 100

## 2020-11-08 MED ORDER — ROCURONIUM BROMIDE 10 MG/ML (PF) SYRINGE
PREFILLED_SYRINGE | INTRAVENOUS | Status: AC
  Filled 2020-11-08: qty 10

## 2020-11-08 MED ORDER — SODIUM CHLORIDE 0.9 % IR SOLN
Status: DC | PRN
  Administered 2020-11-08 (×4): 3000 mL

## 2020-11-08 MED ORDER — FENTANYL CITRATE (PF) 100 MCG/2ML IJ SOLN: 50.0000 ug | Freq: Once | INTRAMUSCULAR | Status: AC

## 2020-11-08 SURGICAL SUPPLY — 70 items
APL SKNCLS STERI-STRIP NONHPOA (GAUZE/BANDAGES/DRESSINGS)
BAG COUNTER SPONGE SURGICOUNT (BAG) ×2 IMPLANT
BAG SPNG CNTER NS LX DISP (BAG) ×1
BENZOIN TINCTURE PRP APPL 2/3 (GAUZE/BANDAGES/DRESSINGS) IMPLANT
BIT DRILL TAK (DRILL) IMPLANT
BLADE CUDA 5.5 (BLADE) IMPLANT
BLADE CUTTER GATOR 3.5 (BLADE) ×2 IMPLANT
BLADE EXCALIBUR 4.0X13 (MISCELLANEOUS) ×2 IMPLANT
BLADE SURG 11 STRL SS (BLADE) ×2 IMPLANT
BUR GATOR 2.9 (BURR) IMPLANT
BUR OVAL 6.0 (BURR) ×2 IMPLANT
CANNULA 5.75X71 LONG (CANNULA) ×2 IMPLANT
CANNULA SHOULDER 7CM (CANNULA) IMPLANT
CANNULA TWIST IN 8.25X7CM (CANNULA) IMPLANT
COVER SURGICAL LIGHT HANDLE (MISCELLANEOUS) ×2 IMPLANT
DRAPE INCISE IOBAN 66X45 STRL (DRAPES) ×4 IMPLANT
DRAPE STERI 35X30 U-POUCH (DRAPES) ×2 IMPLANT
DRAPE U-SHAPE 47X51 STRL (DRAPES) ×4 IMPLANT
DRILL TAK (DRILL)
DRSG PAD ABDOMINAL 8X10 ST (GAUZE/BANDAGES/DRESSINGS) ×2 IMPLANT
DRSG TEGADERM 4X4.75 (GAUZE/BANDAGES/DRESSINGS) ×2 IMPLANT
DURAPREP 26ML APPLICATOR (WOUND CARE) ×2 IMPLANT
ELECT MENISCUS 165MM 90D (ELECTRODE) IMPLANT
ELECT REM PT RETURN 9FT ADLT (ELECTROSURGICAL) ×2
ELECTRODE REM PT RTRN 9FT ADLT (ELECTROSURGICAL) ×1 IMPLANT
FILTER STRAW FLUID ASPIR (MISCELLANEOUS) ×2 IMPLANT
GAUZE SPONGE 4X4 12PLY STRL (GAUZE/BANDAGES/DRESSINGS) ×2 IMPLANT
GAUZE XEROFORM 1X8 LF (GAUZE/BANDAGES/DRESSINGS) ×2 IMPLANT
GLOVE SRG 8 PF TXTR STRL LF DI (GLOVE) ×1 IMPLANT
GLOVE SURG LTX SZ8 (GLOVE) ×2 IMPLANT
GLOVE SURG UNDER POLY LF SZ8 (GLOVE) ×2
GOWN STRL REUS W/ TWL LRG LVL3 (GOWN DISPOSABLE) ×3 IMPLANT
GOWN STRL REUS W/TWL LRG LVL3 (GOWN DISPOSABLE) ×6
KIT BASIN OR (CUSTOM PROCEDURE TRAY) ×2 IMPLANT
KIT TURNOVER KIT B (KITS) ×2 IMPLANT
LASSO 90 CVE QUICKPAS (DISPOSABLE) IMPLANT
MANIFOLD NEPTUNE II (INSTRUMENTS) ×2 IMPLANT
NDL SUT 6 .5 CRC .975X.05 MAYO (NEEDLE) ×1 IMPLANT
NEEDLE HYPO 25X1 1.5 SAFETY (NEEDLE) ×2 IMPLANT
NEEDLE MAYO TAPER (NEEDLE) ×2
NEEDLE SPNL 18GX3.5 QUINCKE PK (NEEDLE) ×2 IMPLANT
NS IRRIG 1000ML POUR BTL (IV SOLUTION) IMPLANT
PACK SHOULDER (CUSTOM PROCEDURE TRAY) ×2 IMPLANT
PAD ARMBOARD 7.5X6 YLW CONV (MISCELLANEOUS) ×4 IMPLANT
PORT APPOLLO RF 90DEGREE MULTI (SURGICAL WAND) IMPLANT
SLING ARM IMMOBILIZER MED (SOFTGOODS) IMPLANT
SPEAR FASTAKII (SLEEVE) IMPLANT
SPONGE T-LAP 4X18 ~~LOC~~+RFID (SPONGE) ×4 IMPLANT
STRIP CLOSURE SKIN 1/2X4 (GAUZE/BANDAGES/DRESSINGS) IMPLANT
SUCTION FRAZIER HANDLE 10FR (MISCELLANEOUS) ×1
SUCTION TUBE FRAZIER 10FR DISP (MISCELLANEOUS) ×1 IMPLANT
SUT ETHILON 3 0 PS 1 (SUTURE) ×6 IMPLANT
SUT FIBERWIRE 2-0 18 17.9 3/8 (SUTURE)
SUT PROLENE 3 0 PS 2 (SUTURE) ×2 IMPLANT
SUT VIC AB 0 CT1 27 (SUTURE) ×4
SUT VIC AB 0 CT1 27XBRD ANBCTR (SUTURE) ×2 IMPLANT
SUT VIC AB 1 CT1 27 (SUTURE) ×2
SUT VIC AB 1 CT1 27XBRD ANBCTR (SUTURE) ×1 IMPLANT
SUT VIC AB 2-0 CT1 27 (SUTURE) ×2
SUT VIC AB 2-0 CT1 TAPERPNT 27 (SUTURE) ×1 IMPLANT
SUT VICRYL 0 UR6 27IN ABS (SUTURE) IMPLANT
SUTURE FIBERWR 2-0 18 17.9 3/8 (SUTURE) IMPLANT
SYR 20ML LL LF (SYRINGE) ×4 IMPLANT
SYR 3ML LL SCALE MARK (SYRINGE) ×2 IMPLANT
SYR TB 1ML LUER SLIP (SYRINGE) ×2 IMPLANT
TOWEL GREEN STERILE (TOWEL DISPOSABLE) ×2 IMPLANT
TOWEL GREEN STERILE FF (TOWEL DISPOSABLE) ×2 IMPLANT
TUBING ARTHROSCOPY IRRIG 16FT (MISCELLANEOUS) ×2 IMPLANT
WAND STAR VAC 90 (SURGICAL WAND) ×2 IMPLANT
WATER STERILE IRR 1000ML POUR (IV SOLUTION) ×2 IMPLANT

## 2020-11-08 NOTE — Anesthesia Procedure Notes (Signed)
Procedure Name: Intubation Date/Time: 11/08/2020 11:20 AM Performed by: Michele Rockers, CRNA Pre-anesthesia Checklist: Patient identified, Patient being monitored, Timeout performed, Emergency Drugs available and Suction available Patient Re-evaluated:Patient Re-evaluated prior to induction Oxygen Delivery Method: Circle system utilized Preoxygenation: Pre-oxygenation with 100% oxygen Induction Type: IV induction Ventilation: Mask ventilation without difficulty Laryngoscope Size: Miller and 2 Grade View: Grade I Tube type: Oral Tube size: 7.5 mm Number of attempts: 1 Airway Equipment and Method: Stylet Placement Confirmation: ETT inserted through vocal cords under direct vision, positive ETCO2 and breath sounds checked- equal and bilateral Secured at: 21 cm Tube secured with: Tape Dental Injury: Teeth and Oropharynx as per pre-operative assessment

## 2020-11-08 NOTE — Anesthesia Preprocedure Evaluation (Addendum)
Anesthesia Evaluation  Patient identified by MRN, date of birth, ID band Patient awake    Reviewed: Allergy & Precautions, NPO status , Patient's Chart, lab work & pertinent test results  Airway Mallampati: II  TM Distance: >3 FB Neck ROM: Full    Dental no notable dental hx.    Pulmonary sleep apnea , Current Smoker and Patient abstained from smoking.,    Pulmonary exam normal breath sounds clear to auscultation       Cardiovascular Exercise Tolerance: Good negative cardio ROS Normal cardiovascular exam Rhythm:Regular Rate:Normal     Neuro/Psych  Headaches, PSYCHIATRIC DISORDERS Anxiety Depression Bipolar Disorder    GI/Hepatic Neg liver ROS, GERD  ,  Endo/Other  negative endocrine ROS  Renal/GU negative Renal ROS  negative genitourinary   Musculoskeletal  (+) Arthritis , Osteoarthritis,  Fibromyalgia -  Abdominal   Peds negative pediatric ROS (+)  Hematology negative hematology ROS (+)   Anesthesia Other Findings H/o breast cancer  Reproductive/Obstetrics negative OB ROS                           Anesthesia Physical Anesthesia Plan  ASA: 2  Anesthesia Plan: General and Regional   Post-op Pain Management: GA combined w/ Regional for post-op pain   Induction: Intravenous  PONV Risk Score and Plan: 2 and Midazolam, Treatment may vary due to age or medical condition, Scopolamine patch - Pre-op, Ondansetron and Dexamethasone  Airway Management Planned: Oral ETT  Additional Equipment:   Intra-op Plan:   Post-operative Plan: Extubation in OR  Informed Consent: I have reviewed the patients History and Physical, chart, labs and discussed the procedure including the risks, benefits and alternatives for the proposed anesthesia with the patient or authorized representative who has indicated his/her understanding and acceptance.     Dental advisory given  Plan Discussed with: CRNA,  Anesthesiologist and Surgeon  Anesthesia Plan Comments: (Interscalene block with exparel. GETA. )        Anesthesia Quick Evaluation

## 2020-11-08 NOTE — Brief Op Note (Signed)
   11/08/2020  1:08 PM  PATIENT:  [name]  50 y.o. female  PRE-OPERATIVE DIAGNOSIS:  right shoulder acromioclavicular osteoarthritis  POST-OPERATIVE DIAGNOSIS:  right shoulder acromioclavicular osteoarthritis  PROCEDURE:  Procedure(s): RIGHT SHOULDER ARTHROSCOPY, ARTHROSCOPIC DISTAL CLAVICLE EXCISION  SURGEON:  Surgeon(s): Marlou Sa, Tonna Corner, MD  ASSISTANT: magnant pa  ANESTHESIA:   general  EBL: 10 ml    Total I/O In: 850 [I.V.:500; IV Piggyback:350] Out: 50 [Blood:50]  BLOOD ADMINISTERED: none  DRAINS: none   LOCAL MEDICATIONS USED:  none  SPECIMEN:  No Specimen  COUNTS:  YES  TOURNIQUET:  * No tourniquets in log *  DICTATION: .Other Dictation: Dictation Number [id number]  PLAN OF CARE: Discharge to home after PACU  PATIENT DISPOSITION:  PACU - hemodynamically stable

## 2020-11-08 NOTE — Op Note (Signed)
NAMEMARLISHA, [name] MEDICAL RECORD NO: [id number] ACCOUNT NO: [id number] DATE OF BIRTH: [birth year] FACILITY: [facility] LOCATION: [facility] PHYSICIAN: Yetta Barre. Marlou Sa, MD  Operative Report   DATE OF PROCEDURE: 11/02/2020  PREOPERATIVE DIAGNOSIS:  Right shoulder acromioclavicular joint arthritis.  POSTOPERATIVE DIAGNOSIS:  Right shoulder acromioclavicular joint arthritis and minimal degeneration, articular-sided tear of the supraspinatus measuring about 15% thickness.  PROCEDURE:  Right shoulder arthroscopy with minimal debridement of that undersurface articular-sided supraspinatus tear and arthroscopic distal clavicle excision.  SURGEON ATTENDING:  Yetta Barre. Marlou Sa, MD  ASSISTANT:  Annie Main.  INDICATIONS:  The patient is a 50 year old patient with right shoulder pain with significant AC joint edema and arthritis with positive response to injection.  She presents now for operative management after explanation of risks and benefits.  DESCRIPTION OF PROCEDURE:  The patient was brought to the operating room where general anesthetic was induced.  Preoperative antibiotics administered.  Timeout was called.  Right shoulder examined under anesthesia and found to have full active and  passive range of motion with good stability.  Right shoulder was pre-scrubbed with alcohol, Betadine, and allowed to air dry, prepped with DuraPrep solution and draped in sterile manner.  [name] was used to seal the operative field.  Timeout was called.   Injection of saline and epinephrine placed into the subacromial space.  Posterior portal created 2 cm inferior and medial to the posterolateral margin of the acromion.  Diagnostic arthroscopy was performed.  Articular surfaces intact.  Partial thickness  tear of the infraspinatus measuring about 1 cm x 8 mm was noted.  This was debrided.  It involved at most 15-20% of the thickness of the tendon.  Following debridement, the labrum was found to be stable.  Anterior  inferior, posterior inferior  glenohumeral ligaments were intact.  Next, an anterior portal was created for probing.  This portal was in line with the distal end of the clavicle.  Scope was then placed into the subacromial space.  Bursectomy was performed.  Distal end of the clavicle  exposed.  Approximately 10 mm of the distal end of the clavicle was removed with preservation of the posterior and superior ligaments.  Confirmed under direct visualization.  After this, the joint was irrigated.  Instruments were removed.  Portals were  closed x3 using 3-0 Vicryl, 3-0 nylon.  Impervious dressing was placed.  The patient tolerated the procedure well without immediate complications, was transferred to the recovery room in stable condition.  [name]'s assistance was required for positioning,  opening and closing.  His assistance was necessary for arthroscopy.  His assistance was a medical necessity.   PUS D: 11/08/2020 1:12:21 pm T: 11/08/2020 1:38:00 pm  JOB: [id number]/ [id number]

## 2020-11-08 NOTE — Anesthesia Procedure Notes (Signed)
Anesthesia Regional Block: Interscalene brachial plexus block   Pre-Anesthetic Checklist: , timeout performed,  Correct Patient, Correct Site, Correct Laterality,  Correct Procedure, Correct Position, site marked,  Risks and benefits discussed,  Surgical consent,  Pre-op evaluation,  At surgeon's request and post-op pain management  Laterality: Right  Prep: chloraprep       Needles:  Injection technique: Single-shot  Needle Type: Echogenic Stimulator Needle     Needle Length: 10cm  Needle Gauge: 20     Additional Needles:   Procedures:,,,, ultrasound used (permanent image in chart),,    Narrative:  Start time: 11/08/2020 10:05 AM End time: 11/08/2020 10:10 AM Injection made incrementally with aspirations every 5 mL.  Performed by: Personally  Anesthesiologist: Lidia Collum, MD  Additional Notes: Standard monitors applied. Skin prepped. Good needle visualization with ultrasound. Injection made in 5cc increments with no resistance to injection. Patient tolerated the procedure well.

## 2020-11-08 NOTE — Anesthesia Postprocedure Evaluation (Signed)
Anesthesia Post Note  Patient: Retail banker  Procedure(s) Performed: RIGHT SHOULDER ARTHROSCOPY, ARTHROSCOPIC DISTAL CLAVICLE EXCISION (Right: Shoulder)     Patient location during evaluation: PACU Anesthesia Type: Regional and General Level of consciousness: awake and alert Pain management: pain level controlled Vital Signs Assessment: post-procedure vital signs reviewed and stable Respiratory status: spontaneous breathing, nonlabored ventilation and respiratory function stable Cardiovascular status: blood pressure returned to baseline and stable Postop Assessment: no apparent nausea or vomiting Anesthetic complications: no   No notable events documented.  Last Vitals:  Vitals:   11/08/20 1335 11/08/20 1348  BP: 92/62 (!) 102/53  Pulse: 72 76  Resp: 17 18  Temp:  36.6 C  SpO2: 100% 100%    Last Pain:  Vitals:   11/08/20 1335  TempSrc:   PainSc: 4                  Candra R Tereza Gilham

## 2020-11-08 NOTE — Op Note (Deleted)
  The note originally documented on this encounter has been moved the the encounter in which it belongs.  

## 2020-11-08 NOTE — Transfer of Care (Signed)
Immediate Anesthesia Transfer of Care Note  Patient: Gwendolyn [name]  Procedure(s) Performed: RIGHT SHOULDER ARTHROSCOPY, ARTHROSCOPIC DISTAL CLAVICLE EXCISION (Right: Shoulder)  Patient Location: PACU  Anesthesia Type:General  Level of Consciousness: drowsy, patient cooperative and obtunded  Airway & Oxygen Therapy: Patient Spontanous Breathing and Patient connected to nasal cannula oxygen  Post-op Assessment: Report given to RN and Post -op Vital signs reviewed and stable  Post vital signs: Reviewed and stable  Last Vitals:  Vitals Value Taken Time  BP 102/46 11/08/20 1305  Temp 36.5 C 11/08/20 1305  Pulse 83 11/08/20 1309  Resp 19 11/08/20 1309  SpO2 100 % 11/08/20 1309  Vitals shown include unvalidated device data.  Last Pain:  Vitals:   11/08/20 1305  TempSrc:   PainSc: Asleep      Patients Stated Pain Goal: 4 (81/15/72 6203)  Complications: No notable events documented.

## 2020-11-08 NOTE — H&P (Signed)
Gwendolyn [name] is an 50 y.o. female.   Chief Complaint: right shoulder pain HPI: Patient  has had shoulder pain for about 2 years.  Denies any history of injury but she does do a lot of dance as well as a lot of fitness activity.  No wake  from sleep at night but she does localize the pain to the superior aspect of the right shoulder.  She had a cortisone injection into the [facility] joint 10 days ago which gave her significant relief.  Symptoms are worse in the morning in terms of range of motion.  Radiates down into the deltoid region.  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  She is going to be traveling to [place] to visit her daughter who is in school in [place].  Hard for her to hold a coffee cup at times because of that right shoulder pain.  MRI scan is reviewed and it shows intact rotator cuff intact labrum but intense edema and swelling around the right AC joint.  She starts back to school on August 18.  She does have a history of fibromyalgia  Past Medical History:  Diagnosis Date   Anal fissure    Anxiety    Arthritis    Breast cancer ([place]) 2002   lumpectomy-Right   Depression    Family history of brain cancer    Family history of colon cancer    Fibromyalgia    Gallstones    GERD (gastroesophageal reflux disease)    Irritable bowel syndrome    Motion sickness    cars, boats   Personal history of radiation therapy    Yeast vaginitis     Past Surgical History:  Procedure Laterality Date   ABDOMINAL HYSTERECTOMY     BREAST BIOPSY Right 2002   Breast cancer DCIS   BREAST BIOPSY     BREAST LUMPECTOMY Right 2003   Radiatiomn Therapy DCIS   CHOLECYSTECTOMY  2000   COLECTOMY  10/31/2012   SECONDARY TO COLONIC INERTIA   ESOPHAGOGASTRODUODENOSCOPY (EGD) WITH PROPOFOL N/A 05/11/2020   Procedure: ESOPHAGOGASTRODUODENOSCOPY (EGD) WITH PROPOFOL;  Surgeon: Virgel Manifold, MD;  Location: [facility];  Service: Endoscopy;   Laterality: N/A;   REDUCTION MAMMAPLASTY Bilateral 11/13/2018   right breast 38mm DCIS found during breast reduction   SUPRACERVICAL ABDOMINAL HYSTERECTOMY  04/10/2011   But still has ovaries.    Family History  Problem Relation Age of Onset   Meniere's disease Father    Alcohol abuse Sister    Heart disease Maternal Grandmother    Heart disease Maternal Grandfather    Brain cancer Maternal Grandfather    Heart disease Paternal Grandmother    Heart disease Paternal Grandfather    Rectal cancer Other        MGF's mother   Colon cancer Neg Hx    Esophageal cancer Neg Hx    Breast cancer Neg Hx    Social History:  reports that she has been smoking cigarettes. She has a 10.00 pack-year smoking history. She has never used smokeless tobacco. She reports current alcohol use. She reports that she does not use drugs.  Allergies:  Allergies  Allergen Reactions   Amoxicillin-Pot Clavulanate Nausea Only   Morphine Diarrhea and Nausea Only   Penicillins Diarrhea and Nausea Only    Medications Prior to Admission  Medication Sig Dispense Refill   amphetamine-dextroamphetamine (ADDERALL) 30 MG tablet Take 30 mg by mouth 2 (two) times daily.  0   APPLE CIDER VINEGAR PO Take 1,600 mg by mouth daily.     ASHWAGANDHA PO Take 3,000 mg by mouth at bedtime.     BLACK ELDERBERRY PO Take 2,000 mg by mouth daily.     Cholecalciferol (VITAMIN D) 50 MCG (2000 UT) tablet Take 2,000 Units by mouth daily.     CRANBERRY PO Take 30,000 mg by mouth at bedtime.     Cyanocobalamin (VITAMIN B-12) 5000 MCG TBDP Take 5,000 mcg by mouth daily.     DULoxetine HCl 40 MG CPEP Take 80 mg by mouth every morning.     Ferrous Sulfate (IRON PO) Take 25 mg by mouth daily.     FIBER PO Take 6 tablets by mouth in the morning and at bedtime.     gabapentin (NEURONTIN) 800 MG tablet Take 1 tablet (800 mg total) by mouth 3 (three) times daily as needed (nerve pain). 180 tablet 1   GINKGO BILOBA PO Take 3,000 mg by mouth in  the morning and at bedtime.     Melatonin 10 MG TABS Take 10 mg by mouth daily.     metFORMIN (GLUCOPHAGE-XR) 500 MG 24 hr tablet TAKE 1 TABLET BY MOUTH EVERY DAY WITH BREAKFAST 90 tablet 1   Probiotic Product (PROBIOTIC PO) Take 1 capsule by mouth daily.     QUEtiapine (SEROQUEL) 50 MG tablet Take 100 mg by mouth at bedtime.     tiaGABine (GABITRIL) 4 MG tablet Take 4-16 mg by mouth See admin instructions. Take 4 mg in the morning and 16 mg at bedtime     clotrimazole-betamethasone (LOTRISONE) cream Apply 1 application topically 2 (two) times daily. 30 g 0   dicyclomine (BENTYL) 10 MG capsule TAKE 1 CAPSULE (10 MG TOTAL) BY MOUTH 4 (FOUR) TIMES DAILY - BEFORE MEALS AND AT BEDTIME. (Patient not taking: No sig reported) 360 capsule 1   meloxicam (MOBIC) 15 MG tablet TAKE 1 TABLET BY MOUTH EVERY DAY (Patient not taking: No sig reported) 30 tablet 2   metroNIDAZOLE (METROGEL VAGINAL) 0.75 % vaginal gel Place 1 Applicatorful vaginally 2 (two) times daily. 70 g 0   omeprazole (PRILOSEC) 20 MG capsule TAKE 1 CAPSULE BY MOUTH EVERY DAY (Patient not taking: No sig reported) 30 capsule 0   ondansetron (ZOFRAN ODT) 4 MG disintegrating tablet Take 1 tablet (4 mg total) by mouth every 8 (eight) hours as needed for nausea or vomiting. 20 tablet 0    No results found for this or any previous visit (from the past 48 hour(s)). No results found.  Review of Systems  Musculoskeletal:  Positive for arthralgias.  All other systems reviewed and are negative.  Blood pressure (!) 98/59, pulse 62, temperature (!) 97.5 F (36.4 C), temperature source Oral, resp. rate 16, height 5\' 7"  (1.702 m), weight 80.3 kg, SpO2 98 %. Physical Exam Vitals reviewed.  HENT:     Head: Normocephalic.     Nose: Nose normal.     Mouth/Throat:     Mouth: Mucous membranes are moist.  Eyes:     Pupils: Pupils are equal, round, and reactive to light.  Cardiovascular:     Rate and Rhythm: Normal rate.     Pulses: Normal pulses.   Pulmonary:     Effort: Pulmonary effort is normal.  Abdominal:     General: Abdomen is flat.  Musculoskeletal:     Cervical back: Normal range of motion.  Skin:    General: Skin is warm.  Capillary Refill: Capillary refill takes less than 2 seconds.  Neurological:     General: No focal deficit present.     Mental Status: She is alert.  Psychiatric:        Mood and Affect: Mood normal.    Ortho exam demonstrates full active and passive range of motion of the cervical spine.  She does have tenderness to palpation on the right greater than left AC joint.  No restriction of passive external rotation 15 degrees of abduction.  Rotator cuff strength is intact infraspinatus supraspinatus subscap muscle testing.  Does have some clicking and grinding in the [facility] joint with passive range of motion as well as pain with crossarm adduction on the right.  No other masses lymphadenopathy or skin changes noted in that shoulder region Assessment/Plan Impression is right shoulder AC joint arthritis which is symptomatic but has responded well to an [facility] joint injection.  Starting to wear off to a slight degree.  Based on the amount of edema present I think she would likely remain symptomatic with that [facility] joint until arthroscopic excision.  We discussed operative and nonoperative treatment options but in general I think her best option would be arthroscopic distal clavicle excision.  The risk and benefits of the procedure are discussed with the patient including not limited to infection shoulder stiffness as well as incomplete pain relief.  Normal rehab course discussed.  All questions answered.  Plan to do this sometime is close to July 7 is possible to maximize her time for recovery prior to the beginning of school  Anderson Malta, MD 11/08/2020, 10:36 AM

## 2020-11-09 ENCOUNTER — Encounter (HOSPITAL_COMMUNITY): Payer: Self-pay | Admitting: Orthopedic Surgery

## 2020-11-09 ENCOUNTER — Telehealth: Payer: Self-pay | Admitting: Orthopedic Surgery

## 2020-11-09 ENCOUNTER — Encounter: Payer: Self-pay | Admitting: Orthopedic Surgery

## 2020-11-09 NOTE — Telephone Encounter (Signed)
[facility] calling on behalf of the pt verifying prescription orders. Pharmacist stated that it was sent in by a PA and in order for them to fill it they also need the information of the supervising provider for the pt attached. The best call back number for the pharmacy is [phone].

## 2020-11-09 NOTE — Telephone Encounter (Signed)
[facility] pharmacy. Provided information. They are filling rx for patient. I have made patient aware this has been done.

## 2020-11-10 ENCOUNTER — Other Ambulatory Visit: Payer: Self-pay | Admitting: Family Medicine

## 2020-11-10 ENCOUNTER — Other Ambulatory Visit: Payer: Self-pay | Admitting: Surgical

## 2020-11-10 ENCOUNTER — Encounter: Payer: Self-pay | Admitting: Family Medicine

## 2020-11-10 ENCOUNTER — Other Ambulatory Visit: Payer: Self-pay

## 2020-11-10 ENCOUNTER — Telehealth (INDEPENDENT_AMBULATORY_CARE_PROVIDER_SITE_OTHER): Payer: BC Managed Care – PPO | Admitting: Family Medicine

## 2020-11-10 DIAGNOSIS — R059 Cough, unspecified: Secondary | ICD-10-CM

## 2020-11-10 DIAGNOSIS — M797 Fibromyalgia: Secondary | ICD-10-CM

## 2020-11-10 MED ORDER — OXYCODONE HCL 5 MG PO TABA: 5.0000 mg | ORAL_TABLET | ORAL | 0 refills | Status: DC | PRN

## 2020-11-10 MED ORDER — BENZONATATE 200 MG PO CAPS: 200.0000 mg | ORAL_CAPSULE | Freq: Three times a day (TID) | ORAL | 1 refills | Status: DC | PRN

## 2020-11-10 MED ORDER — DOXYCYCLINE HYCLATE 100 MG PO TABS: 100.0000 mg | ORAL_TABLET | Freq: Two times a day (BID) | ORAL | 0 refills | Status: DC

## 2020-11-10 MED ORDER — FLUCONAZOLE 150 MG PO TABS: 150.0000 mg | ORAL_TABLET | Freq: Once | ORAL | 0 refills | Status: AC

## 2020-11-10 MED ORDER — ACETAMINOPHEN 325 MG PO TABS: 650.0000 mg | ORAL_TABLET | ORAL | 0 refills | Status: AC | PRN

## 2020-11-10 MED ORDER — MELOXICAM 15 MG PO TABS: 15.0000 mg | ORAL_TABLET | Freq: Every day | ORAL | 0 refills | Status: DC

## 2020-11-10 NOTE — Progress Notes (Signed)
Virtual visit completed through WebEx or similar program Patient location: home  Provider location: [place] at [facility], office  Participants: Patient and me (unless stated otherwise below)  Pandemic considerations d/w pt.   Limitations and rationale for visit method d/w patient.  Patient agreed to proceed.   CC: cough  HPI:  She is out of meloxicam.  She had R shoulder surgery 2 days ago.  Her shoulder is sore.  She had a nerve block to help with pain and that may not have worn off yet.  No fevers.  Cough started about 4 days ago.  Yellow sputum.  Cough predates surgery.  No known aspiration.  Per report, surgery was uneventful.  She had covid in April.  Minimal to no sx at the time.  She has prev covid vaccine.  Not SOB unless in coughing fit.  She notes chest congestion with deep breath.  Still speaking in complete sentences.  No rash, no ear pain.  Minimal rhinorrhea prior to surgery but not since. No loss of taste or smell.    Meds and allergies reviewed.   ROS: Per HPI unless specifically indicated in ROS section   NAD Speech wnl  A/P: Cough.  With recent surgery noted.  Given the overall constellations of symptoms we will presume she had bronchitis and start doxycycline with routine cautions given to patient regarding the medication and her situation.  She has a history of vaginitis associated with antibiotics and I sent a prescription for Diflucan. Rx sent for tessalon and meloxicam.  Routine cautions given to patient.  I asked her to practice taking deep breaths.   Supportive otherwise.  She agrees with plan.  She will update [place] as needed.

## 2020-11-13 DIAGNOSIS — R059 Cough, unspecified: Secondary | ICD-10-CM | POA: Insufficient documentation

## 2020-11-13 NOTE — Assessment & Plan Note (Signed)
Cough.  With recent surgery noted.  Given the overall constellations of symptoms we will presume she had bronchitis and start doxycycline with routine cautions given to patient regarding the medication and her situation.  She has a history of vaginitis associated with antibiotics and I sent a prescription for Diflucan. Rx sent for tessalon and meloxicam.  Routine cautions given to patient.  I asked her to practice taking deep breaths.   Supportive otherwise.  She agrees with plan.  She will update [place] as needed.

## 2020-11-16 ENCOUNTER — Ambulatory Visit (INDEPENDENT_AMBULATORY_CARE_PROVIDER_SITE_OTHER): Payer: BC Managed Care – PPO | Admitting: Orthopedic Surgery

## 2020-11-16 ENCOUNTER — Other Ambulatory Visit: Payer: Self-pay

## 2020-11-16 ENCOUNTER — Encounter: Payer: Self-pay | Admitting: Orthopedic Surgery

## 2020-11-16 DIAGNOSIS — M19011 Primary osteoarthritis, right shoulder: Secondary | ICD-10-CM

## 2020-11-16 NOTE — Progress Notes (Signed)
Post-Op Visit Note   Patient: Gwendolyn [name]           Date of Birth: September 30, 1970           MRN: [id number] Visit Date: 11/16/2020 PCP: [name], MD   Assessment & Plan:  Chief Complaint:  Chief Complaint  Patient presents with   Right Shoulder - Routine Post Op   Visit Diagnoses:  1. Arthritis of right acromioclavicular joint     Plan: Gwendolyn [name] is a 50 year old patient with right shoulder arthroscopy with distal clavicle excision 11/08/2020.  She has been doing well.  Has good range of motion.  Still having a little bit of numbness on the right side of her tongue.  Portal incisions intact.  She has been out of sling.  At this time I want her to work on stretching only.  No lifting.  We will give her a clean [place] to pursue more vigorous activities in 4 weeks.  I do not think she needs therapy because her passive range of motion is excellent with current range of motion at 50/100/165.  Follow-Up Instructions: Return in about 4 weeks (around 12/14/2020).   Orders:  No orders of the defined types were placed in this encounter.  No orders of the defined types were placed in this encounter.   Imaging: No results found.  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   Hematuria 12/05/2017   Rectal bleeding 09/02/2017   Encounter for preventive health examination 11/22/2016   History of abdominal supracervical subtotal hysterectomy 11/22/2016   Plantar fasciitis 11/10/2015   Vaginitis 03/18/2015    Menopause 01/18/2015   Hemorrhoids 10/06/2014   Hypercholesteremia 10/05/2014   Vitamin D deficiency 10/05/2014   Depressive disorder 10/05/2014   History of breast cancer 10/05/2014   Migraine without aura with status migrainosus 10/05/2014   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   Insomnia 10/05/2014   Bipolar 2 disorder ([place]) 10/05/2014   IBS (irritable bowel syndrome) 10/05/2014   Obstructive sleep apnea 10/05/2014   S/P colectomy 10/05/2014   Hypersomnia 10/05/2014   Anxiety 10/05/2014   Fibromyalgia 10/05/2014   Periodic limb movement disorder 10/05/2014   Past Medical History:  Diagnosis Date   Anal fissure    Anxiety    Arthritis    Breast cancer ([place]) 2002   lumpectomy-Right   Depression    Family history of brain cancer    Family history of colon cancer    Fibromyalgia    Gallstones    GERD (gastroesophageal reflux disease)    Irritable bowel syndrome    Motion sickness    cars, boats   Personal history of radiation therapy    Yeast vaginitis     Family History  Problem Relation Age of Onset   Meniere's disease Father    Alcohol abuse Sister    Heart disease Maternal Grandmother    Heart disease Maternal Grandfather    Brain cancer Maternal Grandfather    Heart disease Paternal Grandmother  Heart disease Paternal Grandfather    Rectal cancer Other        MGF's mother   Colon cancer Neg Hx    Esophageal cancer Neg Hx    Breast cancer Neg Hx     Past Surgical History:  Procedure Laterality Date   ABDOMINAL HYSTERECTOMY     BREAST BIOPSY Right 2002   Breast cancer DCIS   BREAST BIOPSY     BREAST LUMPECTOMY Right 2003   Radiatiomn Therapy DCIS   CHOLECYSTECTOMY  2000   COLECTOMY  10/31/2012   SECONDARY TO COLONIC INERTIA   ESOPHAGOGASTRODUODENOSCOPY (EGD) WITH PROPOFOL N/A 05/11/2020   Procedure: ESOPHAGOGASTRODUODENOSCOPY (EGD) WITH PROPOFOL;  Surgeon: Virgel Manifold, MD;  Location: [facility] ENDOSCOPY;  Service:  Endoscopy;  Laterality: N/A;   REDUCTION MAMMAPLASTY Bilateral 11/13/2018   right breast 60m DCIS found during breast reduction   SHOULDER ARTHROSCOPY WITH DISTAL CLAVICLE RESECTION Right 11/08/2020   Procedure: RIGHT SHOULDER ARTHROSCOPY, ARTHROSCOPIC DISTAL CLAVICLE EXCISION;  Surgeon: DMeredith Pel MD;  [facility]  Service: Orthopedics;  Laterality: Right;   SUPRACERVICAL ABDOMINAL HYSTERECTOMY  04/10/2011   But still has ovaries.   Social History   Occupational History   Occupation: TPharmacist, hospital Tobacco Use   Smoking status: Light Smoker    Packs/day: 0.50    Years: 20.00    Pack years: 10.00    Types: Cigarettes   Smokeless tobacco: Never   Tobacco comments:    smoked 10 yrs, quit 20 yrs, smoked last 4 yrs  Vaping Use   Vaping Use: Never used  Substance and Sexual Activity   Alcohol use: Yes    Alcohol/week: 0.0 standard drinks    Comment: OCCASIONALLY   Drug use: No   Sexual activity: Yes    Birth control/protection: Surgical

## 2020-11-18 DIAGNOSIS — M19011 Primary osteoarthritis, right shoulder: Secondary | ICD-10-CM

## 2020-11-30 ENCOUNTER — Other Ambulatory Visit: Payer: Self-pay

## 2020-11-30 ENCOUNTER — Ambulatory Visit: Payer: Self-pay

## 2020-11-30 ENCOUNTER — Ambulatory Visit (INDEPENDENT_AMBULATORY_CARE_PROVIDER_SITE_OTHER): Payer: BC Managed Care – PPO

## 2020-11-30 ENCOUNTER — Ambulatory Visit: Payer: BC Managed Care – PPO | Admitting: Family Medicine

## 2020-11-30 ENCOUNTER — Encounter: Payer: Self-pay | Admitting: Family Medicine

## 2020-11-30 VITALS — BP 120/72 | HR 99 | Ht 67.0 in | Wt 182.2 lb

## 2020-11-30 DIAGNOSIS — M25512 Pain in left shoulder: Secondary | ICD-10-CM | POA: Diagnosis not present

## 2020-11-30 DIAGNOSIS — G8929 Other chronic pain: Secondary | ICD-10-CM

## 2020-11-30 NOTE — Progress Notes (Signed)
I, Gwendolyn [name], LAT, ATC, am serving as scribe for Dr. [name].  Gwendolyn [name] is a 50 y.o. female who presents to [place] at [facility] today for L shoulder pain.  She was last seen by Dr. Georgina Snell on 09/26/20 for f/u of R shoulder pain and ultimately had a R distal clavicle excision w/ Dr. Marlou Sa on 11/08/20.  Today, pt reports L shoulder pain x 6-8 months that is progressively worsening after her R shoulder .  She locates her pain to her L ACJ/superior shoulder.  She thinks her left shoulder pain is very similar to her right and because she had great results with the right shoulder surgery is eager to proceed to similar treatment for her left.  Radiating pain: yes into the L upper arm L shoulder mechanical symptoms: no Aggravating factors: pressure on the top of her shoulder; heavy pressing w/ the L arm Treatments tried: Voltaren gel; Biofreeze; Meloxicam     Pertinent review of systems: No fevers or chills  Relevant historical information: DCIS history.   Exam:  BP 120/72 (BP Location: Right Arm, Patient Position: Sitting, Cuff Size: Normal)   Pulse 99   Ht '5\' 7"'$  (1.702 m)   Wt 182 lb 3.2 oz (82.6 kg)   SpO2 97%   BMI 28.54 kg/m  General: Well Developed, well nourished, and in no acute distress.   MSK: Left shoulder tender palpation AC joint normal shoulder motion.    Lab and Radiology Results  X-ray images left shoulder obtained today personally and independently interpreted. Mild AC DJD.  No glenohumeral DJD.  No acute fractures. Await formal radiology review.     Assessment and Plan: 50 y.o. female with left shoulder pain thought to be due to [facility] DJD with possible subacromial bursitis.  She has had Results with contralateral right shoulder distal clavicle excision and subacromial decompression.  She is had similar pain in her left shoulder this entire time and has been proceeding with home exercise program as directed by myself for the  last 3 months.  Plan for x-ray and MRI to further determine cause of pain and to potentially plan for surgical options.  Certainly could proceed with injection if needed.   PDMP not reviewed this encounter. Orders Placed This Encounter  Procedures   DG Shoulder Left    Standing Status:   Future    Number of Occurrences:   1    Standing Expiration Date:   12/31/2020    Order Specific Question:   Reason for Exam (SYMPTOM  OR DIAGNOSIS REQUIRED)    Answer:   L shoulder pain    Order Specific Question:   Is patient pregnant?    Answer:   No    Order Specific Question:   Preferred imaging location?    Answer:   [facility]   MR SHOULDER LEFT WO CONTRAST    Standing Status:   Future    Standing Expiration Date:   11/30/2021    Order Specific Question:   What is the patient's sedation requirement?    Answer:   No Sedation    Order Specific Question:   Does the patient have a pacemaker or implanted devices?    Answer:   No    Order Specific Question:   Preferred imaging location?    Answer:   [facility] (table limit-350lbs)   No orders of the defined types were placed in this encounter.    Discussed warning signs or symptoms. Please  see discharge instructions. Patient expresses understanding.   The above documentation has been reviewed and is accurate and complete [name], M.D.

## 2020-11-30 NOTE — Patient Instructions (Signed)
Thank you for coming in today.   Please get an Xray today before you leave   You should hear from MRI scheduling within 1 week. If you do not hear please let me know.    I can do an injection at any time.   Anticipate referral back to surgery for this because you had such a quick recovery.

## 2020-12-01 ENCOUNTER — Ambulatory Visit (INDEPENDENT_AMBULATORY_CARE_PROVIDER_SITE_OTHER): Payer: Self-pay | Admitting: Family Medicine

## 2020-12-01 ENCOUNTER — Encounter: Payer: Self-pay | Admitting: Family Medicine

## 2020-12-01 VITALS — BP 100/70 | HR 90 | Temp 97.9°F | Wt 180.5 lb

## 2020-12-01 DIAGNOSIS — H6981 Other specified disorders of Eustachian tube, right ear: Secondary | ICD-10-CM

## 2020-12-01 NOTE — Progress Notes (Signed)
Subjective:     Gwendolyn [name] is a 50 y.o. female presenting for Ear Pain (R x 1 week )     Otalgia  There is pain in the right ear. This is a new problem. The current episode started in the past 7 days. The problem occurs every few hours. The problem has been gradually worsening. There has been no fever. Associated symptoms include neck pain (chronic). Pertinent negatives include no coughing, ear discharge, headaches, hearing loss, rhinorrhea or sore throat. Treatments tried: peroxide. The treatment provided moderate relief.   No sick contact   Hx of fungal infections in the ear - 10 years ago, had several antibiotics and things got worse  Review of Systems  HENT:  Positive for ear pain and tinnitus. Negative for ear discharge, hearing loss, rhinorrhea and sore throat.   Respiratory:  Negative for cough.   Musculoskeletal:  Positive for neck pain (chronic).  Neurological:  Negative for headaches.    Social History   Tobacco Use  Smoking Status Light Smoker   Packs/day: 0.50   Years: 20.00   Pack years: 10.00   Types: Cigarettes  Smokeless Tobacco Never  Tobacco Comments   smoked 10 yrs, quit 20 yrs, smoked last 4 yrs        Objective:    BP Readings from Last 3 Encounters:  12/01/20 100/70  11/30/20 120/72  11/08/20 (!) 102/53   Wt Readings from Last 3 Encounters:  12/01/20 180 lb 8 oz (81.9 kg)  11/30/20 182 lb 3.2 oz (82.6 kg)  11/10/20 175 lb (79.4 kg)    BP 100/70   Pulse 90   Temp 97.9 F (36.6 C) (Temporal)   Wt 180 lb 8 oz (81.9 kg)   SpO2 97%   BMI 28.27 kg/m    Physical Exam Constitutional:      General: She is not in acute distress.    Appearance: She is well-developed. She is not diaphoretic.  HENT:     Head: Normocephalic and atraumatic.     Right Ear: External ear normal. A middle ear effusion is present. Tympanic membrane is not erythematous, retracted or bulging.     Left Ear: Tympanic membrane, ear canal and external ear  normal.     Nose: Nose normal. No congestion or rhinorrhea.  Eyes:     Conjunctiva/sclera: Conjunctivae normal.  Cardiovascular:     Rate and Rhythm: Normal rate and regular rhythm.     Heart sounds: No murmur heard. Pulmonary:     Effort: Pulmonary effort is normal. No respiratory distress.     Breath sounds: Normal breath sounds. No wheezing.  Musculoskeletal:     Cervical back: Neck supple.  Skin:    General: Skin is warm and dry.     Capillary Refill: Capillary refill takes less than 2 seconds.  Neurological:     Mental Status: She is alert. Mental status is at baseline.  Psychiatric:        Mood and Affect: Mood normal.        Behavior: Behavior normal.          Assessment & Plan:   Problem List Items Addressed This Visit   None Visit Diagnoses     Eustachian tube dysfunction, right    -  Primary      Discussed no sign of external/internal ear infection. Some fluid so suspect pain may be 2/2 to eustachian tube dysfunction - treat with flonase and sudafed  If drainage, given hx could trial  drops with ENT f/u if no improved  Return if symptoms worsen or fail to improve.  Lesleigh Noe, MD  This visit occurred during the SARS-CoV-2 public health emergency.  Safety protocols were in place, including screening questions prior to the visit, additional usage of staff PPE, and extensive cleaning of exam room while observing appropriate contact time as indicated for disinfecting solutions.

## 2020-12-01 NOTE — Patient Instructions (Addendum)
#   Eustachian Tube Dysfunction - try sudafed or flonase  - if no improvement or drainage call back or consider ENT

## 2020-12-02 NOTE — Progress Notes (Signed)
Left shoulder x-ray looks a lot better than the right.  Not much AC arthritis present.

## 2020-12-12 ENCOUNTER — Other Ambulatory Visit: Payer: Self-pay

## 2020-12-12 ENCOUNTER — Ambulatory Visit (INDEPENDENT_AMBULATORY_CARE_PROVIDER_SITE_OTHER): Payer: BC Managed Care – PPO | Admitting: Orthopedic Surgery

## 2020-12-12 DIAGNOSIS — M19011 Primary osteoarthritis, right shoulder: Secondary | ICD-10-CM

## 2020-12-14 MED ORDER — VARENICLINE TARTRATE 1 MG PO TABS: 1.0000 mg | ORAL_TABLET | Freq: Two times a day (BID) | ORAL | 2 refills | Status: DC

## 2020-12-14 MED ORDER — VARENICLINE TARTRATE 0.5 MG X 11 & 1 MG X 42 PO MISC: ORAL | 0 refills | Status: DC

## 2020-12-16 ENCOUNTER — Encounter: Payer: Self-pay | Admitting: Orthopedic Surgery

## 2020-12-16 NOTE — Progress Notes (Signed)
Post-Op Visit Note   Patient: Gwendolyn [name]           Date of Birth: [birth year]           MRN: [id number] Visit Date: 12/12/2020 PCP: [name], MD   Assessment & Plan:  Chief Complaint:  Chief Complaint  Patient presents with   Right Shoulder - Routine Post Op    11/08/20 Right shoulder scope with DCE   Visit Diagnoses:  1. Arthritis of right acromioclavicular joint     Plan: Patient presents now 5 weeks out right shoulder arthroscopy with distal clavicle excision arthroscopically.  She is doing well with no problems.  On exam she has excellent range of motion and strength in the right shoulder with no tenderness localizing to the [facility] joint.  Plan is okay to lift 15 pounds which will help her at work.  4 weeks before she resumes her outside weightlifting routine.  She has MRI pending on the left shoulder for similar issues.  She is done surprisingly well quickly with her surgery.  Does not really need any physical therapy at this time based on her objective findings  Follow-Up Instructions: Return if symptoms worsen or fail to improve.   Orders:  No orders of the defined types were placed in this encounter.  No orders of the defined types were placed in this encounter.   Imaging: No results found.  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   Hematuria 12/05/2017   Rectal bleeding 09/02/2017   Encounter for preventive health examination 11/22/2016    History of abdominal supracervical subtotal hysterectomy 11/22/2016   Plantar fasciitis 11/10/2015   Vaginitis 03/18/2015   Menopause 01/18/2015   Hemorrhoids 10/06/2014   Hypercholesteremia 10/05/2014   Vitamin D deficiency 10/05/2014   Depressive disorder 10/05/2014   History of breast cancer 10/05/2014   Migraine without aura with status migrainosus 10/05/2014   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   Insomnia 10/05/2014   Bipolar 2 disorder ([place]) 10/05/2014   IBS (irritable bowel syndrome) 10/05/2014   Obstructive sleep apnea 10/05/2014   S/P colectomy 10/05/2014   Hypersomnia 10/05/2014   Anxiety 10/05/2014   Fibromyalgia 10/05/2014   Periodic limb movement disorder 10/05/2014   Past Medical History:  Diagnosis Date   Anal fissure    Anxiety    Arthritis    Breast cancer ([place]) 2002   lumpectomy-Right   Depression    Family history of brain cancer    Family history of colon cancer    Fibromyalgia    Gallstones    GERD (gastroesophageal reflux disease)    Irritable bowel syndrome    Motion sickness    cars, boats   Personal history of radiation therapy    Yeast vaginitis     Family History  Problem Relation Age of Onset   Meniere's disease Father    Alcohol abuse Sister    Heart disease Maternal Grandmother  Heart disease Maternal Grandfather    Brain cancer Maternal Grandfather    Heart disease Paternal Grandmother    Heart disease Paternal Grandfather    Rectal cancer Other        MGF's mother   Colon cancer Neg Hx    Esophageal cancer Neg Hx    Breast cancer Neg Hx     Past Surgical History:  Procedure Laterality Date   ABDOMINAL HYSTERECTOMY     BREAST BIOPSY Right 2002   Breast cancer DCIS   BREAST BIOPSY     BREAST LUMPECTOMY Right 2003   Radiatiomn Therapy DCIS   CHOLECYSTECTOMY  2000   COLECTOMY  10/31/2012   SECONDARY TO COLONIC INERTIA   ESOPHAGOGASTRODUODENOSCOPY (EGD) WITH PROPOFOL N/A 05/11/2020    Procedure: ESOPHAGOGASTRODUODENOSCOPY (EGD) WITH PROPOFOL;  Surgeon: Virgel Manifold, MD;  Location: [facility] ENDOSCOPY;  Service: Endoscopy;  Laterality: N/A;   REDUCTION MAMMAPLASTY Bilateral 11/13/2018   right breast 37m DCIS found during breast reduction   SHOULDER ARTHROSCOPY WITH DISTAL CLAVICLE RESECTION Right 11/08/2020   Procedure: RIGHT SHOULDER ARTHROSCOPY, ARTHROSCOPIC DISTAL CLAVICLE EXCISION;  Surgeon: DMeredith Pel MD;  [facility]  Service: Orthopedics;  Laterality: Right;   SUPRACERVICAL ABDOMINAL HYSTERECTOMY  04/10/2011   But still has ovaries.   Social History   Occupational History   Occupation: TPharmacist, hospital Tobacco Use   Smoking status: Light Smoker    Packs/day: 0.50    Years: 20.00    Pack years: 10.00    Types: Cigarettes   Smokeless tobacco: Never   Tobacco comments:    smoked 10 yrs, quit 20 yrs, smoked last 4 yrs  Vaping Use   Vaping Use: Never used  Substance and Sexual Activity   Alcohol use: Yes    Alcohol/week: 0.0 standard drinks    Comment: OCCASIONALLY   Drug use: No   Sexual activity: Yes    Birth control/protection: Surgical

## 2021-01-01 ENCOUNTER — Encounter: Payer: Self-pay | Admitting: Family Medicine

## 2021-01-01 DIAGNOSIS — M25512 Pain in left shoulder: Secondary | ICD-10-CM

## 2021-01-01 DIAGNOSIS — G8929 Other chronic pain: Secondary | ICD-10-CM

## 2021-01-06 ENCOUNTER — Encounter: Payer: Self-pay | Admitting: Internal Medicine

## 2021-01-06 ENCOUNTER — Other Ambulatory Visit (HOSPITAL_COMMUNITY)
Admission: RE | Admit: 2021-01-06 | Discharge: 2021-01-06 | Disposition: A | Discharge status: Home / Self Care | Payer: BC Managed Care – PPO | Source: Ambulatory Visit | Attending: Internal Medicine | Admitting: Internal Medicine

## 2021-01-06 ENCOUNTER — Other Ambulatory Visit: Payer: Self-pay

## 2021-01-06 ENCOUNTER — Ambulatory Visit (INDEPENDENT_AMBULATORY_CARE_PROVIDER_SITE_OTHER): Payer: BC Managed Care – PPO | Admitting: Internal Medicine

## 2021-01-06 VITALS — BP 98/66 | HR 73 | Temp 96.6°F | Ht 67.0 in | Wt 179.0 lb

## 2021-01-06 DIAGNOSIS — Z124 Encounter for screening for malignant neoplasm of cervix: Secondary | ICD-10-CM | POA: Diagnosis present

## 2021-01-06 DIAGNOSIS — R5383 Other fatigue: Secondary | ICD-10-CM

## 2021-01-06 DIAGNOSIS — F3181 Bipolar II disorder: Secondary | ICD-10-CM

## 2021-01-06 DIAGNOSIS — N760 Acute vaginitis: Secondary | ICD-10-CM | POA: Diagnosis not present

## 2021-01-06 DIAGNOSIS — Z0000 Encounter for general adult medical examination without abnormal findings: Secondary | ICD-10-CM | POA: Diagnosis not present

## 2021-01-06 DIAGNOSIS — D0511 Intraductal carcinoma in situ of right breast: Secondary | ICD-10-CM

## 2021-01-06 DIAGNOSIS — Z86000 Personal history of in-situ neoplasm of breast: Secondary | ICD-10-CM

## 2021-01-06 DIAGNOSIS — E7800 Pure hypercholesterolemia, unspecified: Secondary | ICD-10-CM

## 2021-01-06 DIAGNOSIS — Z853 Personal history of malignant neoplasm of breast: Secondary | ICD-10-CM

## 2021-01-06 MED ORDER — ZOSTER VAC RECOMB ADJUVANTED 50 MCG/0.5ML IM SUSR: 0.5000 mL | Freq: Once | INTRAMUSCULAR | 1 refills | Status: AC

## 2021-01-06 NOTE — Progress Notes (Signed)
Patient ID: Gwendolyn [name], female    DOB: May 16, 1970  Age: 50 y.o. MRN: [id number]  The patient is here for annual preventive examination and management of other chronic and acute problems.  This visit occurred during the SARS-CoV-2 public health emergency.  Safety protocols were in place, including screening questions prior to the visit, additional usage of staff PPE, and extensive cleaning of exam room while observing appropriate contact time as indicated for disinfecting solutions.     The risk factors are reflected in the social history. Has resumed taking Chantix in an effort to quit smoking  She continues to engage in extra marital sex on a regular basis.   The roster of all physicians providing medical care to patient - is listed in the Snapshot section of the chart.  Activities of daily living:  The patient is 100% independent in all ADLs: dressing, toileting, feeding as well as independent mobility  Home safety : The patient has smoke detectors in the home. They wear seatbelts.  There are no firearms at home. There is no violence in the home.   There is no risks for hepatitis, STDs or HIV. There is no   history of blood transfusion. They have no travel history to infectious disease endemic areas of the world.  The patient has seen their dentist in the last six month. They have seen their eye doctor in the last year. She denies haring difficulty with regard to whispered voices and some television programs.  They have deferred audiologic testing in the last year.  They do not  have excessive sun exposure. Discussed the need for sun protection: hats, long sleeves and use of sunscreen if there is significant sun exposure.   Diet: the importance of a healthy diet is discussed. They do have a healthy diet.  The benefits of regular aerobic exercise were discussed. She exercises  4 days per week , 60 minutes .   Depression screen: there are no signs or vegative symptoms of  depression- irritability, change in appetite, anhedonia, sadness/tearfullness.  The following portions of the patient's history were reviewed and updated as appropriate: allergies, current medications, past family history, past medical history,  past surgical history, past social history  and problem list.  Visual acuity was not assessed per patient preference since she has regular follow up with her ophthalmologist. Hearing and body mass index were assessed and reviewed.   During the course of the visit the patient was educated and counseled about appropriate screening and preventive services including : fall prevention , diabetes screening, nutrition counseling, colorectal cancer screening, and recommended immunizations.    CC: Diagnoses of History of ductal carcinoma in situ (DCIS) of breast, Cervical cancer screening, Hypercholesteremia, Fatigue, unspecified type, Acute vaginitis, Ductal carcinoma in situ (DCIS) of right breast, Encounter for preventive health examination, Bipolar 2 disorder ([place]), and History of breast cancer were pertinent to this visit.   1) reviewed  sports medicine visits over the last several months for chronic left shoulder pain.  MRI scheduled by Sherene Sires MD  2) Overweight:  has  lost 17 lbs since 2019 KEPT off using Green smoothie diet. Undergoing removal of unwanted fat using Cool sculpting,  first session was yesterday.   3) history of right DCIS. Found during breast reduction surgery in July 2020  and BRCA right breast in 2002 . Has not had mammogram or MRI since 2020. Overdue .  [facility] has not followed up with her per patient.    [name]  has a past medical history of Anal fissure, Anxiety, Arthritis, Breast cancer ([place]) (2002), Depression, Family history of brain cancer, Family history of colon cancer, Fibromyalgia, Gallstones, GERD (gastroesophageal reflux disease), Irritable bowel syndrome, Motion sickness, Personal history of radiation therapy,  and Yeast vaginitis.   She has a past surgical history that includes Cholecystectomy (2000); Supracervical abdominal hysterectomy (04/10/2011); Colectomy (10/31/2012); Abdominal hysterectomy; Reduction mammaplasty (Bilateral, 11/13/2018); Breast biopsy (Right, 2002); Breast biopsy; Breast lumpectomy (Right, 2003); Esophagogastroduodenoscopy (egd) with propofol (N/A, 05/11/2020); and Shoulder arthroscopy with distal clavicle resection (Right, 11/08/2020).   Her family history includes Alcohol abuse in her sister; Brain cancer in her maternal grandfather; Heart disease in her maternal grandfather, maternal grandmother, paternal grandfather, and paternal grandmother; Meniere's disease in her father; Rectal cancer in an other family member.She reports that she has been smoking cigarettes. She has a 10.00 pack-year smoking history. She has never used smokeless tobacco. She reports current alcohol use. She reports that she does not use drugs.  Outpatient Medications Prior to Visit  Medication Sig Dispense Refill   acetaminophen (TYLENOL) 325 MG tablet Take 2 tablets (650 mg total) by mouth every 4 (four) hours as needed. 40 tablet 0   amphetamine-dextroamphetamine (ADDERALL) 30 MG tablet Take 30 mg by mouth 2 (two) times daily.   0   APPLE CIDER VINEGAR PO Take 1,600 mg by mouth daily.     asenapine (SAPHRIS) 5 MG SUBL 24 hr tablet 5 mg at bedtime.     ASHWAGANDHA PO Take 3,000 mg by mouth at bedtime.     BLACK ELDERBERRY PO Take 2,000 mg by mouth daily.     Cholecalciferol (VITAMIN D) 50 MCG (2000 UT) tablet Take 2,000 Units by mouth daily.     clotrimazole-betamethasone (LOTRISONE) cream Apply 1 application topically 2 (two) times daily. 30 g 0   CRANBERRY PO Take 30,000 mg by mouth at bedtime.     Cyanocobalamin (VITAMIN B-12) 5000 MCG TBDP Take 5,000 mcg by mouth daily.     DULoxetine HCl 40 MG CPEP Take 80 mg by mouth every morning.     Ferrous Sulfate (IRON PO) Take 25 mg by mouth daily.     FIBER  PO Take 6 tablets by mouth in the morning and at bedtime.     gabapentin (NEURONTIN) 800 MG tablet TAKE 1 TABLET (800 MG TOTAL) BY MOUTH 3 (THREE) TIMES DAILY AS NEEDED (NERVE PAIN). 180 tablet 1   GINKGO BILOBA PO Take 3,000 mg by mouth in the morning and at bedtime.     Melatonin 10 MG TABS Take 10 mg by mouth daily.     meloxicam (MOBIC) 15 MG tablet Take 1 tablet (15 mg total) by mouth daily. With food.  Don't take with aleve or ibuprofen. 30 tablet 0   metFORMIN (GLUCOPHAGE-XR) 500 MG 24 hr tablet TAKE 1 TABLET BY MOUTH EVERY DAY WITH BREAKFAST 90 tablet 1   metroNIDAZOLE (METROGEL VAGINAL) 0.75 % vaginal gel Place 1 Applicatorful vaginally 2 (two) times daily. 70 g 0   omeprazole (PRILOSEC) 20 MG capsule TAKE 1 CAPSULE BY MOUTH EVERY DAY 30 capsule 0   ondansetron (ZOFRAN ODT) 4 MG disintegrating tablet Take 1 tablet (4 mg total) by mouth every 8 (eight) hours as needed for nausea or vomiting. 20 tablet 0   Probiotic Product (PROBIOTIC PO) Take 1 capsule by mouth daily.     tiaGABine (GABITRIL) 4 MG tablet Take 4-16 mg by mouth See admin instructions. Take 4 mg in the morning and  16 mg at bedtime     [START ON 01/14/2021] varenicline (CHANTIX) 1 MG tablet Take 1 tablet (1 mg total) by mouth 2 (two) times daily. 60 tablet 2   benzonatate (TESSALON) 200 MG capsule Take 1 capsule (200 mg total) by mouth 3 (three) times daily as needed. (Patient not taking: Reported on 01/06/2021) 30 capsule 1   dicyclomine (BENTYL) 10 MG capsule TAKE 1 CAPSULE (10 MG TOTAL) BY MOUTH 4 (FOUR) TIMES DAILY - BEFORE MEALS AND AT BEDTIME. (Patient not taking: Reported on 01/06/2021) 360 capsule 1   doxycycline (VIBRA-TABS) 100 MG tablet Take 1 tablet (100 mg total) by mouth 2 (two) times daily. (Patient not taking: Reported on 01/06/2021) 14 tablet 0   methocarbamol (ROBAXIN) 500 MG tablet Take 1 tablet (500 mg total) by mouth every 8 (eight) hours as needed. (Patient not taking: Reported on 01/06/2021) 30 tablet 0    OxyCODONE HCl, Abuse Deter, (OXAYDO) 5 MG TABA Take 5 mg by mouth every 4 (four) hours as needed. (Patient not taking: Reported on 01/06/2021) 30 tablet 0   QUEtiapine (SEROQUEL) 50 MG tablet Take 100 mg by mouth at bedtime. (Patient not taking: Reported on 01/06/2021)     varenicline (CHANTIX PAK) 0.5 MG X 11 & 1 MG X 42 tablet Take one 0.5 mg tablet by mouth once daily for 3 days, then increase to one 0.5 mg tablet twice daily for 4 days, then increase to one 1 mg tablet twice daily. (Patient not taking: Reported on 01/06/2021) 53 tablet 0   No facility-administered medications prior to visit.    Review of Systems  Patient denies headache, fevers, malaise, unintentional weight loss, skin rash, eye pain, sinus congestion and sinus pain, sore throat, dysphagia,  hemoptysis , cough, dyspnea, wheezing, chest pain, palpitations, orthopnea, edema, abdominal pain, nausea, melena, diarrhea, constipation, flank pain, dysuria, hematuria, urinary  Frequency, nocturia, numbness, tingling, seizures,  Focal weakness, Loss of consciousness,  Tremor, insomnia, depression, anxiety, and suicidal ideation.     Objective:  BP 98/66 (BP Location: Left Arm, Patient Position: Sitting, Cuff Size: Normal)   Pulse 73   Temp (!) 96.6 F (35.9 C) (Temporal)   Ht $R'5\' 7"'Ca$  (1.702 m)   Wt 179 lb (81.2 kg)   SpO2 97%   BMI 28.04 kg/m   Physical Exam   General Appearance:    Alert, cooperative, no distress, appears stated age  Head:    Normocephalic, without obvious abnormality, atraumatic  Eyes:    PERRL, conjunctiva/corneas clear, EOM's intact, fundi    benign, both eyes  Ears:    Normal TM's and external ear canals, both ears  Nose:   Nares normal, septum midline, mucosa normal, no drainage    or sinus tenderness  Throat:   Lips, mucosa, and tongue normal; teeth and gums normal  Neck:   Supple, symmetrical, trachea midline, no adenopathy;    thyroid:  no enlargement/tenderness/nodules; no carotid   bruit or JVD   Back:     Symmetric, no curvature, ROM normal, no CVA tenderness  Lungs:     Clear to auscultation bilaterally, respirations unlabored  Chest Wall:    No tenderness or deformity   Heart:    Regular rate and rhythm, S1 and S2 normal, no murmur, rub   or gallop  Breast Exam:    No tenderness, masses, or nipple abnormality  Abdomen:     Soft, non-tender, bowel sounds active all four quadrants,    no masses, no organomegaly  Genitalia:  Pelvic: cervix  normal in appearance, external genitalia normal, no adnexal masses or tenderness, no cervical motion tenderness, rectovaginal septum normal, uterus surgically absent ,  vagina normal increased whitish discharge  Extremities:   Extremities normal, atraumatic, no cyanosis or edema  Pulses:   2+ and symmetric all extremities  Skin:   Skin color, texture, turgor normal, no rashes or lesions  Lymph nodes:   Cervical, supraclavicular, and axillary nodes normal  Neurologic:   CNII-XII intact, normal strength, sensation and reflexes    throughout    Assessment & Plan:   Problem List Items Addressed This Visit       Unprioritized   Hypercholesteremia   Relevant Orders   Lipid panel   History of breast cancer    Per review of Dr Ernestina Penna Notes from Feb 2022 visit:  Patient was not interested in chemoprevention with antiestrogen therapy, with main concern of low libido. He advised her to Continue cancer breast cancer screening with annual mammogram and MRI.  She is overdue for mammogram, I ordered last mammogram for her to be done at [facility] late this month and screening breast MRI w wo contrast in [place] in 10/2020       Bipolar 2 disorder ([place])    With cyclothymia and anxiety.  All psychiatric diagnoses are managed by Johna Roles.  Symptoms are stable      Vaginitis    STD screening in progress      Relevant Orders   Cervicovaginal ancillary only( [place])   Encounter for preventive health examination    age appropriate education  and counseling updated, referrals for preventative services and immunizations addressed, dietary and smoking counseling addressed, most recent labs reviewed.  I have personally reviewed and have noted:   1) the patient's medical and social history 2) The pt's use of alcohol, tobacco, and illicit drugs 3) The patient's current medications and supplements 4) Functional ability including ADL's, fall risk, home safety risk, hearing and visual impairment 5) Diet and physical activities 6) Evidence for depression or mood disorder 7) The patient's height, weight, and BMI have been recorded in the chart   I have made referrals, and provided counseling and education based on review of the above      DCIS (ductal carcinoma in situ)    Has not had oncology follow up or surveillance imaging  Since 2020.  Mammogram ordering.       Other Visit Diagnoses     History of ductal carcinoma in situ (DCIS) of breast       Relevant Orders   MM Digital Diagnostic Bilat   Cervical cancer screening       Relevant Orders   Cytology - PAP   Fatigue, unspecified type       Relevant Orders   Comprehensive metabolic panel   CBC with Differential/Platelet   TSH       I have discontinued Tracy Scorza's dicyclomine, QUEtiapine, methocarbamol, OxyCODONE HCl (Abuse Deter), doxycycline, and benzonatate. I am also having her start on Zoster Vaccine Adjuvanted. Additionally, I am having her maintain her tiaGABine, GINKGO BILOBA PO, amphetamine-dextroamphetamine, DULoxetine HCl, clotrimazole-betamethasone, metroNIDAZOLE, ondansetron, ASHWAGANDHA PO, Ferrous Sulfate (IRON PO), CRANBERRY PO, APPLE CIDER VINEGAR PO, Vitamin B-12, Probiotic Product (PROBIOTIC PO), FIBER PO, omeprazole, metFORMIN, Melatonin, BLACK ELDERBERRY PO, Vitamin D, acetaminophen, meloxicam, gabapentin, varenicline, and asenapine.  Meds ordered this encounter  Medications   Zoster Vaccine Adjuvanted [facility]) injection    Sig: Inject  0.5 mLs into the muscle once for 1  dose.    Dispense:  1 each    Refill:  1    Medications Discontinued During This Encounter  Medication Reason   benzonatate (TESSALON) 200 MG capsule    dicyclomine (BENTYL) 10 MG capsule    doxycycline (VIBRA-TABS) 100 MG tablet    methocarbamol (ROBAXIN) 500 MG tablet    OxyCODONE HCl, Abuse Deter, (OXAYDO) 5 MG TABA    QUEtiapine (SEROQUEL) 50 MG tablet    varenicline (CHANTIX PAK) 0.5 MG X 11 & 1 MG X 42 tablet     Follow-up: No follow-ups on file.   [name], MD

## 2021-01-06 NOTE — Patient Instructions (Signed)
Diagnostic mammogram has been ordered  PAP smear done  Shingrix vaccine printed ; get it done at local pharmacy

## 2021-01-08 NOTE — Assessment & Plan Note (Signed)
Per review of Dr Ernestina Penna Notes from Feb 2022 visit:  Patient was not interested in chemoprevention with antiestrogen therapy, with main concern of low libido. He advised her to Continue cancer breast cancer screening with annual mammogram and MRI.  She is overdue for mammogram, I ordered last mammogram for her to be done at [facility] late this month and screening breast MRI w wo contrast in [place] in 10/2020

## 2021-01-08 NOTE — Assessment & Plan Note (Signed)

## 2021-01-08 NOTE — Assessment & Plan Note (Signed)
With cyclothymia and anxiety.  All psychiatric diagnoses are managed by Johna Roles.  Symptoms are stable

## 2021-01-08 NOTE — Assessment & Plan Note (Signed)
Has not had oncology follow up or surveillance imaging  Since 2020.  Mammogram ordering.

## 2021-01-08 NOTE — Assessment & Plan Note (Signed)
STD screening in progress

## 2021-01-09 LAB — CERVICOVAGINAL ANCILLARY ONLY
Bacterial Vaginitis (gardnerella): NEGATIVE
Candida Glabrata: NEGATIVE
Candida Vaginitis: NEGATIVE
Comment: NEGATIVE
Comment: NEGATIVE
Comment: NEGATIVE

## 2021-01-12 MED ORDER — MOMETASONE FUROATE 0.1 % EX CREA: TOPICAL_CREAM | CUTANEOUS | 1 refills | Status: DC

## 2021-01-13 LAB — CYTOLOGY - PAP
Chlamydia: NEGATIVE
Comment: NEGATIVE
Comment: NEGATIVE
Comment: NEGATIVE
Comment: NEGATIVE
Comment: NORMAL
Diagnosis: NEGATIVE
Diagnosis: REACTIVE
HSV1: NEGATIVE
HSV2: NEGATIVE
High risk HPV: NEGATIVE
Neisseria Gonorrhea: NEGATIVE
Trichomonas: NEGATIVE

## 2021-01-15 ENCOUNTER — Other Ambulatory Visit: Payer: BC Managed Care – PPO

## 2021-01-25 ENCOUNTER — Other Ambulatory Visit: Payer: BC Managed Care – PPO

## 2021-01-28 ENCOUNTER — Ambulatory Visit (INDEPENDENT_AMBULATORY_CARE_PROVIDER_SITE_OTHER): Payer: BC Managed Care – PPO

## 2021-01-28 ENCOUNTER — Other Ambulatory Visit: Payer: Self-pay

## 2021-01-28 DIAGNOSIS — G8929 Other chronic pain: Secondary | ICD-10-CM

## 2021-01-28 DIAGNOSIS — M25512 Pain in left shoulder: Secondary | ICD-10-CM

## 2021-01-31 ENCOUNTER — Telehealth: Payer: Self-pay | Admitting: Nurse Practitioner

## 2021-01-31 NOTE — Telephone Encounter (Signed)
Rescheduled per provider template change, message was left with pt 

## 2021-02-01 NOTE — Progress Notes (Signed)
Left shoulder MRI shows mild tendinitis of the rotator cuff tendons and biceps tendon inside the shoulder joint.  Additionally mild shoulder bursitis is present.  No rotator cuff tears are present.  This probably could be treated with injection if it is hurting bad enough to warrant it. If you would like an injection please return to clinic for injection otherwise I would recommend a home exercise program or possibly physical therapy exercises.

## 2021-02-04 ENCOUNTER — Other Ambulatory Visit: Payer: Self-pay | Admitting: Internal Medicine

## 2021-02-06 NOTE — Telephone Encounter (Signed)
Last Labs 9/22 last OV 01/06/21 please advise to refill Meloxicam .

## 2021-02-07 NOTE — Progress Notes (Signed)
I, [name], LAT, ATC, am serving as scribe for Dr. [name].  Gwendolyn [name] is a 50 y.o. female who presents to [place] at [facility] today for f/u of L shoulder pain and L shoulder MRI review.  She was last seen by Dr. Georgina Snell on 11/30/20 w/ worsening L shoulder pain and was referred for a L shoulder MRI as her pain was chronic and similar to her R shoulder on which she ultimately had a distal clavicle resection.  Today, pt reports L shoulder is uncomfortable and now her R shoulder is started hurting again.     She also notes her right ear is bothering her for a few months now.  She has a history of fungal otitis externa and eczema of the ear canal.  She is treating her ear with topical over-the-counter steroids and washing her ear out with rubbing alcohol weekly.  Diagnostic testing: L shoulder MRI- 01/28/21; L shoulder XR- 11/30/20; R shoulder MRI- 09/18/20; R shoulder XR- 09/08/20  Pertinent review of systems: No fevers or chills  Relevant historical information: History right shoulder surgery July 2022.  Distal clavicle excision and subacromial decompression. Dr Marlou Sa.    Exam:  BP 138/82   Pulse 80   Ht 5\' 7"  (1.702 m)   Wt 187 lb (84.8 kg)   SpO2 97%   BMI 29.29 kg/m  General: Well Developed, well nourished, and in no acute distress.   MSK: Left shoulder: Normal-appearing normal motion normal strength.  Some pain with abduction.  Mildly positive Hawkins and Neer's test  HEENT: Right ear.  External ear normal. Ear canal with minimal erythematous.  Tympanic membrane dull and yellow.    Lab and Radiology Results  EXAM: MRI OF THE LEFT SHOULDER WITHOUT CONTRAST   TECHNIQUE: Multiplanar, multisequence MR imaging of the shoulder was performed. No intravenous contrast was administered.   COMPARISON:  X-ray shoulder 11/30/2020.   FINDINGS: Rotator cuff: Mild tendinosis of the supraspinatus and subscapularis tendons without tear.  Infraspinatus and teres minor tendons are within normal limits.   Muscles: Preserved bulk and signal intensity of the rotator cuff musculature without edema, atrophy, or fatty infiltration.   Biceps long head:  Mild intra-articular biceps tendinosis.   Acromioclavicular Joint: Minimal arthropathy of the AC joint. Trace subacromial-subdeltoid bursal fluid.   Glenohumeral Joint: No cartilage defect. No significant joint effusion.   Labrum: Grossly intact, but evaluation is limited by lack of intraarticular fluid/contrast. No paralabral cyst.   Bones:  No marrow abnormality, fracture or dislocation.   Other: None.   IMPRESSION: 1. Mild tendinosis of the supraspinatus and subscapularis tendons without tear. 2. Mild intra-articular biceps tendinosis. 3. Minimal subacromial-subdeltoid bursitis.     Electronically Signed   By: Davina Poke D.O.   On: 01/31/2021 16:55 I, [name], personally (independently) visualized and performed the interpretation of the images attached in this note.      Assessment and Plan: 50 y.o. female with left shoulder pain due to subacromial pendular and rotator cuff tendinitis.  Patient should be able to improve with conservative management strategies.  Plan for home exercise program taught in clinic today by ATC.  If not improved would consider formal PT and home exercise program and possibly injection.  Reviewed MRI findings today compared to right shoulder MRI and discussed that her left shoulder is dramatically in better shape than her right shoulder was prior to surgery.  Right ear pain: Unclear etiology.  Recommend follow-up with ENT or PCP.  Suspect fungal otitis externa versus eczema in the external ear canal partially treated.      Discussed warning signs or symptoms. Please see discharge instructions. Patient expresses understanding.   The above documentation has been reviewed and is accurate and complete [name], M.D.

## 2021-02-08 ENCOUNTER — Other Ambulatory Visit: Payer: Self-pay

## 2021-02-08 ENCOUNTER — Ambulatory Visit (INDEPENDENT_AMBULATORY_CARE_PROVIDER_SITE_OTHER): Payer: BC Managed Care – PPO | Admitting: Family Medicine

## 2021-02-08 ENCOUNTER — Ambulatory Visit: Payer: BC Managed Care – PPO | Admitting: Family Medicine

## 2021-02-08 VITALS — BP 138/82 | HR 80 | Ht 67.0 in | Wt 187.0 lb

## 2021-02-08 DIAGNOSIS — H9201 Otalgia, right ear: Secondary | ICD-10-CM

## 2021-02-08 DIAGNOSIS — G8929 Other chronic pain: Secondary | ICD-10-CM | POA: Diagnosis not present

## 2021-02-08 DIAGNOSIS — M25512 Pain in left shoulder: Secondary | ICD-10-CM

## 2021-02-08 NOTE — Patient Instructions (Addendum)
Thank you for coming in today.   Please complete the exercises that the athletic trainer went over with you:  View at [email or web address] using code: [email or web address]  Recheck back as needed

## 2021-03-02 ENCOUNTER — Emergency Department: Payer: BC Managed Care – PPO

## 2021-03-02 ENCOUNTER — Emergency Department
Admission: EM | Admit: 2021-03-02 | Discharge: 2021-03-02 | Disposition: A | Discharge status: Home / Self Care | Payer: BC Managed Care – PPO | Attending: Emergency Medicine | Admitting: Emergency Medicine

## 2021-03-02 ENCOUNTER — Other Ambulatory Visit: Payer: Self-pay

## 2021-03-02 DIAGNOSIS — R0789 Other chest pain: Secondary | ICD-10-CM | POA: Diagnosis present

## 2021-03-02 DIAGNOSIS — Z853 Personal history of malignant neoplasm of breast: Secondary | ICD-10-CM | POA: Diagnosis not present

## 2021-03-02 DIAGNOSIS — F17210 Nicotine dependence, cigarettes, uncomplicated: Secondary | ICD-10-CM | POA: Insufficient documentation

## 2021-03-02 LAB — CBC
HCT: 39.3 % (ref 36.0–46.0)
Hemoglobin: 12.9 g/dL (ref 12.0–15.0)
MCH: 31.3 pg (ref 26.0–34.0)
MCHC: 32.8 g/dL (ref 30.0–36.0)
MCV: 95.4 fL (ref 80.0–100.0)
Platelets: 271 10*3/uL (ref 150–400)
RBC: 4.12 MIL/uL (ref 3.87–5.11)
RDW: 13 % (ref 11.5–15.5)
WBC: 8.1 10*3/uL (ref 4.0–10.5)
nRBC: 0 % (ref 0.0–0.2)

## 2021-03-02 LAB — BASIC METABOLIC PANEL
Anion gap: 6 (ref 5–15)
BUN: 17 mg/dL (ref 6–20)
CO2: 27 mmol/L (ref 22–32)
Calcium: 8.7 mg/dL — ABNORMAL LOW (ref 8.9–10.3)
Chloride: 102 mmol/L (ref 98–111)
Creatinine, Ser: 0.88 mg/dL (ref 0.44–1.00)
GFR, Estimated: >60 mL/min (ref >60)
Glucose, Bld: 85 mg/dL (ref 70–99)
Potassium: 3.5 mmol/L (ref 3.5–5.1)
Sodium: 135 mmol/L (ref 135–145)

## 2021-03-02 LAB — TROPONIN I (HIGH SENSITIVITY)
Troponin I (High Sensitivity): 2 ng/L (ref <18)
Troponin I (High Sensitivity): 3 ng/L (ref <18)

## 2021-03-02 NOTE — ED Provider Notes (Signed)
[facility] Emergency Department Provider Note  ____________________________________________  Time seen: Approximately 7:41 PM  I have reviewed the triage vital signs and the nursing notes.   HISTORY  Chief Complaint Chest Pain    HPI Gwendolyn [name] is a 50 y.o. female with history of GERD, depression, anxiety who comes ED complaining of intermittent central chest tightness which is nonradiating, no significant shortness of breath, started about 4:30 PM today, lasted few seconds at a time and then resolves.  No aggravating or alleviating factors.  Not exertional, not pleuritic.  No vomiting or diaphoresis.  Patient is a high Education officer, museum.  Denies any increased stressors lately.  Does note she has a history of hiatal hernia and has been taking omeprazole consistently.    Past Medical History:  Diagnosis Date   Anal fissure    Anxiety    Arthritis    Breast cancer ([place]) 2002   lumpectomy-Right   Depression    Family history of brain cancer    Family history of colon cancer    Fibromyalgia    Gallstones    GERD (gastroesophageal reflux disease)    Irritable bowel syndrome    Motion sickness    cars, boats   Personal history of radiation therapy    Yeast vaginitis      Patient Active Problem List   Diagnosis Date Noted   Arthritis of right acromioclavicular joint    Cough 11/13/2020   Acute esophagitis    Stomach irritation    Vaginal discharge 11/23/2019   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   History of tobacco abuse 03/13/2019   Encounter for tobacco use cessation counseling 03/13/2019   Genetic testing 01/19/2019   Family history of brain cancer    Family history of colon cancer    Recurrent UTI 12/08/2018   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   Hematuria 12/05/2017   Rectal bleeding 09/02/2017   Encounter for preventive health examination 11/22/2016   History of abdominal  supracervical subtotal hysterectomy 11/22/2016   Plantar fasciitis 11/10/2015   Vaginitis 03/18/2015   Menopause 01/18/2015   Hemorrhoids 10/06/2014   Hypercholesteremia 10/05/2014   Vitamin D deficiency 10/05/2014   Depressive disorder 10/05/2014   History of breast cancer 10/05/2014   Migraine without aura with status migrainosus 10/05/2014   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   Insomnia 10/05/2014   Bipolar 2 disorder ([place]) 10/05/2014   IBS (irritable bowel syndrome) 10/05/2014   Obstructive sleep apnea 10/05/2014   S/P colectomy 10/05/2014   Hypersomnia 10/05/2014   Anxiety 10/05/2014   Fibromyalgia 10/05/2014   Periodic limb movement disorder 10/05/2014     Past Surgical History:  Procedure Laterality Date   ABDOMINAL HYSTERECTOMY     BREAST BIOPSY Right 2002   Breast cancer DCIS   BREAST BIOPSY     BREAST LUMPECTOMY Right 2003   Radiatiomn Therapy DCIS   CHOLECYSTECTOMY  2000   COLECTOMY  10/31/2012   SECONDARY TO COLONIC INERTIA   ESOPHAGOGASTRODUODENOSCOPY (EGD) WITH PROPOFOL N/A 05/11/2020   Procedure: ESOPHAGOGASTRODUODENOSCOPY (EGD) WITH PROPOFOL;  Surgeon: Virgel Manifold, MD;  Location: [facility] ENDOSCOPY;  Service: Endoscopy;  Laterality: N/A;   REDUCTION MAMMAPLASTY Bilateral 11/13/2018   right breast 60mm DCIS found during breast reduction   SHOULDER ARTHROSCOPY WITH DISTAL CLAVICLE RESECTION Right 11/08/2020   Procedure: RIGHT SHOULDER ARTHROSCOPY, ARTHROSCOPIC DISTAL CLAVICLE EXCISION;  Surgeon: Meredith Pel, MD;  Location: [facility];  Service: Orthopedics;  Laterality: Right;   SUPRACERVICAL ABDOMINAL HYSTERECTOMY  04/10/2011   But still has ovaries.     Prior to Admission medications   Medication Sig Start Date End Date Taking? Authorizing Provider  acetaminophen (TYLENOL) 325 MG tablet Take 2 tablets (650 mg total) by mouth every 4 (four) hours as needed. 11/10/20 11/10/21  Magnant, Charles L, PA-C   amphetamine-dextroamphetamine (ADDERALL) 30 MG tablet Take 30 mg by mouth 2 (two) times daily.  11/02/16   [provider]  APPLE CIDER VINEGAR PO Take 1,600 mg by mouth daily.    [provider]  asenapine (SAPHRIS) 5 MG SUBL 24 hr tablet 5 mg at bedtime. 12/12/20   [provider]  ASHWAGANDHA PO Take 3,000 mg by mouth at bedtime.    [provider]  BLACK ELDERBERRY PO Take 2,000 mg by mouth daily.    [provider]  Cholecalciferol (VITAMIN D) 50 MCG (2000 UT) tablet Take 2,000 Units by mouth daily.    [provider]  clotrimazole-betamethasone (LOTRISONE) cream Apply 1 application topically 2 (two) times daily. 10/19/19   [name], MD  CRANBERRY PO Take 30,000 mg by mouth at bedtime.    [provider]  Cyanocobalamin (VITAMIN B-12) 5000 MCG TBDP Take 5,000 mcg by mouth daily.    [provider]  DULoxetine HCl 40 MG CPEP Take 80 mg by mouth every morning. 03/30/19   [provider]  Ferrous Sulfate (IRON PO) Take 25 mg by mouth daily.    [provider]  FIBER PO Take 6 tablets by mouth in the morning and at bedtime.    [provider]  gabapentin (NEURONTIN) 800 MG tablet TAKE 1 TABLET (800 MG TOTAL) BY MOUTH 3 (THREE) TIMES DAILY AS NEEDED (NERVE PAIN). 11/11/20   Gregor Hams, MD  GINKGO BILOBA PO Take 3,000 mg by mouth in the morning and at bedtime.    [provider]  Melatonin 10 MG TABS Take 10 mg by mouth daily.    [provider]  meloxicam (MOBIC) 15 MG tablet Take 1 tablet (15 mg total) by mouth daily. As needed for joint pain 02/07/21   [name], MD  metFORMIN (GLUCOPHAGE-XR) 500 MG 24 hr tablet TAKE 1 TABLET BY MOUTH EVERY DAY WITH BREAKFAST 09/20/20   [name], MD  metroNIDAZOLE (METROGEL VAGINAL) 0.75 % vaginal gel Place 1 Applicatorful vaginally 2 (two) times daily. 11/24/19   Pleas Koch, NP  mometasone (ELOCON) 0.1 % cream Apply twice  daily with q tip to ear canal for itching/flaking 01/12/21   [name], MD  omeprazole (PRILOSEC) 20 MG capsule TAKE 1 CAPSULE BY MOUTH EVERY DAY 06/08/20   Virgel Manifold, MD  ondansetron (ZOFRAN ODT) 4 MG disintegrating tablet Take 1 tablet (4 mg total) by mouth every 8 (eight) hours as needed for nausea or vomiting. 04/25/20   Carrie Mew, MD  Probiotic Product (PROBIOTIC PO) Take 1 capsule by mouth daily.    [provider]  tiaGABine (GABITRIL) 4 MG tablet Take 4-16 mg by mouth See admin instructions. Take 4 mg in the morning and 16 mg at bedtime    [provider]  varenicline (CHANTIX) 1 MG tablet Take 1 tablet (1 mg total) by mouth 2 (two) times daily. 01/14/21   [name], MD     Allergies Amoxicillin-pot clavulanate, Morphine, and Penicillins   Family History  Problem Relation Age of Onset   Meniere's disease Father  Alcohol abuse Sister    Heart disease Maternal Grandmother    Heart disease Maternal Grandfather    Brain cancer Maternal Grandfather    Heart disease Paternal Grandmother    Heart disease Paternal Grandfather    Rectal cancer Other        MGF's mother   Colon cancer Neg Hx    Esophageal cancer Neg Hx    Breast cancer Neg Hx     Social History Social History   Tobacco Use   Smoking status: Light Smoker    Packs/day: 0.50    Years: 20.00    Pack years: 10.00    Types: Cigarettes   Smokeless tobacco: Never   Tobacco comments:    smoked 10 yrs, quit 20 yrs, smoked last 4 yrs  Vaping Use   Vaping Use: Never used  Substance Use Topics   Alcohol use: Yes    Alcohol/week: 0.0 standard drinks    Comment: OCCASIONALLY   Drug use: No    Review of Systems  Constitutional:   No fever or chills.  ENT:   No sore throat. No rhinorrhea. Cardiovascular:   Positive chest pain as above without syncope. Respiratory:   No dyspnea or cough. Gastrointestinal:   Negative for abdominal pain, vomiting and diarrhea.   Musculoskeletal:   Negative for focal pain or swelling All other systems reviewed and are negative except as documented above in ROS and HPI.  ____________________________________________   PHYSICAL EXAM:  VITAL SIGNS: ED Triage Vitals  Enc Vitals Group     BP 03/02/21 1702 121/73     Pulse Rate 03/02/21 1702 78     Resp 03/02/21 1702 20     Temp 03/02/21 1702 97.8 F (36.6 C)     Temp Source 03/02/21 1702 Oral     SpO2 03/02/21 1702 96 %     Weight 03/02/21 1700 180 lb (81.6 kg)     Height 03/02/21 1700 5\' 6"  (1.676 m)     Head Circumference --      Peak Flow --      Pain Score 03/02/21 1700 2     Pain Loc --      Pain Edu? --      Excl. in [place]? --     Vital signs reviewed, nursing assessments reviewed.   Constitutional:   Alert and oriented. Non-toxic appearance. Eyes:   Conjunctivae are normal. EOMI. PERRL. ENT      Head:   Normocephalic and atraumatic.      Nose:   Wearing a mask.      Mouth/Throat:   Wearing a mask.      Neck:   No meningismus. Full ROM. Hematological/Lymphatic/Immunilogical:   No cervical lymphadenopathy. Cardiovascular:   RRR. Symmetric bilateral radial and DP pulses.  No murmurs. Cap refill less than 2 seconds. Respiratory:   Normal respiratory effort without tachypnea/retractions. Breath sounds are clear and equal bilaterally. No wheezes/rales/rhonchi. Gastrointestinal:   Soft and nontender. Non distended. There is no CVA tenderness.  No rebound, rigidity, or guarding. Genitourinary:   deferred Musculoskeletal:   Normal range of motion in all extremities. No joint effusions.  No lower extremity tenderness.  No edema. Neurologic:   Normal speech and language.  Motor grossly intact. No acute focal neurologic deficits are appreciated.  Skin:    Skin is warm, dry and intact. No rash noted.  No petechiae, purpura, or bullae.  ____________________________________________    LABS (pertinent positives/negatives) (all labs ordered are listed,  but only  abnormal results are displayed) Labs Reviewed  BASIC METABOLIC PANEL - Abnormal; Notable for the following components:      Result Value   Calcium 8.7 (*)    All other components within normal limits  CBC  POC URINE PREG, ED  TROPONIN I (HIGH SENSITIVITY)  TROPONIN I (HIGH SENSITIVITY)   ____________________________________________   EKG  Interpreted by me Normal sinus rhythm rate of 75, normal axis and intervals.  Normal QRS ST segments and T waves.  No acute ischemic changes.  ____________________________________________    [id number]  DG Chest 2 View  Result Date: 03/02/2021 CLINICAL DATA:  Chest pain EXAM: CHEST - 2 VIEW COMPARISON:  04/27/2020 FINDINGS: The heart size and mediastinal contours are within normal limits. Both lungs are clear. The visualized skeletal structures are unremarkable. IMPRESSION: No active cardiopulmonary disease. Electronically Signed   By: Franchot Gallo M.D.   On: 03/02/2021 17:44    ____________________________________________   PROCEDURES Procedures  ____________________________________________  DIFFERENTIAL DIAGNOSIS   Anxiety, GERD, non-STEMI, pneumothorax, pneumonia  CLINICAL IMPRESSION / ASSESSMENT AND PLAN / ED COURSE  Medications ordered in the ED: Medications - No data to display  Pertinent labs & imaging results that were available during my care of the patient were reviewed by me and considered in my medical decision making (see chart for details).  Gwendolyn [name] was evaluated in Emergency Department on 03/02/2021 for the symptoms described in the history of present illness. She was evaluated in the context of the global COVID-19 pandemic, which necessitated consideration that the patient might be at risk for infection with the SARS-CoV-2 virus that causes COVID-19. Institutional protocols and algorithms that pertain to the evaluation of patients at risk for COVID-19 are in a state of rapid change based on  information released by regulatory bodies including the CDC and federal and state organizations. These policies and algorithms were followed during the patient's care in the ED.     Clinical Course as of 03/02/21 1941  Thu Mar 02, 2021  1809 Patient presents with atypical chest pain, currently resolved.  EKG unremarkable.  Chest x-ray viewed and interpreted by me and unremarkable.  Radiology report reviewed.  Labs normal.  Will await repeat troponin due to recent onset and lack of any other explanatory findings.  I suspect this is GERD related to her hiatal hernia, nothing acute and will discharge home with primary care follow-up if delta troponin is negative. [PS]    Clinical Course User Index [PS] Carrie Mew, MD    ----------------------------------------- 7:42 PM on 03/02/2021 ----------------------------------------- Repeat troponin was just drawn, the patient states she is no longer willing to wait in the emergency department.  She request discharge right away and states that she has access to MyChart and we will follow-up the troponin result online.  She agrees to return if repeat number has escalated or any worsening symptoms.   ____________________________________________   FINAL CLINICAL IMPRESSION(S) / ED DIAGNOSES    Final diagnoses:  Atypical chest pain     ED Discharge Orders     None       Portions of this note were generated with dragon dictation software. Dictation errors may occur despite best attempts at proofreading.    Carrie Mew, MD 03/02/21 [phone]

## 2021-03-02 NOTE — ED Notes (Signed)
ED Provider at bedside. 

## 2021-03-02 NOTE — Discharge Instructions (Signed)
Your chest x-ray, EKG, and labs were all normal.  Please check MyChart in an hour to see the result of the second troponin test.  If the repeat test is greater than 20, please return to the emergency department for further evaluation.  Return if you have recurrent severe pain or other new concerns.

## 2021-03-02 NOTE — ED Triage Notes (Signed)
Pt to ED for squeezing feeling in chest radiating to neck and back of shoulder blades at 1430. +shob, dizzy.  Pt in NAD.  Describes pain as intermittent, denies pain at this time

## 2021-03-20 ENCOUNTER — Ambulatory Visit (INDEPENDENT_AMBULATORY_CARE_PROVIDER_SITE_OTHER): Payer: BC Managed Care – PPO | Admitting: Internal Medicine

## 2021-03-20 ENCOUNTER — Other Ambulatory Visit (HOSPITAL_COMMUNITY)
Admission: RE | Admit: 2021-03-20 | Discharge: 2021-03-20 | Disposition: A | Discharge status: Home / Self Care | Payer: BC Managed Care – PPO | Source: Ambulatory Visit | Attending: Internal Medicine | Admitting: Internal Medicine

## 2021-03-20 ENCOUNTER — Encounter: Payer: Self-pay | Admitting: Internal Medicine

## 2021-03-20 ENCOUNTER — Other Ambulatory Visit: Payer: Self-pay

## 2021-03-20 VITALS — BP 116/74 | HR 84 | Temp 98.0°F | Resp 18 | Ht 66.0 in | Wt 191.1 lb

## 2021-03-20 DIAGNOSIS — N029 Recurrent and persistent hematuria with unspecified morphologic changes: Secondary | ICD-10-CM | POA: Diagnosis not present

## 2021-03-20 DIAGNOSIS — R300 Dysuria: Secondary | ICD-10-CM

## 2021-03-20 DIAGNOSIS — R102 Pelvic and perineal pain: Secondary | ICD-10-CM | POA: Diagnosis not present

## 2021-03-20 DIAGNOSIS — R351 Nocturia: Secondary | ICD-10-CM | POA: Insufficient documentation

## 2021-03-20 LAB — URINALYSIS
Bilirubin Urine: NEGATIVE
Ketones, ur: NEGATIVE
Leukocytes,Ua: NEGATIVE
Nitrite: NEGATIVE
Specific Gravity, Urine: 1.02 (ref 1.000–1.030)
Total Protein, Urine: NEGATIVE
Urine Glucose: NEGATIVE
Urobilinogen, UA: 0.2 (ref 0.0–1.0)
pH: 5.5 (ref 5.0–8.0)

## 2021-03-20 LAB — COMPREHENSIVE METABOLIC PANEL
ALT: 12 U/L (ref 0–35)
AST: 13 U/L (ref 0–37)
Albumin: 4.1 g/dL (ref 3.5–5.2)
Alkaline Phosphatase: 33 U/L — ABNORMAL LOW (ref 39–117)
BUN: 11 mg/dL (ref 6–23)
CO2: 29 mEq/L (ref 19–32)
Calcium: 8.9 mg/dL (ref 8.4–10.5)
Chloride: 101 mEq/L (ref 96–112)
Creatinine, Ser: 0.96 mg/dL (ref 0.40–1.20)
GFR: 68.3 mL/min (ref >60.00)
Glucose, Bld: 101 mg/dL — ABNORMAL HIGH (ref 70–99)
Potassium: 4.1 mEq/L (ref 3.5–5.1)
Sodium: 138 mEq/L (ref 135–145)
Total Bilirubin: 0.4 mg/dL (ref 0.2–1.2)
Total Protein: 6.8 g/dL (ref 6.0–8.3)

## 2021-03-20 LAB — CBC WITH DIFFERENTIAL/PLATELET
Basophils Absolute: 0 10*3/uL (ref 0.0–0.1)
Basophils Relative: 0.3 % (ref 0.0–3.0)
Eosinophils Absolute: 0.1 10*3/uL (ref 0.0–0.7)
Eosinophils Relative: 1.5 % (ref 0.0–5.0)
HCT: 39.6 % (ref 36.0–46.0)
Hemoglobin: 13.4 g/dL (ref 12.0–15.0)
Lymphocytes Relative: 20.9 % (ref 12.0–46.0)
Lymphs Abs: 1.5 10*3/uL (ref 0.7–4.0)
MCHC: 33.7 g/dL (ref 30.0–36.0)
MCV: 93.6 fl (ref 78.0–100.0)
Monocytes Absolute: 0.6 10*3/uL (ref 0.1–1.0)
Monocytes Relative: 7.8 % (ref 3.0–12.0)
Neutro Abs: 4.9 10*3/uL (ref 1.4–7.7)
Neutrophils Relative %: 69.5 % (ref 43.0–77.0)
Platelets: 272 10*3/uL (ref 150.0–400.0)
RBC: 4.24 Mil/uL (ref 3.87–5.11)
RDW: 13.9 % (ref 11.5–15.5)
WBC: 7.1 10*3/uL (ref 4.0–10.5)

## 2021-03-20 LAB — POCT URINALYSIS DIPSTICK
Bilirubin, UA: NEGATIVE
Glucose, UA: NEGATIVE
Ketones, UA: NEGATIVE
Leukocytes, UA: NEGATIVE
Nitrite, UA: NEGATIVE
Protein, UA: NEGATIVE
Spec Grav, UA: 1.02 (ref 1.010–1.025)
Urobilinogen, UA: 0.2 E.U./dL
pH, UA: 5.5 (ref 5.0–8.0)

## 2021-03-20 LAB — HEMOGLOBIN A1C: Hgb A1c MFr Bld: 5.5 % (ref 4.6–6.5)

## 2021-03-20 LAB — SEDIMENTATION RATE: Sed Rate: 24 mm/hr (ref 0–30)

## 2021-03-20 MED ORDER — SOLIFENACIN SUCCINATE 5 MG PO TABS: 5.0000 mg | ORAL_TABLET | Freq: Every day | ORAL | 0 refills | Status: DC

## 2021-03-20 NOTE — Patient Instructions (Signed)
Your UA and pelvic exam are non diagnostic (no evidence of infection), but I will send off the urine and the vaginal swab to make sure there is no infection  I am prescribing Vesicare to take after supper to reduce the frequency or urination

## 2021-03-20 NOTE — Assessment & Plan Note (Signed)
Diagnosed years ago  With chronic microscopic hematuria noted on UA

## 2021-03-20 NOTE — Assessment & Plan Note (Signed)
Ruling out PID with vaginal exam and screening for STDs

## 2021-03-20 NOTE — Progress Notes (Signed)
Subjective:  Patient ID: Gwendolyn [name], female    DOB: 1971/04/07  Age: 50 y.o. MRN: [id number]  CC: The primary encounter diagnosis was Dysuria. Diagnoses of Pelvic pain, Suprapubic pain, acute, Nocturia, and Thin basement membrane disease were also pertinent to this visit.  HPI [name] presents for evalaution and treatment of dysuria  This visit occurred during the SARS-CoV-2 public health emergency.  Safety protocols were in place, including screening questions prior to the visit, additional usage of staff PPE, and extensive cleaning of exam room while observing appropriate contact time as indicated for disinfecting solutions.   50 yr old sexually active female presents with 5 day history of profound oncturia unaccompanied by daytime frequency.  Last night was the worst night , had  7-8 normal volume voids  despite abstaining from alcohol.  She drank  only decaffeinated herbal tea in the evening,  2 coffee mugs.  Denies vaginal pain or discharge  but for the past 3 days has had suprapubic pain, bilateral inguinal pain and  and bilateral SI joint pain .    Some mild nausea since Friday .  No fevers ,  but diffuse body aches for the last 2 days that she has attributed to increased activity playing with 2 yr old grandchildren,  she reports feeling  stiff all over, denies headaches.    Reviewed Jan 2022 epiosde of mesenteric adenitis (ER visit with CT scan done _  She recalls that the pain then was severe and accompanied by peritoneal symptoms    Outpatient Medications Prior to Visit  Medication Sig Dispense Refill   acetaminophen (TYLENOL) 325 MG tablet Take 2 tablets (650 mg total) by mouth every 4 (four) hours as needed. 40 tablet 0   amphetamine-dextroamphetamine (ADDERALL) 30 MG tablet Take 30 mg by mouth 2 (two) times daily.   0   asenapine (SAPHRIS) 5 MG SUBL 24 hr tablet 5 mg at bedtime.     ASHWAGANDHA PO Take 3,000 mg by mouth at bedtime.     BLACK  ELDERBERRY PO Take 2,000 mg by mouth daily.     Cholecalciferol (VITAMIN D) 50 MCG (2000 UT) tablet Take 2,000 Units by mouth daily.     CRANBERRY PO Take 30,000 mg by mouth at bedtime.     Cyanocobalamin (VITAMIN B-12) 5000 MCG TBDP Take 5,000 mcg by mouth daily.     DULoxetine HCl 40 MG CPEP Take 80 mg by mouth every morning.     Ferrous Sulfate (IRON PO) Take 25 mg by mouth daily.     FIBER PO Take 6 tablets by mouth in the morning and at bedtime.     gabapentin (NEURONTIN) 800 MG tablet TAKE 1 TABLET (800 MG TOTAL) BY MOUTH 3 (THREE) TIMES DAILY AS NEEDED (NERVE PAIN). 180 tablet 1   GINKGO BILOBA PO Take 3,000 mg by mouth in the morning and at bedtime.     Melatonin 10 MG TABS Take 10 mg by mouth daily.     meloxicam (MOBIC) 15 MG tablet Take 1 tablet (15 mg total) by mouth daily. As needed for joint pain 30 tablet 5   metFORMIN (GLUCOPHAGE-XR) 500 MG 24 hr tablet TAKE 1 TABLET BY MOUTH EVERY DAY WITH BREAKFAST 90 tablet 1   omeprazole (PRILOSEC) 20 MG capsule TAKE 1 CAPSULE BY MOUTH EVERY DAY 30 capsule 0   Probiotic Product (PROBIOTIC PO) Take 1 capsule by mouth daily.     tiaGABine (GABITRIL) 4 MG tablet Take 4-16 mg by mouth See  admin instructions. Take 4 mg in the morning and 16 mg at bedtime     varenicline (CHANTIX) 1 MG tablet Take 1 tablet (1 mg total) by mouth 2 (two) times daily. 60 tablet 2   APPLE CIDER VINEGAR PO Take 1,600 mg by mouth daily. (Patient not taking: Reported on 03/20/2021)     clotrimazole-betamethasone (LOTRISONE) cream Apply 1 application topically 2 (two) times daily. (Patient not taking: Reported on 03/20/2021) 30 g 0   metroNIDAZOLE (METROGEL VAGINAL) 0.75 % vaginal gel Place 1 Applicatorful vaginally 2 (two) times daily. (Patient not taking: Reported on 03/20/2021) 70 g 0   mometasone (ELOCON) 0.1 % cream Apply twice daily with q tip to ear canal for itching/flaking (Patient not taking: Reported on 03/20/2021) 15 g 1   ondansetron (ZOFRAN ODT) 4 MG  disintegrating tablet Take 1 tablet (4 mg total) by mouth every 8 (eight) hours as needed for nausea or vomiting. (Patient not taking: Reported on 03/20/2021) 20 tablet 0   No facility-administered medications prior to visit.    Review of Systems;  Patient denies headache, fevers, malaise, unintentional weight loss, skin rash, eye pain, sinus congestion and sinus pain, sore throat, dysphagia,  hemoptysis , cough, dyspnea, wheezing, chest pain, palpitations, orthopnea, edema, , melena, diarrhea, constipation, , dysuria, hematuria, urinary  Frequency, nocturia, numbness, tingling, seizures,  Focal weakness, Loss of consciousness,  Tremor, insomnia, depression, anxiety, and suicidal ideation.      Objective:  BP 116/74   Pulse 84   Temp 98 F (36.7 C) (Oral)   Resp 18   Ht 5\' 6"  (1.676 m)   Wt 191 lb 2 oz (86.7 kg)   SpO2 99%   BMI 30.85 kg/m   BP Readings from Last 3 Encounters:  03/20/21 116/74  03/02/21 109/71  02/08/21 138/82    Wt Readings from Last 3 Encounters:  03/20/21 191 lb 2 oz (86.7 kg)  03/02/21 180 lb (81.6 kg)  02/08/21 187 lb (84.8 kg)   General Appearance:    Alert, cooperative, no distress, appears stated age  Head:    Normocephalic, without obvious abnormality, atraumatic  Eyes:    PERRL, conjunctiva/corneas clear, EOM's intact, fundi    benign, both eyes  Ears:    Normal TM's and external ear canals, both ears  Nose:   Nares normal, septum midline, mucosa normal, no drainage    or sinus tenderness  Throat:   Lips, mucosa, and tongue normal; teeth and gums normal  Neck:   Supple, symmetrical, trachea midline, no adenopathy;    thyroid:  no enlargement/tenderness/nodules; no carotid   bruit or JVD  Back:     Symmetric, no curvature, ROM normal, no CVA tenderness  Lungs:     Clear to auscultation bilaterally, respirations unlabored  Chest Wall:    No tenderness or deformity   Heart:    Regular rate and rhythm, S1 and S2 normal, no murmur, rub   or  gallop  Breast Exam:    deferred  Abdomen:     Soft, tender bilaterally in lower quadrants without guarding or rebound ,  bowel sounds active all four quadrants,    no masses, no organomegaly  Genitalia:    Pelvic: cervix surgically absent, external genitalia normal, no adnexal masses or tenderness,  rectovaginal septum normal, uterus surgically  absent and vagina normal without discharge  Extremities:   Extremities normal, atraumatic, no cyanosis or edema  Pulses:   2+ and symmetric all extremities  Skin:   Skin color,  texture, turgor normal, no rashes or lesions  Lymph nodes:   Cervical, supraclavicular, and axillary nodes normal  Neurologic:   CNII-XII intact, normal strength, sensation and reflexes    throughout     Lab Results  Component Value Date   HGBA1C 5.1 10/21/2019   HGBA1C 5.7 07/22/2015   HGBA1C 5.5 10/07/2014    Lab Results  Component Value Date   CREATININE 0.88 03/02/2021   CREATININE 0.87 11/02/2020   CREATININE 0.96 06/07/2020    Lab Results  Component Value Date   WBC 8.1 03/02/2021   HGB 12.9 03/02/2021   HCT 39.3 03/02/2021   PLT 271 03/02/2021   GLUCOSE 85 03/02/2021   CHOL 222 (H) 10/21/2019   TRIG 145 10/21/2019   HDL 66 10/21/2019   LDLCALC 130 (H) 10/21/2019   ALT 15 06/07/2020   AST 19 06/07/2020   NA 135 03/02/2021   K 3.5 03/02/2021   CL 102 03/02/2021   CREATININE 0.88 03/02/2021   BUN 17 03/02/2021   CO2 27 03/02/2021   TSH 1.54 10/21/2019   INR 0.85 03/26/2018   HGBA1C 5.1 10/21/2019    DG Chest 2 View  Result Date: 03/02/2021 CLINICAL DATA:  Chest pain EXAM: CHEST - 2 VIEW COMPARISON:  04/27/2020 FINDINGS: The heart size and mediastinal contours are within normal limits. Both lungs are clear. The visualized skeletal structures are unremarkable. IMPRESSION: No active cardiopulmonary disease. Electronically Signed   By: Franchot Gallo M.D.   On: 03/02/2021 17:44    Assessment & Plan:   Problem List Items Addressed This Visit      Thin basement membrane disease    Diagnosed years ago  With chronic microscopic hematuria noted on UA      Nocturia    Etiology unclear given normal dipstick UA (no glucosuria or infectious signs ). Micro and culture pending.  Trial of vesicare 5 mg qhs       Pelvic pain    Ruling out PID with vaginal exam and screening for STDs      Relevant Orders   Cervicovaginal ancillary only( [place])   Other Visit Diagnoses     Dysuria    -  Primary   Relevant Orders   POCT Urinalysis Dipstick (Completed)   Urinalysis   Urine Culture   Cervicovaginal ancillary only( [place])   Suprapubic pain, acute       Relevant Orders   HIV Antibody (routine testing w rflx)   Hemoglobin A1c   CBC with Differential/Platelet   Sedimentation rate   Comprehensive metabolic panel       I am having Anda Tyminski start on solifenacin. I am also having her maintain her tiaGABine, GINKGO BILOBA PO, amphetamine-dextroamphetamine, DULoxetine HCl, clotrimazole-betamethasone, metroNIDAZOLE, ondansetron, ASHWAGANDHA PO, Ferrous Sulfate (IRON PO), CRANBERRY PO, APPLE CIDER VINEGAR PO, Vitamin B-12, Probiotic Product (PROBIOTIC PO), FIBER PO, omeprazole, metFORMIN, Melatonin, BLACK ELDERBERRY PO, Vitamin D, acetaminophen, gabapentin, varenicline, asenapine, mometasone, and meloxicam.  Meds ordered this encounter  Medications   solifenacin (VESICARE) 5 MG tablet    Sig: Take 1 tablet (5 mg total) by mouth daily after supper.    Dispense:  30 tablet    Refill:  0    There are no discontinued medications.  Follow-up: No follow-ups on file.   [name], MD

## 2021-03-20 NOTE — Assessment & Plan Note (Signed)
Etiology unclear given normal dipstick UA (no glucosuria or infectious signs ). Micro and culture pending.  Trial of vesicare 5 mg qhs

## 2021-03-20 NOTE — Progress Notes (Signed)
ct 

## 2021-03-21 LAB — CERVICOVAGINAL ANCILLARY ONLY
Bacterial Vaginitis (gardnerella): NEGATIVE
Candida Glabrata: NEGATIVE
Candida Vaginitis: NEGATIVE
Chlamydia: NEGATIVE
Comment: NEGATIVE
Comment: NEGATIVE
Comment: NEGATIVE
Comment: NEGATIVE
Comment: NEGATIVE
Comment: NORMAL
Neisseria Gonorrhea: NEGATIVE
Trichomonas: NEGATIVE

## 2021-03-21 LAB — URINE CULTURE
MICRO NUMBER:: 12683927
Result:: NO GROWTH
SPECIMEN QUALITY:: ADEQUATE

## 2021-03-21 LAB — HIV ANTIBODY (ROUTINE TESTING W REFLEX): HIV 1&2 Ab, 4th Generation: NONREACTIVE

## 2021-04-11 ENCOUNTER — Other Ambulatory Visit: Payer: Self-pay | Admitting: Internal Medicine

## 2021-04-13 ENCOUNTER — Encounter: Payer: Self-pay | Admitting: Nurse Practitioner

## 2021-04-26 ENCOUNTER — Other Ambulatory Visit: Payer: Self-pay | Admitting: Nurse Practitioner

## 2021-04-26 DIAGNOSIS — N644 Mastodynia: Secondary | ICD-10-CM

## 2021-04-26 DIAGNOSIS — D0511 Intraductal carcinoma in situ of right breast: Secondary | ICD-10-CM

## 2021-05-01 ENCOUNTER — Other Ambulatory Visit: Payer: Self-pay | Admitting: Nurse Practitioner

## 2021-05-01 DIAGNOSIS — N644 Mastodynia: Secondary | ICD-10-CM

## 2021-05-15 ENCOUNTER — Other Ambulatory Visit: Payer: Self-pay | Admitting: Internal Medicine

## 2021-05-19 ENCOUNTER — Other Ambulatory Visit: Payer: Self-pay

## 2021-05-19 ENCOUNTER — Ambulatory Visit
Admission: RE | Admit: 2021-05-19 | Discharge: 2021-05-19 | Disposition: A | Discharge status: Home / Self Care | Payer: BC Managed Care – PPO | Source: Ambulatory Visit | Attending: Nurse Practitioner | Admitting: Nurse Practitioner

## 2021-05-19 DIAGNOSIS — D0511 Intraductal carcinoma in situ of right breast: Secondary | ICD-10-CM | POA: Diagnosis present

## 2021-05-19 DIAGNOSIS — N644 Mastodynia: Secondary | ICD-10-CM

## 2021-05-24 ENCOUNTER — Other Ambulatory Visit: Payer: Self-pay | Admitting: Family Medicine

## 2021-05-24 ENCOUNTER — Other Ambulatory Visit: Payer: Self-pay | Admitting: Internal Medicine

## 2021-05-24 DIAGNOSIS — M797 Fibromyalgia: Secondary | ICD-10-CM

## 2021-05-24 NOTE — Telephone Encounter (Signed)
Rx refill request approved per Dr. Corey's orders. 

## 2021-05-25 ENCOUNTER — Telehealth: Payer: Self-pay

## 2021-05-25 NOTE — Telephone Encounter (Signed)
-----   Message from Alla Feeling, NP sent at 05/24/2021  7:46 AM EST ----- Please let pt know mammo/[place] does not show evidence of malignancy. Please offer to change 2/15 phone f/up to in-person visit. Ok to keep it virtual if she prefers.  Thanks, Regan Rakers, NP

## 2021-05-25 NOTE — Telephone Encounter (Signed)
This nurse reached put to patient and made aware of results and recommendations from the provider mentioned below.  Patient acknowledges understanding and would like to keep her follow up appointment as a phone visit for 2/15.  No further questions or concerns at this time.

## 2021-06-01 ENCOUNTER — Other Ambulatory Visit: Payer: Self-pay | Admitting: Internal Medicine

## 2021-06-01 DIAGNOSIS — F513 Sleepwalking [somnambulism]: Secondary | ICD-10-CM

## 2021-06-01 NOTE — Assessment & Plan Note (Signed)
New onset with significant risks of self harm. Occurring about twice a month.   Referring to neurology for evaluation .

## 2021-06-07 ENCOUNTER — Inpatient Hospital Stay: Payer: BC Managed Care – PPO | Attending: Nurse Practitioner | Admitting: Nurse Practitioner

## 2021-06-07 ENCOUNTER — Encounter: Payer: Self-pay | Admitting: Nurse Practitioner

## 2021-06-07 ENCOUNTER — Ambulatory Visit: Payer: BC Managed Care – PPO | Admitting: Nurse Practitioner

## 2021-06-07 DIAGNOSIS — D0511 Intraductal carcinoma in situ of right breast: Secondary | ICD-10-CM

## 2021-06-07 NOTE — Progress Notes (Signed)
[name]   Telephone:(336) [phone] Fax:(336) [phone]   Clinic Follow up Note   Patient Care Team: [name], MD as PCP - General (Internal Medicine) Alphonsa Overall, MD as Consulting Physician (General Surgery) 06/07/2021  I connected with Gwendolyn [name] on 06/07/21 at 12:30 PM EST by telephone visit and verified that I am speaking with the correct person using two identifiers.   I discussed the limitations, risks, security and privacy concerns of performing an evaluation and management service by telemedicine and the availability of in-person appointments. I also discussed with the patient that there may be a patient responsible charge related to this service. The patient expressed understanding and agreed to proceed.   Other persons participating in the visit and their role in the encounter: None   Patients location: car Providers location: [place] office    CHIEF COMPLAINT: Follow-up history of DCIS  SUMMARY OF ONCOLOGIC HISTORY: Oncology History Overview Note  Cancer Staging DCIS (ductal carcinoma in situ) Staging form: Breast, AJCC 8th Edition - Pathologic stage from 11/13/2018: Stage Unknown (pTis (DCIS), pNX, ER+, PR+, HER2: Not Assessed) - Signed by Truitt Merle, MD on 06/07/2020 Nuclear grade: G1 Residual tumor (R): R0 - None    DCIS (ductal carcinoma in situ)  11/06/2018 Mammogram   Mammogram 11/06/18  IMPRESSION: No mammographic evidence of malignancy. A result letter of this screening mammogram will be mailed directly to the patient.   11/13/2018 Surgery   B/l Breast Reduction   11/13/2018 Pathology Results    Diagnosis 11/13/18 1. Breast, Mammoplasty, right tissue, 76g - DUCTAL CARCINOMA IN SITU. 0.4cm 2. Breast, Mammoplasty, left tissue, 212g - BENIGN BREAST TISSUE.  Estrogen Receptor: 95%, POSITIVE, STRONG STAINING INTENSITY Progesterone Receptor: 95%, POSITIVE, STRONG STAINING INTENSITY   11/13/2018 Cancer Staging   Staging form:  Breast, AJCC 8th Edition - Pathologic stage from 11/13/2018: Stage Unknown (pTis (DCIS), pNX, ER+, PR+, HER2: Not Assessed) - Signed by Truitt Merle, MD on 06/07/2020 Nuclear grade: G1 Residual tumor (R): R0 - None    12/06/2018 Initial Diagnosis   Ductal carcinoma in situ (DCIS) of right breast   01/16/2019 Genetic Testing   Negative genetic testing on the common hereditary cancer panel.  The Common Hereditary Gene Panel offered by [facility] includes sequencing and/or deletion duplication testing of the following 48 genes: APC, ATM, AXIN2, BARD1, BMPR1A, BRCA1, BRCA2, BRIP1, CDH1, CDK4, CDKN2A (p14ARF), CDKN2A (p16INK4a), CHEK2, CTNNA1, DICER1, EPCAM (Deletion/duplication testing only), GREM1 (promoter region deletion/duplication testing only), KIT, MEN1, MLH1, MSH2, MSH3, MSH6, MUTYH, NBN, NF1, NHTL1, PALB2, PDGFRA, PMS2, POLD1, POLE, PTEN, RAD50, RAD51C, RAD51D, RNF43, SDHB, SDHC, SDHD, SMAD4, SMARCA4. STK11, TP53, TSC1, TSC2, and VHL.  The following genes were evaluated for sequence changes only: SDHA and HOXB13 c.251G>A variant only. The report date is January 16, 2019.   02/24/2019 Breast MRI   IMPRESSION: 1. Indeterminate 6 mm enhancing mass in the upper outer left breast. Recommendation is for diagnostic evaluation with mammography and ultrasound. If there is no mammographic or sonographic correlate, recommendation is to proceed with MRI guided biopsy. 2. No suspicious MRI findings on the right.   03/02/2019 Breast US   Bilateral mammo/L br US IMPRESSION: Expected bilateral post reduction surgical scarring as described. No focal abnormality over the upper outer left breast to correlate to the recent MRI finding.   03/12/2019 Breast MRI   MRI Guided biopsy attempt IMPRESSION: The small mass in the upper-outer quadrant of the left breast which was the target of the intended MRI guided  biopsy today is obscured by marked increase in background parenchymal enhancement. Therefore the biopsy  was canceled, and will need to be rescheduled.   03/24/2019 Pathology Results   Diagnosis Breast, left, needle core biopsy, upper outer - FIBROCYSTIC CHANGES. - PRIOR PROCEDURE SITE CHANGES.   11/10/2019 Breast MRI   IMPRESSION: No MR evidence of breast malignancy. RECOMMENDATION: Bilateral diagnostic mammogram in 6 months to resume annual mammogram schedule in this patient with history of RIGHT DCIS in 2020     CURRENT THERAPY: Surveillance  INTERVAL HISTORY: Gwendolyn [name] presents for virtual visit as scheduled, last seen in person in 05/2020.  She sent a message reporting a "crusty sore" on the right breast.  She was due for mammogram anyways which was done 05/19/2021 showing breast density category C, otherwise no abnormality.  Right ultrasound also negative.  She notes the crusting has resolved, denies residual concerns or changes in her breasts.  She is otherwise doing well, energy and appetite are normal, denies pain, abdominal bloating, change in bowel habits, or any other new specific complaints.  She quit smoking and stopped Chantix.  She had a breast exam with PCP at her annual visit in September.  MEDICAL HISTORY:  Past Medical History:  Diagnosis Date   Anal fissure    Anxiety    Arthritis    Breast cancer ([place]) 2002   lumpectomy-Right   Depression    Family history of brain cancer    Family history of colon cancer    Fibromyalgia    Gallstones    GERD (gastroesophageal reflux disease)    Irritable bowel syndrome    Motion sickness    cars, boats   Personal history of radiation therapy    Yeast vaginitis     SURGICAL HISTORY: Past Surgical History:  Procedure Laterality Date   ABDOMINAL HYSTERECTOMY     BREAST BIOPSY Right 2002   Breast cancer DCIS   BREAST BIOPSY     BREAST LUMPECTOMY Right 2003   Radiatiomn Therapy DCIS   CHOLECYSTECTOMY  2000   COLECTOMY  10/31/2012   SECONDARY TO COLONIC INERTIA   ESOPHAGOGASTRODUODENOSCOPY (EGD) WITH PROPOFOL  N/A 05/11/2020   Procedure: ESOPHAGOGASTRODUODENOSCOPY (EGD) WITH PROPOFOL;  Surgeon: Virgel Manifold, MD;  Location: [facility];  Service: Endoscopy;  Laterality: N/A;   REDUCTION MAMMAPLASTY Bilateral 11/13/2018   right breast 59mm DCIS found during breast reduction   SHOULDER ARTHROSCOPY WITH DISTAL CLAVICLE RESECTION Right 11/08/2020   Procedure: RIGHT SHOULDER ARTHROSCOPY, ARTHROSCOPIC DISTAL CLAVICLE EXCISION;  Surgeon: Meredith Pel, MD;  Location: [facility];  Service: Orthopedics;  Laterality: Right;   SUPRACERVICAL ABDOMINAL HYSTERECTOMY  04/10/2011   But still has ovaries.    I have reviewed the social history and family history with the patient and they are unchanged from previous note.  ALLERGIES:  is allergic to amoxicillin-pot clavulanate, morphine, and penicillins.  MEDICATIONS:  Current Outpatient Medications  Medication Sig Dispense Refill   acetaminophen (TYLENOL) 325 MG tablet Take 2 tablets (650 mg total) by mouth every 4 (four) hours as needed. 40 tablet 0   amphetamine-dextroamphetamine (ADDERALL) 30 MG tablet Take 30 mg by mouth 2 (two) times daily.   0   APPLE CIDER VINEGAR PO Take 1,600 mg by mouth daily. (Patient not taking: Reported on 03/20/2021)     asenapine (SAPHRIS) 5 MG SUBL 24 hr tablet 5 mg at bedtime.     ASHWAGANDHA PO Take 3,000 mg by mouth at bedtime.     BLACK ELDERBERRY PO Take  2,000 mg by mouth daily.     Cholecalciferol (VITAMIN D) 50 MCG (2000 UT) tablet Take 2,000 Units by mouth daily.     clotrimazole-betamethasone (LOTRISONE) cream Apply 1 application topically 2 (two) times daily. (Patient not taking: Reported on 03/20/2021) 30 g 0   CRANBERRY PO Take 30,000 mg by mouth at bedtime.     Cyanocobalamin (VITAMIN B-12) 5000 MCG TBDP Take 5,000 mcg by mouth daily.     DULoxetine HCl 40 MG CPEP Take 80 mg by mouth every morning.     Ferrous Sulfate (IRON PO) Take 25 mg by mouth daily.     FIBER PO Take 6 tablets by mouth in the morning  and at bedtime.     gabapentin (NEURONTIN) 800 MG tablet TAKE 1 TABLET (800 MG TOTAL) BY MOUTH 3 (THREE) TIMES DAILY AS NEEDED (NERVE PAIN). 180 tablet 1   GINKGO BILOBA PO Take 3,000 mg by mouth in the morning and at bedtime.     Melatonin 10 MG TABS Take 10 mg by mouth daily.     meloxicam (MOBIC) 15 MG tablet Take 1 tablet (15 mg total) by mouth daily. As needed for joint pain 30 tablet 5   metFORMIN (GLUCOPHAGE-XR) 500 MG 24 hr tablet TAKE 1 TABLET BY MOUTH EVERY DAY WITH BREAKFAST 90 tablet 1   metroNIDAZOLE (METROGEL VAGINAL) 0.75 % vaginal gel Place 1 Applicatorful vaginally 2 (two) times daily. (Patient not taking: Reported on 03/20/2021) 70 g 0   mometasone (ELOCON) 0.1 % cream Apply twice daily with q tip to ear canal for itching/flaking (Patient not taking: Reported on 03/20/2021) 15 g 1   omeprazole (PRILOSEC) 20 MG capsule TAKE 1 CAPSULE BY MOUTH EVERY DAY 30 capsule 0   ondansetron (ZOFRAN ODT) 4 MG disintegrating tablet Take 1 tablet (4 mg total) by mouth every 8 (eight) hours as needed for nausea or vomiting. (Patient not taking: Reported on 03/20/2021) 20 tablet 0   Probiotic Product (PROBIOTIC PO) Take 1 capsule by mouth daily.     solifenacin (VESICARE) 5 MG tablet TAKE 1 TABLET (5 MG TOTAL) BY MOUTH DAILY AFTER SUPPER. 90 tablet 1   tiaGABine (GABITRIL) 4 MG tablet Take 4-16 mg by mouth See admin instructions. Take 4 mg in the morning and 16 mg at bedtime     varenicline (CHANTIX) 1 MG tablet Take 1 tablet (1 mg total) by mouth 2 (two) times daily. 60 tablet 2   No current facility-administered medications for this visit.    PHYSICAL EXAMINATION: ECOG PERFORMANCE STATUS: 0 - Asymptomatic  There were no vitals filed for this visit. There were no vitals filed for this visit.  Patient appears well over the phone.  Voice is strong, speech is clear.  Mood/affect appear normal for situation.  No cough or conversational dyspnea.  LABORATORY DATA:  I have reviewed the data as  listed CBC Latest Ref Rng & Units 03/20/2021 03/02/2021 11/02/2020  WBC 4.0 - 10.5 K/uL 7.1 8.1 9.8  Hemoglobin 12.0 - 15.0 g/dL 13.4 12.9 14.5  Hematocrit 36.0 - 46.0 % 39.6 39.3 45.3  Platelets 150.0 - 400.0 K/uL 272.0 271 315     CMP Latest Ref Rng & Units 03/20/2021 03/02/2021 11/02/2020  Glucose 70 - 99 mg/dL 101(H) 85 100(H)  BUN 6 - 23 mg/dL $Remove'11 17 11  'CxDxiRh$ Creatinine 0.40 - 1.20 mg/dL 0.96 0.88 0.87  Sodium 135 - 145 mEq/L 138 135 135  Potassium 3.5 - 5.1 mEq/L 4.1 3.5 4.0  Chloride 96 -  112 mEq/L 101 102 102  CO2 19 - 32 mEq/L $Remove'29 27 26  'HlupKXJ$ Calcium 8.4 - 10.5 mg/dL 8.9 8.7(L) 9.0  Total Protein 6.0 - 8.3 g/dL 6.8 - -  Total Bilirubin 0.2 - 1.2 mg/dL 0.4 - -  Alkaline Phos 39 - 117 U/L 33(L) - -  AST 0 - 37 U/L 13 - -  ALT 0 - 35 U/L 12 - -      RADIOGRAPHIC STUDIES: I have personally reviewed the radiological images as listed and agreed with the findings in the report. No results found.   ASSESSMENT & PLAN: Gwendolyn [name] is a 51 y.o. 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   -Underwent b/l breast reduction on 11/13/18. 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 This was completely surgically removed. -Based on the [facility] calculator, her 10-year risk of future breast cancer is 19%, which will be reduced with radiation, or antiestrogen therapy.   Due to her previous right breast radiation in the past, additional radiation therapy is not recommended -Genetic testing was negative on 12/2018 -Given the strongly positive ER/PR and premenopausal status, Dr.  Burr Medico recommended antiestrogen therapy with tamoxifen for 5 years, patient declined due to concern for side effects -Due to her high risk for future breast cancer, she is under close surveillance with annual mammogram and breast MRI staggered 6 months apart especially since her second DCIS was not detected by mammogram and her breast density is category C -Last mammo/ultrasound 05/19/2021 negative -Continue surveillance   2. Genetic Testing  -She does not have a family history of cancer but she has had a second DCIS in the same breast. She is eligible for genetic testing.  -She did have testing 7 years ago in [place], [place], but is interested in further testing. -Negative result on 01/06/2019   3. 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 Sliwinski appears to be doing well clinically.  She reportedly had a breast exam 12/2020 with PCP.  Recent bilateral mammogram and right ultrasound are negative.  There is no clinical concern for recurrence.  I recommended continued surveillance, next breast MRI 10/2021, and mammogram 05/2022.  She will continue follow-up with breast exam per PCP in September, and see me back in 1 year.  I encouraged her to continue healthy active lifestyle, smoking cessation, limiting alcohol, and staying up-to-date on age-appropriate health maintenance/cancer screenings.   Orders Placed This Encounter  Procedures   MR BREAST BILATERAL W WO CONTRAST INC CAD    Standing Status:   Future    Standing Expiration Date:   06/07/2022    Order Specific Question:   If indicated for the ordered procedure, I authorize the administration of contrast media per Radiology protocol    Answer:   Yes    Order Specific Question:   What is the patient's sedation requirement?    Answer:   No Sedation    Order Specific Question:   Does the patient have a pacemaker or implanted devices?    Answer:   No    Order Specific Question:  Preferred  imaging location?    Answer:   [facility]    I discussed the assessment and treatment plan with the patient. No barriers to learning were detected. The patient was provided an opportunity to ask questions and all were answered. The patient agreed with the plan and demonstrated an understanding of the instructions. She was advised to call back or seek an in-person evaluation if the symptoms worsen or if the condition fails to improve as anticipated. Total of 7 minutes were spent on the phone call.      Alla Feeling, NP 06/07/21

## 2021-06-08 ENCOUNTER — Telehealth: Payer: Self-pay | Admitting: Hematology

## 2021-06-08 ENCOUNTER — Encounter: Payer: Self-pay | Admitting: Internal Medicine

## 2021-06-08 NOTE — Telephone Encounter (Signed)
Left message with follow-up appointment per 2/15 los. °

## 2021-07-15 ENCOUNTER — Other Ambulatory Visit: Payer: Self-pay | Admitting: Internal Medicine

## 2021-08-17 ENCOUNTER — Other Ambulatory Visit: Payer: Self-pay | Admitting: Internal Medicine

## 2021-08-29 ENCOUNTER — Other Ambulatory Visit: Payer: Self-pay

## 2021-10-12 ENCOUNTER — Ambulatory Visit
Admission: RE | Admit: 2021-10-12 | Discharge: 2021-10-12 | Disposition: A | Discharge status: Home / Self Care | Payer: BC Managed Care – PPO | Source: Ambulatory Visit | Attending: Nurse Practitioner | Admitting: Nurse Practitioner

## 2021-10-12 ENCOUNTER — Encounter: Payer: Self-pay | Admitting: Internal Medicine

## 2021-10-12 ENCOUNTER — Other Ambulatory Visit: Payer: BC Managed Care – PPO

## 2021-10-12 ENCOUNTER — Other Ambulatory Visit: Payer: Self-pay

## 2021-10-12 DIAGNOSIS — D0511 Intraductal carcinoma in situ of right breast: Secondary | ICD-10-CM

## 2021-10-12 DIAGNOSIS — Z0000 Encounter for general adult medical examination without abnormal findings: Secondary | ICD-10-CM

## 2021-10-12 MED ORDER — GADOBUTROL 1 MMOL/ML IV SOLN
6.0000 mL | Freq: Once | INTRAVENOUS | Status: AC | PRN
  Administered 2021-10-12: 6 mL via INTRAVENOUS

## 2021-11-24 ENCOUNTER — Encounter: Payer: Self-pay | Admitting: Internal Medicine

## 2021-11-24 ENCOUNTER — Ambulatory Visit (INDEPENDENT_AMBULATORY_CARE_PROVIDER_SITE_OTHER): Payer: BC Managed Care – PPO | Admitting: Internal Medicine

## 2021-11-24 DIAGNOSIS — E785 Hyperlipidemia, unspecified: Secondary | ICD-10-CM

## 2021-11-24 DIAGNOSIS — Z9049 Acquired absence of other specified parts of digestive tract: Secondary | ICD-10-CM | POA: Diagnosis not present

## 2021-11-24 DIAGNOSIS — F3181 Bipolar II disorder: Secondary | ICD-10-CM

## 2021-11-24 DIAGNOSIS — E663 Overweight: Secondary | ICD-10-CM | POA: Diagnosis not present

## 2021-11-24 MED ORDER — SEMAGLUTIDE(0.25 OR 0.5MG/DOS) 2 MG/3ML ~~LOC~~ SOPN: 0.2500 mg | PEN_INJECTOR | SUBCUTANEOUS | 0 refills | Status: DC

## 2021-11-24 NOTE — Assessment & Plan Note (Addendum)
Risk calculated to be 3 % based on 2022 lipids and /o tobacco abuse  Lab Results  Component Value Date   CHOL 242 (A) 10/20/2020   HDL 53 10/20/2020   LDLCALC 158 10/20/2020   TRIG 172 (A) 10/20/2020   CHOLHDL 3.4 10/21/2019

## 2021-11-24 NOTE — Progress Notes (Signed)
.  Subjective:  Patient ID: Gwendolyn [name], female    DOB: August 08, 1970  Age: 51 y.o. MRN: [id number]  CC: Diagnoses of S/P colectomy, Bipolar 2 disorder ([place]), Overweight (BMI 25.0-29.9), and Hyperlipidemia LDL goal <160 were pertinent to this visit.   HPI Gwendolyn [name] presents for request to pharmacotherapy, specifically she is requestig Ozempic to help her lose weight    Connie is a 51 yr old female of DCIS breast  found in 2002 of the right breast during breast reduction surgery, thin basement membrane disease and IBS who has struggled with her weight for several years  Reached a nadir of 155 lbs in August 20,  kept the weight off until Jan 2021 .  She has had a slow regain since  then  .  Both parents died over the past year.  Had  Complicated relationships with both parents.  Did not lose weight on Green Smoothie Diet, then tried KETO diet Over the past 7 months.  has lost 7 lbs.  No FH or PH of thyroid CA.  NO PH of pancreatitis. She is exercising 3 days a week (dance class  60 minutes)     Outpatient Medications Prior to Visit  Medication Sig Dispense Refill   amphetamine-dextroamphetamine (ADDERALL) 30 MG tablet Take 30 mg by mouth 2 (two) times daily.   0   APPLE CIDER VINEGAR PO Take 1,600 mg by mouth daily.     asenapine (SAPHRIS) 5 MG SUBL 24 hr tablet 5 mg at bedtime.     ASHWAGANDHA PO Take 3,000 mg by mouth at bedtime.     Cholecalciferol (VITAMIN D) 50 MCG (2000 UT) tablet Take 2,000 Units by mouth daily.     clotrimazole-betamethasone (LOTRISONE) cream Apply 1 application topically 2 (two) times daily. 30 g 0   CRANBERRY PO Take 30,000 mg by mouth at bedtime.     Cyanocobalamin (VITAMIN B-12) 5000 MCG TBDP Take 5,000 mcg by mouth daily.     DULoxetine HCl 40 MG CPEP Take 80 mg by mouth every morning.     Ferrous Sulfate (IRON PO) Take 25 mg by mouth daily.     FIBER PO Take 6 tablets by mouth in the morning and at bedtime.     gabapentin (NEURONTIN)  800 MG tablet TAKE 1 TABLET (800 MG TOTAL) BY MOUTH 3 (THREE) TIMES DAILY AS NEEDED (NERVE PAIN). 180 tablet 1   GINKGO BILOBA PO Take 3,000 mg by mouth in the morning and at bedtime.     Melatonin 10 MG TABS Take 10 mg by mouth daily.     meloxicam (MOBIC) 15 MG tablet TAKE 1 TABLET (15 MG TOTAL) BY MOUTH DAILY. AS NEEDED FOR JOINT PAIN 30 tablet 2   metFORMIN (GLUCOPHAGE-XR) 500 MG 24 hr tablet TAKE 1 TABLET BY MOUTH EVERY DAY WITH BREAKFAST 90 tablet 1   omeprazole (PRILOSEC) 20 MG capsule TAKE 1 CAPSULE BY MOUTH EVERY DAY 30 capsule 0   Probiotic Product (PROBIOTIC PO) Take 1 capsule by mouth daily.     tiaGABine (GABITRIL) 4 MG tablet Take 4-16 mg by mouth See admin instructions. Take 4 mg in the morning and 16 mg at bedtime     solifenacin (VESICARE) 5 MG tablet TAKE 1 TABLET (5 MG TOTAL) BY MOUTH DAILY AFTER SUPPER. 90 tablet 1   varenicline (CHANTIX) 1 MG tablet Take 1 tablet (1 mg total) by mouth 2 (two) times daily. 60 tablet 2   BLACK ELDERBERRY PO Take 2,000 mg by mouth  daily. (Patient not taking: Reported on 11/24/2021)     metroNIDAZOLE (METROGEL VAGINAL) 0.75 % vaginal gel Place 1 Applicatorful vaginally 2 (two) times daily. (Patient not taking: Reported on 03/20/2021) 70 g 0   mometasone (ELOCON) 0.1 % cream Apply twice daily with q tip to ear canal for itching/flaking (Patient not taking: Reported on 03/20/2021) 15 g 1   ondansetron (ZOFRAN ODT) 4 MG disintegrating tablet Take 1 tablet (4 mg total) by mouth every 8 (eight) hours as needed for nausea or vomiting. (Patient not taking: Reported on 03/20/2021) 20 tablet 0   No facility-administered medications prior to visit.    Review of Systems;  Patient denies headache, fevers, malaise, unintentional weight loss, skin rash, eye pain, sinus congestion and sinus pain, sore throat, dysphagia,  hemoptysis , cough, dyspnea, wheezing, chest pain, palpitations, orthopnea, edema, abdominal pain, nausea, melena, diarrhea, constipation, flank  pain, dysuria, hematuria, urinary  Frequency, nocturia, numbness, tingling, seizures,  Focal weakness, Loss of consciousness,  Tremor, insomnia, depression, anxiety, and suicidal ideation.      Objective:  BP 118/80 (BP Location: Left Arm, Patient Position: Sitting, Cuff Size: Normal)   Pulse 93   Temp 98.5 F (36.9 C) (Oral)   Resp 14   Ht '5\' 6"'$  (1.676 m)   Wt 184 lb 9.6 oz (83.7 kg)   SpO2 93%   BMI 29.80 kg/m   BP Readings from Last 3 Encounters:  11/24/21 118/80  03/20/21 116/74  03/02/21 109/71    Wt Readings from Last 3 Encounters:  11/24/21 184 lb 9.6 oz (83.7 kg)  03/20/21 191 lb 2 oz (86.7 kg)  03/02/21 180 lb (81.6 kg)    General appearance: alert, cooperative and appears stated age Ears: normal TM's and external ear canals both ears Throat: lips, mucosa, and tongue normal; teeth and gums normal Neck: no adenopathy, no carotid bruit, supple, symmetrical, trachea midline and thyroid not enlarged, symmetric, no tenderness/mass/nodules Back: symmetric, no curvature. ROM normal. No CVA tenderness. Lungs: clear to auscultation bilaterally Heart: regular rate and rhythm, S1, S2 normal, no murmur, click, rub or gallop Abdomen: soft, non-tender; bowel sounds normal; no masses,  no organomegaly Pulses: 2+ and symmetric Skin: Skin color, texture, turgor normal. No rashes or lesions Lymph nodes: Cervical, supraclavicular, and axillary nodes normal.  Lab Results  Component Value Date   HGBA1C 5.5 03/20/2021   HGBA1C 5.4 10/20/2020   HGBA1C 5.4 10/19/2020    Lab Results  Component Value Date   CREATININE 0.96 03/20/2021   CREATININE 0.88 03/02/2021   CREATININE 0.87 11/02/2020    Lab Results  Component Value Date   WBC 7.1 03/20/2021   HGB 13.4 03/20/2021   HCT 39.6 03/20/2021   PLT 272.0 03/20/2021   GLUCOSE 101 (H) 03/20/2021   CHOL 242 (A) 10/20/2020   TRIG 172 (A) 10/20/2020   HDL 53 10/20/2020   LDLCALC 158 10/20/2020   ALT 12 03/20/2021   AST 13  03/20/2021   NA 138 03/20/2021   K 4.1 03/20/2021   CL 101 03/20/2021   CREATININE 0.96 03/20/2021   BUN 11 03/20/2021   CO2 29 03/20/2021   TSH 1.54 10/21/2019   INR 0.85 03/26/2018   HGBA1C 5.5 03/20/2021    MR BREAST BILATERAL W WO CONTRAST INC CAD  Result Date: 10/12/2021 CLINICAL DATA:  Patient with history right-sided DCIS diagnosed 2020. EXAM: BILATERAL BREAST MRI WITH AND WITHOUT CONTRAST TECHNIQUE: Multiplanar, multisequence MR images of both breasts were obtained prior to and following the intravenous  administration of 8 ml of Gadavist Three-dimensional MR images were rendered by post-processing of the original MR data on an independent workstation. The three-dimensional MR images were interpreted, and findings are reported in the following complete MRI report for this study. Three dimensional images were evaluated at the independent interpreting workstation using the DynaCAD thin client. COMPARISON:  MRI breast February 24, 2019 FINDINGS: Breast composition: c. Heterogeneous fibroglandular tissue. Background parenchymal enhancement: Moderate. Right breast: No mass or abnormal enhancement. Left breast: No mass or abnormal enhancement. Lymph nodes: No abnormal appearing lymph nodes. Ancillary findings:  None. IMPRESSION: No MRI evidence to suggest malignancy within either breast. RECOMMENDATION: Continue with annual screening mammography 04/2022. Continue with annual high risk screening MRI. BI-RADS CATEGORY  1: Negative. Electronically Signed   By: Lovey Newcomer M.D.   On: 10/12/2021 14:22   Assessment & Plan:   Problem List Items Addressed This Visit     S/P colectomy    Due to her post surgical anatomy she has had chronic unformed stools since surgery,  This has improved with daily ingestion of marshmallow.       Overweight (BMI 25.0-29.9)    She has had difficulty losing weight despite adherence to diets due to increased appetite and is requesting a trial of  Ozempic .  She has no  contraindications .sample pen given of starting dose. encouarged to increase days of exercise to 5 /week follow up  3 months       Hyperlipidemia LDL goal <160    Risk calculated to be 3 % based on 2022 lipids and /o tobacco abuse  Lab Results  Component Value Date   CHOL 242 (A) 10/20/2020   HDL 53 10/20/2020   LDLCALC 158 10/20/2020   TRIG 172 (A) 10/20/2020   CHOLHDL 3.4 10/21/2019         Bipolar 2 disorder ([facility])    Her mood has been stable on current regimen of medications managed by Dr Nicolasa Ducking.        I spent a total of 51mnutes with this patient in a face to face visit on the date of this encounter reviewing the last office visit with me, her last visit with Dr. KNicolasa Ducking  her  psychiatrist;  patient's diet and eating habits, home blood pressure readings ,  and post visit ordering of testing and therapeutics.    Follow-up: No follow-ups on file.   TCrecencio Mc MD

## 2021-11-24 NOTE — Assessment & Plan Note (Signed)
Due to her post surgical anatomy she has had chronic unformed stools since surgery,  This has improved with daily ingestion of marshmallow.

## 2021-11-24 NOTE — Assessment & Plan Note (Signed)
Her mood has been stable on current regimen of medications managed by Dr Nicolasa Ducking.

## 2021-11-24 NOTE — Assessment & Plan Note (Signed)
She has had difficulty losing weight despite adherence to diets due to increased appetite and is requesting a trial of  Ozempic .  She has no contraindications .sample pen given of starting dose. encouarged to increase days of exercise to 5 /week follow up  3 months

## 2021-11-24 NOTE — Patient Instructions (Signed)
I am  adding Ozempic  to help you lose weight    ozempic is a medication that is taken as a weekly subcutaneous injection. It is not insulin.  It  causes your pancreas to increase its  own insulin secretion  And also slows down the emptying of your stomach,  So it decreases your appetite and helps you lose weight.  The dose for the first 4 weekly doses is 0.25 mg.  You may have mild nausea on the first or second day but this should resolve.  If not  ,  stop the medication.   As long as you are losing weight,  you can continue the dose you are on .  Only increase the dose to 0.5 mg after 4 weeks if your weight has plateaued.  Let me know when you need a refill and what dose you are taking.    Return for fasting labs some time next week

## 2021-11-27 ENCOUNTER — Other Ambulatory Visit (INDEPENDENT_AMBULATORY_CARE_PROVIDER_SITE_OTHER): Payer: BC Managed Care – PPO

## 2021-11-27 ENCOUNTER — Other Ambulatory Visit: Payer: Self-pay | Admitting: Internal Medicine

## 2021-11-27 DIAGNOSIS — R7989 Other specified abnormal findings of blood chemistry: Secondary | ICD-10-CM | POA: Diagnosis not present

## 2021-11-27 DIAGNOSIS — Z0000 Encounter for general adult medical examination without abnormal findings: Secondary | ICD-10-CM | POA: Diagnosis not present

## 2021-11-27 LAB — HEMOGLOBIN A1C: Hgb A1c MFr Bld: 5.5 % (ref 4.6–6.5)

## 2021-11-27 LAB — CBC WITH DIFFERENTIAL/PLATELET
Basophils Absolute: 0 10*3/uL (ref 0.0–0.1)
Basophils Relative: 0.3 % (ref 0.0–3.0)
Eosinophils Absolute: 0.1 10*3/uL (ref 0.0–0.7)
Eosinophils Relative: 2.2 % (ref 0.0–5.0)
HCT: 40.8 % (ref 36.0–46.0)
Hemoglobin: 13.8 g/dL (ref 12.0–15.0)
Lymphocytes Relative: 32.2 % (ref 12.0–46.0)
Lymphs Abs: 2 10*3/uL (ref 0.7–4.0)
MCHC: 33.7 g/dL (ref 30.0–36.0)
MCV: 93.8 fl (ref 78.0–100.0)
Monocytes Absolute: 0.5 10*3/uL (ref 0.1–1.0)
Monocytes Relative: 7.4 % (ref 3.0–12.0)
Neutro Abs: 3.6 10*3/uL (ref 1.4–7.7)
Neutrophils Relative %: 57.9 % (ref 43.0–77.0)
Platelets: 273 10*3/uL (ref 150.0–400.0)
RBC: 4.35 Mil/uL (ref 3.87–5.11)
RDW: 13.4 % (ref 11.5–15.5)
WBC: 6.2 10*3/uL (ref 4.0–10.5)

## 2021-11-27 LAB — LDL CHOLESTEROL, DIRECT: Direct LDL: 155 mg/dL

## 2021-11-27 LAB — TSH: TSH: 3.73 u[IU]/mL (ref 0.35–5.50)

## 2021-11-27 LAB — VITAMIN D 25 HYDROXY (VIT D DEFICIENCY, FRACTURES): VITD: 39.59 ng/mL (ref 30.00–100.00)

## 2021-11-28 LAB — COMPREHENSIVE METABOLIC PANEL
ALT: 14 U/L (ref 0–35)
AST: 14 U/L (ref 0–37)
Albumin: 4.5 g/dL (ref 3.5–5.2)
Alkaline Phosphatase: 31 U/L — ABNORMAL LOW (ref 39–117)
BUN: 13 mg/dL (ref 6–23)
CO2: 26 mEq/L (ref 19–32)
Calcium: 9.8 mg/dL (ref 8.4–10.5)
Chloride: 99 mEq/L (ref 96–112)
Creatinine, Ser: 0.94 mg/dL (ref 0.40–1.20)
GFR: 69.71 mL/min (ref >60.00)
Glucose, Bld: 96 mg/dL (ref 70–99)
Potassium: 4 mEq/L (ref 3.5–5.1)
Sodium: 140 mEq/L (ref 135–145)
Total Bilirubin: 0.6 mg/dL (ref 0.2–1.2)
Total Protein: 7.5 g/dL (ref 6.0–8.3)

## 2021-11-28 LAB — LIPID PANEL
Cholesterol: 258 mg/dL — ABNORMAL HIGH (ref 0–200)
HDL: 56.9 mg/dL (ref >39.00)
NonHDL: 201.4
Total CHOL/HDL Ratio: 5
Triglycerides: 235 mg/dL — ABNORMAL HIGH (ref 0.0–149.0)
VLDL: 47 mg/dL — ABNORMAL HIGH (ref 0.0–40.0)

## 2021-12-11 ENCOUNTER — Other Ambulatory Visit: Payer: BC Managed Care – PPO

## 2021-12-18 ENCOUNTER — Telehealth: Payer: Self-pay

## 2021-12-18 MED ORDER — SEMAGLUTIDE(0.25 OR 0.5MG/DOS) 2 MG/3ML ~~LOC~~ SOPN: 0.2500 mg | PEN_INJECTOR | SUBCUTANEOUS | 0 refills | Status: DC

## 2021-12-18 NOTE — Telephone Encounter (Signed)
Patient states she was supposed to find her own Ozempic.  Patient states she [facility] at [facility] [place].  Patient states she spoke with them this morning at 8am and they have it, but we will need to call quickly or it will get gone.  Patient states she has had one dose of Ozempic and has had no side effects and no weight loss.  Patient states this was a .'5MG'$  dose.  Patient states she needs a .'5MG'$  dose.

## 2021-12-18 NOTE — Telephone Encounter (Signed)
I have spoke with pt to let her know that we have resent the rx.

## 2021-12-18 NOTE — Telephone Encounter (Signed)
Paient called about the note from this morning. She said her pharmacy did not get prescription. Looks like it did not go through.

## 2021-12-18 NOTE — Telephone Encounter (Signed)
Medication has been refilled.

## 2021-12-18 NOTE — Addendum Note (Signed)
Addended by: Adair Laundry on: 12/18/2021 12:33 PM   Modules accepted: Orders

## 2021-12-20 ENCOUNTER — Ambulatory Visit (INDEPENDENT_AMBULATORY_CARE_PROVIDER_SITE_OTHER): Payer: BC Managed Care – PPO

## 2021-12-20 ENCOUNTER — Other Ambulatory Visit: Payer: Self-pay | Admitting: Family Medicine

## 2021-12-20 ENCOUNTER — Encounter: Payer: Self-pay | Admitting: Podiatry

## 2021-12-20 ENCOUNTER — Ambulatory Visit: Payer: BC Managed Care – PPO | Admitting: Podiatry

## 2021-12-20 DIAGNOSIS — M722 Plantar fascial fibromatosis: Secondary | ICD-10-CM | POA: Diagnosis not present

## 2021-12-20 DIAGNOSIS — M797 Fibromyalgia: Secondary | ICD-10-CM

## 2021-12-20 DIAGNOSIS — M7661 Achilles tendinitis, right leg: Secondary | ICD-10-CM

## 2021-12-20 DIAGNOSIS — M7662 Achilles tendinitis, left leg: Secondary | ICD-10-CM | POA: Diagnosis not present

## 2021-12-20 MED ORDER — METHYLPREDNISOLONE 4 MG PO TBPK: ORAL_TABLET | ORAL | 0 refills | Status: DC

## 2021-12-20 MED ORDER — MELOXICAM 15 MG PO TABS: 15.0000 mg | ORAL_TABLET | Freq: Every day | ORAL | 3 refills | Status: DC

## 2021-12-20 MED ORDER — TRIAMCINOLONE ACETONIDE 40 MG/ML IJ SUSP
40.0000 mg | Freq: Once | INTRAMUSCULAR | Status: AC
  Administered 2021-12-20: 40 mg

## 2021-12-20 NOTE — Progress Notes (Signed)
Subjective:  Patient ID: Gwendolyn [name], female    DOB: 12/23/1970,  MRN: [id number] HPI Chief Complaint  Patient presents with   Foot Pain    Plantar/posterior heel bilateral (R>L) - aching severely x 4 days, AM pain, [place] trip next Friday, hx PF, has night splint but hasn't worn it   New Patient (Initial Visit)    Est pt 2017    51 y.o. female presents with the above complaint.   ROS: Denies fever chills nausea vomiting muscle aches pains calf pain back pain chest pain shortness of breath.  Past Medical History:  Diagnosis Date   Anal fissure    Anxiety    Arthritis    Breast cancer ([place]) 2002   lumpectomy-Right   Depression    Family history of brain cancer    Family history of colon cancer    Fibromyalgia    Gallstones    GERD (gastroesophageal reflux disease)    Irritable bowel syndrome    Motion sickness    cars, boats   Personal history of radiation therapy    Yeast vaginitis    Past Surgical History:  Procedure Laterality Date   ABDOMINAL HYSTERECTOMY     BREAST BIOPSY Right 2002   Breast cancer DCIS   BREAST BIOPSY     BREAST LUMPECTOMY Right 2003   Radiatiomn Therapy DCIS   CHOLECYSTECTOMY  2000   COLECTOMY  10/31/2012   SECONDARY TO COLONIC INERTIA   ESOPHAGOGASTRODUODENOSCOPY (EGD) WITH PROPOFOL N/A 05/11/2020   Procedure: ESOPHAGOGASTRODUODENOSCOPY (EGD) WITH PROPOFOL;  Surgeon: Virgel Manifold, MD;  Location: [facility] END[facility];  Service: Endoscopy;  Laterality: N/A;   REDUCTION MAMMAPLASTY Bilateral 11/13/2018   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    Current Outpatient Medications:    meloxicam (MOBIC)  15 MG tablet, Take 1 tablet (15 mg total) by mouth daily., Disp: 30 tablet, Rfl: 3   methylPREDNISolone (MEDROL DOSEPAK) 4 MG TBPK tablet, 6 day dose pack - take as directed, Disp: 21 tablet, Rfl: 0   amphetamine-dextroamphetamine (ADDERALL) 30 MG tablet, Take 30 mg by mouth 2 (two) times daily. , Disp: , Rfl: 0   APPLE CIDER VINEGAR PO, Take 1,600 mg by mouth daily., Disp: , Rfl:    asenapine (SAPHRIS) 5 MG SUBL 24 hr tablet, 5 mg at bedtime., Disp: , Rfl:    ASHWAGANDHA PO, Take 3,000 mg by mouth at bedtime., Disp: , Rfl:    Cholecalciferol (VITAMIN D) 50 MCG (2000 UT) tablet, Take 2,000 Units by mouth daily., Disp: , Rfl:    clotrimazole-betamethasone (LOTRISONE) cream, Apply 1 application topically 2 (two) times daily., Disp: 30 g, Rfl: 0   CRANBERRY PO, Take 30,000 mg by mouth at bedtime., Disp: , Rfl:    Cyanocobalamin (VITAMIN B-12) 5000 MCG TBDP, Take 5,000 mcg by mouth daily., Disp: , Rfl:    DULoxetine HCl 40 MG CPEP, Take 80 mg by mouth every morning., Disp: , Rfl:    Ferrous Sulfate (IRON PO), Take 25 mg by mouth daily., Disp: , Rfl:    FIBER PO, Take 6 tablets by mouth in the morning and at bedtime., Disp: , Rfl:    gabapentin (NEURONTIN) 800 MG tablet, TAKE 1 TABLET (800  MG TOTAL) BY MOUTH 3 (THREE) TIMES DAILY AS NEEDED (NERVE PAIN)., Disp: 180 tablet, Rfl: 1   GINKGO BILOBA PO, Take 3,000 mg by mouth in the morning and at bedtime., Disp: , Rfl:    Melatonin 10 MG TABS, Take 10 mg by mouth daily., Disp: , Rfl:    metFORMIN (GLUCOPHAGE-XR) 500 MG 24 hr tablet, TAKE 1 TABLET BY MOUTH EVERY DAY WITH BREAKFAST, Disp: 90 tablet, Rfl: 1   omeprazole (PRILOSEC) 20 MG capsule, TAKE 1 CAPSULE BY MOUTH EVERY DAY, Disp: 30 capsule, Rfl: 0   Probiotic Product (PROBIOTIC PO), Take 1 capsule by mouth daily., Disp: , Rfl:    Semaglutide,0.25 or 0.'5MG'$ /DOS, 2 MG/3ML SOPN, Inject 0.25 mg into the skin once a week., Disp: 31.5 mL, Rfl: 0   tiaGABine (GABITRIL) 4 MG tablet, Take 4-16 mg by mouth See  admin instructions. Take 4 mg in the morning and 16 mg at bedtime, Disp: , Rfl:   Allergies  Allergen Reactions   Amoxicillin-Pot Clavulanate Nausea Only   Morphine Diarrhea and Nausea Only   Penicillins Diarrhea and Nausea Only   Review of Systems Objective:  There were no vitals filed for this visit.  General: Well developed, nourished, in no acute distress, alert and oriented x3   Dermatological: Skin is warm, dry and supple bilateral. Nails x 10 are well maintained; remaining integument appears unremarkable at this time. There are no open sores, no preulcerative lesions, no rash or signs of infection present.  Vascular: Dorsalis Pedis artery and Posterior Tibial artery pedal pulses are 2/4 bilateral with immedate capillary fill time. Pedal hair growth present. No varicosities and no lower extremity edema present bilateral.   Neruologic: Grossly intact via light touch bilateral. Vibratory intact via tuning fork bilateral. Protective threshold with Semmes Wienstein monofilament intact to all pedal sites bilateral. Patellar and Achilles deep tendon reflexes 2+ bilateral. No Babinski or clonus noted bilateral.   Musculoskeletal: No gross boney pedal deformities bilateral. No pain, crepitus, or limitation noted with foot and ankle range of motion bilateral. Muscular strength 5/5 in all groups tested bilateral.  Pain on palpation medial calcaneal tubercles bilateral some tenderness on palpation of the Achilles tendon at its insertion site.  Gait: Unassisted, Nonantalgic.    Radiographs:  Radiographs taken today demonstrate an osseously mature individual soft tissue increase in density at the plantar fascial calcaneal insertion site.  Achilles tendons appear to be normal.  Mild spurring plantarly and posteriorly.  Assessment & Plan:   Assessment: Planter fasciitis with compensatory Achilles tendinitis  Plan: Injected the bilateral heels plantarly at the plantar fascial [place] insertion  site 2 mg Kenalog 5 mg Marcaine.  Start her on a Medrol Dosepak.  She will follow-up with Mobic 15 mg 1 p.o. daily discussed appropriate shoe gear stretching exercise and ice therapy.  Follow-up with her when she returns from [facility] [place], [place]

## 2021-12-21 NOTE — Telephone Encounter (Signed)
Rx refill request approved per Dr. Corey's orders. 

## 2021-12-26 ENCOUNTER — Ambulatory Visit: Payer: BC Managed Care – PPO | Admitting: Podiatry

## 2022-01-08 ENCOUNTER — Encounter: Payer: Self-pay | Admitting: Internal Medicine

## 2022-01-08 ENCOUNTER — Other Ambulatory Visit (HOSPITAL_COMMUNITY)
Admission: RE | Admit: 2022-01-08 | Discharge: 2022-01-08 | Disposition: A | Discharge status: Home / Self Care | Payer: BC Managed Care – PPO | Source: Ambulatory Visit | Attending: Internal Medicine | Admitting: Internal Medicine

## 2022-01-08 ENCOUNTER — Ambulatory Visit (INDEPENDENT_AMBULATORY_CARE_PROVIDER_SITE_OTHER): Payer: BC Managed Care – PPO | Admitting: Internal Medicine

## 2022-01-08 VITALS — BP 110/72 | HR 72 | Temp 97.4°F | Ht 66.0 in | Wt 181.6 lb

## 2022-01-08 DIAGNOSIS — Z124 Encounter for screening for malignant neoplasm of cervix: Secondary | ICD-10-CM | POA: Insufficient documentation

## 2022-01-08 DIAGNOSIS — Z1231 Encounter for screening mammogram for malignant neoplasm of breast: Secondary | ICD-10-CM | POA: Diagnosis not present

## 2022-01-08 DIAGNOSIS — Z800 Family history of malignant neoplasm of digestive organs: Secondary | ICD-10-CM | POA: Diagnosis not present

## 2022-01-08 DIAGNOSIS — Z0000 Encounter for general adult medical examination without abnormal findings: Secondary | ICD-10-CM

## 2022-01-08 DIAGNOSIS — E663 Overweight: Secondary | ICD-10-CM | POA: Diagnosis not present

## 2022-01-08 MED ORDER — SEMAGLUTIDE (1 MG/DOSE) 4 MG/3ML ~~LOC~~ SOPN: 1.0000 mg | PEN_INJECTOR | SUBCUTANEOUS | 2 refills | Status: DC

## 2022-01-08 NOTE — Patient Instructions (Signed)
Good to see you!   I have increased the dose of ozempic to 1.0 mg weekly (there is no 0.75 mg dose )

## 2022-01-08 NOTE — Assessment & Plan Note (Signed)
She is post colectomy .

## 2022-01-08 NOTE — Assessment & Plan Note (Signed)

## 2022-01-08 NOTE — Progress Notes (Signed)
The patient is here for annual preventive examination and management of other chronic and acute problems.   The risk factors are reflected in the social history.   The roster of all physicians providing medical care to patient - is listed in the Snapshot section of the chart.   Activities of daily living:  The patient is 100% independent in all ADLs: dressing, toileting, feeding as well as independent mobility   Home safety : The patient has smoke detectors in the home. They wear seatbelts.  There are no unsecured firearms at home. There is no violence in the home.    There are moderate risks for hepatitis, STDs  and or HIV. There is no   history of blood transfusion. They have no travel history to infectious disease endemic areas of the world.   The patient has seen their dentist in the last six month. They have seen their eye doctor in the last year. The patinet  denies slight hearing difficulty with regard to whispered voices and some television programs.  They have deferred audiologic testing in the last year.  They do not  have excessive sun exposure. Discussed the need for sun protection: hats, long sleeves and use of sunscreen if there is significant sun exposure.    Diet: the importance of a healthy diet is discussed. They do have a healthy diet.   The benefits of regular aerobic exercise were discussed. The patient  exercises  3 to 5 days per week  for  60 minutes.    Depression screen: there are no signs or vegative symptoms of depression- irritability, change in appetite, anhedonia, sadness/tearfullness.   The following portions of the patient's history were reviewed and updated as appropriate: allergies, current medications, past family history, past medical history,  past surgical history, past social history  and problem list.   Visual acuity was not assessed per patient preference since the patient has regular follow up with an  ophthalmologist. Hearing and body mass index were  assessed and reviewed.    During the course of the visit the patient was educated and counseled about appropriate screening and preventive services including : fall prevention , diabetes screening, nutrition counseling, colorectal cancer screening, and recommended immunizations.    Chief Complaint:  1) overweight:  taking wegovy  currenlty on 0.5 mg weekly has not lost weight.  Has taken 4 doses of 0.5 mg .  No side effects.    Exercising 4 days per week .  Discussed in raising  dose to 1 mg      Review of Symptoms  Patient denies headache, fevers, malaise, unintentional weight loss, skin rash, eye pain, sinus  congestion and sinus pain, sore throat, dysphagia,  hemoptysis , cough, dyspnea, wheezing, chest pain, palpitations, orthopnea, edema, abdominal pain, nausea, melena, diarrhea, constipation, flank pain, dysuria, hematuria, urinary  Frequency, nocturia, numbness, tingling, seizures,  Focal weakness, Loss of consciousness,  Tremor, insomnia, depression, anxiety, and suicidal ideation.    Physical Exam:  BP 110/72 (BP Location: Left Arm, Patient Position: Sitting, Cuff Size: Normal)   Pulse 72   Temp (!) 97.4 F (36.3 C) (Oral)   Ht '5\' 6"'$  (1.676 m)   Wt 181 lb 9.6 oz (82.4 kg)   SpO2 99%   BMI 29.31 kg/m    General Appearance:    Alert, cooperative, no distress, appears stated age  Head:    Normocephalic, without obvious abnormality, atraumatic  Eyes:    PERRL, conjunctiva/corneas clear, EOM's intact, fundi  benign, both eyes  Ears:    Normal TM's and external ear canals, both ears  Nose:   Nares normal, septum midline, mucosa normal, no drainage    or sinus tenderness  Throat:   Lips, mucosa, and tongue normal; teeth and gums normal  Neck:   Supple, symmetrical, trachea midline, no adenopathy;    thyroid:  no enlargement/tenderness/nodules; no carotid   bruit or JVD  Back:     Symmetric, no curvature, ROM normal, no CVA tenderness  Lungs:     Clear to auscultation  bilaterally, respirations unlabored  Chest Wall:    No tenderness or deformity   Heart:    Regular rate and rhythm, S1 and S2 normal, no murmur, rub   or gallop  Breast Exam:    No tenderness, masses, or nipple abnormality  Abdomen:     Soft, non-tender, bowel sounds active all four quadrants,    no masses, no organomegaly  Genitalia:    Pelvic: cervix normal in appearance, external genitalia normal, no adnexal masses or tenderness, no cervical motion tenderness, rectovaginal septum normal, uterus surgically absent  and vagina normal without discharge  Extremities:   Extremities normal, atraumatic, no cyanosis or edema  Pulses:   2+ and symmetric all extremities  Skin:   Skin color, texture, turgor normal, no rashes or lesions  Lymph nodes:   Cervical, supraclavicular, and axillary nodes normal  Neurologic:   CNII-XII intact, normal strength, sensation and reflexes    throughout      Assessment and Plan:  Encounter for preventive health examination age appropriate education and counseling updated, referrals for preventative services and immunizations addressed, dietary and smoking counseling addressed, most recent labs reviewed.  I have personally reviewed and have noted:   1) the patient's medical and social history 2) The pt's use of alcohol, tobacco, and illicit drugs 3) The patient's current medications and supplements 4) Functional ability including ADL's, fall risk, home safety risk, hearing and visual impairment 5) Diet and physical activities 6) Evidence for depression or mood disorder 7) The patient's height, weight, and BMI have been recorded in the chart  I have made referrals, and provided counseling and education based on review of the above  Family history of colon cancer She is post colectomy .   Overweight (BMI 25.0-29.9) Increasing ozempic dose to 1 mg weekly    Updated Medication List Outpatient Encounter Medications as of 01/08/2022  Medication Sig    amphetamine-dextroamphetamine (ADDERALL) 30 MG tablet Take 30 mg by mouth 2 (two) times daily.    APPLE CIDER VINEGAR PO Take 1,600 mg by mouth daily.   asenapine (SAPHRIS) 5 MG SUBL 24 hr tablet 5 mg at bedtime.   ASHWAGANDHA PO Take 3,000 mg by mouth at bedtime.   Cholecalciferol (VITAMIN D) 50 MCG (2000 UT) tablet Take 2,000 Units by mouth daily.   CRANBERRY PO Take 30,000 mg by mouth at bedtime.   Cyanocobalamin (VITAMIN B-12) 5000 MCG TBDP Take 5,000 mcg by mouth daily.   DULoxetine HCl 40 MG CPEP Take 80 mg by mouth every morning.   Ferrous Sulfate (IRON PO) Take 25 mg by mouth daily.   FIBER PO Take 6 tablets by mouth in the morning and at bedtime.   gabapentin (NEURONTIN) 800 MG tablet TAKE 1 TABLET (800 MG TOTAL) BY MOUTH 3 (THREE) TIMES DAILY AS NEEDED (NERVE PAIN).   GINKGO BILOBA PO Take 3,000 mg by mouth in the morning and at bedtime.   Melatonin 10 MG TABS  Take 10 mg by mouth daily.   meloxicam (MOBIC) 15 MG tablet Take 1 tablet (15 mg total) by mouth daily.   metFORMIN (GLUCOPHAGE-XR) 500 MG 24 hr tablet TAKE 1 TABLET BY MOUTH EVERY DAY WITH BREAKFAST   omeprazole (PRILOSEC) 20 MG capsule TAKE 1 CAPSULE BY MOUTH EVERY DAY   Probiotic Product (PROBIOTIC PO) Take 1 capsule by mouth daily.   Semaglutide, 1 MG/DOSE, 4 MG/3ML SOPN Inject 1 mg into the skin once a week.   tiaGABine (GABITRIL) 4 MG tablet Take 4-16 mg by mouth See admin instructions. Take 4 mg in the morning and 16 mg at bedtime   [DISCONTINUED] Semaglutide,0.25 or 0.'5MG'$ /DOS, 2 MG/3ML SOPN Inject 0.25 mg into the skin once a week.   [DISCONTINUED] clotrimazole-betamethasone (LOTRISONE) cream Apply 1 application topically 2 (two) times daily. (Patient not taking: Reported on 01/08/2022)   [DISCONTINUED] methylPREDNISolone (MEDROL DOSEPAK) 4 MG TBPK tablet 6 day dose pack - take as directed (Patient not taking: Reported on 01/08/2022)   No facility-administered encounter medications on file as of 01/08/2022.

## 2022-01-08 NOTE — Assessment & Plan Note (Signed)
Increasing ozempic dose to 1 mg weekly

## 2022-01-11 ENCOUNTER — Telehealth: Payer: Self-pay | Admitting: Internal Medicine

## 2022-01-11 NOTE — Telephone Encounter (Signed)
Pt called in stating she can not get the '1mg'$  ozempic but she can get the .5. Pt request for the provider to write a new prescription for her to take .5 dosage twice each week. [facility] [place]

## 2022-01-12 MED ORDER — SEMAGLUTIDE(0.25 OR 0.5MG/DOS) 2 MG/1.5ML ~~LOC~~ SOPN: 0.5000 mg | PEN_INJECTOR | SUBCUTANEOUS | 2 refills | Status: DC

## 2022-01-12 NOTE — Telephone Encounter (Signed)
[facility] not [facility] yet

## 2022-01-12 NOTE — Telephone Encounter (Signed)
Is it okay for pt to stay at the 0.5 mg dose since [facility] not [facility] the 1 mg dose?

## 2022-01-15 LAB — CYTOLOGY - PAP
Chlamydia: NEGATIVE
Comment: NEGATIVE
Comment: NEGATIVE
Comment: NEGATIVE
Comment: NEGATIVE
Comment: NORMAL
Diagnosis: NEGATIVE
HSV1: NEGATIVE
HSV2: NEGATIVE
High risk HPV: NEGATIVE
Neisseria Gonorrhea: NEGATIVE
Trichomonas: NEGATIVE

## 2022-01-24 ENCOUNTER — Ambulatory Visit: Payer: BC Managed Care – PPO | Admitting: Podiatry

## 2022-02-12 ENCOUNTER — Encounter: Payer: Self-pay | Admitting: Internal Medicine

## 2022-02-12 MED ORDER — SEMAGLUTIDE(0.25 OR 0.5MG/DOS) 2 MG/1.5ML ~~LOC~~ SOPN: 0.5000 mg | PEN_INJECTOR | SUBCUTANEOUS | 2 refills | Status: DC

## 2022-02-28 ENCOUNTER — Other Ambulatory Visit: Payer: Self-pay

## 2022-02-28 MED ORDER — METFORMIN HCL ER 500 MG PO TB24: 500.0000 mg | ORAL_TABLET | Freq: Every day | ORAL | 1 refills | Status: DC

## 2022-04-04 NOTE — Telephone Encounter (Signed)
MyChart messgae sent to patient. 

## 2022-06-07 ENCOUNTER — Other Ambulatory Visit: Payer: BC Managed Care – PPO

## 2022-06-07 ENCOUNTER — Ambulatory Visit: Payer: BC Managed Care – PPO | Admitting: Nurse Practitioner

## 2022-06-25 ENCOUNTER — Telehealth: Payer: Self-pay | Admitting: Internal Medicine

## 2022-06-25 NOTE — Telephone Encounter (Signed)
Alexis from [employer] called in to check the status of a clearance form that was faxed to [place] last week. Any questions, she's '@[phone]'$ [id number]. She said she will re- faxed again today.

## 2022-06-25 NOTE — Telephone Encounter (Signed)
Gwendolyn [name] and she stated that she just received the fax.

## 2022-06-29 ENCOUNTER — Ambulatory Visit
Admission: RE | Admit: 2022-06-29 | Discharge: 2022-06-29 | Disposition: A | Discharge status: Home / Self Care | Payer: BC Managed Care – PPO | Source: Ambulatory Visit | Attending: Internal Medicine | Admitting: Internal Medicine

## 2022-06-29 ENCOUNTER — Other Ambulatory Visit: Payer: Self-pay

## 2022-06-29 DIAGNOSIS — D0511 Intraductal carcinoma in situ of right breast: Secondary | ICD-10-CM

## 2022-06-29 DIAGNOSIS — Z1231 Encounter for screening mammogram for malignant neoplasm of breast: Secondary | ICD-10-CM | POA: Insufficient documentation

## 2022-07-01 NOTE — Assessment & Plan Note (Signed)
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   -Underwent b/l breast reduction on 11/13/18. 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 This was completely surgically removed. -she declined chemo prevention with tamoxifen  -continue close monitoring

## 2022-07-02 ENCOUNTER — Inpatient Hospital Stay: Payer: BC Managed Care – PPO

## 2022-07-02 ENCOUNTER — Telehealth: Payer: Self-pay | Admitting: Hematology

## 2022-07-02 ENCOUNTER — Inpatient Hospital Stay: Payer: BC Managed Care – PPO | Attending: Nurse Practitioner | Admitting: Hematology

## 2022-07-02 ENCOUNTER — Other Ambulatory Visit: Payer: Self-pay

## 2022-07-02 ENCOUNTER — Encounter: Payer: Self-pay | Admitting: Hematology

## 2022-07-02 VITALS — BP 126/82 | HR 81 | Temp 97.8°F | Resp 18 | Ht 66.0 in | Wt 199.6 lb

## 2022-07-02 DIAGNOSIS — Z800 Family history of malignant neoplasm of digestive organs: Secondary | ICD-10-CM | POA: Diagnosis not present

## 2022-07-02 DIAGNOSIS — D0511 Intraductal carcinoma in situ of right breast: Secondary | ICD-10-CM | POA: Insufficient documentation

## 2022-07-02 DIAGNOSIS — Z170 Estrogen receptor positive status [ER+]: Secondary | ICD-10-CM | POA: Diagnosis not present

## 2022-07-02 DIAGNOSIS — Z808 Family history of malignant neoplasm of other organs or systems: Secondary | ICD-10-CM | POA: Diagnosis not present

## 2022-07-02 MED ORDER — TAMOXIFEN CITRATE 10 MG PO TABS: 5.0000 mg | ORAL_TABLET | Freq: Two times a day (BID) | ORAL | 1 refills | Status: DC

## 2022-07-02 NOTE — Progress Notes (Signed)
[place]   Telephone:([phone]) [phone] Fax:(336) [phone]   Clinic Follow up Note   Patient Care Team: [name], MD as PCP - General (Internal Medicine) Alphonsa Overall, MD as Consulting Physician (General Surgery) Truitt Merle, MD as Consulting Physician (Hematology and Oncology) 07/02/2022  CHIEF COMPLAINT: f/u recurrent DCIS   SUMMARY OF ONCOLOGIC HISTORY: Oncology History Overview Note  Cancer Staging DCIS (ductal carcinoma in situ) Staging form: Breast, AJCC 8th Edition - Pathologic stage from 11/13/2018: Stage Unknown (pTis (DCIS), pNX, ER+, PR+, HER2: Not Assessed) - Signed by Truitt Merle, MD on 06/07/2020 Nuclear grade: G1 Residual tumor (R): R0 - None    DCIS (ductal carcinoma in situ)  11/06/2018 Mammogram   Mammogram 11/06/18  IMPRESSION: No mammographic evidence of malignancy. A result letter of this screening mammogram will be mailed directly to the patient.   11/13/2018 Surgery   B/l Breast Reduction   11/13/2018 Pathology Results    Diagnosis 11/13/18 1. Breast, Mammoplasty, right tissue, 76g - DUCTAL CARCINOMA IN SITU. 0.4cm 2. Breast, Mammoplasty, left tissue, 212g - BENIGN BREAST TISSUE.  Estrogen Receptor: 95%, POSITIVE, STRONG STAINING INTENSITY Progesterone Receptor: 95%, POSITIVE, STRONG STAINING INTENSITY   11/13/2018 Cancer Staging   Staging form: Breast, AJCC 8th Edition - Pathologic stage from 11/13/2018: Stage Unknown (pTis (DCIS), pNX, ER+, PR+, HER2: Not Assessed) - Signed by Truitt Merle, MD on 06/07/2020 Nuclear grade: G1 Residual tumor (R): R0 - None   12/06/2018 Initial Diagnosis   Ductal carcinoma in situ (DCIS) of right breast   01/16/2019 Genetic Testing   Negative genetic testing on the common hereditary cancer panel.  The Common Hereditary Gene Panel offered by [facility] includes sequencing and/or deletion duplication testing of the following 48 genes: APC, ATM, AXIN2, BARD1, BMPR1A, BRCA1, BRCA2, BRIP1, CDH1, CDK4, CDKN2A  (p14ARF), CDKN2A (p16INK4a), CHEK2, CTNNA1, DICER1, EPCAM (Deletion/duplication testing only), GREM1 (promoter region deletion/duplication testing only), KIT, MEN1, MLH1, MSH2, MSH3, MSH6, MUTYH, NBN, NF1, NHTL1, PALB2, PDGFRA, PMS2, POLD1, POLE, PTEN, RAD50, RAD51C, RAD51D, RNF43, SDHB, SDHC, SDHD, SMAD4, SMARCA4. STK11, TP53, TSC1, TSC2, and VHL.  The following genes were evaluated for sequence changes only: SDHA and HOXB13 c.251G>A variant only. The report date is January 16, 2019.   02/24/2019 Breast MRI   IMPRESSION: 1. Indeterminate 6 mm enhancing mass in the upper outer left breast. Recommendation is for diagnostic evaluation with mammography and ultrasound. If there is no mammographic or sonographic correlate, recommendation is to proceed with MRI guided biopsy. 2. No suspicious MRI findings on the right.   03/02/2019 Breast US   Bilateral mammo/L br US IMPRESSION: Expected bilateral post reduction surgical scarring as described. No focal abnormality over the upper outer left breast to correlate to the recent MRI finding.   03/12/2019 Breast MRI   MRI Guided biopsy attempt IMPRESSION: The small mass in the upper-outer quadrant of the left breast which was the target of the intended MRI guided biopsy today is obscured by marked increase in background parenchymal enhancement. Therefore the biopsy was canceled, and will need to be rescheduled.   03/24/2019 Pathology Results   Diagnosis Breast, left, needle core biopsy, upper outer - FIBROCYSTIC CHANGES. - PRIOR PROCEDURE SITE CHANGES.   11/10/2019 Breast MRI   IMPRESSION: No MR evidence of breast malignancy. RECOMMENDATION: Bilateral diagnostic mammogram in 6 months to resume annual mammogram schedule in this patient with history of RIGHT DCIS in 2020     CURRENT THERAPY: Surveillance  INTERVAL HISTORY: Gwendolyn [name] is here for a follow up.  She presents to clinic alone.  She has been doing well overall, no concerns of her  breasts.  Patient states she is almost finished with menopause she is having very few hot flashes. Having issues with weight loss, their is a wait list for the weight loss clinic for [facility]. Can't get the ozempic due to shortage at pharmacy. [employer] covers the ozempic but she is not able to get the medication due to short supply.   All other systems were reviewed with the patient and are negative.  MEDICAL HISTORY:  Past Medical History:  Diagnosis Date   Anal fissure    Anxiety    Arthritis    Breast cancer ([place]) 2002   lumpectomy-Right   Depression    Family history of brain cancer    Family history of colon cancer    Fibromyalgia    Gallstones    GERD (gastroesophageal reflux disease)    Irritable bowel syndrome    Motion sickness    cars, boats   Personal history of radiation therapy    Yeast vaginitis     SURGICAL HISTORY: Past Surgical History:  Procedure Laterality Date   ABDOMINAL HYSTERECTOMY     BREAST BIOPSY Right 2002   Breast cancer DCIS   BREAST BIOPSY     BREAST LUMPECTOMY Right 2003   Radiatiomn Therapy DCIS   CHOLECYSTECTOMY  2000   COLECTOMY  10/31/2012   SECONDARY TO COLONIC INERTIA   ESOPHAGOGASTRODUODENOSCOPY (EGD) WITH PROPOFOL N/A 05/11/2020   Procedure: ESOPHAGOGASTRODUODENOSCOPY (EGD) WITH PROPOFOL;  Surgeon: Virgel Manifold, MD;  Location: [facility] ENDOSCOPY;  Service: Endoscopy;  Laterality: N/A;   REDUCTION MAMMAPLASTY Bilateral 11/13/2018   right breast 52m DCIS found during breast reduction   SHOULDER ARTHROSCOPY WITH DISTAL CLAVICLE RESECTION Right 11/08/2020   Procedure: RIGHT SHOULDER ARTHROSCOPY, ARTHROSCOPIC DISTAL CLAVICLE EXCISION;  Surgeon: DMeredith Pel MD;  Location: [facility]  Service: Orthopedics;  Laterality: Right;   SUPRACERVICAL ABDOMINAL HYSTERECTOMY  04/10/2011   But still has ovaries.    I have reviewed the social history and family history with the patient and they are unchanged from previous note.  ALLERGIES:  is  allergic to amoxicillin-pot clavulanate, morphine, and penicillins.  MEDICATIONS:  Current Outpatient Medications  Medication Sig Dispense Refill   tamoxifen (NOLVADEX) 10 MG tablet Take 0.5 tablets (5 mg total) by mouth 2 (two) times daily. 30 tablet 1   amphetamine-dextroamphetamine (ADDERALL) 30 MG tablet Take 30 mg by mouth 2 (two) times daily.   0   APPLE CIDER VINEGAR PO Take 1,600 mg by mouth daily.     asenapine (SAPHRIS) 5 MG SUBL 24 hr tablet 5 mg at bedtime.     ASHWAGANDHA PO Take 3,000 mg by mouth at bedtime.     Cholecalciferol (VITAMIN D) 50 MCG (2000 UT) tablet Take 2,000 Units by mouth daily.     CRANBERRY PO Take 30,000 mg by mouth at bedtime.     Cyanocobalamin (VITAMIN B-12) 5000 MCG TBDP Take 5,000 mcg by mouth daily.     DULoxetine HCl 40 MG CPEP Take 80 mg by mouth every morning.     Ferrous Sulfate (IRON PO) Take 25 mg by mouth daily.     FIBER PO Take 6 tablets by mouth in the morning and at bedtime.     gabapentin (NEURONTIN) 800 MG tablet TAKE 1 TABLET (800 MG TOTAL) BY MOUTH 3 (THREE) TIMES DAILY AS NEEDED (NERVE PAIN). 180 tablet 1   GINKGO BILOBA PO Take 3,000 mg  by mouth in the morning and at bedtime.     Melatonin 10 MG TABS Take 10 mg by mouth daily.     meloxicam (MOBIC) 15 MG tablet Take 1 tablet (15 mg total) by mouth daily. 30 tablet 3   metFORMIN (GLUCOPHAGE-XR) 500 MG 24 hr tablet Take 1 tablet (500 mg total) by mouth daily with breakfast. 90 tablet 1   omeprazole (PRILOSEC) 20 MG capsule TAKE 1 CAPSULE BY MOUTH EVERY DAY 30 capsule 0   Probiotic Product (PROBIOTIC PO) Take 1 capsule by mouth daily.     Semaglutide,0.25 or 0.'5MG'$ /DOS, 2 MG/1.5ML SOPN Inject 0.5 mg into the skin 3 (three) times a week. 4.5 mL 2   tiaGABine (GABITRIL) 4 MG tablet Take 4-16 mg by mouth See admin instructions. Take 4 mg in the morning and 16 mg at bedtime     No current facility-administered medications for this visit.    PHYSICAL EXAMINATION: ECOG PERFORMANCE STATUS: 0  - Asymptomatic  Vitals:   07/02/22 1348  BP: 126/82  Pulse: 81  Resp: 18  Temp: 97.8 F (36.6 C)  SpO2: 99%   Filed Weights   07/02/22 1348  Weight: 199 lb 9.6 oz (90.5 kg)    GENERAL:alert, no distress and comfortable SKIN:(+) skin color, texture, turgor are normal, no rashes or significant lesions EYES: normal, Conjunctiva are pink and non-injected, sclera clear OROPHARYNX:no exudate, no erythema and lips, buccal mucosa, and tongue normal  NECK: (+)supple, thyroid normal size, non-tender, without nodularity LYMPH: (+) no palpable lymphadenopathy in the cervical, axillary or inguinal LUNGS: clear to auscultation and percussion with normal breathing effort HEART: regular rate & rhythm and no murmurs and no lower extremity edema ABDOMEN:(+)abdomen soft, non-tender and normal bowel sounds Musculoskeletal:no cyanosis of digits and no clubbing  NEURO: alert & oriented x 3 with fluent speech, no focal motor/sensory deficits Breasts:(+) Breast inspection showed them to be symmetrical with no nipple discharge.  Bilateral surgical incisionss well-healed.  Palpation of the breasts and axilla revealed no obvious mass that I could appreciate.   LABORATORY DATA:  I have reviewed the data as listed    Latest Ref Rng & Units 11/27/2021    7:46 AM 03/20/2021   11:53 AM 03/02/2021    5:02 PM  CBC  WBC 4.0 - 10.5 K/uL 6.2  7.1  8.1   Hemoglobin 12.0 - 15.0 g/dL 13.8  13.4  12.9   Hematocrit 36.0 - 46.0 % 40.8  39.6  39.3   Platelets 150.0 - 400.0 K/uL 273.0  272.0  271         Latest Ref Rng & Units 11/27/2021    7:46 AM 03/20/2021   11:53 AM 03/02/2021    5:02 PM  CMP  Glucose 70 - 99 mg/dL 96  101  85   BUN 6 - 23 mg/dL '13  11  17   '$ Creatinine 0.40 - 1.20 mg/dL 0.94  0.96  0.88   Sodium 135 - 145 mEq/L 140  138  135   Potassium 3.5 - 5.1 mEq/L 4.0  4.1  3.5   Chloride 96 - 112 mEq/L 99  101  102   CO2 19 - 32 mEq/L '26  29  27   '$ Calcium 8.4 - 10.5 mg/dL 9.8  8.9  8.7   Total  Protein 6.0 - 8.3 g/dL 7.5  6.8    Total Bilirubin 0.2 - 1.2 mg/dL 0.6  0.4    Alkaline Phos 39 - 117 U/L 31  33  AST 0 - 37 U/L 14  13    ALT 0 - 35 U/L 14  12        RADIOGRAPHIC STUDIES: I have personally reviewed the radiological images as listed and agreed with the findings in the report. No results found.   ASSESSMENT & PLAN: 52 y.o. 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   -Underwent b/l breast reduction on 11/13/18. 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 This was completely surgically removed. -she previously declined chemo prevention with tamoxifen.  I again reviewed the benefit and potential side effect, especially low-dose tamoxifen for 3 years.  She is open to tamoxifen now, she has had menopause symptoms for the past 2 years, she knows what to watch. -I called in tamoxifen 10 mg tablets, she will start at 5 mg daily, due to the mild interaction between duloxetine and tamoxifen (duloxetine were increasing metabolism of tamoxifen), if she tolerated 5 mg daily well, she will increased to 10 mg daily from second months.  I explained to her the risk of thrombosis and endometrial cancer is very low given the low-dose of tamoxifen.  She voiced good understanding, and agrees to proceed. -continue close monitoring, she will continue annual screening mammogram and screening breast MRI.  Due to her high co-pay for regular breast MRI, she agrees to proceed with abbreviated MRI.  Plan:  -Abbreviated breast MI at [facility] for breast cancer screening in June 2024 - Went over labs done from outside source -  will start Tamoxifen at 5 mg daily, if she tolerates well, will increase to 10 mg daily from second months.  I called in the prescription today. -Phone visit in 3 months, lab and office f/u in 6 months   Orders Placed This Encounter  Procedures   MR BREAST BILATERAL W [place] [facility]    Abrreviate breast MRI for high risk screening    Standing Status:   Future    Standing Expiration Date:   07/02/2023    Order Specific Question:   If indicated for the ordered procedure, I authorize the administration of contrast media per Radiology protocol    Answer:   Yes    Order Specific Question:   What is the patient's sedation requirement?    Answer:   No Sedation    Order Specific Question:   Does the patient have a pacemaker or implanted devices?    Answer:   No    Order Specific Question:   Radiology Contrast Protocol - do NOT remove file path    Answer:   [email or web address]    Order Specific Question:   Preferred imaging location?    Answer:   [facility] (table limit-550lbs)   All questions were answered. The patient knows to call the clinic with any problems, questions or concerns. No barriers to learning was detected. I spent 25 minutes counseling the patient face to face. The total time spent in the appointment was 30 minutes and more than 50% was on counseling and review of test results     Truitt Merle, MD 07/02/22

## 2022-07-02 NOTE — Telephone Encounter (Signed)
Contacted patient to scheduled appointments. Patient is aware of appointments that are scheduled.   

## 2022-07-03 ENCOUNTER — Encounter: Payer: Self-pay | Admitting: Internal Medicine

## 2022-07-03 NOTE — Telephone Encounter (Signed)
Pt is using for weight loss and has not been seen since 12/2021. Is it okay to refill?

## 2022-07-11 MED ORDER — SEMAGLUTIDE(0.25 OR 0.5MG/DOS) 2 MG/1.5ML ~~LOC~~ SOPN: 0.5000 mg | PEN_INJECTOR | SUBCUTANEOUS | 2 refills | Status: DC

## 2022-07-11 NOTE — Addendum Note (Signed)
Addended by: [name] on: 07/11/2022 06:46 AM   Modules accepted: Orders

## 2022-07-25 ENCOUNTER — Ambulatory Visit (INDEPENDENT_AMBULATORY_CARE_PROVIDER_SITE_OTHER): Payer: BC Managed Care – PPO

## 2022-07-25 ENCOUNTER — Ambulatory Visit: Payer: BC Managed Care – PPO | Admitting: Podiatry

## 2022-07-25 DIAGNOSIS — M7661 Achilles tendinitis, right leg: Secondary | ICD-10-CM

## 2022-07-25 DIAGNOSIS — M7662 Achilles tendinitis, left leg: Secondary | ICD-10-CM

## 2022-07-25 MED ORDER — TRIAMCINOLONE ACETONIDE 10 MG/ML IJ SUSP
10.0000 mg | Freq: Once | INTRAMUSCULAR | Status: AC
  Administered 2022-07-25: 10 mg

## 2022-07-25 NOTE — Progress Notes (Signed)
Subjective:   Patient ID: Gwendolyn [name], female   DOB: 52 y.o.   MRN: [id number]   HPI Patient presents stating her Achilles tendon is been very sore on her right foot and it has been going on for a number of months and she did have previous problems in the bottom of her heel which is done well but this has been very sore for her.  Patient did have surgery patient does smoke lightly and tries to be active   ROS      Objective:  Physical Exam  Neurovascular status intact with quite a bit of inflammation of the posterior Achilles insertion into the calcaneus mostly on the medial side slightly on the lateral with inflammation fluid buildup associated with it     Assessment:  Achilles tendinitis right mostly on the medial side at insertion acute in nature     Plan:  H&P reviewed and I did discuss nature of this problem and the acute inflammation she is experiencing.  I recommended aggressive conservative approach I did explain to her the risk of injection and chances for rupture she is willing to accept this risk wants to undergo this and I will also will immobilize at the same time to help lower the risk I did do careful sterile prep medial side I injected 3 mg Dexasone Kenalog 5 mg Xylocaine into the medial side of the Achilles not into the central or lateral side of the Achilles and then applied air fracture walker with all instructions on usage.  Reappoint to recheck again in 4 weeks or earlier if needed and will wear the boot until that time  X-rays indicate small spur posterior heel no indications of stress fracture arthritis

## 2022-07-30 ENCOUNTER — Other Ambulatory Visit: Payer: Self-pay | Admitting: Hematology

## 2022-08-13 ENCOUNTER — Other Ambulatory Visit: Payer: Self-pay | Admitting: Internal Medicine

## 2022-08-13 ENCOUNTER — Ambulatory Visit: Payer: BC Managed Care – PPO | Admitting: Podiatry

## 2022-08-13 ENCOUNTER — Encounter: Payer: Self-pay | Admitting: Podiatry

## 2022-08-13 DIAGNOSIS — M7661 Achilles tendinitis, right leg: Secondary | ICD-10-CM

## 2022-08-13 DIAGNOSIS — M7662 Achilles tendinitis, left leg: Secondary | ICD-10-CM | POA: Diagnosis not present

## 2022-08-13 MED ORDER — TRIAMCINOLONE ACETONIDE 10 MG/ML IJ SUSP
10.0000 mg | Freq: Once | INTRAMUSCULAR | Status: AC
  Administered 2022-08-13: 10 mg

## 2022-08-15 NOTE — Progress Notes (Signed)
Subjective:   Patient ID: Gwendolyn [name], female   DOB: 52 y.o.   MRN: [id number]   HPI Patient states that her right heel is doing a lot better but she has developed more discomfort in her left heel now and that she would like that treated the same way since the right 1 is better   ROS      Objective:  Physical Exam  Neurovascular status intact significant diminishment of discomfort right Achilles tendon insertion with the left showing inflammation more on the lateral side no central or medial involvement no muscle strength loss     Assessment:  Achilles tendinitis left right improving     Plan:  H&P done sterile prep injected the lateral insertion after explaining risk of injection and she will wear the boot on the left lower leg for the next several weeks.  Reappoint as symptoms indicate use 3 mg dexamethasone Kenalog 5 mg Xylocaine

## 2022-09-23 ENCOUNTER — Other Ambulatory Visit: Payer: Self-pay | Admitting: Internal Medicine

## 2022-09-28 ENCOUNTER — Telehealth: Payer: Self-pay | Admitting: Hematology

## 2022-09-28 NOTE — Telephone Encounter (Signed)
Patient called and wanted to cancel their appointment per voicemail. Patient also stated they would call back to reschedule appointment when they become available

## 2022-10-03 ENCOUNTER — Telehealth: Payer: BC Managed Care – PPO | Admitting: Hematology

## 2022-10-11 ENCOUNTER — Other Ambulatory Visit: Payer: BC Managed Care – PPO

## 2022-10-29 ENCOUNTER — Telehealth: Payer: Self-pay

## 2022-10-29 MED ORDER — SEMAGLUTIDE (2 MG/DOSE) 8 MG/3ML ~~LOC~~ SOPN: 2.0000 mg | PEN_INJECTOR | SUBCUTANEOUS | 2 refills | Status: DC

## 2022-10-29 NOTE — Addendum Note (Signed)
Addended by: Sherlene Shams on: 10/29/2022 04:58 PM   Modules accepted: Orders

## 2022-10-29 NOTE — Telephone Encounter (Signed)
Spoke with pharmacist from [facility]. She stated that they way the Ozempic is sent in as the 0.25/0.5 mg dose and pt taking 0.5 mg three times a week she will run out of rx early. Pharmacist is suggesting that we send in a higher dose. She also ran a test claim on the 4 mg/ 3 mL pen and it went through as approved by her insurance.

## 2022-11-04 ENCOUNTER — Encounter: Payer: Self-pay | Admitting: Internal Medicine

## 2022-11-04 ENCOUNTER — Encounter: Payer: Self-pay | Admitting: Hematology

## 2022-11-05 ENCOUNTER — Other Ambulatory Visit: Payer: Self-pay

## 2022-11-06 NOTE — Telephone Encounter (Signed)
LMTCB pt needs to schedule an appointment to be evaluated.

## 2022-11-12 ENCOUNTER — Ambulatory Visit: Admission: RE | Admit: 2022-11-12 | Payer: BC Managed Care – PPO | Source: Ambulatory Visit

## 2022-11-12 DIAGNOSIS — D0511 Intraductal carcinoma in situ of right breast: Secondary | ICD-10-CM

## 2022-11-12 MED ORDER — GADOPICLENOL 0.5 MMOL/ML IV SOLN
8.0000 mL | Freq: Once | INTRAVENOUS | Status: AC | PRN
  Administered 2022-11-12: 8 mL via INTRAVENOUS

## 2022-11-13 ENCOUNTER — Other Ambulatory Visit: Payer: BC Managed Care – PPO

## 2022-11-17 ENCOUNTER — Other Ambulatory Visit: Payer: Self-pay | Admitting: Internal Medicine

## 2022-11-30 ENCOUNTER — Other Ambulatory Visit: Payer: Self-pay | Admitting: Family Medicine

## 2022-11-30 DIAGNOSIS — M797 Fibromyalgia: Secondary | ICD-10-CM

## 2022-12-04 NOTE — Telephone Encounter (Signed)
Last OV 02/08/21 Next OV not scheduled  Last refill 12/21/21 Qty #180/1

## 2022-12-06 ENCOUNTER — Encounter (INDEPENDENT_AMBULATORY_CARE_PROVIDER_SITE_OTHER): Payer: Self-pay

## 2022-12-29 ENCOUNTER — Other Ambulatory Visit: Payer: Self-pay | Admitting: Internal Medicine

## 2022-12-31 ENCOUNTER — Telehealth: Payer: Self-pay

## 2022-12-31 DIAGNOSIS — E663 Overweight: Secondary | ICD-10-CM

## 2022-12-31 MED ORDER — SEMAGLUTIDE-WEIGHT MANAGEMENT 1 MG/0.5ML ~~LOC~~ SOAJ: 1.0000 mg | SUBCUTANEOUS | 2 refills | Status: DC

## 2022-12-31 NOTE — Addendum Note (Signed)
Addended by: Sherlene Shams on: 12/31/2022 08:02 PM   Modules accepted: Orders

## 2022-12-31 NOTE — Telephone Encounter (Signed)
Pharmacy Patient Advocate Encounter   Received notification from CoverMyMeds that prior authorization for Ozempic (2 MG/DOSE) 8MG /3ML pen-injector is required/requested.   Insurance verification completed.   The patient is insured through [facility] .   Per test claim: PA required; PA submitted to [facility] via CoverMyMeds Key/confirmation #/EOC [id number] Status is pending

## 2022-12-31 NOTE — Telephone Encounter (Signed)
PA for ozempic denied.  Will try switchingt to [facility]

## 2022-12-31 NOTE — Telephone Encounter (Signed)
Pharmacy Patient Advocate Encounter  Received notification from [facility] that Prior Authorization for Ozempic (2 MG/DOSE) 8MG /3ML pen-injectors has been DENIED.  Full denial letter will be uploaded to the media tab. See denial reason below.  Your plan only covers this drug when it is used for certain health conditions. Covered use is for type 2 diabetes mellitus. Your plan does not cover the drug for your health condition that your doctor told us you have.   PA #/Case ID/Reference #: [id number]   Please be advised we currently do not have a Pharmacist to review denials, therefore you will need to process appeals accordingly as needed. Thanks for your support at this time. Contact for appeals are as follows: Phone: [phone], Fax: [phone]   Last day to appeal is 180 days of date of letter (12-31-2022)

## 2022-12-31 NOTE — Assessment & Plan Note (Signed)
Ozempic not covered by insurance.  Will determine if patient wats to pay OOP

## 2023-01-01 NOTE — Telephone Encounter (Signed)
Pt is aware and gave a verbal understanding.  

## 2023-01-02 ENCOUNTER — Inpatient Hospital Stay: Payer: BC Managed Care – PPO | Attending: Hematology

## 2023-01-02 ENCOUNTER — Inpatient Hospital Stay: Payer: BC Managed Care – PPO | Admitting: Hematology

## 2023-01-02 NOTE — Assessment & Plan Note (Deleted)
-  She had right breast DCIS when she was 30, s/p lumpectomy and radiation, she declined antiestrogen therapy at that time.   -Underwent b/l breast reduction on 11/13/18. 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 This was completely surgically removed. -she declined chemo prevention with tamoxifen   -continue close monitoring

## 2023-01-07 ENCOUNTER — Encounter: Payer: Self-pay | Admitting: Internal Medicine

## 2023-01-07 DIAGNOSIS — E663 Overweight: Secondary | ICD-10-CM

## 2023-01-07 DIAGNOSIS — E785 Hyperlipidemia, unspecified: Secondary | ICD-10-CM

## 2023-01-15 ENCOUNTER — Encounter: Payer: Self-pay | Admitting: Internal Medicine

## 2023-01-15 ENCOUNTER — Ambulatory Visit (INDEPENDENT_AMBULATORY_CARE_PROVIDER_SITE_OTHER): Payer: BC Managed Care – PPO | Admitting: Internal Medicine

## 2023-01-15 ENCOUNTER — Other Ambulatory Visit (HOSPITAL_COMMUNITY)
Admission: RE | Admit: 2023-01-15 | Discharge: 2023-01-15 | Disposition: A | Discharge status: Home / Self Care | Payer: BC Managed Care – PPO | Source: Ambulatory Visit | Attending: Internal Medicine | Admitting: Internal Medicine

## 2023-01-15 VITALS — BP 108/62 | HR 78 | Ht 66.0 in | Wt 182.0 lb

## 2023-01-15 DIAGNOSIS — E663 Overweight: Secondary | ICD-10-CM | POA: Diagnosis not present

## 2023-01-15 DIAGNOSIS — Z90711 Acquired absence of uterus with remaining cervical stump: Secondary | ICD-10-CM | POA: Diagnosis not present

## 2023-01-15 DIAGNOSIS — Z0000 Encounter for general adult medical examination without abnormal findings: Secondary | ICD-10-CM | POA: Diagnosis not present

## 2023-01-15 DIAGNOSIS — E785 Hyperlipidemia, unspecified: Secondary | ICD-10-CM

## 2023-01-15 DIAGNOSIS — R634 Abnormal weight loss: Secondary | ICD-10-CM | POA: Diagnosis not present

## 2023-01-15 DIAGNOSIS — Z124 Encounter for screening for malignant neoplasm of cervix: Secondary | ICD-10-CM | POA: Diagnosis present

## 2023-01-15 DIAGNOSIS — Z9889 Other specified postprocedural states: Secondary | ICD-10-CM | POA: Insufficient documentation

## 2023-01-15 DIAGNOSIS — Z853 Personal history of malignant neoplasm of breast: Secondary | ICD-10-CM | POA: Diagnosis not present

## 2023-01-15 MED ORDER — METFORMIN HCL ER 500 MG PO TB24: ORAL_TABLET | ORAL | 0 refills | Status: DC

## 2023-01-15 NOTE — Assessment & Plan Note (Signed)

## 2023-01-15 NOTE — Assessment & Plan Note (Signed)
She has not used ozempic in a month due to insurnace non payments.  Information given about [employer] mail order program for [employer]

## 2023-01-15 NOTE — Patient Instructions (Addendum)
tHERE ARE 3 TRUSTED SOURCES FOR THE WEIGHT LOSS INJECTIBLE DRUG   [facility]:  clinic in [place] offers the shots as part of a weight management program .   [facility] in [place] compounds semiglutide ; you can call them and request the "how to get it"    [employer] offers Zepbound  via Mail order and zepbound has more dose choices   than ozempic (2.5,  5,  7.5  10 ,  12   and 15 mg)   EXERCISE!!   For toning and additional success

## 2023-01-15 NOTE — Progress Notes (Addendum)
Patient ID: Gwendolyn [name], female    DOB: 09/16/1970  Age: 52 y.o. MRN: [id number]  The patient is here for annual preventive examination and management of other chronic and acute problems.   The risk factors are reflected in the social history.   The roster of all physicians providing medical care to patient - is listed in the Snapshot section of the chart.   Activities of daily living:  The patient is 100% independent in all ADLs: dressing, toileting, feeding as well as independent mobility   Home safety : The patient has smoke detectors in the home. They wear seatbelts.  There are no unsecured firearms at home. There is no violence in the home.    There is no risks for hepatitis, STDs or HIV. There is no   history of blood transfusion. They have no travel history to infectious disease endemic areas of the world.   The patient has seen their dentist in the last six month. They have seen their eye doctor in the last year. The patinet  denies slight hearing difficulty with regard to whispered voices and some television programs.  They have deferred audiologic testing in the last year.  They do not  have excessive sun exposure. Discussed the need for sun protection: hats, long sleeves and use of sunscreen if there is significant sun exposure.    Diet: the importance of a healthy diet is discussed. They do have a healthy diet.   The benefits of regular aerobic exercise were discussed. The patient  exercises  3 to 5 days per week  for  60 minutes.    Depression screen: there are no signs or vegative symptoms of depression- irritability, change in appetite, anhedonia, sadness/tearfullness.   The following portions of the patient's history were reviewed and updated as appropriate: allergies, current medications, past family history, past medical history,  past surgical history, past social history  and problem list.   Visual acuity was not assessed per patient preference since the patient  has regular follow up with an  ophthalmologist. Hearing and body mass index were assessed and reviewed.    During the course of the visit the patient was educated and counseled about appropriate screening and preventive services including : fall prevention , diabetes screening, nutrition counseling, colorectal cancer screening, and recommended immunizations.    Chief Complaint:  1) weight management:  has not taken ozempic for the last month due to insurance non payment   she is down  18 lbs from last visit in march .  Had upper and lower abdominal and chin  liposuction by [employer] board certified plastic surgeon in [place]  in March/April  8 lbs . Went home with a drain,  back to work in 5 days  no complications.  .  Wants to lose 18 more lbs.  Not exercising due to bone spurs on backs of heels .  Having left breast reduction in December due to asymmetry .  2) Stress:  husband Molly Maduro is recovering from Whipple surgery for pancreatic CA at 51.  Laid off from work.  Her daughter had a baby.   3  Review of Symptoms  Patient denies headache, fevers, malaise, unintentional weight loss, skin rash, eye pain, sinus congestion and sinus pain, sore throat, dysphagia,  hemoptysis , cough, dyspnea, wheezing, chest pain, palpitations, orthopnea, edema, abdominal pain, nausea, melena, diarrhea, constipation, flank pain, dysuria, hematuria, urinary  Frequency, nocturia, numbness, tingling, seizures,  Focal weakness, Loss of consciousness,  Tremor, insomnia, depression, anxiety,  and suicidal ideation.    Physical Exam:  BP 108/62   Pulse 78   Ht 5\' 6"  (1.676 m)   Wt 182 lb (82.6 kg)   SpO2 97%   BMI 29.38 kg/m    Physical Exam Vitals reviewed.  Constitutional:      General: She is not in acute distress.    Appearance: Normal appearance. She is well-developed and normal weight. She is not ill-appearing, toxic-appearing or diaphoretic.  HENT:     Head: Normocephalic.     Right Ear: Tympanic  membrane, ear canal and external ear normal. There is no impacted cerumen.     Left Ear: Tympanic membrane, ear canal and external ear normal. There is no impacted cerumen.     Nose: Nose normal.     Mouth/Throat:     Mouth: Mucous membranes are moist.     Pharynx: Oropharynx is clear.  Eyes:     General: No scleral icterus.       Right eye: No discharge.        Left eye: No discharge.     Conjunctiva/sclera: Conjunctivae normal.     Pupils: Pupils are equal, round, and reactive to light.  Neck:     Thyroid: No thyromegaly.     Vascular: No carotid bruit or JVD.  Cardiovascular:     Rate and Rhythm: Normal rate and regular rhythm.     Heart sounds: Normal heart sounds.  Pulmonary:     Effort: Pulmonary effort is normal. No respiratory distress.     Breath sounds: Normal breath sounds.  Chest:  Breasts:    Breasts are symmetrical.     Right: Normal. No swelling, inverted nipple, mass, nipple discharge, skin change or tenderness.     Left: Normal. No swelling, inverted nipple, mass, nipple discharge, skin change or tenderness.  Abdominal:     General: Bowel sounds are normal.     Palpations: Abdomen is soft. There is no mass.     Tenderness: There is no abdominal tenderness. There is no guarding or rebound.     Hernia: There is no hernia in the left inguinal area or right inguinal area.     Comments: Healing surgical scar   Genitourinary:    Exam position: Lithotomy position.     Pubic Area: No rash or pubic lice.      Labia:        Right: No rash, tenderness, lesion or injury.        Left: No rash, tenderness, lesion or injury.      Vagina: Normal.     Cervix: Normal.     Uterus: Absent.   Musculoskeletal:        General: Normal range of motion.     Cervical back: Normal range of motion and neck supple.  Lymphadenopathy:     Cervical: No cervical adenopathy.     Upper Body:     Right upper body: No supraclavicular, axillary or pectoral adenopathy.     Left upper body:  No supraclavicular, axillary or pectoral adenopathy.     Lower Body: No right inguinal adenopathy. No left inguinal adenopathy.  Skin:    General: Skin is warm and dry.  Neurological:     General: No focal deficit present.     Mental Status: She is alert and oriented to person, place, and time. Mental status is at baseline.  Psychiatric:        Mood and Affect: Mood normal.  Behavior: Behavior normal.        Thought Content: Thought content normal.        Judgment: Judgment normal.    Assessment and Plan: Encounter for preventive health examination Assessment & Plan: age appropriate education and counseling updated, referrals for preventative services and immunizations addressed, dietary and smoking counseling addressed, most recent labs reviewed.  I have personally reviewed and have noted:   1) the patient's medical and social history 2) The pt's use of alcohol, tobacco, and illicit drugs 3) The patient's current medications and supplements 4) Functional ability including ADL's, fall risk, home safety risk, hearing and visual impairment 5) Diet and physical activities 6) Evidence for depression or mood disorder 7) The patient's height, weight, and BMI have been recorded in the chart  I have made referrals, and provided counseling and education based on review of the above    Cervical cancer screening -     Cytology - PAP  History of abdominal supracervical subtotal hysterectomy Assessment & Plan: PAP smear done annually    History of breast cancer in adulthood Assessment & Plan: MRI bilateral breast done in July 2024.  Mammogram due in 8 months.  For breast reduction in December of left breast    Hyperlipidemia LDL goal <160 Assessment & Plan: Triglycerides are elevated today to over 300.  Recommended a low glycemic index diet, weight  loss and regular exercise.  Advised t o  repeat in 3 months.   Lab Results  Component Value Date   CHOL 223* 05/19/2014   HDL  52.20 05/19/2014   LDLDIRECT 109.0 05/19/2014   TRIG 343.0* 05/19/2014   CHOLHDL 4 05/19/2014   Lab Results  Component Value Date   ALT 20 05/19/2014   AST 24 05/19/2014   ALKPHOS 73 05/19/2014   BILITOT 0.4 05/19/2014       Lab Results  Component Value Date   CHOL 213 (H) 01/15/2023   HDL 52 01/15/2023   LDLCALC 106 (H) 01/15/2023   LDLDIRECT 155.0 11/27/2021   TRIG 388 (H) 01/15/2023   CHOLHDL 4.1 01/15/2023     Orders: -     Lipid Panel w/reflex Direct LDL  Weight loss -     Comprehensive metabolic panel -     CBC with Differential/Platelet -     Hemoglobin A1c -     TSH  Overweight (BMI 25.0-29.9) Assessment & Plan: She has not used ozempic in a month due to insurnace non payments.  Information given about [employer] mail order program for Zepbound    H/O liposuction of abdomen Assessment & Plan: Done in march with loss of 8 lbs.    Other orders -     metFORMIN HCl ER; TAKE 1 TABLET BY MOUTH EVERY DAY WITH BREAKFAST  Dispense: 30 tablet; Refill: 0    No follow-ups on file.  Sherlene Shams, MD

## 2023-01-15 NOTE — Assessment & Plan Note (Addendum)
MRI bilateral breast done in July 2024.  Mammogram due in 8 months.  For breast reduction in December of left breast

## 2023-01-15 NOTE — Assessment & Plan Note (Signed)
Done in march with loss of 8 lbs.

## 2023-01-15 NOTE — Assessment & Plan Note (Signed)
PAP smear done annually

## 2023-01-16 LAB — COMPREHENSIVE METABOLIC PANEL
ALT: 11 U/L (ref 0–35)
AST: 14 U/L (ref 0–37)
Albumin: 4 g/dL (ref 3.5–5.2)
Alkaline Phosphatase: 28 U/L — ABNORMAL LOW (ref 39–117)
BUN: 14 mg/dL (ref 6–23)
CO2: 29 mEq/L (ref 19–32)
Calcium: 8.9 mg/dL (ref 8.4–10.5)
Chloride: 101 mEq/L (ref 96–112)
Creatinine, Ser: 0.9 mg/dL (ref 0.40–1.20)
GFR: 72.86 mL/min (ref >60.00)
Glucose, Bld: 76 mg/dL (ref 70–99)
Potassium: 4.1 mEq/L (ref 3.5–5.1)
Sodium: 138 mEq/L (ref 135–145)
Total Bilirubin: 0.4 mg/dL (ref 0.2–1.2)
Total Protein: 6.8 g/dL (ref 6.0–8.3)

## 2023-01-16 LAB — CBC WITH DIFFERENTIAL/PLATELET
Basophils Absolute: 0.1 10*3/uL (ref 0.0–0.1)
Basophils Relative: 1.4 % (ref 0.0–3.0)
Eosinophils Absolute: 0.1 10*3/uL (ref 0.0–0.7)
Eosinophils Relative: 1 % (ref 0.0–5.0)
HCT: 40.3 % (ref 36.0–46.0)
Hemoglobin: 13.1 g/dL (ref 12.0–15.0)
Lymphocytes Relative: 24.4 % (ref 12.0–46.0)
Lymphs Abs: 1.8 10*3/uL (ref 0.7–4.0)
MCHC: 32.5 g/dL (ref 30.0–36.0)
MCV: 94.2 fl (ref 78.0–100.0)
Monocytes Absolute: 0.5 10*3/uL (ref 0.1–1.0)
Monocytes Relative: 6.9 % (ref 3.0–12.0)
Neutro Abs: 4.9 10*3/uL (ref 1.4–7.7)
Neutrophils Relative %: 66.3 % (ref 43.0–77.0)
Platelets: 285 10*3/uL (ref 150.0–400.0)
RBC: 4.28 Mil/uL (ref 3.87–5.11)
RDW: 14.2 % (ref 11.5–15.5)
WBC: 7.5 10*3/uL (ref 4.0–10.5)

## 2023-01-16 LAB — LIPID PANEL W/REFLEX DIRECT LDL
Cholesterol: 213 mg/dL — ABNORMAL HIGH (ref <200)
HDL: 52 mg/dL (ref >=50)
LDL Cholesterol (Calc): 106 mg/dL (calc) — ABNORMAL HIGH
Non-HDL Cholesterol (Calc): 161 mg/dL (calc) — ABNORMAL HIGH (ref <130)
Total CHOL/HDL Ratio: 4.1 (calc) (ref <5.0)
Triglycerides: 388 mg/dL — ABNORMAL HIGH (ref <150)

## 2023-01-16 LAB — TSH: TSH: 1.74 u[IU]/mL (ref 0.35–5.50)

## 2023-01-16 LAB — HEMOGLOBIN A1C: Hgb A1c MFr Bld: 5.4 % (ref 4.6–6.5)

## 2023-01-19 NOTE — Assessment & Plan Note (Signed)
Triglycerides are elevated today to over 300.  Recommended a low glycemic index diet, weight  loss and regular exercise.  Advised t o  repeat in 3 months.   Lab Results  Component Value Date   CHOL 223* 05/19/2014   HDL 52.20 05/19/2014   LDLDIRECT 109.0 05/19/2014   TRIG 343.0* 05/19/2014   CHOLHDL 4 05/19/2014   Lab Results  Component Value Date   ALT 20 05/19/2014   AST 24 05/19/2014   ALKPHOS 73 05/19/2014   BILITOT 0.4 05/19/2014       Lab Results  Component Value Date   CHOL 213 (H) 01/15/2023   HDL 52 01/15/2023   LDLCALC 106 (H) 01/15/2023   LDLDIRECT 155.0 11/27/2021   TRIG 388 (H) 01/15/2023   CHOLHDL 4.1 01/15/2023

## 2023-01-21 LAB — CYTOLOGY - PAP
Comment: NEGATIVE
Diagnosis: NEGATIVE
High risk HPV: NEGATIVE

## 2023-01-24 ENCOUNTER — Other Ambulatory Visit: Payer: Self-pay | Admitting: Internal Medicine

## 2023-01-27 ENCOUNTER — Encounter: Payer: Self-pay | Admitting: Internal Medicine

## 2023-01-27 DIAGNOSIS — R300 Dysuria: Secondary | ICD-10-CM

## 2023-01-28 ENCOUNTER — Telehealth: Payer: BC Managed Care – PPO | Admitting: Internal Medicine

## 2023-01-28 ENCOUNTER — Encounter: Payer: Self-pay | Admitting: Internal Medicine

## 2023-01-28 ENCOUNTER — Other Ambulatory Visit (INDEPENDENT_AMBULATORY_CARE_PROVIDER_SITE_OTHER): Payer: BC Managed Care – PPO

## 2023-01-28 VITALS — Ht 66.0 in | Wt 182.0 lb

## 2023-01-28 DIAGNOSIS — R300 Dysuria: Secondary | ICD-10-CM

## 2023-01-28 DIAGNOSIS — N3090 Cystitis, unspecified without hematuria: Secondary | ICD-10-CM | POA: Insufficient documentation

## 2023-01-28 LAB — URINALYSIS, ROUTINE W REFLEX MICROSCOPIC

## 2023-01-28 MED ORDER — FLUCONAZOLE 150 MG PO TABS: 150.0000 mg | ORAL_TABLET | Freq: Every day | ORAL | 0 refills | Status: DC

## 2023-01-28 MED ORDER — ONDANSETRON HCL 4 MG PO TABS: 4.0000 mg | ORAL_TABLET | Freq: Three times a day (TID) | ORAL | 0 refills | Status: AC | PRN

## 2023-01-28 NOTE — Telephone Encounter (Signed)
Spoke with pt and she has been scheduled for a lab and a video visit with Dr. Darrick Huntsman.

## 2023-01-28 NOTE — Assessment & Plan Note (Signed)
Empiric cipro started after urine was collected.  Continue cipro for now

## 2023-01-28 NOTE — Progress Notes (Signed)
Virtual Visit via Caregility   Note   This format is felt to be most appropriate for this patient at this time.  All issues noted in this document were discussed and addressed.  No physical exam was performed (except for noted visual exam findings with Video Visits).   I connected with Gwendolyn [name]  on 01/28/23 at  5:00 PM EDT by a video enabled telemedicine application  and verified that I am speaking with the correct person using two identifiers. Location patient: home Location provider: work or home office Persons participating in the virtual visit: patient, provider  I discussed the limitations, risks, security and privacy concerns of performing an evaluation and management service by telephone and the availability of in person appointments. I also discussed with the patient that there may be a patient responsible charge related to this service. The patient expressed understanding and agreed to proceed.   Reason for visit: DYSURIA and frequency for the past 3 days.    HPI:  52 YR OLD FEMALE presents with dysuria and frequency for 72 hours , acc by mild nausea without flank pain or fevers.   No hematuria.  One sexual partner.    ROS: See pertinent positives and negatives per HPI.  Past Medical History:  Diagnosis Date   Anal fissure    Anxiety    Arthritis    Breast cancer (HCC) 2002   lumpectomy-Right   Depression    Family history of brain cancer    Family history of colon cancer    Fibromyalgia    Gallstones    GERD (gastroesophageal reflux disease)    Irritable bowel syndrome    Motion sickness    cars, boats   Personal history of radiation therapy    Yeast vaginitis     Past Surgical History:  Procedure Laterality Date   ABDOMINAL HYSTERECTOMY     BREAST BIOPSY Right 2002   Breast cancer DCIS   BREAST BIOPSY     BREAST LUMPECTOMY Right 2003   Radiatiomn Therapy DCIS   CHOLECYSTECTOMY  2000   COLECTOMY  10/31/2012   SECONDARY TO COLONIC INERTIA    ESOPHAGOGASTRODUODENOSCOPY (EGD) WITH PROPOFOL N/A 05/11/2020   Procedure: ESOPHAGOGASTRODUODENOSCOPY (EGD) WITH PROPOFOL;  Surgeon: Pasty Spillers, MD;  Location: [facility] ENDOSCOPY;  Service: Endoscopy;  Laterality: N/A;   REDUCTION MAMMAPLASTY Bilateral 11/13/2018   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    Family History  Problem Relation Age of Onset   Meniere's disease Father    Alcohol abuse Sister    Heart disease Maternal Grandmother    Heart disease Maternal Grandfather    Brain cancer Maternal Grandfather    Heart disease Paternal Grandmother    Heart disease Paternal Grandfather    Rectal cancer Other        MGF's mother   Colon cancer Neg Hx    Esophageal cancer Neg Hx    Breast cancer Neg Hx     SOCIAL HX:  reports that she has been smoking cigarettes. She has a 10 pack-year smoking history. She has never used smokeless tobacco. She reports current alcohol use. She reports that she does not use drugs.    Current Outpatient Medications:  amphetamine-dextroamphetamine (ADDERALL) 30 MG tablet, Take 30 mg by mouth 2 (two) times daily. , Disp: , Rfl: 0   APPLE CIDER VINEGAR PO, Take 1,600 mg by mouth daily., Disp: , Rfl:    asenapine (SAPHRIS) 5 MG SUBL 24 hr tablet, 5 mg at bedtime., Disp: , Rfl:    ASHWAGANDHA PO, Take 3,000 mg by mouth at bedtime., Disp: , Rfl:    Cholecalciferol (VITAMIN D) 50 MCG (2000 UT) tablet, Take 2,000 Units by mouth daily., Disp: , Rfl:    CRANBERRY PO, Take 30,000 mg by mouth at bedtime., Disp: , Rfl:    Cyanocobalamin (VITAMIN B-12) 5000 MCG TBDP, Take 5,000 mcg by mouth daily., Disp: , Rfl:    DULoxetine HCl  40 MG CPEP, Take 80 mg by mouth every morning., Disp: , Rfl:    Ferrous Sulfate (IRON PO), Take 25 mg by mouth daily., Disp: , Rfl:    FIBER PO, Take 6 tablets by mouth in the morning and at bedtime., Disp: , Rfl:    fluconazole (DIFLUCAN) 150 MG tablet, Take 1 tablet (150 mg total) by mouth daily., Disp: 2 tablet, Rfl: 0   gabapentin (NEURONTIN) 800 MG tablet, TAKE 1 TABLET (800 MG TOTAL) BY MOUTH 3 (THREE) TIMES DAILY AS NEEDED (NERVE PAIN)., Disp: 180 tablet, Rfl: 0   GINKGO BILOBA PO, Take 3,000 mg by mouth in the morning and at bedtime., Disp: , Rfl:    Melatonin 10 MG TABS, Take 10 mg by mouth daily., Disp: , Rfl:    meloxicam (MOBIC) 15 MG tablet, TAKE 1 TABLET (15 MG TOTAL) BY MOUTH DAILY. AS NEEDED FOR JOINT PAIN, Disp: 30 tablet, Rfl: 2   metFORMIN (GLUCOPHAGE-XR) 500 MG 24 hr tablet, TAKE 1 TABLET BY MOUTH EVERY DAY WITH BREAKFAST, Disp: 90 tablet, Rfl: 1   omeprazole (PRILOSEC) 20 MG capsule, TAKE 1 CAPSULE BY MOUTH EVERY DAY, Disp: 30 capsule, Rfl: 0   ondansetron (ZOFRAN) 4 MG tablet, Take 1 tablet (4 mg total) by mouth every 8 (eight) hours as needed for nausea or vomiting., Disp: 20 tablet, Rfl: 0   Probiotic Product (PROBIOTIC PO), Take 1 capsule by mouth daily., Disp: , Rfl:    tiaGABine (GABITRIL) 4 MG tablet, Take 4-16 mg by mouth See admin instructions. Take 4 mg in the morning and 16 mg at bedtime, Disp: , Rfl:   EXAM:  VITALS per patient if applicable:  GENERAL: alert, oriented, appears well and in no acute distress  HEENT: atraumatic, conjunttiva clear, no obvious abnormalities on inspection of external nose and ears  NECK: normal movements of the head and neck  LUNGS: on inspection no signs of respiratory distress, breathing rate appears normal, no obvious gross SOB, gasping or wheezing  CV: no obvious cyanosis  MS: moves all visible extremities without noticeable abnormality  PSYCH/NEURO: pleasant and cooperative, no obvious depression or anxiety, speech and  thought processing grossly intact  ASSESSMENT AND PLAN: Cystitis Assessment & Plan: Empiric cipro started after urine was collected.  Continue cipro for now    Other orders -     Ondansetron HCl; Take 1 tablet (4 mg total) by mouth every 8 (eight) hours as needed for nausea or vomiting.  Dispense: 20 tablet; Refill: 0 -     Fluconazole; Take 1 tablet (150 mg total) by mouth daily.  Dispense: 2 tablet; Refill: 0      I discussed the assessment and treatment plan with the patient. The patient was provided an opportunity to ask questions and  all were answered. The patient agreed with the plan and demonstrated an understanding of the instructions.   The patient was advised to call back or seek an in-person evaluation if the symptoms worsen or if the condition fails to improve as anticipated.   I spent 30 minutes dedicated to the care of this patient on the date of this encounter to include pre-visit review of his medical history,  Face-to-face time with the patient , and post visit ordering of testing and therapeutics.    Sherlene Shams, MD

## 2023-01-28 NOTE — Telephone Encounter (Signed)
LMTCB

## 2023-01-30 ENCOUNTER — Telehealth: Payer: BC Managed Care – PPO | Admitting: Internal Medicine

## 2023-01-30 LAB — URINE CULTURE
MICRO NUMBER:: 15560819
SPECIMEN QUALITY:: ADEQUATE

## 2023-02-04 ENCOUNTER — Other Ambulatory Visit: Payer: Self-pay | Admitting: Internal Medicine

## 2023-02-04 MED ORDER — CIPROFLOXACIN HCL 250 MG PO TABS: 250.0000 mg | ORAL_TABLET | Freq: Two times a day (BID) | ORAL | 0 refills | Status: AC

## 2023-03-01 ENCOUNTER — Other Ambulatory Visit: Payer: Self-pay | Admitting: Internal Medicine

## 2023-04-15 ENCOUNTER — Ambulatory Visit: Payer: BC Managed Care – PPO | Admitting: Family

## 2023-04-15 ENCOUNTER — Other Ambulatory Visit (HOSPITAL_COMMUNITY)
Admission: RE | Admit: 2023-04-15 | Discharge: 2023-04-15 | Disposition: A | Discharge status: Home / Self Care | Payer: BC Managed Care – PPO | Source: Ambulatory Visit | Attending: Family | Admitting: Family

## 2023-04-15 VITALS — BP 110/70 | HR 82 | Temp 97.8°F | Resp 16 | Ht 66.0 in | Wt 184.0 lb

## 2023-04-15 DIAGNOSIS — B9689 Other specified bacterial agents as the cause of diseases classified elsewhere: Secondary | ICD-10-CM | POA: Diagnosis not present

## 2023-04-15 DIAGNOSIS — N898 Other specified noninflammatory disorders of vagina: Secondary | ICD-10-CM | POA: Diagnosis not present

## 2023-04-15 DIAGNOSIS — N760 Acute vaginitis: Secondary | ICD-10-CM

## 2023-04-15 DIAGNOSIS — Z113 Encounter for screening for infections with a predominantly sexual mode of transmission: Secondary | ICD-10-CM

## 2023-04-15 LAB — POCT URINALYSIS DIPSTICK
Bilirubin, UA: NEGATIVE
Glucose, UA: NEGATIVE
Ketones, UA: NEGATIVE
Leukocytes, UA: NEGATIVE
Nitrite, UA: NEGATIVE
Protein, UA: POSITIVE — AB
Spec Grav, UA: 1.025 (ref 1.010–1.025)
Urobilinogen, UA: 0.2 U/dL
pH, UA: 6 (ref 5.0–8.0)

## 2023-04-15 MED ORDER — METRONIDAZOLE 0.75 % VA GEL: 1.0000 | Freq: Every day | VAGINAL | 0 refills | Status: DC

## 2023-04-15 NOTE — Progress Notes (Signed)
Acute Office Visit  Subjective:     Patient ID: Gwendolyn [name], female    DOB: 1970/08/10, 52 y.o.   MRN: [id number]  Chief Complaint  Patient presents with   Vaginal Discharge   VAGINAL ODOR    HPI Patient is in today with c/o vaginal discharge and odor. Reports the odor smells "salty." Also describes it as mildly itchy. She has had a history of BV in the past. She is sexually active with her husband. Would like to have STI screening performed. She used monistat that did not help.   Review of Systems  Respiratory: Negative.    Cardiovascular: Negative.   Genitourinary:  Negative for dysuria and urgency.       Vaginal discharge and odor.   Musculoskeletal: Negative.   All other systems reviewed and are negative.  Past Medical History:  Diagnosis Date   Anal fissure    Anxiety    Arthritis    Breast cancer (HCC) 2002   lumpectomy-Right   Depression    Family history of brain cancer    Family history of colon cancer    Fibromyalgia    Gallstones    GERD (gastroesophageal reflux disease)    Irritable bowel syndrome    Motion sickness    cars, boats   Personal history of radiation therapy    Yeast vaginitis     Social History   Socioeconomic History   Marital status: Married    Spouse name: Not on file   Number of children: 2   Years of education: Not on file   Highest education level: Not on file  Occupational History   Occupation: Teacher  Tobacco Use   Smoking status: Light Smoker    Current packs/day: 0.50    Average packs/day: 0.5 packs/day for 20.0 years (10.0 ttl pk-yrs)    Types: Cigarettes   Smokeless tobacco: Never   Tobacco comments:    smoked 10 yrs, quit 20 yrs, smoked last 4 yrs  Vaping Use   Vaping status: Never Used  Substance and Sexual Activity   Alcohol use: Yes    Alcohol/week: 0.0 standard drinks of alcohol    Comment: OCCASIONALLY   Drug use: No   Sexual activity: Yes    Birth control/protection: Surgical  Other  Topics Concern   Not on file  Social History Narrative   Married from [employer]    Social Drivers of Health   Financial Resource Strain: Not on file  Food Insecurity: Not on file  Transportation Needs: No Transportation Needs (12/26/2018)   PRAPARE - Administrator, Civil Service (Medical): No    Lack of Transportation (Non-Medical): No  Physical Activity: Not on file  Stress: Not on file  Social Connections: Not on file  Intimate Partner Violence: Not At Risk (12/26/2018)   Humiliation, Afraid, Rape, and Kick questionnaire    Fear of Current or Ex-Partner: No    Emotionally Abused: No    Physically Abused: No    Sexually Abused: No    Past Surgical History:  Procedure Laterality Date   ABDOMINAL HYSTERECTOMY     BREAST BIOPSY Right 2002   Breast cancer DCIS   BREAST BIOPSY     BREAST LUMPECTOMY Right 2003   Radiatiomn Therapy DCIS   CHOLECYSTECTOMY  2000   COLECTOMY  10/31/2012   SECONDARY TO COLONIC INERTIA   ESOPHAGOGASTRODUODENOSCOPY (EGD) WITH PROPOFOL N/A 05/11/2020   Procedure: ESOPHAGOGASTRODUODENOSCOPY (EGD) WITH PROPOFOL;  Surgeon: Pasty Spillers, MD;  Location:  [facility] ENDOSCOPY;  Service: Endoscopy;  Laterality: N/A;   REDUCTION MAMMAPLASTY Bilateral 11/13/2018   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    Family History  Problem Relation Age of Onset   Meniere's disease Father    Alcohol abuse Sister    Heart disease Maternal Grandmother    Heart disease Maternal Grandfather    Brain cancer Maternal Grandfather    Heart disease Paternal Grandmother    Heart disease Paternal Grandfather    Rectal cancer Other         MGF's mother   Colon cancer Neg Hx    Esophageal cancer Neg Hx    Breast cancer Neg Hx     Allergies  Allergen Reactions   Amoxicillin-Pot Clavulanate Nausea Only   Morphine Diarrhea and Nausea Only   Penicillins Diarrhea and Nausea Only    Current Outpatient Medications on File Prior to Visit  Medication Sig Dispense Refill   amphetamine-dextroamphetamine (ADDERALL) 30 MG tablet Take 30 mg by mouth 2 (two) times daily.   0   APPLE CIDER VINEGAR PO Take 1,600 mg by mouth daily.     asenapine (SAPHRIS) 5 MG SUBL 24 hr tablet 5 mg at bedtime.     ASHWAGANDHA PO Take 3,000 mg by mouth at bedtime.     Cholecalciferol (VITAMIN D) 50 MCG (2000 UT) tablet Take 2,000 Units by mouth daily.     CRANBERRY PO Take 30,000 mg by mouth at bedtime.     Cyanocobalamin (VITAMIN B-12) 5000 MCG TBDP Take 5,000 mcg by mouth daily.     DULoxetine HCl 40 MG CPEP Take 80 mg by mouth every morning.     Ferrous Sulfate (IRON PO) Take 25 mg by mouth daily.     FIBER PO Take 6 tablets by mouth in the morning and at bedtime.     fluconazole (DIFLUCAN) 150 MG tablet Take 1 tablet (150 mg total) by mouth daily. 2 tablet 0   GINKGO BILOBA PO Take 3,000 mg by mouth in the morning and at bedtime.     Melatonin 10 MG TABS Take 10 mg by mouth daily.     meloxicam (MOBIC) 15 MG tablet TAKE 1 TABLET (15 MG TOTAL) BY MOUTH DAILY. AS NEEDED FOR JOINT PAIN 30 tablet 2   metFORMIN (GLUCOPHAGE-XR) 500 MG 24 hr tablet TAKE 1 TABLET BY MOUTH EVERY DAY WITH BREAKFAST 90 tablet 1   omeprazole (PRILOSEC) 20 MG capsule TAKE 1 CAPSULE BY MOUTH EVERY DAY 30 capsule 0   ondansetron (ZOFRAN) 4 MG tablet Take 1 tablet (4 mg total) by mouth every 8 (eight) hours as needed for nausea or vomiting. 20 tablet 0   Probiotic Product (PROBIOTIC PO) Take 1 capsule by mouth daily.     tiaGABine (GABITRIL) 4 MG tablet Take 4-16 mg by mouth See admin instructions. Take 4 mg in the morning and 16 mg at bedtime     No current facility-administered  medications on file prior to visit.    BP 110/70   Pulse 82   Temp 97.8 F (36.6 C)   Resp 16   Ht 5\' 6"  (1.676 m)   Wt 184 lb (83.5 kg)   SpO2 99%  BMI 29.70 kg/m chart      Objective:    BP 110/70   Pulse 82   Temp 97.8 F (36.6 C)   Resp 16   Ht 5\' 6"  (1.676 m)   Wt 184 lb (83.5 kg)   SpO2 99%   BMI 29.70 kg/m    Physical Exam Vitals reviewed.  Constitutional:      Appearance: Normal appearance.  Cardiovascular:     Rate and Rhythm: Normal rate and regular rhythm.  Pulmonary:     Effort: Pulmonary effort is normal.     Breath sounds: Normal breath sounds.  Genitourinary:    Comments: Self- swab obtained by patient.  Musculoskeletal:        General: Normal range of motion.  Skin:    General: Skin is warm and dry.  Neurological:     General: No focal deficit present.     Mental Status: She is alert and oriented to person, place, and time.  Psychiatric:        Mood and Affect: Mood normal.        Behavior: Behavior normal.     No results found for any visits on 04/15/23.      Assessment & Plan:   Problem List Items Addressed This Visit   None Visit Diagnoses       Vaginal discharge    -  Primary   Relevant Orders   Wet prep, genital   Cervicovaginal ancillary only( [place])   POC Urinalysis Dipstick     Screen for sexually transmitted diseases       Relevant Orders   HIV antibody (with reflex)   RPR     Bacterial vaginosis           Meds ordered this encounter  Medications   metroNIDAZOLE (METROGEL) 0.75 % vaginal gel    Sig: Place 1 Applicatorful vaginally at bedtime.    Dispense:  70 g    Refill:  0   Call the office with any questions or concerns. Recheck as scheduled and sooner as needed.  No follow-ups on file.  Eulis Foster, FNP

## 2023-04-16 LAB — HIV ANTIBODY (ROUTINE TESTING W REFLEX): HIV 1&2 Ab, 4th Generation: NONREACTIVE

## 2023-04-16 LAB — CERVICOVAGINAL ANCILLARY ONLY
Chlamydia: NEGATIVE
Comment: NEGATIVE
Comment: NEGATIVE
Comment: NORMAL
Neisseria Gonorrhea: NEGATIVE
Trichomonas: NEGATIVE

## 2023-04-16 LAB — RPR: RPR Ser Ql: NONREACTIVE

## 2023-04-23 ENCOUNTER — Other Ambulatory Visit: Payer: Self-pay

## 2023-04-23 MED ORDER — METRONIDAZOLE 0.75 % VA GEL: 1.0000 | Freq: Every day | VAGINAL | 0 refills | Status: DC

## 2023-04-23 NOTE — Telephone Encounter (Signed)
 Rx for metrogel sent in. Ok per Dr Darrick Huntsman

## 2023-05-13 ENCOUNTER — Other Ambulatory Visit: Payer: Self-pay | Admitting: Family Medicine

## 2023-05-13 DIAGNOSIS — M797 Fibromyalgia: Secondary | ICD-10-CM

## 2023-05-13 NOTE — Telephone Encounter (Signed)
Pt has not been seen since 2022. Refill not appropriate.

## 2023-06-20 ENCOUNTER — Other Ambulatory Visit: Payer: Self-pay | Admitting: Internal Medicine

## 2023-07-29 ENCOUNTER — Encounter: Payer: Self-pay | Admitting: Family

## 2023-07-29 ENCOUNTER — Ambulatory Visit: Admitting: Family

## 2023-07-29 VITALS — BP 130/76 | HR 94 | Temp 98.6°F | Ht 66.0 in | Wt 174.6 lb

## 2023-07-29 DIAGNOSIS — N390 Urinary tract infection, site not specified: Secondary | ICD-10-CM

## 2023-07-29 DIAGNOSIS — R300 Dysuria: Secondary | ICD-10-CM

## 2023-07-29 LAB — POCT URINALYSIS DIPSTICK
Bilirubin, UA: NEGATIVE
Glucose, UA: POSITIVE — AB
Ketones, UA: NEGATIVE
Nitrite, UA: POSITIVE
Protein, UA: POSITIVE — AB
Spec Grav, UA: <=1.005 — AB (ref 1.010–1.025)
Urobilinogen, UA: 1 U/dL
pH, UA: 5 (ref 5.0–8.0)

## 2023-07-29 MED ORDER — CIPROFLOXACIN HCL 250 MG PO TABS: 250.0000 mg | ORAL_TABLET | Freq: Two times a day (BID) | ORAL | 0 refills | Status: DC

## 2023-07-29 MED ORDER — NITROFURANTOIN MONOHYD MACRO 100 MG PO CAPS: ORAL_CAPSULE | ORAL | 1 refills | Status: DC

## 2023-07-29 NOTE — Addendum Note (Signed)
 Addended by: Warden Fillers on: 07/29/2023 02:44 PM   Modules accepted: Orders

## 2023-07-29 NOTE — Progress Notes (Signed)
 Assessment & Plan:  Dysuria -     POCT urinalysis dipstick -     Urinalysis, Routine w reflex microscopic -     Urine Culture; Future -     Ciprofloxacin HCl; Take 1 tablet (250 mg total) by mouth 2 (two) times daily for 5 days.  Dispense: 10 tablet; Refill: 0 -     Nitrofurantoin Monohyd Macro; Take one tablet 2 hours after intercourse as single dose.  Dispense: 30 capsule; Refill: 1  Postcoital UTI Assessment & Plan: Afebrile.  Point-of-care with positive glucose, nitrite, leukocytes, RBCs. She requests 5 day course of ciprofloxacin versus 3 days.  Counseled on blackbox warning as it relates to irreversible tendon rupture, C. difficile.  Patient is very aware of the side effects.  She states it is really the only antibiotic that works for her.  I have provided.  We also discussed d-mannose, cranberry supplements.  Provided Macrobid 100 mg to use after intercourse.      Return precautions given.   Risks, benefits, and alternatives of the medications and treatment plan prescribed today were discussed, and patient expressed understanding.   Education regarding symptom management and diagnosis given to patient on AVS either electronically or printed.  No follow-ups on file.  Rennie Plowman, FNP  Subjective:    Patient ID: Gwendolyn [name], female    DOB: Sep 01, 1970, 53 y.o.   MRN: [id number]  CC: Gwendolyn [name] is a 53 y.o. female who presents today for an acute visit.    HPI: Complains of dysuria, dribbling x 1 day  Denies flank pain, nausea, fever.  She gets approximately 2 UTIs per year which usually happen after intercourse She states ciprofloxacin is the only antibiotic that works for her.  She usually receives 5-day course.     No history of C. difficile.  History of colectomy  No h/o ckd    Compliant with cranberry.  Urine culture October 2024 with e. coli.  Treated with ciprofloxacin  History of right breast cancer Allergies: Amoxicillin-pot  clavulanate, Morphine, and Penicillins Current Outpatient Medications on File Prior to Visit  Medication Sig Dispense Refill   amphetamine-dextroamphetamine (ADDERALL) 30 MG tablet Take 30 mg by mouth 2 (two) times daily.   0   APPLE CIDER VINEGAR PO Take 1,600 mg by mouth daily.     asenapine (SAPHRIS) 5 MG SUBL 24 hr tablet 5 mg at bedtime.     ASHWAGANDHA PO Take 3,000 mg by mouth at bedtime.     Cholecalciferol (VITAMIN D) 50 MCG (2000 UT) tablet Take 2,000 Units by mouth daily.     CRANBERRY PO Take 30,000 mg by mouth at bedtime.     Cyanocobalamin (VITAMIN B-12) 5000 MCG TBDP Take 5,000 mcg by mouth daily.     DULoxetine HCl 40 MG CPEP Take 80 mg by mouth every morning.     Ferrous Sulfate (IRON PO) Take 25 mg by mouth daily.     FIBER PO Take 6 tablets by mouth in the morning and at bedtime.     fluconazole (DIFLUCAN) 150 MG tablet Take 1 tablet (150 mg total) by mouth daily. 2 tablet 0   GINKGO BILOBA PO Take 3,000 mg by mouth in the morning and at bedtime.     Melatonin 10 MG TABS Take 10 mg by mouth daily.     meloxicam (MOBIC) 15 MG tablet TAKE 1 TABLET (15 MG TOTAL) BY MOUTH DAILY. AS NEEDED FOR JOINT PAIN 30 tablet 2   metFORMIN (GLUCOPHAGE-XR) 500  MG 24 hr tablet TAKE 1 TABLET BY MOUTH EVERY DAY WITH BREAKFAST 90 tablet 1   metroNIDAZOLE (METROGEL) 0.75 % vaginal gel Place 1 Applicatorful vaginally at bedtime. 70 g 0   omeprazole (PRILOSEC) 20 MG capsule TAKE 1 CAPSULE BY MOUTH EVERY DAY 30 capsule 0   ondansetron (ZOFRAN) 4 MG tablet Take 1 tablet (4 mg total) by mouth every 8 (eight) hours as needed for nausea or vomiting. 20 tablet 0   Probiotic Product (PROBIOTIC PO) Take 1 capsule by mouth daily.     tiaGABine (GABITRIL) 4 MG tablet Take 4-16 mg by mouth See admin instructions. Take 4 mg in the morning and 16 mg at bedtime     No current facility-administered medications on file prior to visit.    Review of Systems  Constitutional:  Negative for chills and fever.   Respiratory:  Negative for cough.   Cardiovascular:  Negative for chest pain and palpitations.  Gastrointestinal:  Negative for nausea and vomiting.  Genitourinary:  Positive for dysuria. Negative for hematuria, vaginal bleeding and vaginal discharge.      Objective:    BP 130/76   Pulse 94   Temp 98.6 F (37 C) (Oral)   Ht 5\' 6"  (1.676 m)   Wt 174 lb 9.6 oz (79.2 kg)   SpO2 98%   BMI 28.18 kg/m   BP Readings from Last 3 Encounters:  07/29/23 130/76  04/15/23 110/70  01/15/23 108/62   Wt Readings from Last 3 Encounters:  07/29/23 174 lb 9.6 oz (79.2 kg)  04/15/23 184 lb (83.5 kg)  01/28/23 182 lb (82.6 kg)    Physical Exam Vitals reviewed.  Constitutional:      Appearance: She is well-developed.  Cardiovascular:     Rate and Rhythm: Normal rate and regular rhythm.     Pulses: Normal pulses.     Heart sounds: Normal heart sounds.  Pulmonary:     Effort: Pulmonary effort is normal.     Breath sounds: Normal breath sounds. No wheezing, rhonchi or rales.  Abdominal:     Tenderness: There is no right CVA tenderness or left CVA tenderness.  Skin:    General: Skin is warm and dry.  Neurological:     Mental Status: She is alert.  Psychiatric:        Speech: Speech normal.        Behavior: Behavior normal.        Thought Content: Thought content normal.

## 2023-07-29 NOTE — Patient Instructions (Signed)
 Consider over-the-counter d-mannose combined with cranberry to prevent recurrence of UTI.     I provided Macrobid 100 mg taken 1 dose 2 hours after intercourse.  You may also start ciprofloxacin as discussed.  We discussed risk of reversible tendon rupture.  Please stop immediately if you experience leg pain.    It is imperative to continue probiotics while on this medication as we discussed

## 2023-07-29 NOTE — Assessment & Plan Note (Addendum)
 Afebrile.  Point-of-care with positive glucose, nitrite, leukocytes, RBCs. She requests 5 day course of ciprofloxacin versus 3 days.  Counseled on blackbox warning as it relates to irreversible tendon rupture, C. difficile.  Patient is very aware of the side effects.  She states it is really the only antibiotic that works for her.  I have provided.  We also discussed d-mannose, cranberry supplements.  Provided Macrobid 100 mg to use after intercourse.

## 2023-07-31 ENCOUNTER — Other Ambulatory Visit: Payer: Self-pay | Admitting: Family

## 2023-07-31 ENCOUNTER — Encounter: Payer: Self-pay | Admitting: Family

## 2023-07-31 DIAGNOSIS — N390 Urinary tract infection, site not specified: Secondary | ICD-10-CM

## 2023-07-31 LAB — URINE CULTURE
MICRO NUMBER:: 16297303
SPECIMEN QUALITY:: ADEQUATE

## 2023-07-31 LAB — URINALYSIS, ROUTINE W REFLEX MICROSCOPIC
Bilirubin Urine: NEGATIVE
Glucose, UA: NEGATIVE
Hyaline Cast: NONE SEEN /LPF
Ketones, ur: NEGATIVE
Nitrite: POSITIVE — AB
Protein, ur: NEGATIVE
Specific Gravity, Urine: 1.011 (ref 1.001–1.035)
WBC, UA: >=60 /HPF — AB (ref 0–5)
pH: <=5 (ref 5.0–8.0)

## 2023-07-31 LAB — MICROSCOPIC MESSAGE

## 2023-07-31 MED ORDER — NITROFURANTOIN MONOHYD MACRO 100 MG PO CAPS: 100.0000 mg | ORAL_CAPSULE | Freq: Two times a day (BID) | ORAL | 0 refills | Status: DC

## 2023-08-01 ENCOUNTER — Encounter: Payer: Self-pay | Admitting: Family

## 2023-08-06 ENCOUNTER — Other Ambulatory Visit: Payer: Self-pay | Admitting: Internal Medicine

## 2023-08-07 ENCOUNTER — Ambulatory Visit: Payer: Self-pay

## 2023-08-07 ENCOUNTER — Ambulatory Visit: Admitting: Family Medicine

## 2023-08-07 ENCOUNTER — Other Ambulatory Visit: Payer: Self-pay | Admitting: Internal Medicine

## 2023-08-07 MED ORDER — CEFDINIR 300 MG PO CAPS
300.0000 mg | ORAL_CAPSULE | Freq: Two times a day (BID) | ORAL | 0 refills | Status: DC
Start: 2023-08-07 — End: 2023-08-19

## 2023-08-07 NOTE — Telephone Encounter (Signed)
 Chief Complaint: Pain with urination  Symptoms: Pain with urination, increased frequency  Frequency: Constant  Pertinent Negatives: Patient denies foul urine odor, flank pain, back pain  Disposition: [] ED /[] Urgent Care (no appt availability in office) / [x] Appointment(In office/virtual)/ []  [facility]/ [] Home Care/ [] Refused Recommended Disposition /[] [facility]/ []  Follow-up with PCP Additional Notes: Patient states she was treat for a UTI on 07/29/23 with Cipro, patient reported having leg pain on 07/31/23 and the Cipro was discontinued and was advised to start taking Macrobid for 5 days. Patient states she completed the course of Macrobid but feels like Macrobid does nothing for her. Patient states the symptoms are the same since completing the Macrobid Rx. Care advice was given and patient was scheduled for an appointment this afternoon at 1440. Patient is requesting PCP send antibiotics to her pharmacy so she does not have to come to the appointment since this is a reoccurring issue. Advised I would forward request to PCP for further recommendation but advised to keep scheduled appointment unless advised by the staff it has been canceled. Patient verbalized understanding.   Copied From CRM [id number]. Reason for Triage: was seen a week ago,placed on antibiotics for UTI,not better,still having pain while urination,frequency  Reason for Disposition  [1] Taking antibiotic > 72 hours (3 days) for UTI AND [2] painful urination or frequency is SAME (unchanged, not better)  Answer Assessment - Initial Assessment Questions 1. MAIN SYMPTOM: "What is the main symptom you are concerned about?" (e.g., painful urination, urine frequency)     Painful urination  2. BETTER-SAME-WORSE: "Are you getting better, staying the same, or getting worse compared to how you felt at your last visit to the doctor (most recent medical visit)?"     Same  3. PAIN: "How bad is the pain?"  (e.g., Scale 1-10;  mild, moderate, or severe)   - MILD (1-3): complains slightly about urination hurting   - MODERATE (4-7): interferes with normal activities     - SEVERE (8-10): excruciating, unwilling or unable to urinate because of the pain      7/10 4. FEVER: "Do you have a fever?" If Yes, ask: "What is it, how was it measured, and when did it start?"     No  5. OTHER SYMPTOMS: "Do you have any other symptoms?" (e.g., blood in the urine, flank pain, vaginal discharge)     Increased frequency  6. DIAGNOSIS: "When was the UTI diagnosed?" "By whom?" "Was it a kidney infection, bladder infection or both?"     07/29/23 7. ANTIBIOTIC: "What antibiotic(s) are you taking?" "How many times per day?"     Ciprofloxacin HCl; Take 1 tablet (250 mg total) by mouth 2 (two) times daily for discontinued after 2 days and was switched to Macrobid  8. ANTIBIOTIC - START DATE: "When did you start taking the antibiotic?"     07/29/22  Protocols used: Urinary Tract Infection on Antibiotic Follow-up Call - [facility]

## 2023-08-07 NOTE — Telephone Encounter (Signed)
 Pt is aware.

## 2023-08-19 ENCOUNTER — Encounter: Payer: Self-pay | Admitting: Family Medicine

## 2023-08-19 ENCOUNTER — Other Ambulatory Visit (HOSPITAL_COMMUNITY)
Admission: RE | Admit: 2023-08-19 | Discharge: 2023-08-19 | Disposition: A | Discharge status: Home / Self Care | Source: Ambulatory Visit | Attending: Family Medicine | Admitting: Family Medicine

## 2023-08-19 ENCOUNTER — Ambulatory Visit: Admitting: Family Medicine

## 2023-08-19 ENCOUNTER — Encounter: Payer: Self-pay | Admitting: Family

## 2023-08-19 VITALS — BP 108/70 | HR 93 | Temp 98.7°F | Resp 20 | Ht 66.0 in | Wt 171.2 lb

## 2023-08-19 DIAGNOSIS — Z113 Encounter for screening for infections with a predominantly sexual mode of transmission: Secondary | ICD-10-CM | POA: Insufficient documentation

## 2023-08-19 DIAGNOSIS — B379 Candidiasis, unspecified: Secondary | ICD-10-CM | POA: Diagnosis not present

## 2023-08-19 DIAGNOSIS — N949 Unspecified condition associated with female genital organs and menstrual cycle: Secondary | ICD-10-CM | POA: Insufficient documentation

## 2023-08-19 DIAGNOSIS — N390 Urinary tract infection, site not specified: Secondary | ICD-10-CM | POA: Diagnosis not present

## 2023-08-19 DIAGNOSIS — B3731 Acute candidiasis of vulva and vagina: Secondary | ICD-10-CM | POA: Insufficient documentation

## 2023-08-19 LAB — POCT URINALYSIS DIP (CLINITEK)
Bilirubin, UA: NEGATIVE
Blood, UA: NEGATIVE
Glucose, UA: NEGATIVE mg/dL
Leukocytes, UA: NEGATIVE
Nitrite, UA: NEGATIVE
Spec Grav, UA: >=1.03 — AB (ref 1.010–1.025)
Urobilinogen, UA: 0.2 U/dL
pH, UA: 5.5 (ref 5.0–8.0)

## 2023-08-19 NOTE — Patient Instructions (Addendum)
 It was a pleasure meeting you today. Thank you for allowing me to take part in your health care.  Our goals for today as we discussed include:  Will MyChart you the results of swabs  today.  If needing treatment will send in prescription to your pharmacy.   To prevent recurrent bacterial vaginosis, I recommend starting a women's probiotic.  You can find these at [employer], [employer], the pharmacy, and [employer].  They are usually in pink bottles.  There are some examples below.  Taking these daily will not completely prevent bacterial vaginosis, but can greatly reduce the recurrence.       Urine is negative for infection. Increase water  intake Follow up as scheduled  This is a list of the screening recommended for you and due dates:  Health Maintenance  Topic Date Due   Pneumococcal Vaccination (1 of 2 - PCV) Never done   COVID-19 Vaccine (7 - Pfizer risk 2024-25 season) 07/01/2023   Mammogram  11/12/2023   Flu Shot  11/22/2023   Pap with HPV screening  01/15/2028   DTaP/Tdap/Td vaccine (4 - Td or Tdap) 12/31/2032   Hepatitis C Screening  Completed   HIV Screening  Completed   Zoster (Shingles) Vaccine  Completed   HPV Vaccine  Aged Out   Meningitis B Vaccine  Aged Out    If you have any questions or concerns, please do not hesitate to call the office at [phone].  I look forward to our next visit and until then take care and stay safe.  Regards,   Valli Gaw, MD   [facility]

## 2023-08-19 NOTE — Progress Notes (Signed)
 SUBJECTIVE:   Chief Complaint  Patient presents with   Vaginal Pain    Inside pain since this am   HPI Presents for acute visit  Discussed the use of AI scribe software for clinical note transcription with the patient, who gave verbal consent to proceed.  History of Present Illness Gwendolyn [name] is a 53 year old female who presents with vaginal pain and irritation.  She began experiencing vaginal pain and irritation this morning, particularly when wiping after urination. The pain is localized around the pubic bone area and was more severe in the morning than at present. No lesions, itching, or scaling have been observed.  Her sex partner recently reported having bumps in his pubic hair, which prompted her to seek medical evaluation. She has not engaged in sexual intercourse for several days and denies any vaginal discharge or unusual odor.  She has a history of urinary tract infections and was previously treated with antibiotics, including Metrogel  and Macrobid , which she has completed. She was scheduled for a follow-up appointment to recheck after a urinary tract infection three weeks ago, but she is not currently experiencing any urinary symptoms.    PERTINENT PMH / PSH: As above  OBJECTIVE:  BP 108/70   Pulse 93   Temp 98.7 F (37.1 C)   Resp 20   Ht 5\' 6"  (1.676 m)   Wt 171 lb 4 oz (77.7 kg)   SpO2 98%   BMI 27.64 kg/m    Physical Exam Vitals reviewed. Exam conducted with a chaperone present.  Constitutional:      General: She is not in acute distress.    Appearance: Normal appearance. She is normal weight. She is not ill-appearing, toxic-appearing or diaphoretic.  Eyes:     General:        Right eye: No discharge.        Left eye: No discharge.     Conjunctiva/sclera: Conjunctivae normal.  Cardiovascular:     Rate and Rhythm: Normal rate.  Pulmonary:     Effort: Pulmonary effort is normal.  Genitourinary:    General: Normal vulva.     Exam  position: Lithotomy position.     Labia:        Right: Tenderness present. No rash, lesion or injury.        Left: Tenderness present. No rash, lesion or injury.      Urethra: No prolapse or urethral pain.     Vagina: Vaginal discharge and erythema present. No bleeding or lesions.     Cervix: Discharge present. No cervical motion tenderness, friability, lesion, erythema or cervical bleeding.     Adnexa: Right adnexa normal and left adnexa normal.  Skin:    General: Skin is warm and dry.  Neurological:     General: No focal deficit present.     Mental Status: She is alert and oriented to person, place, and time. Mental status is at baseline.  Psychiatric:        Mood and Affect: Mood normal.        Behavior: Behavior normal.        Thought Content: Thought content normal.        Judgment: Judgment normal.           08/19/2023    1:48 PM 07/29/2023   12:47 PM 01/15/2023    1:35 PM 01/08/2022   10:16 AM 11/24/2021   11:34 AM  Depression screen PHQ 2/9  Decreased Interest 0 0 0 0 0  Down, Depressed, Hopeless 0 0 0 0 0  PHQ - 2 Score 0 0 0 0 0  Altered sleeping 1      Tired, decreased energy 0      Change in appetite 0      Feeling bad or failure about yourself  0      Trouble concentrating 1      Moving slowly or fidgety/restless 0      Suicidal thoughts 0      PHQ-9 Score 2      Difficult doing work/chores Not difficult at all          08/19/2023    1:48 PM  GAD 7 : Generalized Anxiety Score  Nervous, Anxious, on Edge 1  Control/stop worrying 0  Worry too much - different things 0  Trouble relaxing 1  Restless 0  Easily annoyed or irritable 0  Afraid - awful might happen 1  Total GAD 7 Score 3  Anxiety Difficulty Somewhat difficult    ASSESSMENT/PLAN:  Postcoital UTI Assessment & Plan: Requesting follow up urine. Recent UTI treated with antibiotics. No current symptoms. - Perform urinalysis today.  Orders: -     POCT URINALYSIS DIP ([facility])  Vaginal  discomfort Assessment & Plan: Acute vaginal pain and irritation post-micturition. No discharge or odor. Differential includes bacterial vaginosis and possible STI. - Conduct STI swabs .   Orders: -     Cervicovaginal ancillary only  Candida infection -     Fluconazole ; Take 1 tablet (150 mg total) by mouth daily.  Dispense: 1 tablet; Refill: 0     PDMP reviewed  Return if symptoms worsen or fail to improve, for PCP.  Valli Gaw, MD

## 2023-08-20 ENCOUNTER — Ambulatory Visit: Admitting: Family

## 2023-08-20 NOTE — Telephone Encounter (Signed)
 Spoke to pt she had urine sample done on 4/28 so therefore she asked me to cancel her appt on 4/29

## 2023-08-21 LAB — CERVICOVAGINAL ANCILLARY ONLY
Bacterial Vaginitis (gardnerella): NEGATIVE
Candida Glabrata: NEGATIVE
Candida Vaginitis: POSITIVE — AB
Chlamydia: NEGATIVE
Comment: NEGATIVE
Comment: NEGATIVE
Comment: NEGATIVE
Comment: NEGATIVE
Comment: NEGATIVE
Comment: NORMAL
Neisseria Gonorrhea: NEGATIVE
Trichomonas: NEGATIVE

## 2023-08-25 ENCOUNTER — Encounter: Payer: Self-pay | Admitting: Family Medicine

## 2023-08-25 DIAGNOSIS — N949 Unspecified condition associated with female genital organs and menstrual cycle: Secondary | ICD-10-CM | POA: Insufficient documentation

## 2023-08-25 MED ORDER — FLUCONAZOLE 150 MG PO TABS: 150.0000 mg | ORAL_TABLET | Freq: Every day | ORAL | 0 refills | Status: DC

## 2023-08-25 NOTE — Assessment & Plan Note (Addendum)
 Requesting follow up urine. Recent UTI treated with antibiotics. No current symptoms. - Perform urinalysis today.

## 2023-08-25 NOTE — Assessment & Plan Note (Signed)
 Acute vaginal pain and irritation post-micturition. No discharge or odor. Differential includes bacterial vaginosis and possible STI. - Conduct STI swabs .

## 2023-09-02 ENCOUNTER — Ambulatory Visit: Payer: Self-pay

## 2023-09-02 ENCOUNTER — Other Ambulatory Visit (HOSPITAL_COMMUNITY)
Admission: RE | Admit: 2023-09-02 | Discharge: 2023-09-02 | Disposition: A | Discharge status: Home / Self Care | Source: Ambulatory Visit | Attending: Family Medicine | Admitting: Family Medicine

## 2023-09-02 ENCOUNTER — Encounter: Payer: Self-pay | Admitting: Family Medicine

## 2023-09-02 ENCOUNTER — Ambulatory Visit: Admitting: Family Medicine

## 2023-09-02 VITALS — BP 114/68 | HR 79 | Temp 98.0°F | Resp 20 | Ht 66.0 in | Wt 175.2 lb

## 2023-09-02 DIAGNOSIS — N390 Urinary tract infection, site not specified: Secondary | ICD-10-CM

## 2023-09-02 DIAGNOSIS — N898 Other specified noninflammatory disorders of vagina: Secondary | ICD-10-CM | POA: Diagnosis present

## 2023-09-02 DIAGNOSIS — N949 Unspecified condition associated with female genital organs and menstrual cycle: Secondary | ICD-10-CM | POA: Diagnosis not present

## 2023-09-02 DIAGNOSIS — R8290 Unspecified abnormal findings in urine: Secondary | ICD-10-CM | POA: Diagnosis not present

## 2023-09-02 LAB — URINALYSIS, ROUTINE W REFLEX MICROSCOPIC
Bilirubin Urine: NEGATIVE
Ketones, ur: NEGATIVE
Nitrite: POSITIVE — AB
Specific Gravity, Urine: 1.025 (ref 1.000–1.030)
Total Protein, Urine: 100 — AB
Urine Glucose: 100 — AB
Urobilinogen, UA: 1 (ref 0.0–1.0)
pH: 6 (ref 5.0–8.0)

## 2023-09-02 LAB — POC URINALSYSI DIPSTICK (AUTOMATED)
Bilirubin, UA: NEGATIVE
Glucose, UA: POSITIVE — AB
Nitrite, UA: POSITIVE
Protein, UA: POSITIVE — AB
Spec Grav, UA: 1.025 (ref 1.010–1.025)
Urobilinogen, UA: 0.2 U/dL
pH, UA: 5.5 (ref 5.0–8.0)

## 2023-09-02 MED ORDER — CEFDINIR 300 MG PO CAPS: 300.0000 mg | ORAL_CAPSULE | Freq: Two times a day (BID) | ORAL | 0 refills | Status: AC

## 2023-09-02 NOTE — Patient Instructions (Addendum)
 It was a pleasure meeting you today. Thank you for allowing me to take part in your health care.  Our goals for today as we discussed include:  Imaging ordered today of kidney and abdomen.  Start Cefdinir  1 tablet two times a day Probiotics daily Repeat urine 4 weeks after completion of antibiotics Please schedule lab appointment for urine sample   Recommend Urology referral pending results of CT  Will notify of swab results and if needing treatment    This is a list of the screening recommended for you and due dates:  Health Maintenance  Topic Date Due   Pneumococcal Vaccination (1 of 2 - PCV) Never done   COVID-19 Vaccine (7 - Pfizer risk 2024-25 season) 07/01/2023   Mammogram  11/12/2023   Flu Shot  11/22/2023   Pap with HPV screening  01/15/2028   DTaP/Tdap/Td vaccine (4 - Td or Tdap) 12/31/2032   Hepatitis C Screening  Completed   HIV Screening  Completed   Zoster (Shingles) Vaccine  Completed   HPV Vaccine  Aged Out   Meningitis B Vaccine  Aged Out      If you have any questions or concerns, please do not hesitate to call the office at [phone].  I look forward to our next visit and until then take care and stay safe.  Regards,   Valli Gaw, MD   [facility]

## 2023-09-02 NOTE — Telephone Encounter (Signed)
 Copied from CRM [id number]. Topic: Clinical - Red Word Triage >> Sep 02, 2023  8:15 AM Emylou G wrote: [facility] that prompted transfer to Nurse Triage: UTI? symptons: burning sensation.. sense of urgency   Chief Complaint: Urinary symptoms Symptoms: burning with urination, urgency Frequency: x 2 days Pertinent Negatives: Patient denies fever, flank pain Disposition: [] ED /[] Urgent Care (no appt availability in office) / [x] Appointment(In office/virtual)/ []  [facility]/ [] Home Care/ [] Refused Recommended Disposition /[] [facility]/ []  Follow-up with PCP Additional Notes: Patient called and advised that she has been having urinary symptoms for the past 2 days--burning with urination and urgency.  Patient denies fever, flank pain.  Appointment was already made for today 09/02/2023 at 10:40 am with Valli Gaw prior to Triage.  Patient states she made the appointment, the call was lost before finishing that conversation so she called back.  The appointment is still on the schedule so patient is advised of this appointment still being here.  Patient is given Care Advice as per protocol and patient is also advised that if anything gets worse to go to the Emergency Room. Patient verbalized understanding.   Reason for Disposition  All other urine symptoms  Answer Assessment - Initial Assessment Questions 1. SYMPTOM: "What's the main symptom you're concerned about?" (e.g., frequency, incontinence)     Urgency, burning with urination 2. ONSET: "When did the  symptoms  start?"     2 days 3. PAIN: "Is there any pain?" If Yes, ask: "How bad is it?" (Scale: 1-10; mild, moderate, severe)     5 4. CAUSE: "What do you think is causing the symptoms?"     Possible UTI 5. OTHER SYMPTOMS: "Do you have any other symptoms?" (e.g., blood in urine, fever, flank pain, pain with urination)     Denies 6. PREGNANCY: "Is there any chance you are pregnant?" "When was your last menstrual  period?"     No  Protocols used: Urinary Symptoms-A-AH

## 2023-09-03 ENCOUNTER — Encounter: Payer: Self-pay | Admitting: Family Medicine

## 2023-09-03 ENCOUNTER — Ambulatory Visit: Payer: Self-pay | Admitting: Family Medicine

## 2023-09-03 DIAGNOSIS — N3001 Acute cystitis with hematuria: Secondary | ICD-10-CM

## 2023-09-03 LAB — CERVICOVAGINAL ANCILLARY ONLY
Bacterial Vaginitis (gardnerella): NEGATIVE
Candida Glabrata: NEGATIVE
Candida Vaginitis: NEGATIVE
Comment: NEGATIVE
Comment: NEGATIVE
Comment: NEGATIVE

## 2023-09-04 LAB — URINE CULTURE
MICRO NUMBER:: 16443013
SPECIMEN QUALITY:: ADEQUATE

## 2023-09-06 ENCOUNTER — Encounter: Payer: Self-pay | Admitting: Internal Medicine

## 2023-09-06 ENCOUNTER — Encounter: Payer: Self-pay | Admitting: Family Medicine

## 2023-09-06 DIAGNOSIS — N898 Other specified noninflammatory disorders of vagina: Secondary | ICD-10-CM | POA: Insufficient documentation

## 2023-09-06 DIAGNOSIS — R8290 Unspecified abnormal findings in urine: Secondary | ICD-10-CM | POA: Insufficient documentation

## 2023-09-06 DIAGNOSIS — N390 Urinary tract infection, site not specified: Secondary | ICD-10-CM | POA: Insufficient documentation

## 2023-09-06 NOTE — Progress Notes (Signed)
 SUBJECTIVE:   Chief Complaint  Patient presents with   Urinary Tract Infection    Since Saturday burning and urgency.    HPI Presents for acute visit  Discussed the use of AI scribe software for clinical note transcription with the patient, who gave verbal consent to proceed.  History of Present Illness Gwendolyn [name] is a 53 year old female with recurrent urinary tract infections who presents with UTI symptoms.  She has a history of recurrent urinary tract infections, with the current episode starting on Saturday morning without any apparent trigger such as sexual intercourse. Symptoms include dysuria and urinary frequency, but no hematuria. 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 No history of nephrolithiasis, back pain, or fever.  She has been treated for two UTIs and one yeast infection in the past, with the yeast infection attributed to antibiotic use. She has been prescribed Macrobid  as a prophylactic antibiotic to take postcoitally, but continues to experience infections. She has also been on metformin  for weight management for three years, although she is not diabetic. She reports glucosuria during previous episodes but not consistently.  She underwent a cystoscopy approximately four years ago and has had a kidney biopsy in the past, which confirmed her thin basement membrane disease. She sees a nephrologist annually and last visited in late March. She has a history of taking Cipro  for UTIs, but experienced tendon pain, leading to discontinuation of this medication. She has also been prescribed meloxicam  for bone spurs in her foot.  She underwent a hysterectomy at age 65 and has not had periods since. She reports some vaginal discharge, possibly residual from a previous yeast infection. She has allergies to amoxicillin, codeine, and morphine.     PERTINENT PMH / PSH: As above  OBJECTIVE:  BP 114/68   Pulse 79   Temp 98 F  (36.7 C)   Resp 20   Ht 5\' 6"  (1.676 m)   Wt 175 lb 4 oz (79.5 kg)   SpO2 98%   BMI 28.29 kg/m    Physical Exam Vitals reviewed.  Constitutional:      General: She is not in acute distress.    Appearance: Normal appearance. She is not ill-appearing, toxic-appearing or diaphoretic.  Eyes:     General:        Right eye: No discharge.        Left eye: No discharge.     Conjunctiva/sclera: Conjunctivae normal.  Cardiovascular:     Rate and Rhythm: Normal rate.  Pulmonary:     Effort: Pulmonary effort is normal.  Abdominal:     Tenderness: There is no abdominal tenderness. There is no right CVA tenderness or left CVA tenderness.  Skin:    General: Skin is warm and dry.  Neurological:     General: No focal deficit present.     Mental Status: She is alert and oriented to person, place, and time. Mental status is at baseline.  Psychiatric:        Mood and Affect: Mood normal.        Behavior: Behavior normal.        Thought Content: Thought content normal.        Judgment: Judgment normal.           09/02/2023   10:37 AM 08/19/2023    1:48 PM 07/29/2023   12:47 PM 01/15/2023    1:35 PM 01/08/2022   10:16 AM  Depression screen PHQ 2/9  Decreased  Interest 0 0 0 0 0  Down, Depressed, Hopeless 0 0 0 0 0  PHQ - 2 Score 0 0 0 0 0  Altered sleeping 1 1     Tired, decreased energy 0 0     Change in appetite 1 0     Feeling bad or failure about yourself  0 0     Trouble concentrating 1 1     Moving slowly or fidgety/restless 0 0     Suicidal thoughts 0 0     PHQ-9 Score 3 2     Difficult doing work/chores Not difficult at all Not difficult at all         09/02/2023   10:37 AM 08/19/2023    1:48 PM  GAD 7 : Generalized Anxiety Score  Nervous, Anxious, on Edge 0 1  Control/stop worrying 0 0  Worry too much - different things 0 0  Trouble relaxing 1 1  Restless 0 0  Easily annoyed or irritable 1 0  Afraid - awful might happen 0 1  Total GAD 7 Score 2 3  Anxiety  Difficulty Not difficult at all Somewhat difficult    ASSESSMENT/PLAN:  Recurrent UTI Assessment & Plan: Recurrent UTIs with dysuria and urgency, likely E. coli. Previous antibiotics include Cipro  and Macrobid  with resistance concerns. Differential includes kidney stones or urological issues. Previous urology advice ineffective. - Send urine for culture. - Order CT renal scan - Consider urology referral if CT unremarkable.  Urine culture positive for E.Coli Start Cefdinir  300 mg BID Probiotics daily Repeat urine culture 3-4 weeks after completion of antibiotics   Orders: -     CT RENAL ABDOMEN W WO CONTRAST -     Cefdinir ; Take 1 capsule (300 mg total) by mouth 2 (two) times daily for 5 days.  Dispense: 10 capsule; Refill: 0 -     Urine Culture; Future -     Urinalysis, Routine w reflex microscopic; Future  Vaginal discomfort Assessment & Plan: Possible yeast infection to antibiotic use. No vaginal discharge - Provide self-swab for vaginal discharge.  Orders: -     POCT Urinalysis Dipstick (Automated) -     Cervicovaginal ancillary only  Abnormal urinalysis -     Urine Culture -     Urinalysis, Routine w reflex microscopic    PDMP reviewed  Return in about 4 weeks (around 09/30/2023) for LAB.  Valli Gaw, MD

## 2023-09-06 NOTE — Assessment & Plan Note (Signed)
 Possible yeast infection to antibiotic use. No vaginal discharge - Provide self-swab for vaginal discharge.

## 2023-09-06 NOTE — Assessment & Plan Note (Signed)
 Recurrent UTIs with dysuria and urgency, likely E. coli. Previous antibiotics include Cipro  and Macrobid  with resistance concerns. Differential includes kidney stones or urological issues. Previous urology advice ineffective. - Send urine for culture. - Order CT renal scan - Consider urology referral if CT unremarkable.  Urine culture positive for E.Coli Start Cefdinir  300 mg BID Probiotics daily Repeat urine culture 3-4 weeks after completion of antibiotics

## 2023-09-29 ENCOUNTER — Other Ambulatory Visit: Payer: Self-pay | Admitting: Internal Medicine

## 2023-09-30 ENCOUNTER — Other Ambulatory Visit

## 2023-09-30 DIAGNOSIS — N390 Urinary tract infection, site not specified: Secondary | ICD-10-CM

## 2023-10-01 ENCOUNTER — Telehealth

## 2023-10-01 DIAGNOSIS — N390 Urinary tract infection, site not specified: Secondary | ICD-10-CM | POA: Diagnosis not present

## 2023-10-01 DIAGNOSIS — R319 Hematuria, unspecified: Secondary | ICD-10-CM

## 2023-10-01 LAB — MICROSCOPIC EXAMINATION
Bacteria, UA: NONE SEEN
Casts: NONE SEEN /LPF
RBC, Urine: >30 /HPF — AB (ref 0–2)
WBC, UA: NONE SEEN /HPF (ref 0–5)

## 2023-10-01 LAB — URINALYSIS, ROUTINE W REFLEX MICROSCOPIC
Glucose, UA: NEGATIVE
Ketones, UA: NEGATIVE
Nitrite, UA: POSITIVE — AB
Specific Gravity, UA: 1.025 (ref 1.005–1.030)
Urobilinogen, Ur: 1 mg/dL (ref 0.2–1.0)
pH, UA: 5 (ref 5.0–7.5)

## 2023-10-01 NOTE — Patient Instructions (Signed)
 Sallee Lortz, thank you for joining Blinda Burger, NP for today's virtual visit.  While this provider is not your primary care provider (PCP), if your PCP is located in our provider database this encounter information will be shared with them immediately following your visit.   A [facility] MyChart account gives you access to today's visit and all your visits, tests, and labs performed at [facility] " click here if you don't have a [facility] MyChart account or go to [email or web address]  Consent: (Patient) Gwendolyn [name] provided verbal consent for this virtual visit at the beginning of the encounter.  Current Medications:  Current Outpatient Medications:    amphetamine -dextroamphetamine  (ADDERALL) 30 MG tablet, Take 30 mg by mouth 2 (two) times daily. , Disp: , Rfl: 0   APPLE CIDER VINEGAR PO, Take 1,600 mg by mouth daily., Disp: , Rfl:    asenapine (SAPHRIS) 5 MG SUBL 24 hr tablet, 5 mg at bedtime., Disp: , Rfl:    ASHWAGANDHA PO, Take 3,000 mg by mouth at bedtime., Disp: , Rfl:    Cholecalciferol (VITAMIN D ) 50 MCG (2000 UT) tablet, Take 2,000 Units by mouth daily., Disp: , Rfl:    clotrimazole -betamethasone  (LOTRISONE ) cream, Apply 1 Application topically 2 (two) times daily., Disp: , Rfl:    CRANBERRY PO, Take 30,000 mg by mouth at bedtime., Disp: , Rfl:    Cyanocobalamin (VITAMIN B-12) 5000 MCG TBDP, Take 5,000 mcg by mouth daily., Disp: , Rfl:    DULoxetine  HCl 40 MG CPEP, Take 80 mg by mouth every morning., Disp: , Rfl:    Ferrous Sulfate (IRON PO), Take 25 mg by mouth daily., Disp: , Rfl:    FIBER PO, Take 6 tablets by mouth in the morning and at bedtime., Disp: , Rfl:    GINKGO BILOBA PO, Take 3,000 mg by mouth in the morning and at bedtime., Disp: , Rfl:    Melatonin 10 MG TABS, Take 10 mg by mouth daily., Disp: , Rfl:    meloxicam  (MOBIC ) 15 MG tablet, TAKE 1 TABLET (15 MG TOTAL) BY MOUTH DAILY. AS NEEDED FOR JOINT PAIN, Disp: 30 tablet,  Rfl: 2   metFORMIN  (GLUCOPHAGE -XR) 500 MG 24 hr tablet, TAKE 1 TABLET BY MOUTH EVERY DAY WITH BREAKFAST, Disp: 90 tablet, Rfl: 1   omeprazole  (PRILOSEC) 20 MG capsule, TAKE 1 CAPSULE BY MOUTH EVERY DAY, Disp: 30 capsule, Rfl: 0   ondansetron  (ZOFRAN ) 4 MG tablet, Take 1 tablet (4 mg total) by mouth every 8 (eight) hours as needed for nausea or vomiting., Disp: 20 tablet, Rfl: 0   Probiotic Product (PROBIOTIC PO), Take 1 capsule by mouth daily., Disp: , Rfl:    tiaGABine (GABITRIL) 4 MG tablet, Take 4-16 mg by mouth See admin instructions. Take 4 mg in the morning and 16 mg at bedtime, Disp: , Rfl:    Medications ordered in this encounter:  No orders of the defined types were placed in this encounter.    *If you need refills on other medications prior to your next appointment, please contact your pharmacy*  Follow-Up: Call back or seek an in-person evaluation if the symptoms worsen or if the condition fails to improve as anticipated.  [facility] [phone]  Other Instructions  You do have a UTI but I am guessing about which drug will treat it since the culture report isn't back yet. Please follow up with your primary care provider about the results of the culture.  Can or talking with your primary  care provider about when not you need to see urology for your recent recurrent urinary tract infections.   If you have been instructed to have an in-person evaluation today at a local Urgent Care facility, please use the link below. It will take you to a list of all of our available [facility], including address, phone number and hours of operation. Please do not delay care.  [facility]  If you or a family member do not have a primary care provider, use the link below to schedule a visit and establish care. When you choose a [facility] primary care physician or advanced practice provider, you gain a long-term partner in health. Find a Primary Care  Provider  Learn more about [facility]'s in-office and virtual care options: [facility] - Get Care Now

## 2023-10-01 NOTE — Progress Notes (Signed)
 Virtual Visit Consent   Gwendolyn [name], you are scheduled for a virtual visit with a [facility] provider today. Just as with appointments in the office, your consent must be obtained to participate. Your consent will be active for this visit and any virtual visit you may have with one of our providers in the next 365 days. If you have a MyChart account, a copy of this consent can be sent to you electronically.  As this is a virtual visit, video technology does not allow for your provider to perform a traditional examination. This may limit your provider's ability to fully assess your condition. If your provider identifies any concerns that need to be evaluated in person or the need to arrange testing (such as labs, EKG, etc.), we will make arrangements to do so. Although advances in technology are sophisticated, we cannot ensure that it will always work on either your end or our end. If the connection with a video visit is poor, the visit may have to be switched to a telephone visit. With either a video or telephone visit, we are not always able to ensure that we have a secure connection.  By engaging in this virtual visit, you consent to the provision of healthcare and authorize for your insurance to be billed (if applicable) for the services provided during this visit. Depending on your insurance coverage, you may receive a charge related to this service.  I need to obtain your verbal consent now. Are you willing to proceed with your visit today? Isaiah Fera has provided verbal consent on 10/01/2023 for a virtual visit (video or telephone). Blinda Burger, NP  Date: 10/01/2023 11:35 AM   Virtual Visit via Video Note   I, Blinda Burger, connected with  Gwendolyn [name]  ([id number], Jan 30, 1971) on 10/01/23 at 11:15 AM EDT by a video-enabled telemedicine application and verified that I am speaking with the correct person using two identifiers.  Location: Patient: Virtual  Visit Location Patient: Home Provider: Virtual Visit Location Provider: Home Office   I discussed the limitations of evaluation and management by telemedicine and the availability of in person appointments. The patient expressed understanding and agreed to proceed.    History of Present Illness: Gwendolyn [name] is a 53 y.o. who identifies as a female who was assigned female at birth, and is being seen today for postiive nitrites on UA yesterday at PCP office. Has had recurrent UTIs recently. Last treated with cefdinir  in May, felt 80% better after last round of cefidinir in May but not completely better. Sx now stronger/back again. Has burning with urination and urinary frequency. UA also shows RBCs - she has thin basement membrane disease and sees neprhology annually for this.   UA and culture at PCP's office yesteday Culture not back yet.  No appts available. Wants tx.   HPI: HPI  Problems:  Patient Active Problem List   Diagnosis Date Noted   Recurrent UTI 09/06/2023   Abnormal urinalysis 09/06/2023   Vaginal discharge 09/06/2023   Vaginal discomfort 08/25/2023   Cystitis 01/28/2023   H/O liposuction of abdomen 01/15/2023   Overweight (BMI 25.0-29.9) 11/24/2021   Somnambulism 06/01/2021   Nocturia 03/20/2021   Arthritis of right acromioclavicular joint    Stomach irritation    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   History of tobacco abuse 03/13/2019   Encounter for tobacco use cessation counseling 03/13/2019   Genetic testing 01/19/2019   Family history of brain cancer    Family history of colon  cancer    Postcoital UTI 12/08/2018   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   Hematuria 12/05/2017   Rectal bleeding 09/02/2017   Encounter for preventive health examination 11/22/2016   History of abdominal supracervical subtotal hysterectomy 11/22/2016   Plantar fasciitis 11/10/2015   Menopause 01/18/2015   Hemorrhoids  10/06/2014   Hyperlipidemia LDL goal <160 10/05/2014   Vitamin D  deficiency 10/05/2014   Depressive disorder 10/05/2014   History of breast cancer in adulthood 10/05/2014   Migraine without aura with status migrainosus 10/05/2014   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 , Disp: , Rfl: 0   APPLE CIDER VINEGAR PO, Take 1,600 mg by mouth daily., Disp: , Rfl:    asenapine (SAPHRIS) 5 MG SUBL 24 hr tablet, 5 mg at bedtime., Disp: , Rfl:    ASHWAGANDHA PO, Take 3,000 mg by mouth at bedtime., Disp: , Rfl:    Cholecalciferol (VITAMIN D ) 50 MCG (2000 UT) tablet, Take 2,000 Units by mouth daily., Disp: , Rfl:    clotrimazole -betamethasone  (LOTRISONE ) cream, Apply 1 Application topically 2 (two) times daily., Disp: , Rfl:    CRANBERRY PO, Take 30,000 mg by mouth at bedtime., Disp: , Rfl:    Cyanocobalamin (VITAMIN B-12) 5000 MCG TBDP, Take 5,000 mcg by mouth daily., Disp: , Rfl:    DULoxetine  HCl 40 MG CPEP, Take 80 mg by mouth every morning., Disp: , Rfl:    Ferrous Sulfate (IRON PO), Take 25 mg by mouth daily., Disp: , Rfl:    FIBER PO, Take 6 tablets by mouth in the morning and at bedtime., Disp: , Rfl:    GINKGO BILOBA PO, Take 3,000 mg by mouth in the morning and at bedtime., Disp: , Rfl:    Melatonin 10 MG TABS, Take 10 mg by mouth daily., Disp: , Rfl:    meloxicam  (MOBIC ) 15 MG tablet, TAKE 1 TABLET (15 MG TOTAL) BY MOUTH DAILY. AS NEEDED FOR  JOINT PAIN, Disp: 30 tablet, Rfl: 2   metFORMIN  (GLUCOPHAGE -XR) 500 MG 24 hr tablet, TAKE 1 TABLET BY MOUTH EVERY DAY WITH BREAKFAST, Disp: 90 tablet, Rfl: 1   omeprazole  (PRILOSEC) 20 MG capsule, TAKE 1 CAPSULE BY MOUTH EVERY DAY, Disp: 30 capsule, Rfl: 0   ondansetron  (ZOFRAN ) 4 MG tablet, Take 1 tablet (4 mg total) by mouth every 8 (eight) hours as needed for nausea or vomiting., Disp: 20 tablet, Rfl: 0   Probiotic Product (PROBIOTIC PO), Take 1 capsule by mouth daily., Disp: , Rfl:    tiaGABine (GABITRIL) 4 MG tablet, Take 4-16 mg by mouth See admin instructions. Take 4 mg in the morning and 16 mg at bedtime, Disp: , Rfl:   Observations/Objective: Patient is well-developed, well-nourished in no acute distress.  Resting comfortably  at home.  Head is normocephalic, atraumatic.  No labored breathing.  Speech is clear and coherent with logical content.  Patient is alert and oriented at baseline.    Assessment  and Plan: 1. Urinary tract infection with hematuria, site unspecified (Primary)  Based on culture from May and response to tx with cefdinir  in May, will treat again with Cefdinir  but for longer course of antibiotics. Pt is aware she may need to change antibitoics when culture is back. Discussed seeing urology. She iwll talk with pcp about htis  Follow Up Instructions: I discussed the assessment and treatment plan with the patient. The patient was provided an opportunity to ask questions and all were answered. The patient agreed with the plan and demonstrated an understanding of the instructions.  A copy of instructions were sent to the patient via MyChart unless otherwise noted below.    The patient was advised to call back or seek an in-person evaluation if the symptoms worsen or if the condition fails to improve as anticipated.    Blinda Burger, NP

## 2023-10-02 ENCOUNTER — Other Ambulatory Visit: Payer: Self-pay | Admitting: Family Medicine

## 2023-10-02 ENCOUNTER — Encounter: Payer: Self-pay | Admitting: Internal Medicine

## 2023-10-02 LAB — URINE CULTURE

## 2023-10-02 MED ORDER — NITROFURANTOIN MONOHYD MACRO 100 MG PO CAPS: 100.0000 mg | ORAL_CAPSULE | Freq: Two times a day (BID) | ORAL | 0 refills | Status: AC

## 2023-10-03 ENCOUNTER — Ambulatory Visit: Admission: RE | Admit: 2023-10-03 | Source: Ambulatory Visit

## 2023-10-17 ENCOUNTER — Other Ambulatory Visit: Payer: Self-pay

## 2023-10-17 ENCOUNTER — Other Ambulatory Visit (INDEPENDENT_AMBULATORY_CARE_PROVIDER_SITE_OTHER)

## 2023-10-17 DIAGNOSIS — N390 Urinary tract infection, site not specified: Secondary | ICD-10-CM | POA: Diagnosis not present

## 2023-10-17 LAB — URINALYSIS, ROUTINE W REFLEX MICROSCOPIC
Bilirubin Urine: NEGATIVE
Hgb urine dipstick: NEGATIVE
Ketones, ur: NEGATIVE
Leukocytes,Ua: NEGATIVE
Nitrite: NEGATIVE
Specific Gravity, Urine: >=1.03 — AB (ref 1.000–1.030)
Urine Glucose: NEGATIVE
Urobilinogen, UA: 0.2 (ref 0.0–1.0)
pH: 6 (ref 5.0–8.0)

## 2023-10-17 NOTE — Progress Notes (Signed)
 ur

## 2023-10-18 LAB — URINE CULTURE
MICRO NUMBER:: 16629244
Result:: NO GROWTH
SPECIMEN QUALITY:: ADEQUATE

## 2023-10-20 ENCOUNTER — Ambulatory Visit: Payer: Self-pay | Admitting: Internal Medicine

## 2023-10-22 ENCOUNTER — Ambulatory Visit: Payer: Self-pay | Admitting: Family Medicine

## 2023-10-22 ENCOUNTER — Ambulatory Visit: Admitting: Family Medicine

## 2023-10-22 VITALS — BP 112/68 | HR 83 | Temp 98.2°F | Ht 66.0 in | Wt 158.8 lb

## 2023-10-22 DIAGNOSIS — B379 Candidiasis, unspecified: Secondary | ICD-10-CM

## 2023-10-22 DIAGNOSIS — R300 Dysuria: Secondary | ICD-10-CM | POA: Diagnosis not present

## 2023-10-22 LAB — POC URINALSYSI DIPSTICK (AUTOMATED)
Bilirubin, UA: POSITIVE
Blood, UA: POSITIVE
Glucose, UA: NEGATIVE
Ketones, UA: POSITIVE
Nitrite, UA: POSITIVE
Protein, UA: POSITIVE — AB
Spec Grav, UA: 1.02 (ref 1.010–1.025)
Urobilinogen, UA: >=8 U/dL — AB
pH, UA: 5 (ref 5.0–8.0)

## 2023-10-22 MED ORDER — SULFAMETHOXAZOLE-TRIMETHOPRIM 800-160 MG PO TABS: 1.0000 | ORAL_TABLET | Freq: Two times a day (BID) | ORAL | 0 refills | Status: AC

## 2023-10-22 MED ORDER — FLUCONAZOLE 150 MG PO TABS: 150.0000 mg | ORAL_TABLET | Freq: Once | ORAL | 0 refills | Status: AC

## 2023-10-22 MED ORDER — SEMAGLUTIDE (2 MG/DOSE) 8 MG/3ML ~~LOC~~ SOPN: 2.0000 mg | PEN_INJECTOR | SUBCUTANEOUS | Status: DC

## 2023-10-22 MED ORDER — AMPHETAMINE-DEXTROAMPHETAMINE 30 MG PO TABS: 30.0000 mg | ORAL_TABLET | Freq: Two times a day (BID) | ORAL | Status: AC

## 2023-10-22 NOTE — Patient Instructions (Signed)
 Hold metformin  while on septra .  Drink plenty of water  and start the antibiotics today.  We'll contact you with your lab report.  Take care.   Diflucan  if needed.

## 2023-10-22 NOTE — Progress Notes (Unsigned)
 dysuria: yes duration of symptoms:she never got to the point of total resolution, with urinary urgency.   abdominal pain: some suprapubic pain  fevers: no back pain:no vomiting: once about 1 week ago, unclear if from ozempic .    She had leg aches with cipro .  Took pyridium yesterday, u/a likely affected.  That helped with sx.    Had taken cefdinir  and macrobid  in the past.   Potentially travelling to [place] soon, her sister is getting married-  but the trip may get cancelled. Her husband has pancreatic cancer, discussed.  Condolences offered.    Ucx pending, d/w pt.   Meds, vitals, and allergies reviewed.   Per HPI unless specifically indicated in ROS section   GEN: nad, alert and oriented HEENT: ncat NECK: supple CV: rrr.  PULM: ctab, no inc wob ABD: soft, +bs, suprapubic area tender EXT: no edema SKIN: no acute rash BACK: no CVA pain

## 2023-10-23 DIAGNOSIS — R300 Dysuria: Secondary | ICD-10-CM | POA: Insufficient documentation

## 2023-10-23 LAB — URINE CULTURE
MICRO NUMBER:: 16646660
Result:: NO GROWTH
SPECIMEN QUALITY:: ADEQUATE

## 2023-10-23 NOTE — Assessment & Plan Note (Signed)
 Presumed cystitis.  Okay for outpatient follow-up.  Take plenty of fluids by mouth, start Septra . Hold metformin  while on septra .  Urine culture pending. Routine cautions given to patient.  Okay for outpatient follow-up.

## 2023-12-11 ENCOUNTER — Other Ambulatory Visit (INDEPENDENT_AMBULATORY_CARE_PROVIDER_SITE_OTHER)

## 2023-12-11 ENCOUNTER — Telehealth: Payer: Self-pay

## 2023-12-11 DIAGNOSIS — R300 Dysuria: Secondary | ICD-10-CM

## 2023-12-11 DIAGNOSIS — R8281 Pyuria: Secondary | ICD-10-CM

## 2023-12-11 MED ORDER — SULFAMETHOXAZOLE-TRIMETHOPRIM 800-160 MG PO TABS
1.0000 | ORAL_TABLET | Freq: Two times a day (BID) | ORAL | 0 refills | Status: DC
Start: 2023-12-11 — End: 2024-01-20

## 2023-12-11 NOTE — Telephone Encounter (Signed)
Is it okay to order? 

## 2023-12-11 NOTE — Telephone Encounter (Signed)
 Copied from CRM #[id number]. Topic: Clinical - Request for Lab/Test Order >> Dec 11, 2023 10:50 AM [place] J wrote: Reason for CRM: Patient would like an order for a urine analysis to be sent in by today. She believes she has a UTI and is experiencing burning and frequent urination.  Patient denied triage and just wants an order sent in for testing. She wanted to let Dr. Marylynn know that she goes out of town tomorrow and would like to schedule a lab appointment by 2:00 today.

## 2023-12-11 NOTE — Addendum Note (Signed)
 Addended by: Oree Mirelez on: 12/11/2023 01:57 PM   Modules accepted: Orders

## 2023-12-11 NOTE — Telephone Encounter (Signed)
 Spoke with pt and scheduled her for a lab appt. Pt stated that she is leaving to go out of town tomorrow and would like to know if she could go ahead and get an antibiotic sent in. Pt stated that she has had painful urination, burning and frequent urination since early this morning. She stated that symptoms woke her up.

## 2023-12-11 NOTE — Addendum Note (Signed)
 Addended by: MARYLYNN VERNEITA CROME on: 12/11/2023 03:17 PM   Modules accepted: Orders

## 2023-12-11 NOTE — Telephone Encounter (Signed)
 Spoke with pt to let her know that the Septra  has been sent to pharmacy. Pt gave a verbal understanding.

## 2023-12-12 ENCOUNTER — Ambulatory Visit: Payer: Self-pay | Admitting: Internal Medicine

## 2023-12-12 LAB — URINALYSIS, ROUTINE W REFLEX MICROSCOPIC

## 2023-12-12 LAB — URINE CULTURE
MICRO NUMBER:: 16858714
SPECIMEN QUALITY:: ADEQUATE

## 2023-12-12 NOTE — Addendum Note (Signed)
 Addended by: MARYLYNN VERNEITA CROME on: 12/12/2023 09:40 PM   Modules accepted: Orders

## 2023-12-20 ENCOUNTER — Other Ambulatory Visit: Payer: Self-pay

## 2023-12-20 DIAGNOSIS — R300 Dysuria: Secondary | ICD-10-CM

## 2023-12-24 ENCOUNTER — Ambulatory Visit: Admitting: Urology

## 2023-12-24 ENCOUNTER — Other Ambulatory Visit: Payer: Self-pay

## 2023-12-24 ENCOUNTER — Other Ambulatory Visit
Admission: RE | Admit: 2023-12-24 | Discharge: 2023-12-24 | Disposition: A | Discharge status: Home / Self Care | Attending: Urology | Admitting: Urology

## 2023-12-24 VITALS — BP 104/71 | HR 86 | Wt 155.0 lb

## 2023-12-24 DIAGNOSIS — R399 Unspecified symptoms and signs involving the genitourinary system: Secondary | ICD-10-CM

## 2023-12-24 DIAGNOSIS — R300 Dysuria: Secondary | ICD-10-CM | POA: Diagnosis present

## 2023-12-24 DIAGNOSIS — N390 Urinary tract infection, site not specified: Secondary | ICD-10-CM

## 2023-12-24 DIAGNOSIS — R3121 Asymptomatic microscopic hematuria: Secondary | ICD-10-CM

## 2023-12-24 LAB — URINALYSIS, COMPLETE (UACMP) WITH MICROSCOPIC
Bilirubin Urine: NEGATIVE
Glucose, UA: NEGATIVE mg/dL
Leukocytes,Ua: NEGATIVE
Nitrite: NEGATIVE
RBC / HPF: >50 RBC/hpf (ref 0–5)
Specific Gravity, Urine: >1.03 — ABNORMAL HIGH (ref 1.005–1.030)
pH: 5.5 (ref 5.0–8.0)

## 2023-12-24 MED ORDER — NITROFURANTOIN MONOHYD MACRO 100 MG PO CAPS: 100.0000 mg | ORAL_CAPSULE | Freq: Every day | ORAL | 0 refills | Status: DC

## 2023-12-24 MED ORDER — CEFPODOXIME PROXETIL 100 MG PO TABS: 100.0000 mg | ORAL_TABLET | Freq: Two times a day (BID) | ORAL | 0 refills | Status: DC

## 2023-12-24 NOTE — Progress Notes (Signed)
 12/24/23 9:04 AM   Charl Homestead 1970-05-15 [id number]  CC: Recurrent UTI, microscopic hematuria, dysuria  HPI: Last seen by me in 2021 for recurrent UTIs and microscopic hematuria Microscopic hematuria workup with CT and cystoscopy negative, thin basement membranes on kidney biopsy by nephrology felt to be etiology of persistent microscopic hematuria UTIs were associated with new sexual partner, ultimately resolved with cranberry tablet prophylaxis and trimethoprim  prophylaxis History of breast cancer Recurrent infections in May and June 2025 with E. coli, treated with antibiotics with initial improvement but symptoms recurred, recent urine cultures have been negative and rereferred to urology PCP recommended CT but secondary to cost patient deferred  Today-ongoing dysuria, urgency, frequency, denies gross hematuria, feels like she has UTI.  Symptoms have improved with antibiotics.  PMH: Past Medical History:  Diagnosis Date   Anal fissure    Anxiety    Arthritis    Breast cancer (HCC) 2002   lumpectomy-Right   Depression    Family history of brain cancer    Family history of colon cancer    Fibromyalgia    Gallstones    GERD (gastroesophageal reflux disease)    Irritable bowel syndrome    Motion sickness    cars, boats   Personal history of radiation therapy    Yeast vaginitis     Surgical History: Past Surgical History:  Procedure Laterality Date   ABDOMINAL HYSTERECTOMY     BREAST BIOPSY Right 2002   Breast cancer DCIS   BREAST BIOPSY     BREAST LUMPECTOMY Right 2003   Radiatiomn Therapy DCIS   CHOLECYSTECTOMY  2000   COLECTOMY  10/31/2012   SECONDARY TO COLONIC INERTIA   ESOPHAGOGASTRODUODENOSCOPY (EGD) WITH PROPOFOL  N/A 05/11/2020   Procedure: ESOPHAGOGASTRODUODENOSCOPY (EGD) WITH PROPOFOL ;  Surgeon: Janalyn Keene NOVAK, MD;  Location: [facility] ENDOSCOPY;  Service: Endoscopy;  Laterality: N/A;   REDUCTION MAMMAPLASTY Bilateral 11/13/2018   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     Family History: Family History  Problem Relation Age of Onset   Meniere's disease Father    Alcohol abuse Sister    Heart disease Maternal Grandmother    Heart disease Maternal Grandfather    Brain cancer Maternal Grandfather    Heart disease Paternal Grandmother    Heart disease Paternal Grandfather    Rectal cancer Other        MGF's mother   Colon cancer Neg Hx    Esophageal cancer Neg Hx    Breast cancer Neg Hx     Social History:  reports that she has been smoking cigarettes. She has a 10 pack-year smoking history. She has never used smokeless tobacco. She reports current alcohol use. She reports that she does not use drugs.  Physical Exam: BP 104/71 (BP Location: Left Arm, Patient Position: Sitting, Cuff Size: Normal)   Pulse 86   Wt 155 lb (70.3 kg)   SpO2 98%   BMI 25.02 kg/m    Constitutional:  Alert and oriented, No acute distress. Cardiovascular: No clubbing, cyanosis, or edema. Respiratory: Normal respiratory effort, no increased work of breathing. GI: Abdomen is soft, nontender, nondistended, no abdominal masses   Laboratory Data: Culture data reviewed in epic Urinalysis today  11-20 squamous cells, 6-10 WBC, greater than 50 RBC, many bacteria, calcium oxalate crystals present  Assessment & Plan:   53 year old female with history of breast cancer, with recurrent UTIs and concerning appearing urinalysis despite recent negative cultures.  Symptoms have improved with antibiotics previously.  We discussed possible etiologies including recurrent UTI with incomplete treatment, nephrolithiasis,  malignancy.  We discussed the evaluation and treatment of patients with recurrent UTIs at length.  We specifically discussed the differences between asymptomatic bacteriuria and true urinary tract infection.  We discussed the AUA definition of recurrent UTI of at least 2 culture proven symptomatic acute cystitis episodes in a 16-month period, or 3 within a 1 year period.  We discussed the importance of culture directed antibiotic treatment, and antibiotic stewardship.  First-line therapy includes nitrofurantoin (5 days), Bactrim (3 days), or fosfomycin(3 g single dose).  Possible etiologies of recurrent infection include periurethral tissue atrophy in postmenopausal woman, constipation, sexual activity, incomplete emptying, anatomic abnormalities, and even genetic predisposition.  Finally, we discussed the role of perineal hygiene, timed voiding, adequate hydration, topical vaginal estrogen, cranberry prophylaxis, and low-dose antibiotic prophylaxis.  -Cefpodoxime  100 mg twice daily x 7 days, then nitrofurantoin  load daily prophylaxis x 90 days -Again strongly recommended CT/cystoscopy if persistent symptoms, patient defers and would like to trial above strategy.  Willing to consider CT/cystoscopy if no improvement with above -Follow-up urine culture and atypical  [name], MD 12/24/2023  [facility] [address] [place], [place] [address] [phone]

## 2023-12-25 LAB — URINE CULTURE: Culture: NO GROWTH

## 2023-12-31 LAB — MISC LABCORP TEST (SEND OUT): Labcorp test code: 86884

## 2024-01-05 ENCOUNTER — Other Ambulatory Visit: Payer: Self-pay | Admitting: Internal Medicine

## 2024-01-15 ENCOUNTER — Encounter: Payer: BC Managed Care – PPO | Admitting: Internal Medicine

## 2024-01-20 ENCOUNTER — Encounter: Payer: Self-pay | Admitting: Internal Medicine

## 2024-01-20 ENCOUNTER — Ambulatory Visit (INDEPENDENT_AMBULATORY_CARE_PROVIDER_SITE_OTHER): Admitting: Internal Medicine

## 2024-01-20 VITALS — BP 94/66 | HR 85 | Ht 66.0 in | Wt 158.6 lb

## 2024-01-20 DIAGNOSIS — Z23 Encounter for immunization: Secondary | ICD-10-CM | POA: Diagnosis not present

## 2024-01-20 DIAGNOSIS — R3129 Other microscopic hematuria: Secondary | ICD-10-CM | POA: Diagnosis not present

## 2024-01-20 DIAGNOSIS — E785 Hyperlipidemia, unspecified: Secondary | ICD-10-CM | POA: Diagnosis not present

## 2024-01-20 DIAGNOSIS — E663 Overweight: Secondary | ICD-10-CM

## 2024-01-20 DIAGNOSIS — F3181 Bipolar II disorder: Secondary | ICD-10-CM

## 2024-01-20 DIAGNOSIS — Z0000 Encounter for general adult medical examination without abnormal findings: Secondary | ICD-10-CM | POA: Diagnosis not present

## 2024-01-20 DIAGNOSIS — N390 Urinary tract infection, site not specified: Secondary | ICD-10-CM | POA: Diagnosis not present

## 2024-01-20 DIAGNOSIS — K625 Hemorrhage of anus and rectum: Secondary | ICD-10-CM

## 2024-01-20 DIAGNOSIS — L309 Dermatitis, unspecified: Secondary | ICD-10-CM

## 2024-01-20 DIAGNOSIS — N029 Recurrent and persistent hematuria with unspecified morphologic changes: Secondary | ICD-10-CM

## 2024-01-20 DIAGNOSIS — E559 Vitamin D deficiency, unspecified: Secondary | ICD-10-CM | POA: Diagnosis not present

## 2024-01-20 DIAGNOSIS — F4321 Adjustment disorder with depressed mood: Secondary | ICD-10-CM

## 2024-01-20 DIAGNOSIS — R8290 Unspecified abnormal findings in urine: Secondary | ICD-10-CM

## 2024-01-20 DIAGNOSIS — Z1231 Encounter for screening mammogram for malignant neoplasm of breast: Secondary | ICD-10-CM

## 2024-01-20 MED ORDER — CLOTRIMAZOLE-BETAMETHASONE 1-0.05 % EX CREA: 1.0000 | TOPICAL_CREAM | Freq: Two times a day (BID) | CUTANEOUS | 1 refills | Status: AC

## 2024-01-20 NOTE — Progress Notes (Unsigned)
 Patient ID: Gwendolyn [name], female    DOB: Mar 14, 1971  Age: 53 y.o. MRN: [id number]  The patient is here for annual preventive examination and management of other chronic and acute problems.   The risk factors are reflected in the social history.   The roster of all physicians providing medical care to patient - is listed in the Snapshot section of the chart.   Activities of daily living:  The patient is 100% independent in all ADLs: dressing, toileting, feeding as well as independent mobility   Home safety : The patient has smoke detectors in the home. They wear seatbelts.  There are no unsecured firearms at home. There is no violence in the home.    There is no risks for hepatitis, STDs or HIV. There is no   history of blood transfusion. They have no travel history to infectious disease endemic areas of the world.   The patient has seen their dentist in the last six month. They have seen their eye doctor in the last year. The patinet  denies slight hearing difficulty with regard to whispered voices and some television programs.  They have deferred audiologic testing in the last year.  They do not  have excessive sun exposure. Discussed the need for sun protection: hats, long sleeves and use of sunscreen if there is significant sun exposure.    Diet: the importance of a healthy diet is discussed. They do have a healthy diet.   The benefits of regular aerobic exercise were discussed. The patient  exercises 5 days per week  for  60 minutes.    Depression screen: there are no signs or vegative symptoms of depression- irritability, change in appetite, anhedonia, sadness/tearfullness. She is grieving the loss of her husband Lamar who died in 08/04/25of pancreatic CA    The following portions of the patient's history were reviewed and updated as appropriate: allergies, current medications, past family history, past medical history,  past surgical history, past social history  and problem  list.   Visual acuity was not assessed per patient preference since the patient has regular follow up with an  ophthalmologist. Hearing and body mass index were assessed and reviewed.    During the course of the visit the patient was educated and counseled about appropriate screening and preventive services including : fall prevention , diabetes screening, nutrition counseling, colorectal cancer screening, and recommended immunizations.    Chief Complaint:   1) husband Lynwood Beverlie)  died in 11-25-2023 of pancreatic CA   2) overweight L: she has been  using  semaglutide   obtained for $400/month from [facility], getting ready to  increase dose  from 1.7 to 2.0 mg      Review of Symptoms  Patient denies headache, fevers, malaise, unintentional weight loss, skin rash, eye pain, sinus congestion and sinus pain, sore throat, dysphagia,  hemoptysis , cough, dyspnea, wheezing, chest pain, palpitations, orthopnea, edema, abdominal pain, nausea, melena, diarrhea, constipation, flank pain, dysuria, hematuria, urinary  Frequency, nocturia, numbness, tingling, seizures,  Focal weakness, Loss of consciousness,  Tremor, insomnia, depression, anxiety, and suicidal ideation.    Physical Exam:  BP 94/66   Pulse 85   Ht 5' 6 (1.676 m)   Wt 158 lb 9.6 oz (71.9 kg)   SpO2 97%   BMI 25.60 kg/m    Physical Exam Vitals reviewed.  Constitutional:      General: She is not in acute distress.    Appearance: Normal appearance. She is well-developed and  normal weight. She is not ill-appearing, toxic-appearing or diaphoretic.  HENT:     Head: Normocephalic.     Right Ear: Tympanic membrane, ear canal and external ear normal. There is no impacted cerumen.     Left Ear: Tympanic membrane, ear canal and external ear normal. There is no impacted cerumen.     Nose: Nose normal.     Mouth/Throat:     Mouth: Mucous membranes are moist.     Pharynx: Oropharynx is clear.  Eyes:     General: No scleral icterus.        Right eye: No discharge.        Left eye: No discharge.     Conjunctiva/sclera: Conjunctivae normal.     Pupils: Pupils are equal, round, and reactive to light.  Neck:     Thyroid : No thyromegaly.     Vascular: No carotid bruit or JVD.  Cardiovascular:     Rate and Rhythm: Normal rate and regular rhythm.     Heart sounds: Normal heart sounds.  Pulmonary:     Effort: Pulmonary effort is normal. No respiratory distress.     Breath sounds: Normal breath sounds.  Chest:  Breasts:    Breasts are symmetrical.     Right: Normal. No swelling, inverted nipple, mass, nipple discharge, skin change or tenderness.     Left: Normal. No swelling, inverted nipple, mass, nipple discharge, skin change or tenderness.  Abdominal:     General: Bowel sounds are normal.     Palpations: Abdomen is soft. There is no mass.     Tenderness: There is no abdominal tenderness. There is no guarding or rebound.  Musculoskeletal:        General: Normal range of motion.     Cervical back: Normal range of motion and neck supple.  Lymphadenopathy:     Cervical: No cervical adenopathy.     Upper Body:     Right upper body: No supraclavicular, axillary or pectoral adenopathy.     Left upper body: No supraclavicular, axillary or pectoral adenopathy.  Skin:    General: Skin is warm and dry.  Neurological:     General: No focal deficit present.     Mental Status: She is alert and oriented to person, place, and time. Mental status is at baseline.  Psychiatric:        Mood and Affect: Mood normal.        Behavior: Behavior normal.        Thought Content: Thought content normal.        Judgment: Judgment normal.     Assessment and Plan: Encounter for screening mammogram for malignant neoplasm of breast -     3D Screening Mammogram, Left and Right; Future  Eczema, unspecified type -     Ambulatory referral to Urogynecology  Vitamin D  deficiency  Hyperlipidemia LDL goal <160 -     Comprehensive metabolic  panel with GFR; Future -     Lipid panel; Future -     TSH; Future  Rectal bleeding -     Iron, TIBC and Ferritin Panel; Future -     CBC with Differential/Platelet; Future  Need for hepatitis B booster vaccination -     Heplisav-B (HepB-CPG) Vaccine  Abnormal urinalysis Assessment & Plan: She has chronic microscopic hematuria, which has been  attribute d to thin basement membrane disease of kidneys following evaluation by cystoscopy in 2021 which  ruled out malignancy and interstitial cystitis. She has recurrent symptoms  of dysuria and suprapubic cramping without documented evidence of infection , but has deferred  repeat imaging in favor of a trial of nitrofurantoin  x 90 days.  ..     Bipolar 2 disorder [facility]) Assessment & Plan: Her mood has been stable on current regimen of medications managed by Dr Chipper.    Microscopic hematuria Assessment & Plan: Secondary to thin basement membrane disease PER UROLOGY.    Thin basement membrane nephropathy  Thin basement membrane disease Assessment & Plan: RESULTING IN CHRONIC MICROSCOPIC HEMATURIA .     Encounter for preventive health examination Assessment & Plan: age appropriate education and counseling updated, referrals for preventative services and immunizations addressed, dietary and smoking counseling addressed, most recent labs reviewed.  I have personally reviewed and have noted:   1) the patient's medical and social history 2) The pt's use of alcohol, tobacco, and illicit drugs 3) The patient's current medications and supplements 4) Functional ability including ADL's, fall risk, home safety risk, hearing and visual impairment 5) Diet and physical activities 6) Evidence for depression or mood disorder 7) The patient's height, weight, and BMI have been recorded in the chart  I have made referrals, and provided counseling and education based on review of the above    Grief Assessment & Plan: Patient is dealing with the  unexpected loss of her husband Lamar in July 2025 and has adequate coping skills and emotional support .     Overweight (BMI 25.0-29.9) Assessment & Plan: She is managing her weight with semaglutide  obtained OOP through [facility].  She denies abdominal pain and constipation    Recurrent UTI Assessment & Plan: Patient has recurrent symptoms without eviene of infection and is under the care of Urology.  She Should NOT be treated empirically . By Urology.  Currently being treated with 90 day course of nitofurantoin 100 mg daily by Urology.  Cannot us  vaginal estrogen due to personal history of estrogen receptor positive breast cancer . Urogyn evaluation recommended to rule out contributing factors.    Other orders -     Clotrimazole -Betamethasone ; Apply 1 Application topically 2 (two) times daily.  Dispense: 45 g; Refill: 1    No follow-ups on file.  Verneita LITTIE Kettering, MD

## 2024-01-20 NOTE — Patient Instructions (Addendum)
 YOUR MAMMOGRAM Ihas been ordered , PLEASE CALL AND GET THIS SCHEDULED! [facility] - call [phone]  Veronda does  the scheduling for [facility] as well       You received the first dose of the Hepatitis B vaccine.  Please schedule an RB/Lab appt (both) for the second /last dose AND labs in 5  to 8 weeks   I have made a referral to [facility] in the event that your insurance will allow the referral .

## 2024-01-21 DIAGNOSIS — F4321 Adjustment disorder with depressed mood: Secondary | ICD-10-CM | POA: Insufficient documentation

## 2024-01-21 DIAGNOSIS — N029 Recurrent and persistent hematuria with unspecified morphologic changes: Secondary | ICD-10-CM | POA: Insufficient documentation

## 2024-01-21 NOTE — Assessment & Plan Note (Signed)
 Patient has recurrent symptoms without eviene of infection and is under the care of Urology.  She Should NOT be treated empirically . By Urology.  Currently being treated with 90 day course of nitofurantoin 100 mg daily by Urology.  Cannot us  vaginal estrogen due to personal history of estrogen receptor positive breast cancer . Urogyn evaluation recommended to rule out contributing factors.

## 2024-01-21 NOTE — Assessment & Plan Note (Signed)
 Secondary to thin basement membrane disease PER UROLOGY.

## 2024-01-21 NOTE — Assessment & Plan Note (Signed)
Her mood has been stable on current regimen of medications managed by Dr Nicolasa Ducking.

## 2024-01-21 NOTE — Assessment & Plan Note (Signed)

## 2024-01-21 NOTE — Assessment & Plan Note (Signed)
 Patient is dealing with the unexpected loss of her husband Gwendolyn [name] in July 2025 and has adequate coping skills and emotional support .

## 2024-01-21 NOTE — Assessment & Plan Note (Signed)
 RESULTING IN CHRONIC MICROSCOPIC HEMATURIA .

## 2024-01-21 NOTE — Assessment & Plan Note (Signed)
 She is managing her weight with semaglutide  obtained OOP through [employer].  She denies abdominal pain and constipation

## 2024-01-21 NOTE — Assessment & Plan Note (Addendum)
 She has chronic microscopic hematuria, which has been  attribute d to thin basement membrane disease of kidneys following evaluation by cystoscopy in 2021 which  ruled out malignancy and interstitial cystitis. She has recurrent symptoms  of dysuria and suprapubic cramping without documented evidence of infection , but has deferred  repeat imaging in favor of a trial of nitrofurantoin  x 90 days.  SABRASABRA

## 2024-01-31 ENCOUNTER — Other Ambulatory Visit: Payer: Self-pay | Admitting: Internal Medicine

## 2024-02-19 ENCOUNTER — Ambulatory Visit

## 2024-02-19 DIAGNOSIS — Z23 Encounter for immunization: Secondary | ICD-10-CM | POA: Diagnosis not present

## 2024-02-19 DIAGNOSIS — E785 Hyperlipidemia, unspecified: Secondary | ICD-10-CM

## 2024-02-19 DIAGNOSIS — K625 Hemorrhage of anus and rectum: Secondary | ICD-10-CM

## 2024-02-19 NOTE — Progress Notes (Signed)
 Patient was administered the 2nd Hep B immunization into her right deltoid. Patient tolerated the Hep B immunization well.

## 2024-02-19 NOTE — Addendum Note (Signed)
 Addended by: MARYLEN PRO A on: 02/19/2024 08:14 AM   Modules accepted: Orders

## 2024-02-20 ENCOUNTER — Ambulatory Visit: Payer: Self-pay | Admitting: Internal Medicine

## 2024-02-20 LAB — CBC WITH DIFFERENTIAL/PLATELET
Basophils Absolute: 0 x10E3/uL (ref 0.0–0.2)
Basos: 1 %
EOS (ABSOLUTE): 0.1 x10E3/uL (ref 0.0–0.4)
Eos: 2 %
Hematocrit: 41 % (ref 34.0–46.6)
Hemoglobin: 12.9 g/dL (ref 11.1–15.9)
Immature Grans (Abs): 0 x10E3/uL (ref 0.0–0.1)
Immature Granulocytes: 0 %
Lymphocytes Absolute: 2 x10E3/uL (ref 0.7–3.1)
Lymphs: 24 %
MCH: 30.5 pg (ref 26.6–33.0)
MCHC: 31.5 g/dL (ref 31.5–35.7)
MCV: 97 fL (ref 79–97)
Monocytes Absolute: 0.5 x10E3/uL (ref 0.1–0.9)
Monocytes: 6 %
Neutrophils Absolute: 5.7 x10E3/uL (ref 1.4–7.0)
Neutrophils: 67 %
Platelets: 333 x10E3/uL (ref 150–450)
RBC: 4.23 x10E6/uL (ref 3.77–5.28)
RDW: 12.8 % (ref 11.7–15.4)
WBC: 8.4 x10E3/uL (ref 3.4–10.8)

## 2024-02-20 LAB — LIPID PANEL
Chol/HDL Ratio: 4.1 ratio (ref 0.0–4.4)
Cholesterol, Total: 199 mg/dL (ref 100–199)
HDL: 48 mg/dL (ref >39)
LDL Chol Calc (NIH): 122 mg/dL — ABNORMAL HIGH (ref 0–99)
Triglycerides: 164 mg/dL — ABNORMAL HIGH (ref 0–149)
VLDL Cholesterol Cal: 29 mg/dL (ref 5–40)

## 2024-02-20 LAB — IRON,TIBC AND FERRITIN PANEL
Ferritin: 153 ng/mL — ABNORMAL HIGH (ref 15–150)
Iron Saturation: 12 % — ABNORMAL LOW (ref 15–55)
Iron: 40 ug/dL (ref 27–159)
Total Iron Binding Capacity: 327 ug/dL (ref 250–450)
UIBC: 287 ug/dL (ref 131–425)

## 2024-02-20 LAB — COMPREHENSIVE METABOLIC PANEL WITH GFR
ALT: 6 IU/L (ref 0–32)
AST: 10 IU/L (ref 0–40)
Albumin: 4.1 g/dL (ref 3.8–4.9)
Alkaline Phosphatase: 39 IU/L — ABNORMAL LOW (ref 49–135)
BUN/Creatinine Ratio: 14 (ref 9–23)
BUN: 10 mg/dL (ref 6–24)
Bilirubin Total: <0.2 mg/dL (ref 0.0–1.2)
CO2: 26 mmol/L (ref 20–29)
Calcium: 9.3 mg/dL (ref 8.7–10.2)
Chloride: 101 mmol/L (ref 96–106)
Creatinine, Ser: 0.74 mg/dL (ref 0.57–1.00)
Globulin, Total: 2.2 g/dL (ref 1.5–4.5)
Glucose: 79 mg/dL (ref 70–99)
Potassium: 4.3 mmol/L (ref 3.5–5.2)
Sodium: 139 mmol/L (ref 134–144)
Total Protein: 6.3 g/dL (ref 6.0–8.5)
eGFR: 96 mL/min/1.73 (ref >59)

## 2024-02-20 LAB — TSH: TSH: 2 u[IU]/mL (ref 0.450–4.500)

## 2024-02-24 ENCOUNTER — Ambulatory Visit

## 2024-03-03 ENCOUNTER — Encounter

## 2024-03-09 LAB — HM MAMMOGRAPHY

## 2024-03-10 ENCOUNTER — Encounter: Payer: Self-pay | Admitting: Urology

## 2024-03-18 ENCOUNTER — Encounter: Payer: Self-pay | Admitting: Internal Medicine

## 2024-03-18 ENCOUNTER — Encounter: Admitting: Internal Medicine

## 2024-03-26 NOTE — Progress Notes (Signed)
 Plastic and Reconstructive Surgery  New Patient Consultation Note   Chief Complaint: Breast asymmetry   Interval History: Ms. Zollinger is seen today for pre-operative H&P in advance of upcoming scheduled bilateral breast scar revision, revision left breast reduction for asymmetry following breast reconstruction.  She reports no recent changes to her medical status since last visit.  She denies any recent illnesses, new medications/allergies, hospitalizations, or smoking/nicotine use.  She denies any new or recent fever, chills, infection, shortness of air, chest pain, palpitations, dizziness, LOC, cough, or other concerning symptoms.   I have reviewed the results of recent mammogram which was benign.    HPI from initial consultation:   Ms. Vader is a 53 y.o. female who is a patient of Dr. Leora and presents for evaluation of breast asymmetry.  She previously had a right sided breast cancer treated years ago with lumpectomy and radiation.  She developed subsequent tightening of the right breast soft tissue envelope and asymmetry relative to the left, non-radiated breast.    She has a history of macromastia, currently large D cup on the left but nearly a cup size smaller on the right.  She ultimately underwent bilateral breast reduction with Dr. Leora in attempt to improve symmetry.  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  She has since had normal screening mammograms at recommended intervals.  Despite significant reduction of the left breast to overcome asymmetry, she gradually developed enlarging of the left breast and has noted persistent asymmetry with time.  She desires improved symmetry.  She previously discussed revision with Dr. Leora who recommended additional left breast reduction.  She is consulting with me today since Dr. Leora will be retiring in the coming months.   She is due for mammogram in the coming month or so.   She is otherwise healthy.  Denies chronic medical conditions including cardiovascular disease, COPD, kidney or liver disease, DVT/PE, bleeding or clotting disorders, autoimmune disease.  She has a history of migraines which are well-controlled.  She does not use nicotine or illicit drugs.  She is a runner, broadcasting/film/video and is hoping to have surgery around the holiday (December 2025).  She additionally points out lateral chest excess/axillary soft tissue excess bilaterally which she would like to have addressed if possible.    Past Medical History:  Medical History[1]  Past Surgical History: Surgical History[2]  Social History: Social History   Tobacco Use  . Smoking status: Former  . Smokeless tobacco: Never  Substance Use Topics  . Alcohol use: Yes    Family History: family history is not on file.  Allergies: Allergies[3]  Medications: has a current medication list which includes the following prescription(s): asenapine maleate, cholecalciferol, cyanocobalamin (vitamin b-12), dextroamphetamine -amphetamine , duloxetine , ginkgo biloba, glutamine, melatonin, meloxicam , metformin , ozempic , natural vegetable, sulfamethoxazole -trimethoprim , and tiagabine.  Review of Systems: A complete ROS was performed with pertinent positives/negatives noted in the HPI. The remainder of the ROS are negative.  Physical Examination:  height is 1.676 m (5' 6) and weight is 73.7 kg (162 lb 6.4 oz). Her temporal temperature is 97.7 F (36.5 C). Her blood pressure is 116/84 and her pulse is 68.  Body mass index is 26.21 kg/m.  Gen: well nourished appears stated age Psych: Normal behavior and affect HEENT: Normocephalic.  Resp: normal respiratory effort on room air  Physical Exam Breast: Reexamined today -breast position and NAC position is satisfactory, but the left breast is noticeably larger in clothing and on manual  exam.  Estimate 200 to 400 g discrepancy between the left and right breast.  Lateral  extension of bilateral Wise pattern breast reduction has very mild contour irregularities, and she does have lateral lipodystrophy which she desires to be excised with scar revision.  She asked that I examine her abdomen as well and is considering the option of cosmetic liposuction.  Her abdomen is status post abdominoplasty.  She has a thick subcutaneous layer with pinch that is approximately 4 to 5 cm.  She has lost weight recently and does have a mild amount of skin excess.  Abdominal wall laxity is minimal.  No obvious diastases.  No hernia.  Well-healed lower abdominal and periumbilical scars from prior abdominoplasty.    Extremities: no clubbing or cyanosis  Skin: No suspicious lesions, moderate skin quality.     Assessment and Plan: 53 y.o. female with history of right breast oncoplastic reduction and left breast reduction for symmetry following treatment of right breast cancer.  She presents with breast asymmetry desiring revision.  She is here today for preoperative H&P in advance of scheduled bilateral breast scar revision and additional left breast reduction for symmetry.  I reviewed indications, risk, benefits, alternatives to surgery.  I reviewed the proposed surgical plan which would involve additional resection of volume on the left via her existing Wise pattern scars.  I will extend the lateral aspect of the horizontal limbs to excise minor standing cones and lateral chest skin excess.  I discussed outpatient setting, possible need for drain on the left, need for postoperative compression, restrictions/precautions after surgery, and expected recovery.  She expressed understanding.  I reviewed risks including pain, bleeding, scarring, seroma, NAC malposition, soft tissue or nipple necrosis, sensory changes to the nipple, failure to achieve desired result, persistent asymmetry, deformity, disability.  Reviewed risks of general anesthesia including DVT/PE, MI, stroke,  death.  Additionally I discussed the option of liposuction for her abdomen.  I proposed abdominal and flank liposuction to improve contour and thin the abdominal apron.  Discussed this will result in some degree of tightening but may leave persistent skin excess, that would be my preference to excise extra skin in staged fashion.  If minor, this could be done under local versus in the operating room.  I would wait 3 to 6 months before excising any skin excess.  I discussed surgical approach with liposuction, risks including contour irregularity, deformity, pain, bleeding, scarring, failure to achieve desired result.  I will have schedulers discuss cosmetic quote with her today should she desire to have this done at the time of her breast surgery.  Informed consent obtained.  I answered all questions to her satisfaction.  Will proceed as planned  She desires additional left breast reduction to match the breast on the right.  I anticipate with the amount of breast tissue that she would require at least 300 g resection including skin and soft tissue to achieve symmetry with the right breast.  I counseled her that, even so, the right breast soft tissue always be tighter and likely sit higher on the chest wall due to history of radiation.  The goal is to achieve symmetric fit in a bra.  When naked, the left breast will likely always appear slightly less 'lifted' than the right.  She expressed understanding.  I discussed that I could excise the lateral aspect of her scars including some preaxillary tissue excess to improve lateral breast contour bilaterally at the time of left breast reduction.  I discussed  surgical approach, scar patterns, perioperative course, postoperative restrictions/precautions, possible need for drains, expected recovery.  I reviewed risks and benefits, indications and alternatives to the proposed treatment.  Specific risks were discussed with her including NAC necrosis, bleeding, pain,  seroma, persistent asymmetry, deformity, infection, numbness, damage to surrounding structures, need for future revisionary surgery, nipple necrosis/death, wound formation, possible need for local wound care.  She expressed understanding.  Photos were obtained today.  Will submit for insurance coverage of left breast revision, bilateral Wise pattern scar revision as an outpatient.  I will see her back for preoperative H&P prior to surgery.  I answered all questions to her satisfaction.            [1] Past Medical History: Diagnosis Date  . Anxiety   . Breast disorder   . Cancer    (CMD)    right breast  . Depression   [2] Past Surgical History: Procedure Laterality Date  . BREAST SURGERY     Procedure: BREAST SURGERY; lumpectomy  . CHOLECYSTECTOMY     Procedure: CHOLECYSTECTOMY  . COLON SURGERY     Procedure: COLON SURGERY  . HYSTERECTOMY      Procedure: HYSTERECTOMY  . WISDOM TOOTH EXTRACTION     Procedure: WISDOM TOOTH EXTRACTION  [3] Allergies Allergen Reactions  . Amoxicillin-Pot Clavulanate GI Intolerance  . Codeine Hives and Other (See Comments)    Other reaction(s): Other (See Comments)  hallucinations  hallucinations  hallucinations  hallucinations  hallucinations  hallucinations  . Morphine Hives and Other (See Comments)

## 2024-03-28 NOTE — Progress Notes (Unsigned)
 03/30/2024 9:45 AM   Gwendolyn [name] [birth year] [id number]  Referring provider: Marylynn Verneita CROME, MD [address] [place],  [place] [address]  Urological history: 1. High risk hematuria - smoker - history of thin basement membranes per renal biopsy  - CTU (2019) - cysto (2019) NED   2. rUTI's - December 24, 2023 - no growth - December 11, 2023 - <10,000 colonies - October 22, 2023 - no growth - October 17, 2023 - no growth - Sep 30, 2023 - E.coli  - Sep 02, 2023 - E.coli - July 29, 2023 - E.coli  Chief Complaint  Patient presents with   Hematuria    HPI: Gwendolyn [name] is a 53 y.o. woman who presents today for microscopic hematuria.    Previous records reviewed.  She was seen by Dr. Francisca in 2021 for recurrent UTIs and microscopic hematuria.  Prior microscopic hematuria workup included CT urogram and cystoscopy--both negative.  Kidney biopsy performed by nephrology demonstrated thin basement membranes, felt to explain persistent microscopic hematuria.  Recurrent UTIs at that time were temporally associated with a new sexual partner and ultimately resolved with cranberry prophylaxis and trimethoprim  prophylaxis. Past medical history notable for breast cancer. She then returned to see Dr. Francisca on 12/24/2023.  In May and June 2025, she experienced recurrent symptomatic UTIs with E. coli, treated with antibiotics with initial improvement; however, symptoms recurred.  Recent urine cultures have been negative despite persistent lower urinary tract symptoms.  PCP recommended CT imaging, but patient deferred due to cost concerns.  She was having ongoing dysuria, urgency, and frequency. Denies gross hematuria. States symptoms feel similar to prior UTIs. Notes partial improvement with recent antibiotic courses.  Dr. Francisca discussed possible etiologies including recurrent UTI with incomplete treatment, nephrolithiasis, malignancy at that visit.  She was started  on cefpodoxime  100 mg twice daily x 7 days, then nitrofurantoin  load daily prophylaxis x 90 days, a strong recommendation was made for  CT/cystoscopy if persistent symptoms, but patient deferred and would like to trial above strategy.  Willing to consider CT/cystoscopy if no improvement with above.  Urine culture was negative and atypical cultures were negative.   She has 1-7 daytime voids, 1-2 episodes of nocturia with strong urge to urinate.  She does not have urinary leakage.  She does not wear any protective articles.  She does not limit fluid intake.  She does engage in toilet mapping.  She is still having issues with urinary urgency.  She is unable to hold her urine as long as she had been in the past.  Patient denies any modifying or aggravating factors.  Patient denies any recent UTI's, gross hematuria, dysuria or suprapubic/flank pain.  Patient denies any fevers, chills, nausea or vomiting.    UA yellow clear, specific gravity 1.020, pH 6.5, 1+ heme, 0-5 WBCs, 11-30 RBCs, 0-10 epithelial cells, mucus threads present and many bacteria.  PVR 17 mL   Serum creatinine (01/2024) 0.74   PMH: Past Medical History:  Diagnosis Date   Anal fissure    Anxiety    Arthritis    Breast cancer (HCC) 2002   lumpectomy-Right   Depression    Family history of brain cancer    Family history of colon cancer    Fibromyalgia    Gallstones    GERD (gastroesophageal reflux disease)    Irritable bowel syndrome    Motion sickness    cars, boats   Personal history of radiation therapy    Yeast  vaginitis     Surgical History: Past Surgical History:  Procedure Laterality Date   ABDOMINAL HYSTERECTOMY     BREAST BIOPSY Right 2002   Breast cancer DCIS   BREAST BIOPSY     BREAST LUMPECTOMY Right 2003   Radiatiomn Therapy DCIS   CHOLECYSTECTOMY  2000   COLECTOMY  10/31/2012   SECONDARY TO COLONIC INERTIA   COSMETIC SURGERY  November 13, 2018   breast reduction   ESOPHAGOGASTRODUODENOSCOPY (EGD) WITH  PROPOFOL  N/A 05/11/2020   Procedure: ESOPHAGOGASTRODUODENOSCOPY (EGD) WITH PROPOFOL ;  Surgeon: Janalyn Keene NOVAK, MD;  Location: [facility] ENDOSCOPY;  Service: Endoscopy;  Laterality: N/A;   REDUCTION MAMMAPLASTY Bilateral 11/13/2018   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 during breast reduction   SHOULDER ARTHROSCOPY WITH DISTAL CLAVICLE RESECTION Right 11/08/2020   Procedure: RIGHT SHOULDER ARTHROSCOPY, ARTHROSCOPIC DISTAL CLAVICLE EXCISION;  Surgeon: Addie Cordella Hamilton, MD;  Location[facility];  Service: Orthopedics;  Laterality: Right;   SUPRACERVICAL ABDOMINAL HYSTERECTOMY  04/10/2011   But still has ovaries.    Home Medications:  Allergies as of 03/30/2024       Reactions   Amoxicillin-pot Clavulanate Nausea Only   Ciprofloxacin     Leg aches   Morphine Diarrhea, Nausea Only   Penicillins Diarrhea, Nausea Only        Medication List        Accurate as of March 30, 2024  9:45 AM. If you have any questions, ask your nurse or doctor.          amphetamine -dextroamphetamine  30 MG tablet Commonly known as: ADDERALL Take 1 tablet by mouth 2 (two) times daily.   APPLE CIDER VINEGAR PO Take 1,600 mg by mouth daily.   asenapine 5 MG Subl 24 hr tablet Commonly known as: SAPHRIS 5 mg at bedtime.   ASHWAGANDHA PO Take 3,000 mg by mouth at bedtime.   clotrimazole -betamethasone  cream Commonly known as: LOTRISONE  Apply 1 Application topically 2 (two) times daily.   CRANBERRY PO Take 30,000 mg by mouth at bedtime.   DULoxetine  HCl 40 MG Cpep Take 80 mg by mouth every morning.   FIBER PO Take 6 tablets by mouth in the morning and at bedtime.   GINKGO BILOBA PO Take 3,000 mg by mouth in the morning and at bedtime.   IRON PO Take 25 mg by mouth daily.   Melatonin 10 MG Tabs Take 10 mg by mouth daily.   meloxicam  15 MG tablet Commonly known as: MOBIC  TAKE 1 TABLET (15 MG TOTAL) BY MOUTH DAILY. AS NEEDED FOR JOINT PAIN   metFORMIN  500 MG 24 hr tablet Commonly known as:  GLUCOPHAGE -XR TAKE 1 TABLET BY MOUTH EVERY DAY WITH BREAKFAST   omeprazole  20 MG capsule Commonly known as: PRILOSEC TAKE 1 CAPSULE BY MOUTH EVERY DAY   ondansetron  4 MG tablet Commonly known as: Zofran  Take 1 tablet (4 mg total) by mouth every 8 (eight) hours as needed for nausea or vomiting.   PROBIOTIC PO Take 1 capsule by mouth daily.   Semaglutide  (2 MG/DOSE) 8 MG/3ML Sopn Inject 2 mg as directed once a week.   sertraline 25 MG tablet Commonly known as: ZOLOFT Take by mouth every morning.   tiaGABine 4 MG tablet Commonly known as: GABITRIL Take 4-16 mg by mouth See admin instructions. Take 4 mg in the morning and 16 mg at bedtime   Vitamin B-12 5000 MCG Tbdp Take 5,000 mcg by mouth daily.   Vitamin D  50 MCG (2000 UT) tablet Take 2,000 Units by mouth daily.  Allergies:  Allergies  Allergen Reactions   Amoxicillin-Pot Clavulanate Nausea Only   Ciprofloxacin      Leg aches   Morphine Diarrhea and Nausea Only   Penicillins Diarrhea and Nausea Only    Family History: Family History  Problem Relation Age of Onset   Meniere's disease Father    Alcohol abuse Sister    Heart disease Maternal Grandmother    Heart disease Maternal Grandfather    Brain cancer Maternal Grandfather    Heart disease Paternal Grandmother    Heart disease Paternal Grandfather    Rectal cancer Other        MGF's mother   Colon cancer Neg Hx    Esophageal cancer Neg Hx    Breast cancer Neg Hx     Social History:  reports that she has been smoking cigarettes. She has a 10 pack-year smoking history. She has never used smokeless tobacco. She reports current alcohol use. She reports that she does not use drugs.  ROS: Pertinent ROS in HPI  Physical Exam: BP 103/70 (BP Location: Left Arm, Patient Position: Sitting, Cuff Size: Normal)   Pulse 92   Wt 160 lb (72.6 kg)   SpO2 96%   BMI 25.82 kg/m   Constitutional:  Well nourished. Alert and oriented, No acute distress. HEENT:  [place] AT, moist mucus membranes.  Trachea midline Cardiovascular: No clubbing, cyanosis, or edema. Respiratory: Normal respiratory effort, no increased work of breathing. Neurologic: Grossly intact, no focal deficits, moving all 4 extremities. Psychiatric: Normal mood and affect.    Laboratory Data: See Epic and HPI   I have reviewed the labs.   Pertinent Imaging: 03/30/24 09:31 Scan Result: 17mL   Assessment and Plan:  1. Recurrent lower urinary tract symptoms -  History of culture-proven E. coli UTIs earlier this year - cystoscopy pending  2. Microscopic hematuria - Prior complete workup negative (CT urogram, cystoscopy). 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 - UA w/ micro heme  -We reviewed that hematuria (blood in the urine) may result from a variety of causes, including nephrolithiasis (kidney stones), urinary tract infections (UTIs), trauma or structural abnormalities of the urinary tract, and malignancy. It was noted that in some cases, no definitive etiology is identified despite thorough evaluation and because she continues to have microscopic hematuria and persistent lower urinary tract symptoms, I have advised her to proceed with a CT urogram and cystoscopy and she is in agreement -We discussed that a CT urogram involves intravenous administration of contrast material to enhance imaging of the urinary tract. Although rare, contrast reactions may occur, with severe reactions estimated at approximately 1 in 100,000. The patient denies any known allergies to iodinated contrast, iodine , or seafood, and is not currently taking metformin .  -Following imaging, cystoscopy will be performed. This procedure involves passing a small camera through the urethra into the bladder after administration of topical lidocaine  for local anesthesia. Post-procedural symptoms may include mild hematuria and dysuria, typically resolving within 24 to 48 hours. -The patient  was given the opportunity to ask questions, which were addressed in detail. After reviewing the risks, benefits, and rationale for evaluation, the patient expressed understanding and agreed to proceed. Orders Placed: CT urogram, cystoscopy, Urinalysis (UA), and Urine culture  Plan: The patient will return for follow-up after completion of the above studies to review findings and determine next steps.  Return for CT Urogram report and cystoscopy for micro heme and LUTS .  These notes generated with voice recognition software. I  apologize for typographical errors.  Gwendolyn [name]  [facility] [address]  [address] [place], [place] [address] [phone]

## 2024-03-30 ENCOUNTER — Encounter: Payer: Self-pay | Admitting: Urology

## 2024-03-30 ENCOUNTER — Ambulatory Visit: Admitting: Urology

## 2024-03-30 VITALS — BP 103/70 | HR 92 | Wt 160.0 lb

## 2024-03-30 DIAGNOSIS — R3129 Other microscopic hematuria: Secondary | ICD-10-CM

## 2024-03-30 DIAGNOSIS — R399 Unspecified symptoms and signs involving the genitourinary system: Secondary | ICD-10-CM | POA: Diagnosis not present

## 2024-03-30 LAB — BLADDER SCAN AMB NON-IMAGING

## 2024-03-30 NOTE — Patient Instructions (Signed)
 Please Call [phone] to schedule CT urogram

## 2024-03-31 ENCOUNTER — Encounter (HOSPITAL_BASED_OUTPATIENT_CLINIC_OR_DEPARTMENT_OTHER): Payer: Self-pay

## 2024-03-31 ENCOUNTER — Other Ambulatory Visit: Payer: Self-pay

## 2024-03-31 LAB — URINALYSIS, COMPLETE
Bilirubin, UA: NEGATIVE
Glucose, UA: NEGATIVE
Ketones, UA: NEGATIVE
Leukocytes,UA: NEGATIVE
Nitrite, UA: NEGATIVE
Protein,UA: NEGATIVE
Specific Gravity, UA: 1.02 (ref 1.005–1.030)
Urobilinogen, Ur: 0.2 mg/dL (ref 0.2–1.0)
pH, UA: 6.5 (ref 5.0–7.5)

## 2024-03-31 LAB — MICROSCOPIC EXAMINATION

## 2024-03-31 NOTE — Progress Notes (Signed)
   03/31/24 0947  PAT Phone Screen  Is the patient taking a GLP-1 receptor agonist? Yes  Has the patient been informed on holding medication? (S)  Yes (12/7 will hold dose this Sunday)  Do You Have Diabetes? (S)  No (takes metformin  for wt loss)  Do You Have Hypertension? No  Have You Ever Been to the ER for Asthma? No  Have You Taken Oral Steroids in the Past 3 Months? No  Do you Take Phenteramine or any Other Diet Drugs? No  Recent  Lab Work, EKG, CXR? No  Do you have a history of heart problems? No  Any Recent Hospitalizations? No  Height 5' 6 (1.676 m)  Weight 72.6 kg  Pat Appointment Scheduled No  Reason for No Appointment Not Needed

## 2024-04-03 NOTE — H&P (Signed)
 acob Carlin Shadow, MD Physician Specialty: Plastic Surgery   Progress Notes    Signed   Creation Time: 03/26/2024  9:58 AM Note Received: 04/03/2024  7:33 AM   Signed     Plastic and Reconstructive Surgery  New Patient Consultation Note    Chief Complaint: Breast asymmetry    Interval History: Gwendolyn [name] is seen today for pre-operative H&P in advance of upcoming scheduled bilateral breast scar revision, revision left breast reduction for asymmetry following breast reconstruction.  She reports no recent changes to her medical status since last visit.  She denies any recent illnesses, new medications/allergies, hospitalizations, or smoking/nicotine use.  She denies any new or recent fever, chills, infection, shortness of air, chest pain, palpitations, dizziness, LOC, cough, or other concerning symptoms.    I have reviewed the results of recent mammogram which was benign.       HPI from initial consultation:    Gwendolyn [name] is a 53 y.o. female who is a patient of Dr. Leora and presents for evaluation of breast asymmetry.  She previously had a right sided breast cancer treated years ago with lumpectomy and radiation.  She developed subsequent tightening of the right breast soft tissue envelope and asymmetry relative to the left, non-radiated breast.     She has a history of macromastia, currently large D cup on the left but nearly a cup size smaller on the right.  She ultimately underwent bilateral breast reduction with Dr. Leora in attempt to improve symmetry.  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  She has since had normal screening mammograms at recommended intervals.  Despite significant reduction of the left breast to overcome asymmetry, she gradually developed enlarging of the left breast and has noted persistent asymmetry with time.  She desires improved symmetry.  She previously discussed revision with Dr. Leora who  recommended additional left breast reduction.  She is consulting with me today since Dr. Leora will be retiring in the coming months.    She is due for mammogram in the coming month or so.  She is otherwise healthy.  Denies chronic medical conditions including cardiovascular disease, COPD, kidney or liver disease, DVT/PE, bleeding or clotting disorders, autoimmune disease.  She has a history of migraines which are well-controlled.  She does not use nicotine or illicit drugs.   She is a runner, broadcasting/film/video and is hoping to have surgery around the holiday (December 2025).  She additionally points out lateral chest excess/axillary soft tissue excess bilaterally which she would like to have addressed if possible.     Past Medical History:  [Medical History]  [Medical History] Past Medical History     Diagnosis Date   Anxiety     Breast disorder     Cancer    (CMD)      right breast   Depression       Past Surgical History: [Surgical History]  [Surgical History] Past Surgical History      Procedure Laterality Date   BREAST SURGERY        Procedure: BREAST SURGERY; lumpectomy   CHOLECYSTECTOMY        Procedure: CHOLECYSTECTOMY   COLON SURGERY        Procedure: COLON SURGERY   HYSTERECTOMY         Procedure: HYSTERECTOMY   WISDOM TOOTH EXTRACTION        Procedure: WISDOM TOOTH EXTRACTION     Social History: Social History  Tobacco Use   Smoking status: Former   Smokeless tobacco: Never  Substance Use Topics   Alcohol use: Yes      Family History: family history is not on file.   Allergies: [Allergies]  [Allergies]      Allergen Reactions   Amoxicillin-Pot Clavulanate GI Intolerance   Codeine Hives and Other (See Comments)      Other reaction(s): Other (See Comments)  hallucinations  hallucinations  hallucinations   hallucinations  hallucinations   hallucinations   Morphine Hives and Other (See Comments)     Medications: has a current medication list  which includes the following prescription(s): asenapine maleate, cholecalciferol, cyanocobalamin (vitamin b-12), dextroamphetamine -amphetamine , duloxetine , ginkgo biloba, glutamine, melatonin, meloxicam , metformin , ozempic , natural vegetable, sulfamethoxazole -trimethoprim , and tiagabine.   Review of Systems: A complete ROS was performed with pertinent positives/negatives noted in the HPI. The remainder of the ROS are negative.   Physical Examination:  height is 1.676 m (5' 6) and weight is 73.7 kg (162 lb 6.4 oz). Her temporal temperature is 97.7 F (36.5 C). Her blood pressure is 116/84 and her pulse is 68.  Body mass index is 26.21 kg/m.   Gen: well nourished appears stated age Psych: Normal behavior and affect HEENT: Normocephalic.  Resp: normal respiratory effort on room air  Physical Exam Breast: Reexamined today -breast position and NAC position is satisfactory, but the left breast is noticeably larger in clothing and on manual exam.  Estimate 200 to 400 g discrepancy between the left and right breast.   Lateral extension of bilateral Wise pattern breast reduction has very mild contour irregularities, and she does have lateral lipodystrophy which she desires to be excised with scar revision.   She asked that I examine her abdomen as well and is considering the option of cosmetic liposuction.  Her abdomen is status post abdominoplasty.  She has a thick subcutaneous layer with pinch that is approximately 4 to 5 cm.  She has lost weight recently and does have a mild amount of skin excess.  Abdominal wall laxity is minimal.  No obvious diastases.  No hernia.  Well-healed lower abdominal and periumbilical scars from prior abdominoplasty.     Extremities: no clubbing or cyanosis  Skin: No suspicious lesions, moderate skin quality.       Assessment and Plan: 53 y.o. female with history of right breast oncoplastic reduction and left breast reduction for symmetry following treatment of  right breast cancer.  She presents with breast asymmetry desiring revision.   She is here today for preoperative H&P in advance of scheduled bilateral breast scar revision and additional left breast reduction for symmetry.   I reviewed indications, risk, benefits, alternatives to surgery.  I reviewed the proposed surgical plan which would involve additional resection of volume on the left via her existing Wise pattern scars.  I will extend the lateral aspect of the horizontal limbs to excise minor standing cones and lateral chest skin excess.  I discussed outpatient setting, possible need for drain on the left, need for postoperative compression, restrictions/precautions after surgery, and expected recovery.  She expressed understanding.  I reviewed risks including pain, bleeding, scarring, seroma, NAC malposition, soft tissue or nipple necrosis, sensory changes to the nipple, failure to achieve desired result, persistent asymmetry, deformity, disability.  Reviewed risks of general anesthesia including DVT/PE, MI, stroke, death.   Additionally I discussed the option of liposuction for her abdomen.  I proposed abdominal and flank liposuction to improve contour and thin the abdominal apron.  Discussed this will result in some degree of tightening but may leave persistent skin excess, that would be my preference to excise extra skin in staged fashion.  If minor, this could be done under local versus in the operating room.  I would wait 3 to 6 months before excising any skin excess.  I discussed surgical approach with liposuction, risks including contour irregularity, deformity, pain, bleeding, scarring, failure to achieve desired result.  I will have schedulers discuss cosmetic quote with her today should she desire to have this done at the time of her breast surgery.   Informed consent obtained.  I answered all questions to her satisfaction.  Will proceed as planned   She desires additional left breast  reduction to match the breast on the right.  I anticipate with the amount of breast tissue that she would require at least 300 g resection including skin and soft tissue to achieve symmetry with the right breast.  I counseled her that, even so, the right breast soft tissue always be tighter and likely sit higher on the chest wall due to history of radiation.  The goal is to achieve symmetric fit in a bra.  When naked, the left breast will likely always appear slightly less 'lifted' than the right.  She expressed understanding.   I discussed that I could excise the lateral aspect of her scars including some preaxillary tissue excess to improve lateral breast contour bilaterally at the time of left breast reduction.  I discussed surgical approach, scar patterns, perioperative course, postoperative restrictions/precautions, possible need for drains, expected recovery.  I reviewed risks and benefits, indications and alternatives to the proposed treatment.  Specific risks were discussed with her including NAC necrosis, bleeding, pain, seroma, persistent asymmetry, deformity, infection, numbness, damage to surrounding structures, need for future revisionary surgery, nipple necrosis/death, wound formation, possible need for local wound care.  She expressed understanding.   Photos were obtained today.  Will submit for insurance coverage of left breast revision, bilateral Wise pattern scar revision as an outpatient.  I will see her back for preoperative H&P prior to surgery.  I answered all questions to her satisfaction.

## 2024-04-06 ENCOUNTER — Encounter (HOSPITAL_BASED_OUTPATIENT_CLINIC_OR_DEPARTMENT_OTHER): Payer: Self-pay

## 2024-04-06 ENCOUNTER — Encounter (HOSPITAL_BASED_OUTPATIENT_CLINIC_OR_DEPARTMENT_OTHER): Admission: RE | Disposition: A | Payer: Self-pay | Source: Home / Self Care

## 2024-04-06 ENCOUNTER — Other Ambulatory Visit: Payer: Self-pay

## 2024-04-06 ENCOUNTER — Ambulatory Visit (HOSPITAL_BASED_OUTPATIENT_CLINIC_OR_DEPARTMENT_OTHER): Admission: RE | Admit: 2024-04-06 | Discharge: 2024-04-06 | Disposition: A

## 2024-04-06 ENCOUNTER — Ambulatory Visit (HOSPITAL_BASED_OUTPATIENT_CLINIC_OR_DEPARTMENT_OTHER)

## 2024-04-06 DIAGNOSIS — N651 Disproportion of reconstructed breast: Secondary | ICD-10-CM

## 2024-04-06 DIAGNOSIS — Z853 Personal history of malignant neoplasm of breast: Secondary | ICD-10-CM | POA: Diagnosis not present

## 2024-04-06 DIAGNOSIS — N6489 Other specified disorders of breast: Secondary | ICD-10-CM | POA: Diagnosis not present

## 2024-04-06 DIAGNOSIS — K219 Gastro-esophageal reflux disease without esophagitis: Secondary | ICD-10-CM | POA: Insufficient documentation

## 2024-04-06 DIAGNOSIS — L905 Scar conditions and fibrosis of skin: Secondary | ICD-10-CM | POA: Diagnosis present

## 2024-04-06 DIAGNOSIS — N62 Hypertrophy of breast: Secondary | ICD-10-CM | POA: Insufficient documentation

## 2024-04-06 DIAGNOSIS — Z87891 Personal history of nicotine dependence: Secondary | ICD-10-CM | POA: Insufficient documentation

## 2024-04-06 DIAGNOSIS — N6032 Fibrosclerosis of left breast: Secondary | ICD-10-CM | POA: Diagnosis not present

## 2024-04-06 HISTORY — PX: SCAR REVISION: SHX5285

## 2024-04-06 HISTORY — PX: BREAST REDUCTION SURGERY: SHX8

## 2024-04-06 SURGERY — MAMMOPLASTY, REDUCTION
Anesthesia: General | Site: Breast | Laterality: Left

## 2024-04-06 MED ORDER — ONDANSETRON HCL 4 MG/2ML IJ SOLN: 4.0000 mg | Freq: Once | INTRAMUSCULAR | Status: DC | PRN

## 2024-04-06 MED ORDER — ONDANSETRON HCL 4 MG/2ML IJ SOLN
INTRAMUSCULAR | Status: AC
  Filled 2024-04-06: qty 2

## 2024-04-06 MED ORDER — SCOPOLAMINE 1 MG/3DAYS TD PT72
1.0000 | MEDICATED_PATCH | TRANSDERMAL | Status: DC
  Administered 2024-04-06: 12:00:00 1 via TRANSDERMAL

## 2024-04-06 MED ORDER — FENTANYL CITRATE (PF) 100 MCG/2ML IJ SOLN
INTRAMUSCULAR | Status: AC
  Filled 2024-04-06: qty 2

## 2024-04-06 MED ORDER — MIDAZOLAM HCL 5 MG/5ML IJ SOLN
INTRAMUSCULAR | Status: DC | PRN
  Administered 2024-04-06: 12:00:00 2 mg via INTRAVENOUS

## 2024-04-06 MED ORDER — SCOPOLAMINE 1 MG/3DAYS TD PT72
MEDICATED_PATCH | TRANSDERMAL | Status: AC
  Filled 2024-04-06: qty 1

## 2024-04-06 MED ORDER — FENTANYL CITRATE (PF) 100 MCG/2ML IJ SOLN
25.0000 ug | INTRAMUSCULAR | Status: DC | PRN
  Administered 2024-04-06 (×2): 50 ug via INTRAVENOUS

## 2024-04-06 MED ORDER — DEXMEDETOMIDINE HCL IN NACL 80 MCG/20ML IV SOLN
INTRAVENOUS | Status: DC | PRN
  Administered 2024-04-06: 14:00:00 12 ug via INTRAVENOUS

## 2024-04-06 MED ORDER — FENTANYL CITRATE (PF) 100 MCG/2ML IJ SOLN
INTRAMUSCULAR | Status: DC | PRN
  Administered 2024-04-06 (×5): 50 ug via INTRAVENOUS

## 2024-04-06 MED ORDER — ACETAMINOPHEN 10 MG/ML IV SOLN
INTRAVENOUS | Status: AC
  Filled 2024-04-06: qty 100

## 2024-04-06 MED ORDER — PROPOFOL 500 MG/50ML IV EMUL
INTRAVENOUS | Status: DC | PRN
  Administered 2024-04-06: 13:00:00 25 ug/kg/min via INTRAVENOUS

## 2024-04-06 MED ORDER — DEXAMETHASONE SODIUM PHOSPHATE 4 MG/ML IJ SOLN
INTRAMUSCULAR | Status: DC | PRN
  Administered 2024-04-06: 13:00:00 8 mg via INTRAVENOUS

## 2024-04-06 MED ORDER — OXYCODONE HCL 5 MG PO TABS: 5.0000 mg | ORAL_TABLET | Freq: Three times a day (TID) | ORAL | 0 refills | Status: DC | PRN

## 2024-04-06 MED ORDER — ARTIFICIAL TEARS OPHTHALMIC OINT
TOPICAL_OINTMENT | OPHTHALMIC | Status: AC
  Filled 2024-04-06: qty 7

## 2024-04-06 MED ORDER — OXYCODONE HCL 5 MG/5ML PO SOLN: 5.0000 mg | Freq: Once | ORAL | Status: AC | PRN

## 2024-04-06 MED ORDER — CHLORHEXIDINE GLUCONATE CLOTH 2 % EX PADS: 6.0000 | MEDICATED_PAD | Freq: Once | CUTANEOUS | Status: DC

## 2024-04-06 MED ORDER — LIDOCAINE 2% (20 MG/ML) 5 ML SYRINGE
INTRAMUSCULAR | Status: AC
  Filled 2024-04-06: qty 5

## 2024-04-06 MED ORDER — PROPOFOL 10 MG/ML IV BOLUS
INTRAVENOUS | Status: AC
  Filled 2024-04-06: qty 20

## 2024-04-06 MED ORDER — OXYCODONE HCL 5 MG PO TABS
5.0000 mg | ORAL_TABLET | Freq: Once | ORAL | Status: AC | PRN
  Administered 2024-04-06: 16:00:00 5 mg via ORAL

## 2024-04-06 MED ORDER — OXYCODONE HCL 5 MG PO TABS
ORAL_TABLET | ORAL | Status: AC
  Filled 2024-04-06: qty 1

## 2024-04-06 MED ORDER — LACTATED RINGERS IV SOLN: INTRAVENOUS | Status: DC

## 2024-04-06 MED ORDER — LIDOCAINE HCL (CARDIAC) PF 100 MG/5ML IV SOSY
PREFILLED_SYRINGE | INTRAVENOUS | Status: DC | PRN
  Administered 2024-04-06: 12:00:00 100 mg via INTRAVENOUS

## 2024-04-06 MED ORDER — PROPOFOL 10 MG/ML IV BOLUS
INTRAVENOUS | Status: DC | PRN
  Administered 2024-04-06: 13:00:00 20 mg via INTRAVENOUS
  Administered 2024-04-06: 13:00:00 100 mg via INTRAVENOUS
  Administered 2024-04-06: 13:00:00 30 mg via INTRAVENOUS

## 2024-04-06 MED ORDER — 0.9 % SODIUM CHLORIDE (POUR BTL) OPTIME
TOPICAL | Status: DC | PRN
  Administered 2024-04-06: 14:00:00 1000 mL

## 2024-04-06 MED ORDER — ONDANSETRON HCL 4 MG/2ML IJ SOLN
INTRAMUSCULAR | Status: DC | PRN
  Administered 2024-04-06: 14:00:00 4 mg via INTRAVENOUS

## 2024-04-06 MED ORDER — SUGAMMADEX SODIUM 200 MG/2ML IV SOLN
INTRAVENOUS | Status: DC | PRN
  Administered 2024-04-06: 15:00:00 200 mg via INTRAVENOUS

## 2024-04-06 MED ORDER — CEFAZOLIN SODIUM-DEXTROSE 2-4 GM/100ML-% IV SOLN
2.0000 g | INTRAVENOUS | Status: AC
  Administered 2024-04-06: 13:00:00 2 g via INTRAVENOUS

## 2024-04-06 MED ORDER — PHENYLEPHRINE HCL (PRESSORS) 10 MG/ML IV SOLN
INTRAVENOUS | Status: DC | PRN
  Administered 2024-04-06 (×3): 80 ug via INTRAVENOUS
  Administered 2024-04-06: 13:00:00 40 ug via INTRAVENOUS

## 2024-04-06 MED ORDER — ACETAMINOPHEN 10 MG/ML IV SOLN
1000.0000 mg | Freq: Once | INTRAVENOUS | Status: DC | PRN
  Administered 2024-04-06: 15:00:00 1000 mg via INTRAVENOUS

## 2024-04-06 MED ORDER — MIDAZOLAM HCL 2 MG/2ML IJ SOLN
INTRAMUSCULAR | Status: AC
  Filled 2024-04-06: qty 2

## 2024-04-06 MED ORDER — AMISULPRIDE (ANTIEMETIC) 5 MG/2ML IV SOLN: 10.0000 mg | Freq: Once | INTRAVENOUS | Status: DC | PRN

## 2024-04-06 MED ORDER — ROCURONIUM BROMIDE 100 MG/10ML IV SOLN
INTRAVENOUS | Status: DC | PRN
  Administered 2024-04-06: 13:00:00 10 mg via INTRAVENOUS
  Administered 2024-04-06: 13:00:00 50 mg via INTRAVENOUS

## 2024-04-06 MED ORDER — BUPIVACAINE-EPINEPHRINE (PF) 0.25% -1:200000 IJ SOLN
INTRAMUSCULAR | Status: DC | PRN
  Administered 2024-04-06: 15:00:00 50 mL

## 2024-04-06 SURGICAL SUPPLY — 59 items
BAG DECANTER FOR FLEXI CONT (MISCELLANEOUS) ×2 IMPLANT
BINDER BREAST 3XL (GAUZE/BANDAGES/DRESSINGS) IMPLANT
BINDER BREAST LRG (GAUZE/BANDAGES/DRESSINGS) IMPLANT
BINDER BREAST MEDIUM (GAUZE/BANDAGES/DRESSINGS) IMPLANT
BINDER BREAST XLRG (GAUZE/BANDAGES/DRESSINGS) IMPLANT
BINDER BREAST XXLRG (GAUZE/BANDAGES/DRESSINGS) IMPLANT
BIOPATCH RED 1 DISK 7.0 (GAUZE/BANDAGES/DRESSINGS) IMPLANT
BLADE SURG 10 STRL SS (BLADE) ×12 IMPLANT
CHLORAPREP W/TINT 26 (MISCELLANEOUS) ×4 IMPLANT
COVER BACK TABLE 60X90IN (DRAPES) ×2 IMPLANT
COVER MAYO STAND STRL (DRAPES) ×2 IMPLANT
DERMABOND ADVANCED .7 DNX12 (GAUZE/BANDAGES/DRESSINGS) ×4 IMPLANT
DRAIN CHANNEL 15F RND FF W/TCR (WOUND CARE) IMPLANT
DRAIN CHANNEL 19F RND (DRAIN) IMPLANT
DRAPE TOP ARMCOVERS (MISCELLANEOUS) ×2 IMPLANT
DRAPE UTILITY XL STRL (DRAPES) ×2 IMPLANT
DRSG TEGADERM 4X4.75 (GAUZE/BANDAGES/DRESSINGS) IMPLANT
ELECTRODE BLDE 4.0 EZ CLN MEGD (MISCELLANEOUS) ×2 IMPLANT
ELECTRODE REM PT RTRN 9FT ADLT (ELECTROSURGICAL) ×2 IMPLANT
EVACUATOR SILICONE 100CC (DRAIN) IMPLANT
GAUZE PAD ABD 8X10 STRL (GAUZE/BANDAGES/DRESSINGS) ×4 IMPLANT
GLOVE BIO SURGEON STRL SZ7.5 (GLOVE) ×4 IMPLANT
GLOVE BIOGEL PI IND STRL 8 (GLOVE) ×4 IMPLANT
GOWN STRL REUS W/ TWL LRG LVL3 (GOWN DISPOSABLE) ×2 IMPLANT
GOWN STRL REUS W/ TWL XL LVL3 (GOWN DISPOSABLE) ×2 IMPLANT
MANIFOLD NEPTUNE II (INSTRUMENTS) ×2 IMPLANT
MARKER SKIN DUAL TIP RULER LAB (MISCELLANEOUS) IMPLANT
NDL HYPO 22X1.5 SAFETY MO (MISCELLANEOUS) ×4 IMPLANT
NDL HYPO 25X1 1.5 SAFETY (NEEDLE) IMPLANT
NDL SPNL 18GX3.5 QUINCKE PK (NEEDLE) ×2 IMPLANT
NEEDLE HYPO 22X1.5 SAFETY MO (MISCELLANEOUS) ×4 IMPLANT
NEEDLE HYPO 25X1 1.5 SAFETY (NEEDLE) IMPLANT
NEEDLE SPNL 18GX3.5 QUINCKE PK (NEEDLE) ×2 IMPLANT
PACK BASIN DAY SURGERY FS (CUSTOM PROCEDURE TRAY) ×2 IMPLANT
PACK UNIVERSAL I (CUSTOM PROCEDURE TRAY) ×2 IMPLANT
PENCIL SMOKE EVACUATOR (MISCELLANEOUS) ×2 IMPLANT
PIN SAFETY STERILE (MISCELLANEOUS) IMPLANT
SLEEVE SCD COMPRESS KNEE MED (STOCKING) ×2 IMPLANT
SOLN 0.9% NACL POUR BTL 1000ML (IV SOLUTION) ×2 IMPLANT
SPONGE T-LAP 18X18 ~~LOC~~+RFID (SPONGE) ×6 IMPLANT
STAPLER SKIN PROX WIDE 3.9 (STAPLE) ×2 IMPLANT
STRIP SUTURE WOUND CLOSURE 1/2 (MISCELLANEOUS) ×4 IMPLANT
SUT MNCRL AB 3-0 PS2 27 (SUTURE) ×8 IMPLANT
SUT MNCRL AB 4-0 PS2 18 (SUTURE) IMPLANT
SUT PLAIN GUT FAST 5-0 (SUTURE) IMPLANT
SUT SILK 2 0 PERMA HAND 18 BK (SUTURE) IMPLANT
SUT STRATAFIX SPIRAL PDS+ 0 30 (SUTURE) ×4 IMPLANT
SUT VIC AB 2-0 SH 27XBRD (SUTURE) IMPLANT
SUT VIC AB 3-0 PS1 18XBRD (SUTURE) IMPLANT
SUT VIC AB 4-0 PS2 18 (SUTURE) IMPLANT
SUTURE STRATFX 0 PDS 27 VIOLET (SUTURE) IMPLANT
SYR 20ML LL LF (SYRINGE) ×4 IMPLANT
SYR 50ML LL SCALE MARK (SYRINGE) ×4 IMPLANT
SYR BULB IRRIG 60ML STRL (SYRINGE) ×2 IMPLANT
SYR CONTROL 10ML LL (SYRINGE) IMPLANT
TOWEL GREEN STERILE FF (TOWEL DISPOSABLE) ×4 IMPLANT
TUBE CONNECTING 20X1/4 (TUBING) ×2 IMPLANT
UNDERPAD 30X36 HEAVY ABSORB (UNDERPADS AND DIAPERS) ×4 IMPLANT
YANKAUER SUCT BULB TIP NO VENT (SUCTIONS) ×2 IMPLANT

## 2024-04-06 NOTE — Transfer of Care (Signed)
 Immediate Anesthesia Transfer of Care Note  Patient: Gwendolyn [name]  Procedure(s) Performed: MAMMOPLASTY, REDUCTION (Left: Breast) REVISION, SCAR (Bilateral: Breast)  Patient Location: PACU  Anesthesia Type:General  Level of Consciousness: awake and patient cooperative  Airway & Oxygen Therapy: Patient Spontanous Breathing and Patient connected to face mask oxygen  Post-op Assessment: Report given to RN and Post -op Vital signs reviewed and stable  Post vital signs: Reviewed and stable  Last Vitals:  Vitals Value Taken Time  BP 122/72 04/06/24 15:23  Temp 36.6 C 04/06/24 15:24  Pulse 116 04/06/24 15:26  Resp 22 04/06/24 15:26  SpO2 100 % 04/06/24 15:26  Vitals shown include unfiled device data.  Last Pain:  Vitals:   04/06/24 1108  TempSrc: Temporal  PainSc: 0-No pain      Patients Stated Pain Goal: 3 (04/06/24 1108)  Complications: No notable events documented.

## 2024-04-06 NOTE — Interval H&P Note (Signed)
 History and Physical Interval Note:  04/06/2024 12:27 PM  Gwendolyn [name]  has presented today for surgery, with the diagnosis of Breast asymmetry s/p breast reduction.  The various methods of treatment have been discussed with the patient and family. After consideration of risks, benefits and other options for treatment, the patient has consented to  Procedures with comments: MAMMOPLASTY, REDUCTION (Left) - *REVISION LEFT BREAST REDUCTION * REVISION, SCAR (Bilateral) - *BILATERAL SCAR REVISION as a surgical intervention.  The patient's history has been reviewed, patient examined, no change in status, stable for surgery.  I have reviewed the patient's chart and labs.  Questions were answered to the patient's satisfaction.     Bradd Merlos

## 2024-04-06 NOTE — Op Note (Signed)
 DATE:  04/06/2024   PRE-OP DIAGNOSIS: Bilateral Symptomatic Macromastia  POST-OP DIAGNOSIS: Same  PROCEDURE:   Right breast scar revision  Revision reduction mammaplasty left breast for breast asymmetry   SURGEON: Lang Shadow, MD  ASSISTANT(S):  None   EBL:100  mL   SPECIMENS: Left Breast Tissue:   315 gms    DRAINS: None    COMPLICATIONS: None   DISPOSITION: Stable to PACU Extubated   HISTORY/INDICATION: The patient is a 53 y.o. female with history of right breast cancer s/p lumpectomy and radiation who underwent prior b/l breast reduction. She subsequently developed asymmetry and ptosis of the left, non-cancerous breast and desires revision for improved symmetry. She was consented for revision of left breast reduction as well as right breast scar revision. I discussed specific risks with the patient including bleeding, pain, infection, scarring, seroma, asymmetry, wound formation, need for dressing changes, need for revisions, loss of nipple, numbness, fat necrosis, cosmetic deformity. She accepts these risks and elected to proceed.    DESCRIPTION OF PROCEDURE: In the Preoperative Area, the patient was marked in the standing position for a Wise-pattern/Keyhole reduction. This included marking of the sternal notch, the breast meridians, the IMFs, new nipple position and limbs for the keyhole incision for a Wise pattern reduction.   The patient was then brought to the Operating Room and placed supine on the operating room table. General anesthesia was induced, and IV antibiotics were given within 30 minutes of incision. A timeout was completed per protocol. The chest was prepped and draped in standard fashion.    The procedure began with confirmation of markings for symmetry. A template was used to mark the new nipple-areolar diameter. An inferior pedicle was designed around the marked NAC.    Beginning on the left, the new NAC was then incised and the pedicle was  de-epithelialized.All markings were incised and bovie was used to dissect along the medial and lateral flaps in the subcutaneous plane down to the chest wall. The pedicle was developed with an inferior base, taking care not to undermine the NAC blood supply from the lower thoracic/chest wall. The remaining keyhole pattern markings were incised and dissected in similar fashion. Superior, medial and lateral flaps were raised, with specific attention given to appropriate thinning of the lateral skin flaps. Specimens were passed off for weighing.    On the right, lateral soft tissue excess including the previous wise pattern lateral/horizontal limb was marked as an ellipse and excised at subcutaneous depth, resulting in improved contour. Hemostasis was ensured and closure performed with running stratafix in the deep dermis and 3-0 monocryl subcuticular suture in the skin.     She was placed in the upright position and the left sided reduction was temporarily closed to compare volume and position symmetry. These were satisfactory; closing commenced after hemostasis was ensured. The nipple areola complex on the left was advanced into it's new position and secured with 3-0 monocryl. A similar stitch was used to approximate the superior points of the vertical limbs. 3 point sutures were used at the inferior point of the vertical limb/IMF junction. Closure was performed in layers. Running 0 stratafix suture was placed in the superficial fascia and deep dermis, followed by running 3-0 monocryl subcuticular  vicryl suture and skin was further approximated with 3-0 monocryl intracuticular suture.    At the conclusion of the case, both NACs appeared pink, viable and with normal turgor. The incisions were dressed with steri strips. Sterile dressings were applied followed by  a surgical bra.   The patient tolerated the procedure well without any complications. Resection specimens were sent as separate specimens for  permanent path.   All instrument, sponge and lap counts were correct at the end of the case.

## 2024-04-06 NOTE — Anesthesia Procedure Notes (Signed)
 Procedure Name: Intubation Date/Time: 04/06/2024 12:31 PM  Performed by: Buster Catheryn SAUNDERS, CRNAPre-anesthesia Checklist: Patient identified, Emergency Drugs available, Suction available and Patient being monitored Patient Re-evaluated:Patient Re-evaluated prior to induction Oxygen Delivery Method: Circle system utilized Preoxygenation: Pre-oxygenation with 100% oxygen Induction Type: IV induction Ventilation: Mask ventilation without difficulty Laryngoscope Size: Miller and 2 Grade View: Grade II Tube type: Oral Tube size: 7.0 mm Number of attempts: 1 Airway Equipment and Method: Stylet and Oral airway Placement Confirmation: ETT inserted through vocal cords under direct vision, positive ETCO2 and breath sounds checked- equal and bilateral Secured at: 22 cm Tube secured with: Tape Dental Injury: Teeth and Oropharynx as per pre-operative assessment

## 2024-04-06 NOTE — Anesthesia Preprocedure Evaluation (Addendum)
 Anesthesia Evaluation  Patient identified by MRN, date of birth, ID band Patient awake    Reviewed: Allergy & Precautions, NPO status , Patient's Chart, lab work & pertinent test results  History of Anesthesia Complications Negative for: history of anesthetic complications  Airway Mallampati: II  TM Distance: >3 FB Neck ROM: Full    Dental  (+) Teeth Intact, Dental Advisory Given   Pulmonary Current Smoker and Patient abstained from smoking.   breath sounds clear to auscultation       Cardiovascular negative cardio ROS  Rhythm:Regular Rate:Normal     Neuro/Psych  Headaches PSYCHIATRIC DISORDERS Anxiety Depression Bipolar Disorder   ADHD on Adderall   GI/Hepatic ,GERD  Medicated and Controlled,,  Endo/Other  negative endocrine ROS    Renal/GU Renal disease     Musculoskeletal  (+) Arthritis ,  Fibromyalgia -  Abdominal   Peds  Hematology   Anesthesia Other Findings GLP-1 agonist (last dose 03/29/24)  Reproductive/Obstetrics Hx of Breast Cancer s/p Lumpectomy (2002) and XRT; s/p Bilateral Breast Reduction (2020)                              Anesthesia Physical Anesthesia Plan  ASA: 2  Anesthesia Plan: General   Post-op Pain Management:    Induction: Intravenous  PONV Risk Score and Plan: 2 and Ondansetron , Dexamethasone , Treatment may vary due to age or medical condition, Scopolamine  patch - Pre-op and Midazolam   Airway Management Planned: Oral ETT  Additional Equipment: None  Intra-op Plan:   Post-operative Plan: Extubation in OR  Informed Consent:      Dental advisory given  Plan Discussed with: CRNA  Anesthesia Plan Comments:          Anesthesia Quick Evaluation

## 2024-04-06 NOTE — Discharge Instructions (Addendum)
 Surgery Discharge Instructions:  1. Call your doctor or go to the emergency room if you have:  - Fever of 101 degrees or higher - Uncontrollable pain or pain that has increased greatly since your surgery - Increased redness around your incision (cut); incision pulling apart; thick, white drainage (pus), bleeding, or a large amount of fluid from your incision. - Nausea and vomiting that continue despite medications - Urine output less than is normal for you  - Chest pain/shortness of breath  2. Wound Care/Dressings/Drain Instructions:   - Do not shower for 24 hours. In the meantime it is OK to clean yourself with soapy washcloth and pat your skin dry. Do not submerge incisions underwater.  - It is OK to leave surgical dressings in place until your post-operative appointment. If the adhesive is bothering your skin, you can carefully removed them after 4-5 days.  - Avoid heavy lifting and overhead activity (this may stretch or pull your incisions), straining/bearing down, or activities that may elevate your blood pressure such as exercise. This will help prevent problems with the incisions and with bleeding, which is rare but can occur.  - Make sure you walk regularly at home to prevent stagnation of blood which can result in a blood clot in your legs. It is important to try to mobilize every 3-4 hours during the day after your surgery.   - Pain medicinehas been prescribed. It is OK to take over the counter pain medicines for pain: tylenol  (up to 1000 mg every 6 hours) or ibuprofen  (600 mg every 6 hours). Alternate these medications every 3 hours (tylenol  at 12 and 6 o'clock, ibuprofen  at 3 and o'clock) for maximum relief.      Activity  Rest and do not return to work until released by your doctor Do not drive or operate machinery until your health care provider says that it is safe. Do not raise your arms above the level of your breasts and keep your elbows in at all times to prevent opening a  wound. Do not lift anything heavier than a gallon of milk.  Do not do physical activity that requires a lot of movement or energy for 1 month after surgery, or as long as told by your health care provider. Do not use any products that contain nicotine or tobacco. These products include cigarettes, chewing tobacco, and vaping devices, such as e-cigarettes. These can delay healing after surgery. If you need help quitting, ask your health care provider. Avoid sun on your incisions for long periods of time. General instructions Wear a postoperative bra. Do not wear a bra that has an underwire. Do not sleep on your belly for 4-6 weeks. Keep all follow-up visits. This is important to monitor healing and to assess or remove drains. Contact a health care provider if you have: Any of these signs of infection: More redness, swelling, or pain around your incision area or in your breast. More fluid or blood coming from your incision area. Your incision or your breast feels warm to the touch. Pus or a bad smell coming from your incision area. A fever.  3. Follow Up:  - A follow up appointment should be scheduled for you for approximately one week after discharge, and our secretaries will be contacting you with the details of that appointment. However, If you do not hear about your appoinment in 3 business days, please call the provided surgery clinic number and confirm/schedule your appointment.    [email or web address]   4. Activity/Restrictions:  -  Resume your regular activities, as tolerated  5. Diet: -  Resume your regular diet, as tolerated  6. Additional Instructions: - Please take an over the counter stool softener while taking narcotic pain medication - DO NOT MIX NARCOTIC PAIN MEDICATIONS OR TAKE NARCOTIC PRESCRIPTIONS AT THE SAME TIME (PERCODET, LORTAB, ROXICODONE , ETC.) - DO NOT DRIVE OR OPERATE HEAVY MACHINERY WHILE ON NARCOTICS  - DO NOT TAKE MORE THAN 4 GRAMS (4000mg ) OF TYLENOL   (ACETAMINOPHEN ) IN 24 HOURS ________________________________________________________________ Department of Plastic Surgery Contact Info: Plastic Surgery Office Contact for daytime hours - [phone] Office/ physician access line after hours [phone]     Post Anesthesia Home Care Instructions  Activity: Get plenty of rest for the remainder of the day. A responsible individual must stay with you for 24 hours following the procedure.  For the next 24 hours, DO NOT: -Drive a car -Advertising copywriter -Drink alcoholic beverages -Take any medication unless instructed by your physician -Make any legal decisions or sign important papers.  Meals: Start with liquid foods such as gelatin or soup. Progress to regular foods as tolerated. Avoid greasy, spicy, heavy foods. If nausea and/or vomiting occur, drink only clear liquids until the nausea and/or vomiting subsides. Call your physician if vomiting continues.  Special Instructions/Symptoms: Your throat may feel dry or sore from the anesthesia or the breathing tube placed in your throat during surgery. If this causes discomfort, gargle with warm salt water . The discomfort should disappear within 24 hours.  If you had a scopolamine  patch placed behind your ear for the management of post- operative nausea and/or vomiting:  1. The medication in the patch is effective for 72 hours, after which it should be removed.  Wrap patch in a tissue and discard in the trash. Wash hands thoroughly with soap and water . 2. You may remove the patch earlier than 72 hours if you experience unpleasant side effects which may include dry mouth, dizziness or visual disturbances. 3. Avoid touching the patch. Wash your hands with soap and water  after contact with the patch.     Information for Discharge Teaching: EXPAREL  (bupivacaine  liposome injectable suspension)   Pain relief is important to your recovery. The goal is to control your pain so you can move easier  and return to your normal activities as soon as possible after your procedure. Your physician may use several types of medicines to manage pain, swelling, and more.  Your surgeon or anesthesiologist gave you EXPAREL (bupivacaine ) to help control your pain after surgery.  EXPAREL  is a local anesthetic designed to release slowly over an extended period of time to provide pain relief by numbing the tissue around the surgical site. EXPAREL  is designed to release pain medication over time and can control pain for up to 72 hours. Depending on how you respond to EXPAREL , you may require less pain medication during your recovery. EXPAREL  can help reduce or eliminate the need for opioids during the first few days after surgery when pain relief is needed the most. EXPAREL  is not an opioid and is not addictive. It does not cause sleepiness or sedation.   Important! A teal colored band has been placed on your arm with the date, time and amount of EXPAREL  you have received. Please leave this armband in place for the full 96 hours following administration, and then you may remove the band. If you return to the hospital for any reason within 96 hours following the administration of EXPAREL , the armband provides important information that your health care  providers to know, and alerts them that you have received this anesthetic.    Possible side effects of EXPAREL : Temporary loss of sensation or ability to move in the area where medication was injected. Nausea, vomiting, constipation Rarely, numbness and tingling in your mouth or lips, lightheadedness, or anxiety may occur. Call your doctor right away if you think you may be experiencing any of these sensations, or if you have other questions regarding possible side effects.  Follow all other discharge instructions given to you by your surgeon or nurse. Eat a healthy diet and drink plenty of water  or other fluids.  Next dose of tylenol  can be taken after 9:30 pm  if needed

## 2024-04-07 ENCOUNTER — Encounter (HOSPITAL_BASED_OUTPATIENT_CLINIC_OR_DEPARTMENT_OTHER): Payer: Self-pay

## 2024-04-08 ENCOUNTER — Ambulatory Visit: Admission: RE | Admit: 2024-04-08 | Discharge: 2024-04-08 | Attending: Urology

## 2024-04-08 DIAGNOSIS — R399 Unspecified symptoms and signs involving the genitourinary system: Secondary | ICD-10-CM | POA: Insufficient documentation

## 2024-04-08 DIAGNOSIS — R3129 Other microscopic hematuria: Secondary | ICD-10-CM | POA: Insufficient documentation

## 2024-04-08 LAB — SURGICAL PATHOLOGY

## 2024-04-08 MED ORDER — IOHEXOL 300 MG/ML  SOLN
100.0000 mL | Freq: Once | INTRAMUSCULAR | Status: AC | PRN
  Administered 2024-04-08: 08:00:00 100 mL via INTRAVENOUS

## 2024-04-08 NOTE — Anesthesia Postprocedure Evaluation (Signed)
 Anesthesia Post Note  Patient: Gwendolyn [name]  Procedure(s) Performed: MAMMOPLASTY, REDUCTION (Left: Breast) REVISION, SCAR (Bilateral: Breast)     Patient location during evaluation: PACU Anesthesia Type: General Level of consciousness: awake Pain management: pain level controlled Vital Signs Assessment: post-procedure vital signs reviewed and stable Respiratory status: spontaneous breathing Cardiovascular status: blood pressure returned to baseline Postop Assessment: no apparent nausea or vomiting and adequate PO intake Anesthetic complications: no   No notable events documented.  Last Vitals:  Vitals:   04/06/24 1545 04/06/24 1612  BP: 108/63 109/65  Pulse: 88 81  Resp: 15 15  Temp:  (!) 36.3 C  SpO2: 97% 96%    Last Pain:  Vitals:   04/06/24 1612  TempSrc: Temporal                 Nachman Sundt T Colhoun

## 2024-04-09 ENCOUNTER — Ambulatory Visit: Payer: Self-pay | Admitting: Internal Medicine

## 2024-04-12 ENCOUNTER — Ambulatory Visit: Payer: Self-pay | Admitting: Urology

## 2024-04-24 ENCOUNTER — Ambulatory Visit: Payer: Self-pay

## 2024-04-24 ENCOUNTER — Other Ambulatory Visit

## 2024-04-24 DIAGNOSIS — R300 Dysuria: Secondary | ICD-10-CM

## 2024-04-24 NOTE — Telephone Encounter (Signed)
 Pt is aware and scheduled for both lab appt and follow up with Dr. Marylynn.

## 2024-04-24 NOTE — Telephone Encounter (Signed)
 FYI Only or Action Required?: Action required by provider: request for appointment, clinical question for provider, update on patient condition, and request for urinalysis nurse visit.  Patient was last seen in primary care on 01/20/2024 by Marylynn Verneita CROME, MD.  Called Nurse Triage reporting Urinary Frequency.  Symptoms began today.  Interventions attempted: Nothing.  Symptoms are: unchanged.  Triage Disposition: See Physician Within 24 Hours  Patient/caregiver understands and will follow disposition?: Yes   Copied from CRM #[id number]. Topic: Clinical - Red Word Triage >> Apr 24, 2024 11:23 AM Rea BROCKS wrote: Red Word that prompted transfer to Nurse Triage: possible uti, lower severe pain  Reason for Disposition  Urinating more frequently than usual (i.e., frequency) OR new-onset of the feeling of an urgent need to urinate (i.e., urgency)  Answer Assessment - Initial Assessment Questions Pt called in to report urinary symptoms of pain and burn with increased frequency. Pt reports symptoms started this morning; no fever, hematuria. Pt is not currently doing any interventions; states that she is at work but could come to clinic for urinalysis. States she did call urologist but they referred her back to PCP. Please advise.    1. SYMPTOM: What's the main symptom you're concerned about? (e.g., frequency, incontinence)     Increased frequency, pain and burning with urination   2. ONSET: When did the  symptoms  start?     Today; this morning  3. PAIN: Is there any pain? If Yes, ask: How bad is it? (Scale: 1-10; mild, moderate, severe)     Yes, pain with sitting and urination   4. CAUSE: What do you think is causing the symptoms?     Suspects UTI   5. OTHER SYMPTOMS: Do you have any other symptoms? (e.g., blood in urine, fever, flank pain, pain with urination)     Pain with urination; denies fever, flank pain or hematuria  Protocols used: Urinary Symptoms-A-AH

## 2024-04-24 NOTE — Telephone Encounter (Signed)
 If okay for pt to drop off urine we will call her and place on lab schedule and schedule for a follow up.  I have pended the orders for approval.

## 2024-04-25 LAB — URINALYSIS, ROUTINE W REFLEX MICROSCOPIC
Bilirubin, UA: NEGATIVE
Glucose, UA: NEGATIVE
Nitrite, UA: NEGATIVE
Urobilinogen, Ur: 1 mg/dL (ref 0.2–1.0)
pH, UA: 6 (ref 5.0–7.5)

## 2024-04-25 LAB — MICROSCOPIC EXAMINATION
Casts: NONE SEEN /LPF
Epithelial Cells (non renal): >10 /HPF — AB (ref 0–10)
RBC, Urine: NONE SEEN /HPF (ref 0–2)

## 2024-04-27 ENCOUNTER — Encounter

## 2024-04-27 ENCOUNTER — Telehealth: Admitting: Internal Medicine

## 2024-04-27 ENCOUNTER — Encounter: Payer: Self-pay | Admitting: Internal Medicine

## 2024-04-27 VITALS — Ht 66.0 in | Wt 160.5 lb

## 2024-04-27 DIAGNOSIS — F4321 Adjustment disorder with depressed mood: Secondary | ICD-10-CM | POA: Diagnosis not present

## 2024-04-27 DIAGNOSIS — Z8489 Family history of other specified conditions: Secondary | ICD-10-CM | POA: Diagnosis not present

## 2024-04-27 DIAGNOSIS — N390 Urinary tract infection, site not specified: Secondary | ICD-10-CM

## 2024-04-27 MED ORDER — SULFAMETHOXAZOLE-TRIMETHOPRIM 800-160 MG PO TABS: 1.0000 | ORAL_TABLET | Freq: Two times a day (BID) | ORAL | 0 refills | Status: DC

## 2024-04-27 NOTE — Assessment & Plan Note (Signed)
 Patient is dealing with the unexpected loss of her husband Lamar in July 2025 and has adequate coping skills and emotional support .

## 2024-04-27 NOTE — Assessment & Plan Note (Addendum)
 Vs interstitial cystitis.  Workup underway .  She has no pyuria on current urinalysis but has dysuria and frequency.  Culture is still pending from Jan 2 ,  empiric Septra  DS given

## 2024-04-27 NOTE — Assessment & Plan Note (Signed)
 X 2 ,  with recent biopsy during breast reduction negative for CA

## 2024-04-27 NOTE — Progress Notes (Signed)
 Virtual Visit via Caregility   Note   This format is felt to be most appropriate for this patient at this time.  All issues noted in this document were discussed and addressed.  No physical exam was performed (except for noted visual exam findings with Video Visits).   I connected with [name]  on 04/27/2024 at  5:00 PM EST by a video enabled telemedicine application and verified that I am speaking with the correct person using two identifiers. Location patient: home Location provider: work or home office Persons participating in the virtual visit: patient, provider  I discussed the limitations, risks, security and privacy concerns of performing an evaluation and management service by telephone and the availability of in person appointments. I also discussed with the patient that there may be a patient responsible charge related to this service. The patient expressed understanding and agreed to proceed.  Reason for visit: urinary frequency and dysuria   HPI:  Gwendolyn [name] presents today for follow up on recent UA/micro/culture obtained on Friday jan 2 after developing dysuria and frequency.  She has had several episodes of  symptoms without proof of infection and is scheduled for cystoscopy in mid January with Dr Lorence after CT of abd and pelvis was non diagnostic.  She is also scheduled same due to see urogynecology  2) breast biopsy : done in mid December after plastic surgery was done electively to reduce the assymmetry of her breasts .  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  She spent the holidays in [place] with her sister and aunt    ROS: See pertinent positives and negatives per HPI.  Past Medical History:  Diagnosis Date   Anal fissure    Anxiety    Arthritis    Breast cancer (HCC) 2002   lumpectomy-Right   Depression    Family  history of brain cancer    Family history of colon cancer    Family history of ductal carcinoma in situ (DCIS) of breast 12/06/2018   Fibromyalgia    Gallstones    GERD (gastroesophageal reflux disease)    Irritable bowel syndrome    Motion sickness    cars, boats   Personal history of radiation therapy    Yeast vaginitis     Past Surgical History:  Procedure Laterality Date   ABDOMINAL HYSTERECTOMY     BREAST BIOPSY Right 2002   Breast cancer DCIS   BREAST BIOPSY     BREAST LUMPECTOMY Right 2003   Radiatiomn Therapy DCIS   BREAST REDUCTION SURGERY Left 04/06/2024   Procedure: MAMMOPLASTY, REDUCTION;  Surgeon: Elfrieda Cadet, MD;  Location: [facility];  Service: Plastics;  Laterality: Left;  *REVISION LEFT BREAST REDUCTION *   CHOLECYSTECTOMY  2000   COLECTOMY  10/31/2012   SECONDARY TO COLONIC INERTIA   COSMETIC SURGERY  November 13, 2018   breast reduction   ESOPHAGOGASTRODUODENOSCOPY (EGD) WITH PROPOFOL  N/A 05/11/2020   Procedure: ESOPHAGOGASTRODUODENOSCOPY (EGD) WITH PROPOFOL ;  Surgeon: Janalyn Keene NOVAK, MD;  Location: [facility];  Service: Endoscopy;  Laterality: N/A;   REDUCTION MAMMAPLASTY Bilateral 11/13/2018   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 during breast reduction   SCAR REVISION Bilateral 04/06/2024   Procedure: REVISION, SCAR;  Surgeon: Elfrieda Cadet, MD;  Location: [facility];  Service: Plastics;  Laterality: Bilateral;  *BILATERAL SCAR REVISION  SHOULDER ARTHROSCOPY WITH DISTAL CLAVICLE RESECTION Right 11/08/2020   Procedure: RIGHT SHOULDER ARTHROSCOPY, ARTHROSCOPIC DISTAL CLAVICLE EXCISION;  Surgeon: Addie Cordella Hamilton, MD;  Location[facility];  Service: Orthopedics;  Laterality: Right;   SUPRACERVICAL ABDOMINAL HYSTERECTOMY  04/10/2011   But still has ovaries.    Family History  Problem Relation Age of Onset   Meniere's disease Father    Alcohol abuse Sister    Heart disease Maternal Grandmother    Heart disease Maternal Grandfather     Brain cancer Maternal Grandfather    Heart disease Paternal Grandmother    Heart disease Paternal Grandfather    Rectal cancer Other        MGF's mother   Colon cancer Neg Hx    Esophageal cancer Neg Hx    Breast cancer Neg Hx     SOCIAL HX:  reports that she has quit smoking. Her smoking use included cigarettes. She has a 10 pack-year smoking history. She has never used smokeless tobacco. She reports current alcohol use. She reports that she does not use drugs.   Current Medications[1]  EXAM:  VITALS per patient if applicable:  GENERAL: alert, oriented, appears well and in no acute distress  HEENT: atraumatic, conjunttiva clear, no obvious abnormalities on inspection of external nose and ears  NECK: normal movements of the head and neck  LUNGS: on inspection no signs of respiratory distress, breathing rate appears normal, no obvious gross SOB, gasping or wheezing  CV: no obvious cyanosis  MS: moves all visible extremities without noticeable abnormality  PSYCH/NEURO: pleasant and cooperative, no obvious depression or anxiety, speech and thought processing grossly intact  ASSESSMENT AND PLAN: Recurrent UTI Assessment & Plan: Vs interstitial cystitis.  Workup underway .  She has no pyuria on current urinalysis but has dysuria and frequency.  Culture is still pending from Jan 2 ,  empiric Septra  DS given     Grief Assessment & Plan: Patient is dealing with the unexpected loss of her husband Lamar in July 2025 and has adequate coping skills and emotional support .     Family history of ductal carcinoma in situ (DCIS) of breast Assessment & Plan: X 2 ,  with recent biopsy during breast reduction negative for CA    Other orders -     Sulfamethoxazole -Trimethoprim ; Take 1 tablet by mouth 2 (two) times daily.  Dispense: 14 tablet; Refill: 0      I discussed the assessment and treatment plan with the patient. The patient was provided an opportunity to ask questions and  all were answered. The patient agreed with the plan and demonstrated an understanding of the instructions.   The patient was advised to call back or seek an in-person evaluation if the symptoms worsen or if the condition fails to improve as anticipated.   I spent 30 minutes dedicated to the care of this patient on the date of this encounter to include pre-visit review of patient's medical history,  including recent ER visit, imaging studies and labs, face-to-face time with the patient , and post visit ordering of testing and therapeutics.    Verneita LITTIE Kettering, MD      [1]  Current Outpatient Medications:    amphetamine -dextroamphetamine  (ADDERALL) 30 MG tablet, Take 1 tablet by mouth 2 (two) times daily., Disp: , Rfl:    asenapine (SAPHRIS) 5 MG SUBL 24 hr tablet, 5 mg at bedtime., Disp: , Rfl:    ASHWAGANDHA PO, Take 3,000 mg by mouth at bedtime., Disp: , Rfl:  Cholecalciferol (VITAMIN D ) 50 MCG (2000 UT) tablet, Take 2,000 Units by mouth daily., Disp: , Rfl:    clotrimazole -betamethasone  (LOTRISONE ) cream, Apply 1 Application topically 2 (two) times daily., Disp: 45 g, Rfl: 1   CRANBERRY PO, Take 30,000 mg by mouth at bedtime., Disp: , Rfl:    Cyanocobalamin (VITAMIN B-12) 5000 MCG TBDP, Take 5,000 mcg by mouth daily., Disp: , Rfl:    DULoxetine  HCl 40 MG CPEP, Take 80 mg by mouth every morning., Disp: , Rfl:    Ferrous Sulfate (IRON PO), Take 25 mg by mouth daily., Disp: , Rfl:    FIBER PO, Take 6 tablets by mouth in the morning and at bedtime., Disp: , Rfl:    GINKGO BILOBA PO, Take 3,000 mg by mouth in the morning and at bedtime., Disp: , Rfl:    Melatonin 10 MG TABS, Take 10 mg by mouth daily., Disp: , Rfl:    metFORMIN  (GLUCOPHAGE -XR) 500 MG 24 hr tablet, TAKE 1 TABLET BY MOUTH EVERY DAY WITH BREAKFAST, Disp: 90 tablet, Rfl: 1   omeprazole  (PRILOSEC) 20 MG capsule, TAKE 1 CAPSULE BY MOUTH EVERY DAY, Disp: 30 capsule, Rfl: 0   ondansetron  (ZOFRAN ) 4 MG tablet, Take 1 tablet (4 mg  total) by mouth every 8 (eight) hours as needed for nausea or vomiting., Disp: 20 tablet, Rfl: 0   Probiotic Product (PROBIOTIC PO), Take 1 capsule by mouth daily., Disp: , Rfl:    sertraline (ZOLOFT) 25 MG tablet, Take by mouth every morning., Disp: , Rfl:    sulfamethoxazole -trimethoprim  (BACTRIM  DS) 800-160 MG tablet, Take 1 tablet by mouth 2 (two) times daily., Disp: 14 tablet, Rfl: 0   tiaGABine (GABITRIL) 4 MG tablet, Take 4-16 mg by mouth See admin instructions. Take 4 mg in the morning and 16 mg at bedtime, Disp: , Rfl:    Tirzepatide (MOUNJARO Jessie), Inject 50 Units into the skin once a week., Disp: , Rfl:    oxyCODONE  (ROXICODONE ) 5 MG immediate release tablet, Take 1 tablet (5 mg total) by mouth every 8 (eight) hours as needed. (Patient not taking: Reported on 04/27/2024), Disp: 24 tablet, Rfl: 0

## 2024-04-28 ENCOUNTER — Ambulatory Visit: Payer: Self-pay | Admitting: Internal Medicine

## 2024-04-29 LAB — URINE CULTURE

## 2024-05-06 ENCOUNTER — Ambulatory Visit: Admitting: Obstetrics

## 2024-05-06 ENCOUNTER — Encounter: Payer: Self-pay | Admitting: Obstetrics

## 2024-05-06 VITALS — BP 102/70 | HR 80 | Ht 66.0 in | Wt 156.0 lb

## 2024-05-06 DIAGNOSIS — N941 Unspecified dyspareunia: Secondary | ICD-10-CM | POA: Diagnosis not present

## 2024-05-06 DIAGNOSIS — Z90710 Acquired absence of both cervix and uterus: Secondary | ICD-10-CM

## 2024-05-06 DIAGNOSIS — R3989 Other symptoms and signs involving the genitourinary system: Secondary | ICD-10-CM

## 2024-05-06 DIAGNOSIS — Z87440 Personal history of urinary (tract) infections: Secondary | ICD-10-CM

## 2024-05-06 DIAGNOSIS — N3010 Interstitial cystitis (chronic) without hematuria: Secondary | ICD-10-CM | POA: Insufficient documentation

## 2024-05-06 DIAGNOSIS — R351 Nocturia: Secondary | ICD-10-CM

## 2024-05-06 DIAGNOSIS — N952 Postmenopausal atrophic vaginitis: Secondary | ICD-10-CM | POA: Insufficient documentation

## 2024-05-06 DIAGNOSIS — R8290 Unspecified abnormal findings in urine: Secondary | ICD-10-CM

## 2024-05-06 DIAGNOSIS — N390 Urinary tract infection, site not specified: Secondary | ICD-10-CM

## 2024-05-06 DIAGNOSIS — N3941 Urge incontinence: Secondary | ICD-10-CM | POA: Diagnosis not present

## 2024-05-06 LAB — POCT URINALYSIS DIP (CLINITEK)
Bilirubin, UA: NEGATIVE
Glucose, UA: NEGATIVE mg/dL
Ketones, POC UA: NEGATIVE mg/dL
Nitrite, UA: NEGATIVE
POC PROTEIN,UA: NEGATIVE
Spec Grav, UA: 1.02
Urobilinogen, UA: 0.2 U/dL
pH, UA: 6.5

## 2024-05-06 MED ORDER — TROSPIUM CHLORIDE ER 60 MG PO CP24: 1.0000 | ORAL_CAPSULE | Freq: Every day | ORAL | 2 refills | Status: AC

## 2024-05-06 NOTE — Patient Instructions (Addendum)
 We discussed the symptoms of overactive bladder (OAB), which include urinary urgency, urinary frequency, night-time urination, with or without urge incontinence.  We discussed management including behavioral therapy (decreasing bladder irritants by following a bladder diet, urge suppression strategies, timed voids, bladder retraining), physical therapy, medication; and for refractory cases posterior tibial nerve stimulation, sacral neuromodulation, and intravesical botulinum toxin injection.   For anticholinergic medications, we discussed the potential side effects of anticholinergics including dry eyes, dry mouth, constipation, rare risks of cognitive impairment and urinary retention. You were given prescription for Trospium  60mg .  It can take a month to start working so give it time, but if you have bothersome side effects call sooner and we can try a different medication.  Call us  if you have trouble filling the prescription or if it's not covered by your insurance.  For night time frequency: - avoid fluid intake 3 hours before bedtime  For irritative bladder we reviewed treatment options including altering her diet to avoid irritative beverages and foods as well as attempting to decrease stress and other exacerbating factors.  We also discussed using pyridium and similar over-the-counter medications for pain relief as needed. We discussed the pentad of medications including Elmiron, Tums, an antihistamine such as Vistaril, amitriptyline, and L-arginine.  We also discussed in-office bladder instillations for pain flares, as well as cystoscopy with hydrodistention in the operating room, which can be both diagnostic and therapeutic. She was also given information on the [employer] Network at https://www.ic-network.com for bladder diet suggestions and patient forums for support.   The origin of pelvic floor muscle spasm can be multifactorial, including primary, reactive to a different pain source, trauma, or even  part of a centralized pain syndrome.Treatment options include pelvic floor physical therapy, local (vaginal) or oral  muscle relaxants, pelvic muscle trigger point injections or centrally acting pain medications.     Please call [phone] to schedule the earliest appointment for pelvic floor PT.  - discussed proper vulvar care, warm compression, avoid pad use, cotton only underwear and barrier ointment if needed  - encouraged Vit E suppository, moisturizer with Replens/Revaree  If you continue to have UTI symptoms, we can start low dose vaginal estrogen  For vaginal atrophy (thinning of the vaginal tissue that can cause dryness and burning) and UTI prevention we discussed estrogen replacement in the form of vaginal cream.   Start vaginal estrogen therapy nightly for two weeks then 2 times weekly at night. This can be placed with your finger or an applicator inside the vagina and around the urethra.  Please let us  know if the prescription is too expensive and we can look for alternative options.   Is vaginal estrogen therapy safe for me? Vaginal estrogen preparations act on the vaginal skin, and only a very tiny amount is absorbed into the bloodstream (0.01%).  They work in a similar way to hand or face cream.  There is minimal absorption and they are therefore perfectly safe. If you have had breast cancer and have persistent troublesome symptoms which aren't settling with vaginal moisturisers and lubricants, local estrogen treatment may be a possibility, but consultation with your oncologist should take place first.

## 2024-05-06 NOTE — Assessment & Plan Note (Addendum)
-   encouraged catheterized urine samples due to frequency bowel movements s/p colectomy - UTI symptoms resolve with pyridium - For treatment of recurrent urinary tract infections, we discussed management of recurrent UTIs including prophylaxis with a daily low dose antibiotic, transvaginal estrogen therapy, D-mannose, and cranberry supplements.  We discussed the role of diagnostic testing such as cystoscopy and upper tract imaging.   - managed by Dr. Francisca - negative CT urogram 04/08/24, pending cystoscopy tomorrow - encouraged to continue pyridium and start NSAID use PRN urinary symptoms - consider low dose vaginal estrogen

## 2024-05-06 NOTE — Progress Notes (Signed)
 " New Patient Evaluation and Consultation  Referring Provider: Marylynn Verneita CROME, MD PCP: Marylynn Verneita CROME, MD Date of Service: 05/06/2024  SUBJECTIVE Chief Complaint: New Patient (Initial Visit) Gwendolyn [name] is a 54 y.o. female here today for urinary incontinece)  History of Present Illness: Gwendolyn [name] is a 54 y.o. White or Caucasian female seen in consultation at the request of Dr Marylynn for evaluation of urgency urinary incontinence.    UTI symptoms: urinary urgency, frequency, dysuria, suprapubic cramping alleviated by heating pad.  Denies hematuria Reports worsening urinary symptoms with frequency/urgency Pyridium and time resolved presumed UTI symptoms.  Denies kidney infection or hospitalization.   History of postcoital recurrent UTI, microscopic hematuria with thin basement membrane disease, dysuria, managed by Dr. Francisca (urology). Underwent negative CT urogram and negative cystoscopy 02/03/18, UTI resolved with cranberry extract and trimpex  suppression. Last seen 03/30/24 and recommended CT urogram and cystoscopy 05/07/24 Started Cefpodoxime  100mg  BID x 7 days and continue macrobid  suppression for 3 months. Reviewed repeat CT and cystoscopy  04/24/24  UA + leuk/heme/ketones/protein, negative UA microscopy with contaminated sample  > 10 epithelial cells, and urine culture 10-25K Klebsiella pneumoniae resistant to augmentin, ampicilin, cefoxitin, macrobid . Rx Septra  by Dr.  Tullo and recommended probiotics. Negative urine cultures 12/24/23, 12/11/23 (<10K growth), 10/22/23, 10/17/23 09/30/23 UA + leuk/protein/heme. Urine culture 25-50K E. Coli intermediate to levaquin . Rx macrobid  09/02/23 UA + leuk/protein/heme/glucose. Urine culture 50-100K E. Coli intermediate to levaquin  and resistant to cipro . Rx Cefdinir  07/29/23 UA + leuk/protein/heme/nit/glucose. Urine culture 50-100K E. Coli resistant to Bactrim . 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  Derinda at [facility] for colonic inertia, denies complications History of pelvic pain since delivery 34 years ago Avoided vaginal estrogen due to history of ER+ breast cancer History of radiation and breast cancer S/p laparoscopic supracervical hysterectomy in 04/10/2011 Mounjaro started around 1 year ago, denies change in bowel movement  Review of records significant for: Thin basement membrane disease, periodic limb disorder, migraine, history of tobacco use, fibromyalgia, bipolar disorder  EXAM: CT UROGRAM 04/08/2024 08:24:22 AM   TECHNIQUE: CT of the abdomen and pelvis was performed before and after the administration of 100 mL of iohexol  (OMNIPAQUE ) 300 MG/ML solution as per CT urogram protocol. Multiplanar reformatted images as well as MIP urogram images are provided for review. Automated exposure control, iterative reconstruction, and/or weight based adjustment of the mA/kV was utilized to reduce the radiation dose to as low as reasonably achievable.   COMPARISON: 04/25/2020   CLINICAL HISTORY: microscopic hematuria   FINDINGS:   LOWER CHEST: Normal heart size. Clear lung bases. Heterogeneous soft tissue density about the lateral left chest wall with minimal gas is likely postoperative in this patient who is status post breast reduction surgery 2 days ago.   LIVER: Focal steatosis adjacent to hepatic falciform ligament. Tiny hepatic cyst. No mass or intrahepatic biliary duct dilatation.   GALLBLADDER AND BILE DUCTS: Cholecystectomy. No biliary ductal dilatation.   SPLEEN: Normal in size and morphology.   PANCREAS: Normal, without duct dilatation or acute inflammation.   ADRENAL GLANDS: Normal, without mass.   KIDNEYS, URETERS AND BLADDER: No renal mass on post-contrast imaging. No hydronephrosis. No hydroureter. No ureteric stone. Moderate renal collecting system opacification on delayed images. Portions of the distal ureters are incompletely opacified. No  filling defects in the opacified collecting systems or ureters. No bladder calculi. No enhancing bladder mass or bladder filling defect on delayed images.   GI AND BOWEL: Normal stomach, without wall  thickening. Normal small bowel caliber. Subtotal colectomy with small bowel to sigmoid anastomosis. Left abdominal encapsulated omental fat included at maximally 5.5 cm at image 42 / 2, smaller than 04/25/2020.   PERITONEUM AND RETROPERITONEUM: No ascites. No free air.   VASCULATURE: Aorta is normal in caliber.   LYMPH NODES: No lymphadenopathy.   REPRODUCTIVE ORGANS: Hysterectomy. No adnexal mass.   BONES AND SOFT TISSUES: No acute osseous abnormality. No focal soft tissue abnormality.   IMPRESSION: 1. No acute finding or explanation for hematuria. 2. Subtotal colectomy. 3. Anterior left abdominal omental encapsulated fat is smaller and more well circumscribed today, favoring remote omental infarct. 4. Heterogeneous soft tissue density about the lateral left chest wall with minimal gas is likely postoperative in this patient who is status post breast reduction surgery 2 days ago.   Electronically signed by: Rockey Kilts MD 04/12/2024 01:29 PM EST RP Workstation: [id number]  Urinary Symptoms: Leaks urine with with movement to the bathroom and with urgency started 1 year ago Leaks 0-1 time(s) per days.  Denies Pad use   Patient is bothered by UI symptoms.  Day time voids 8.  Nocturia: 1-2 times per night to void with insomnia and somnambulism  Started 15 years ago Drinks herbal tea 30 mins ago Denies OSA with history of snoring Denies leg swelling Voiding dysfunction:  does not empty bladder well.  Patient does not use a catheter to empty bladder.  When urinating, patient feels she has no difficulties Drinks: 48oz water  per day, 10-12oz coffee/day, 10-12oz herbal tea  UTIs: 0 UTI's in the last year.  Reports UTI symptoms Denies history of kidney or bladder stones,  pyelonephritis, bladder cancer, and kidney cancer Microscopic hematuria managed by Dr. Francisca No results found for the last 90 days.   Pelvic Organ Prolapse Symptoms:                  Patient Denies a feeling of a bulge the vaginal area.   Bowel Symptom: Bowel movements: 6-8 time(s) per day Stool consistency: loose Straining: no.  Splinting: no.  Incomplete evacuation: no.  Patient Denies accidental bowel leakage / fecal incontinence Bowel regimen: fiber 12 capsules/day Last colonoscopy: prior to surgery in mid 40s, Results unable for review 10/14/17 flexible sigmoidoscopy with posterior and L lateral anal fissure, internal hemorrhoids HM Colonoscopy   This patient has no relevant Health Maintenance data.     Sexual Function Sexually active: yes.  Sexual orientation: Straight Pain with sex: Yes, deep in the pelvis since delivery 34 years ago  Pelvic Pain Admits to pelvic pain Location: deep in pelvis Pain occurs: during and after sex Prior pain treatment: lubrication Improved by: lubrication Worsened by: none   Past Medical History:  Past Medical History:  Diagnosis Date   Anal fissure    Anxiety    Arthritis    Breast cancer (HCC) 2002   lumpectomy-Right   Depression    Family history of brain cancer    Family history of colon cancer    Family history of ductal carcinoma in situ (DCIS) of breast 12/06/2018   Fibromyalgia    Gallstones    GERD (gastroesophageal reflux disease)    Irritable bowel syndrome    Motion sickness    cars, boats   Personal history of radiation therapy    Yeast vaginitis      Past Surgical History:   Past Surgical History:  Procedure Laterality Date   ABDOMINAL HYSTERECTOMY     BREAST BIOPSY Right 2002  Breast cancer DCIS   BREAST BIOPSY     BREAST LUMPECTOMY Right 2003   Radiatiomn Therapy DCIS   BREAST REDUCTION SURGERY Left 04/06/2024   Procedure: MAMMOPLASTY, REDUCTION;  Surgeon: Elfrieda Cadet, MD;  Location: [facility];  Service: Plastics;  Laterality: Left;  *REVISION LEFT BREAST REDUCTION *   CHOLECYSTECTOMY  2000   COLECTOMY  10/31/2012   SECONDARY TO COLONIC INERTIA   COSMETIC SURGERY  November 13, 2018   breast reduction   ESOPHAGOGASTRODUODENOSCOPY (EGD) WITH PROPOFOL  N/A 05/11/2020   Procedure: ESOPHAGOGASTRODUODENOSCOPY (EGD) WITH PROPOFOL ;  Surgeon: Janalyn Keene NOVAK, MD;  Location: [facility];  Service: Endoscopy;  Laterality: N/A;   REDUCTION MAMMAPLASTY Bilateral 11/13/2018   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 during breast reduction   SCAR REVISION Bilateral 04/06/2024   Procedure: REVISION, SCAR;  Surgeon: Elfrieda Cadet, MD;  Location: [facility];  Service: Plastics;  Laterality: Bilateral;  *BILATERAL SCAR REVISION   SHOULDER ARTHROSCOPY WITH DISTAL CLAVICLE RESECTION Right 11/08/2020   Procedure: RIGHT SHOULDER ARTHROSCOPY, ARTHROSCOPIC DISTAL CLAVICLE EXCISION;  Surgeon: Addie Cordella Hamilton, MD;  Location: [facility];  Service: Orthopedics;  Laterality: Right;   SUPRACERVICAL ABDOMINAL HYSTERECTOMY  04/10/2011   But still has ovaries.     Past OB/GYN History: OB History  Gravida Para Term Preterm AB Living  3 1 1  2 1   SAB IAB Ectopic Multiple Live Births  2    1    # Outcome Date GA Lbr Len/2nd Weight Sex Type Anes PTL Lv  3 SAB           2 Term     F Vag-Forceps   LIV  1 SAB             Vaginal deliveries: 7lb11oz, perineal laceration repaired without complication. Forceps/ Vacuum deliveries: 1, Cesarean section: 0 Menopausal: Yes, at age [age], Denies vaginal bleeding since menopause Contraception: s/p hysterectomy. Last pap smear.  Any history of abnormal pap smears: no.    Component Value Date/Time   DIAGPAP  01/15/2023 1411    - Negative for intraepithelial lesion or malignancy (NILM)   DIAGPAP  01/08/2022 1033    - Negative for intraepithelial lesion or malignancy (NILM)   DIAGPAP  01/06/2021 0941    - Negative for Intraepithelial Lesions or  Malignancy (NILM)   DIAGPAP - Benign reactive/reparative changes 01/06/2021 0941   HPVHIGH Negative 01/15/2023 1411   HPVHIGH Negative 01/08/2022 1033   HPVHIGH Negative 01/06/2021 0941   ADEQPAP Satisfactory for evaluation. 01/15/2023 1411   ADEQPAP Satisfactory for evaluation. 01/08/2022 1033   ADEQPAP  01/06/2021 0941    Satisfactory for evaluation; transformation zone component PRESENT.    Medications: Patient has a current medication list which includes the following prescription(s): amphetamine -dextroamphetamine , asenapine, ashwagandha, vitamin d , clotrimazole -betamethasone , cranberry, vitamin b-12, duloxetine  hcl, ferrous sulfate, fiber, ginkgo biloba, melatonin, metformin , omeprazole , ondansetron , probiotic product, sertraline, tiagabine, tirzepatide, and trospium  chloride.   Allergies: Patient is allergic to amoxicillin-pot clavulanate, ciprofloxacin , morphine, and penicillins.   Social History: Social History[1]  Relationship status: widowed Patient lives alone.   Patient is employed as a runner, broadcasting/film/video. Regular exercise: Yes: dance classes History of abuse: No  Family History:   Family History  Problem Relation Age of Onset   Meniere's disease Father    Alcohol abuse Sister    Heart disease Maternal Grandmother    Heart disease Maternal Grandfather    Brain cancer Maternal Grandfather    Heart disease Paternal Grandmother    Heart  disease Paternal Grandfather    Rectal cancer Other        MGF's mother   Colon cancer Neg Hx    Esophageal cancer Neg Hx    Breast cancer Neg Hx    Bladder Cancer Neg Hx    Renal cancer Neg Hx    Uterine cancer Neg Hx      Review of Systems: Review of Systems  Constitutional:  Negative for fever, malaise/fatigue and weight loss.  Respiratory:  Negative for cough, shortness of breath and wheezing.   Cardiovascular:  Negative for chest pain, palpitations and leg swelling.  Gastrointestinal:  Negative for abdominal pain, blood in stool and  constipation.  Genitourinary:  Positive for dysuria, frequency and urgency. Negative for hematuria.       Leakage  Skin:  Negative for rash.  Neurological:  Negative for dizziness, weakness and headaches.  Endo/Heme/Allergies:  Does not bruise/bleed easily.       Hot flashes  Psychiatric/Behavioral:  Negative for depression. The patient is not nervous/anxious.      OBJECTIVE Physical Exam: Vitals:   05/06/24 0835  BP: 102/70  Pulse: 80  Weight: 156 lb (70.8 kg)  Height: 5' 6 (1.676 m)    Physical Exam Constitutional:      General: She is not in acute distress.    Appearance: Normal appearance.  Genitourinary:     Bladder and urethral meatus normal.     No lesions in the vagina.     Right Labia: No rash, tenderness, lesions, skin changes or Bartholin's cyst.    Left Labia: No tenderness, lesions, skin changes, Bartholin's cyst or rash.    No vaginal discharge, erythema, tenderness, bleeding, ulceration or granulation tissue.     No vaginal prolapse present.    Severe vaginal atrophy present.     Right Adnexa: not tender, not full and no mass present.    Left Adnexa: not tender, not full and no mass present.    Cervix is absent.     Uterus is absent.     Urethral meatus caruncle not present.    No urethral prolapse, tenderness, mass, hypermobility, discharge or stress urinary incontinence with cough stress test present.     Bladder is not tender, urgency on palpation not present and masses not present.      Pelvic Floor: Levator muscle strength is 3/5.    Levator ani is tender (bilateral) and obturator internus is tender (bilateral).     No asymmetrical contractions present and no pelvic spasms present.    Symmetrical pelvic sensation, anal wink present and BC reflex present. Cardiovascular:     Rate and Rhythm: Normal rate.  Pulmonary:     Effort: Pulmonary effort is normal. No respiratory distress.  Abdominal:     General: There is no distension.     Palpations:  Abdomen is soft. There is no mass.     Tenderness: There is no abdominal tenderness.     Hernia: No hernia is present.   Neurological:     Mental Status: She is alert.  Vitals reviewed. Exam conducted with a chaperone present.      POP-Q:   POP-Q  -3                                            Aa   -3  Ba  -9                                              C   2                                            Gh  3                                            Pb  9                                            tvl   -3                                            Ap  -3                                            Bp  -9                                              D    Post-Void Residual (PVR) by Bladder Scan: In order to evaluate bladder emptying, we discussed obtaining a postvoid residual and patient agreed to this procedure.  Procedure: The ultrasound unit was placed on the patient's abdomen in the suprapubic region after the patient had voided.    Post Void Residual - 05/06/24 0841       Post Void Residual   Post Void Residual 41 mL         Straight Catheterization Procedure for PVR: After verbal consent was obtained from the patient for catheterization to assess bladder emptying and residual volume the urethra and surrounding tissues were prepped with betadine  and an in and out catheterization was performed.  PVR was 20mL.  Urine appeared clear yellow. The patient tolerated the procedure well.   Laboratory Results: Lab Results  Component Value Date   COLORU yellow 05/06/2024   CLARITYU clear 05/06/2024   GLUCOSEUR negative 05/06/2024   BILIRUBINUR negative 05/06/2024   KETONESU Trace (A) 04/24/2024   SPECGRAV 1.020 05/06/2024   RBCUR trace-intact (A) 05/06/2024   PHUR 6.5 05/06/2024   PROTEINUR 1+ (A) 04/24/2024   UROBILINOGEN 0.2 05/06/2024   LEUKOCYTESUR Small (1+) (A) 05/06/2024    Lab Results  Component Value  Date   CREATININE 0.74 02/19/2024   CREATININE 0.90 01/15/2023   CREATININE 0.94 11/27/2021    Lab Results  Component Value Date   HGBA1C 5.4 01/15/2023    Lab Results  Component Value Date   HGB 12.9 02/19/2024     ASSESSMENT AND PLAN Gwendolyn [name] is a 54 y.o. with:  1. Recurrent UTI   2. Urge urinary incontinence   3. Nocturia   4. Dyspareunia, female   5. Bladder pain syndrome   6. Abnormal urinalysis   7. Vaginal atrophy     Recurrent UTI Assessment & Plan: - encouraged catheterized urine samples due to frequency bowel movements s/p colectomy - UTI symptoms resolve with pyridium - For treatment of recurrent urinary tract infections, we discussed management of recurrent UTIs including prophylaxis with a daily low dose antibiotic, transvaginal estrogen therapy, D-mannose, and cranberry supplements.  We discussed the role of diagnostic testing such as cystoscopy and upper tract imaging.   - managed by Dr. Francisca - negative CT urogram 04/08/24, pending cystoscopy tomorrow - encouraged to continue pyridium and start NSAID use PRN urinary symptoms - consider low dose vaginal estrogen   Urge urinary incontinence Assessment & Plan: - POCT UA +leuk/protein/heme/ketones, PVR 41mL - We discussed the symptoms of overactive bladder (OAB), which include urinary urgency, urinary frequency, nocturia, with or without urge incontinence.  While we do not know the exact etiology of OAB, several treatment options exist. We discussed management including behavioral therapy (decreasing bladder irritants, urge suppression strategies, timed voids, bladder retraining), physical therapy, medication; for refractory cases posterior tibial nerve stimulation, sacral neuromodulation, and intravesical botulinum toxin injection.  For anticholinergic medications, we discussed the potential side effects of anticholinergics including dry eyes, dry mouth, constipation, cognitive impairment and urinary  retention. For Beta-3 agonist medication, we discussed the potential side effect of elevated blood pressure which is more likely to occur in individuals with uncontrolled hypertension. - encouraged fluid management - referral to pelvic floor PT - Rx for trial of Trospium    Orders: -     POCT URINALYSIS DIP (CLINITEK) -     Trospium  Chloride ER; Take 1 capsule (60 mg total) by mouth daily.  Dispense: 30 capsule; Refill: 2 -     AMB referral to rehabilitation  Nocturia Assessment & Plan: - avoid fluid intake 3 hours before bedtime - due to snoring, consider sleep study to rule out sleep apnea   Orders: -     POCT URINALYSIS DIP (CLINITEK) -     Trospium  Chloride ER; Take 1 capsule (60 mg total) by mouth daily.  Dispense: 30 capsule; Refill: 2 -     AMB referral to rehabilitation  Dyspareunia, female Assessment & Plan: - pain with deep penetration - pain with palpation of pelvic floor muscles bilaterally on exam, history of pelvic surgery and colonic insertia, fibromyalgia - The origin of pelvic floor muscle spasm can be multifactorial, including primary, reactive to a different pain source, trauma, or even part of a centralized pain syndrome.Treatment options include pelvic floor physical therapy, local (vaginal) or oral  muscle relaxants, pelvic muscle trigger point injections or centrally acting pain medications.   - referral to pelvic floor PT  Orders: -     AMB referral to rehabilitation  Bladder pain syndrome Assessment & Plan: - For irritative bladder we reviewed treatment options including altering her diet to avoid irritative beverages and foods as well as attempting to decrease stress and other exacerbating factors.  We also discussed using pyridium and similar over-the-counter medications for pain relief as needed. We discussed the pentad of medications including Tums, an antihistamine such as Vistaril, amitriptyline, and L-arginine.  We also discussed in-office bladder  instillations for pain flares, as well as cystoscopy with hydrodistention in the operating room, which can be both diagnostic and therapeutic.  - referral to pelvic floor  PT - encouraged NSAIDs and pyridium PRN lower urinary tract symptoms   Abnormal urinalysis Assessment & Plan: - POCT UA + leuk/protein/heme/ketone - pending cystoscopy with Dr. Francisca tomorrow - negative CT urogram 04/08/24 - likely due to thin basement membrane - consider low dose vaginal estrogen for atrophy   Vaginal atrophy Assessment & Plan: - For symptomatic vaginal atrophy options include lubrication with a water -based lubricant, personal hygiene measures and barrier protection against wetness, and estrogen replacement in the form of vaginal cream, vaginal tablets, or a time-released vaginal ring.   - previously avoided vaginal estrogen due to h/o breast cancer - discussed proper vulvar care, warm compression, avoid pad use, cotton only underwear and barrier ointment if needed  - encouraged Vit E suppository, moisturizer with Replens/Revaree - We discussed the potential risks associated with hormone replacement including stroke, heart attack, and blood clots; and the fact that these risks are very low with vaginal estrogen use due to the very low systemic absorption rate of ~ 0.01% with a twice-week regimen. - consider low dose vaginal estrogen if persistent/refractory symptoms   Time spent: I spent 50 minutes dedicated to the care of this patient on the date of this encounter to include pre-visit review of records, face-to-face time with the patient discussing urgency urinary incontinence, bladder pain syndrome, recurrent UTI, abnormal UA, vaginal atrophy, and post visit documentation and ordering medication/ testing.   Gwendolyn ONEIDA Gillis, MD         [1]  Social History Tobacco Use   Smoking status: Former    Current packs/day: 0.50    Average packs/day: 0.5 packs/day for 20.0 years (10.0 ttl pk-yrs)     Types: Cigarettes   Smokeless tobacco: Never   Tobacco comments:    smoked 10 yrs, quit 20 yrs, smoked last 4 yrs  Vaping Use   Vaping status: Never Used  Substance Use Topics   Alcohol use: Yes    Alcohol/week: 0.0 standard drinks of alcohol    Comment: OCCASIONALLY   Drug use: No   "

## 2024-05-06 NOTE — Assessment & Plan Note (Signed)
-   For symptomatic vaginal atrophy options include lubrication with a water -based lubricant, personal hygiene measures and barrier protection against wetness, and estrogen replacement in the form of vaginal cream, vaginal tablets, or a time-released vaginal ring.   - previously avoided vaginal estrogen due to h/o breast cancer - discussed proper vulvar care, warm compression, avoid pad use, cotton only underwear and barrier ointment if needed  - encouraged Vit E suppository, moisturizer with Replens/Revaree - We discussed the potential risks associated with hormone replacement including stroke, heart attack, and blood clots; and the fact that these risks are very low with vaginal estrogen use due to the very low systemic absorption rate of ~ 0.01% with a twice-week regimen. - consider low dose vaginal estrogen if persistent/refractory symptoms

## 2024-05-06 NOTE — Assessment & Plan Note (Addendum)
-   pain with deep penetration - pain with palpation of pelvic floor muscles bilaterally on exam, history of pelvic surgery and colonic insertia, fibromyalgia - The origin of pelvic floor muscle spasm can be multifactorial, including primary, reactive to a different pain source, trauma, or even part of a centralized pain syndrome.Treatment options include pelvic floor physical therapy, local (vaginal) or oral  muscle relaxants, pelvic muscle trigger point injections or centrally acting pain medications.   - referral to pelvic floor PT

## 2024-05-06 NOTE — Assessment & Plan Note (Signed)
-   POCT UA + leuk/protein/heme/ketone - pending cystoscopy with Dr. Francisca tomorrow - negative CT urogram 04/08/24 - likely due to thin basement membrane - consider low dose vaginal estrogen for atrophy

## 2024-05-06 NOTE — Assessment & Plan Note (Signed)
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  We also discussed using pyridium and similar over-the-counter medications for pain relief as needed. We discussed the pentad of medications including Tums, an antihistamine such as Vistaril, amitriptyline, and L-arginine.  We also discussed in-office bladder instillations for pain flares, as well as cystoscopy with hydrodistention in the operating room, which can be both diagnostic and therapeutic.  - referral to pelvic floor PT - encouraged NSAIDs and pyridium PRN lower urinary tract symptoms

## 2024-05-06 NOTE — Assessment & Plan Note (Signed)
-   POCT UA +leuk/protein/heme/ketones, PVR 41mL - We discussed the symptoms of overactive bladder (OAB), which include urinary urgency, urinary frequency, nocturia, with or without urge incontinence.  While we do not know the exact etiology of OAB, several treatment options exist. We discussed management including behavioral therapy (decreasing bladder irritants, urge suppression strategies, timed voids, bladder retraining), physical therapy, medication; for refractory cases posterior tibial nerve stimulation, sacral neuromodulation, and intravesical botulinum toxin injection.  For anticholinergic medications, we discussed the potential side effects of anticholinergics including dry eyes, dry mouth, constipation, cognitive impairment and urinary retention. For Beta-3 agonist medication, we discussed the potential side effect of elevated blood pressure which is more likely to occur in individuals with uncontrolled hypertension. - encouraged fluid management - referral to pelvic floor PT - Rx for trial of Trospium

## 2024-05-06 NOTE — Assessment & Plan Note (Addendum)
-   avoid fluid intake 3 hours before bedtime - due to snoring, consider sleep study to rule out sleep apnea

## 2024-05-07 ENCOUNTER — Ambulatory Visit: Admitting: Urology

## 2024-05-07 VITALS — BP 105/76 | HR 130

## 2024-05-07 DIAGNOSIS — R3129 Other microscopic hematuria: Secondary | ICD-10-CM

## 2024-05-07 DIAGNOSIS — R399 Unspecified symptoms and signs involving the genitourinary system: Secondary | ICD-10-CM | POA: Diagnosis not present

## 2024-05-07 MED ORDER — LIDOCAINE HCL URETHRAL/MUCOSAL 2 % EX GEL
1.0000 | Freq: Once | CUTANEOUS | Status: AC
  Administered 2024-05-07: 1 via URETHRAL

## 2024-05-07 MED ORDER — SULFAMETHOXAZOLE-TRIMETHOPRIM 800-160 MG PO TABS
1.0000 | ORAL_TABLET | Freq: Once | ORAL | Status: AC
  Administered 2024-05-07: 1 via ORAL

## 2024-05-07 NOTE — Patient Instructions (Addendum)
 "   Eating Plan for Interstitial Cystitis Interstitial cystitis (IC) is a long-term (chronic) condition that can cause pain and pressure near your bladder. It can also make you have to pee urgently and often. Symptoms may come and go. Certain foods may trigger your symptoms. Learning what foods bother you can help you come up with an eating plan to manage IC. What are tips for following this plan? You may want to work with an expert in healthy eating called a dietitian. They can help you make an eating plan by doing an elimination diet. This diet involves: Making a list of foods that you think trigger your symptoms. You may also want to include foods that often cause symptoms in other people with IC. It may take a few months to find out which foods bother you. Taking those foods out of your diet for about a month. After that month, you can try to have the foods again one at a time to see which ones cause symptoms. Reading food labels Once you know which foods trigger your symptoms, you can avoid them. But it's still a good idea to read food labels. Some foods that cause your symptoms may be ingredients in other foods. These foods may include: Soy. Worcestershire sauce. Vinegar. Alcohol. Artificial sweeteners. Monosodium glutamate. Other triggers may include: Chili peppers. Tomato products. Citrus fruits, flavors, or juices. Shopping Shopping can be hard if many foods trigger your IC. Bring a list of the foods you can't eat with you when you go to the store. You can get an app for your phone that lets you know which foods are safer and which ones you may want to avoid. You can find the app at the [employer] website: ic-network.com Meal planning Plan your meals based on the results of your elimination diet. If you haven't done the diet yet, plan meals based on IC food lists. These lists may be given to you by your health care provider or dietitian. The lists tell you which foods are least and  most likely to cause symptoms. Avoid certain types of food when you go out to eat. These may include: Pizza. Indian food. Mexican food. Thai food. General information Do not eat large portions. Drink lots of fluids with your meals. Do not eat foods that are high in sugar, salt, or saturated fat. Choose whole fruits instead of juice. Eat a colorful variety of vegetables. Find ways to manage stress. Get enough exercise. What foods should I eat? For people with IC, the best diet is a balanced one. This means it includes things from all the food groups. Even if you have to avoid certain foods, there are still lots of healthy choices in each group. Below are some foods that may be safest for you to eat: Fruits Bananas. Blueberries and blueberry juice. Melons. Pears. Apples. Dates. Prunes. Raisins. Apricots. Vegetables Asparagus. Avocado. Celery. Beets. Bell peppers. Black olives. Broccoli. Brussels sprouts. Cabbage. Carrots. Cauliflower. Cucumber. Eggplant. Green beans. Potatoes. Radishes. Spinach. Squash. Turnips. Zucchini. Mushrooms. Peas. Grains Oats. Rice. Bran. Oatmeal. Whole wheat bread. Meats and other proteins Beef. Fish and other seafood. Eggs. Nuts. Peanut butter. Pork. Poultry. Lamb. Garbanzo beans. Pinto beans. Dairy Whole or low-fat milk. American, mozzarella, mild cheddar, feta, ricotta, and cream cheeses. The items listed above may not be all the foods and drinks you can have. Talk to a dietitian to learn more. What foods should I avoid? You should avoid any foods that cause symptoms. It's also a good  idea to avoid foods that often cause symptoms in many people with IC. These foods include: Fruits Citrus fruits, such as lemons, limes, oranges, and grapefruit. Cranberries. Strawberries. Pineapple. Kiwi. Vegetables Chili peppers. Onions. Sauerkraut. Tomato and tomato products. Dene. Grains You don't need to avoid any type of grain unless it causes symptoms. Meats and  other proteins Precooked or cured meats, such as sausages or meat loaves. Soy products. Dairy Chocolate ice cream. Processed cheese. Yogurt. Drinks Alcohol. Chocolate drinks. Coffee. Cranberry juice. Fizzy drinks. Black, green, or herbal tea. Tomato juice. Sports drinks. The items listed above may not be all the foods and drinks you should avoid. Talk to a dietitian to learn more. This information is not intended to replace advice given to you by your health care provider. Make sure you discuss any questions you have with your health care provider. Pelvic Floor Dysfunction, Female  Pelvic floor dysfunction (PFD) is a condition that results when the group of muscles and connective tissues that support the organs in the pelvis (pelvic floor muscles) do not work well. These muscles and their connections form a sling that supports the colon and bladder. In women, they also support the uterus. PFD causes pelvic floor muscles to be too weak, too tight, or both. In PFD, muscle movements are not coordinated. This may cause bowel or bladder problems. It may also cause pain. What are the causes? This condition may be caused by an injury to the pelvic area or by a weakening of pelvic muscles. This often results from pregnancy and childbirth or other types of strain. In many cases, the exact cause is not known. What increases the risk? The following factors may make you more likely to develop this condition: Having chronic bladder tissue inflammation (interstitial cystitis). Being an older person. Being overweight. History of radiation treatment for cancer in the pelvic region. Previous pelvic surgery, such as removal of the uterus (hysterectomy). What are the signs or symptoms? Symptoms of this condition vary and may include: Bladder symptoms, such as: Trouble starting urination and emptying the bladder. Frequent urinary tract infections. Leaking urine when coughing, laughing, or exercising (stress  incontinence). Having to pass urine urgently or frequently. Pain when passing urine. Bowel symptoms, such as: Constipation. Urgent or frequent bowel movements. Incomplete bowel movements. Painful bowel movements. Leaking stool or gas. Unexplained genital or rectal pain. Genital or rectal muscle spasms. Low back pain. Other symptoms may include: A heavy, full, or aching feeling in the vagina. A bulge that protrudes into the vagina. Pain during or after sex. How is this diagnosed? This condition may be diagnosed based on: Your symptoms and medical history. A physical exam. During the exam, your health care provider may check your pelvic muscles for tightness, spasm, pain, or weakness. This may include a rectal exam and a pelvic exam. In some cases, you may have diagnostic tests, such as: Electrical muscle function tests. Urine flow testing. X-ray tests of bowel function. Ultrasound of the pelvic organs. How is this treated? Treatment for this condition depends on the symptoms. Treatment options include: Physical therapy. This may include Kegel exercises to help relax or strengthen the pelvic floor muscles. Biofeedback. This type of therapy provides feedback on how tight your pelvic floor muscles are so that you can learn to control them. Internal or external massage therapy. A treatment that involves electrical stimulation of the pelvic floor muscles to help control pain (transcutaneous electrical nerve stimulation, or TENS). Sound wave therapy (ultrasound) to reduce muscle spasms. Medicines,  such as: Muscle relaxants. Bladder control medicines. Surgery to reconstruct or support pelvic floor muscles may be an option if other treatments do not help. Follow these instructions at home: Activity Do your usual activities as told by your health care provider. Ask your health care provider if you should modify any activities. Do pelvic floor strengthening or relaxing exercises at home  as told by your physical therapist. Lifestyle Maintain a healthy weight. Eat foods that are high in fiber, such as beans, whole grains, and fresh fruits and vegetables. Limit foods that are high in fat and processed sugars, such as fried or sweet foods. Manage stress with relaxation techniques such as yoga or meditation. General instructions If you have problems with leakage: Use absorbable pads or wear padded underwear. Wash frequently with mild soap. Keep your genital and anal area as clean and dry as possible. Ask your health care provider if you should try a barrier cream to prevent skin irritation. Take warm baths to relieve pelvic muscle tension or spasms. Take over-the-counter and prescription medicines only as told by your health care provider. Keep all follow-up visits. How is this prevented? The cause of PFD is not always known, but there are a few things you can do to reduce the risk of developing this condition, including: Staying at a healthy weight. Getting regular exercise. Managing stress. Contact a health care provider if: Your symptoms are not improving with home care. You have signs or symptoms of PFD that get worse at home. You develop new signs or symptoms. You have signs of a urinary tract infection, such as: Fever. Chills. Increased urinary frequency. A burning feeling when urinating. You have not had a bowel movement in 3 days (constipation). Summary Pelvic floor dysfunction results when the muscles and connective tissues in your pelvic floor do not work well. These muscles and their connections form a sling that supports your colon and bladder. In women, they also support the uterus. PFD may be caused by an injury to the pelvic area or by a weakening of pelvic muscles. PFD causes pelvic floor muscles to be too weak, too tight, or a combination of both. Symptoms may vary from person to person. In most cases, PFD can be treated with physical therapies and  medicines. Surgery may be an option if other treatments do not help. This information is not intended to replace advice given to you by your health care provider. Make sure you discuss any questions you have with your health care provider. Document Revised: 08/17/2020 Document Reviewed: 08/17/2020 [employer] Patient Education  2024 [employer]   Amitriptyline is a medication that sometimes can be helpful for pelvic floor dysfunction/interstitial cystitis patients, you can discuss with your psychiatrist if they feel this would be safe to add to your current medications.  We typically started a low-dose of 25 or 50 mg daily "

## 2024-05-07 NOTE — Progress Notes (Signed)
 Cystoscopy Procedure Note:  Indication: Microscopic hematuria  Negative microscopic hematuria workup in 2019, thin basement membranes on nephrology biopsy.  Recurrent UTIs.  Possible pelvic floor dysfunction/interstitial cystitis.  Recently established with urogyn 05/06/2024.    After informed consent and discussion of the procedure and its risks, Gwendolyn [name] was positioned and prepped in the standard fashion. Cystoscopy was performed with a flexible cystoscope. The urethra, bladder neck and entire bladder was visualized in a standard fashion. The ureteral orifices were visualized in their normal location and orientation.  Bladder grossly normal throughout.  Imaging: CT urogram benign  Findings: Normal cystoscopy  ------------------------------------------------------------------  Assessment and Plan: Microscopic hematuria with negative workup in 2019, most recently today.  Has had challenges with recurrent UTIs as well as pelvic pain and urinary symptoms of unclear etiology. We discussed the complexities of pelvic pain and possible range of etiologies including pelvic floor dysfunction, chronic bladder pain syndrome, and interstitial cystitis.  We reviewed the AUA guidelines that recommend an algorithmic approach to treatment for these patients, and that a trial of different medications and strategies is sometimes needed to find the approach that works best for each patient's unique situation.  I reinforced the importance of stress management, relaxation, avoiding triggers, and pain management in the approach to pelvic pain.  Agree with referral to pelvic floor physical therapy Could consider amitriptyline 25 to 50 mg nightly in the future if cleared by psychiatry with her duloxetine  Extensive patient information provided today RTC 3 months symptom check  [name], MD 05/07/2024

## 2024-07-14 ENCOUNTER — Ambulatory Visit: Admitting: Physical Therapy

## 2024-08-05 ENCOUNTER — Ambulatory Visit: Admitting: Urology

## 2024-08-07 ENCOUNTER — Ambulatory Visit: Admitting: Obstetrics
